# Patient Record
Sex: Female | Born: 1945
Health system: Southern US, Community
[De-identification: ages and names within clinical notes are randomized; demographics above are authoritative.]

## PROBLEM LIST (undated history)

## (undated) DIAGNOSIS — I639 Cerebral infarction, unspecified: Secondary | ICD-10-CM

## (undated) DIAGNOSIS — H3530 Unspecified macular degeneration: Secondary | ICD-10-CM

## (undated) DIAGNOSIS — D649 Anemia, unspecified: Secondary | ICD-10-CM

## (undated) DIAGNOSIS — I10 Essential (primary) hypertension: Secondary | ICD-10-CM

## (undated) DIAGNOSIS — I509 Heart failure, unspecified: Secondary | ICD-10-CM

## (undated) DIAGNOSIS — E876 Hypokalemia: Secondary | ICD-10-CM

## (undated) HISTORY — PX: CHOLECYSTECTOMY: SHX55

## (undated) HISTORY — PX: REFRACTIVE SURGERY: SHX103

## (undated) HISTORY — PX: EYE SURGERY: SHX253

## (undated) HISTORY — PX: ROTATOR CUFF REPAIR: SHX139

---

## 1999-01-27 ENCOUNTER — Emergency Department (HOSPITAL_COMMUNITY): Admission: EM | Admit: 1999-01-27 | Discharge: 1999-01-27 | Payer: Self-pay | Admitting: Emergency Medicine

## 1999-06-17 ENCOUNTER — Ambulatory Visit (HOSPITAL_COMMUNITY): Admission: RE | Admit: 1999-06-17 | Discharge: 1999-06-17 | Payer: Self-pay | Admitting: Internal Medicine

## 1999-06-17 ENCOUNTER — Encounter: Payer: Self-pay | Admitting: Internal Medicine

## 2000-09-11 ENCOUNTER — Other Ambulatory Visit: Admission: RE | Admit: 2000-09-11 | Discharge: 2000-09-11 | Payer: Self-pay | Admitting: Internal Medicine

## 2000-09-14 ENCOUNTER — Encounter: Admission: RE | Admit: 2000-09-14 | Discharge: 2000-09-14 | Payer: Self-pay | Admitting: Internal Medicine

## 2000-09-14 ENCOUNTER — Encounter: Payer: Self-pay | Admitting: Internal Medicine

## 2005-01-14 ENCOUNTER — Encounter: Admission: RE | Admit: 2005-01-14 | Discharge: 2005-01-14 | Payer: Self-pay | Admitting: Obstetrics and Gynecology

## 2005-01-31 ENCOUNTER — Encounter (INDEPENDENT_AMBULATORY_CARE_PROVIDER_SITE_OTHER): Payer: Self-pay | Admitting: Specialist

## 2005-01-31 ENCOUNTER — Ambulatory Visit (HOSPITAL_COMMUNITY): Admission: RE | Admit: 2005-01-31 | Discharge: 2005-01-31 | Payer: Self-pay | Admitting: Gastroenterology

## 2007-02-07 ENCOUNTER — Ambulatory Visit: Payer: Self-pay | Admitting: Vascular Surgery

## 2007-02-07 ENCOUNTER — Encounter: Payer: Self-pay | Admitting: Internal Medicine

## 2007-02-07 ENCOUNTER — Ambulatory Visit: Admission: RE | Admit: 2007-02-07 | Discharge: 2007-02-07 | Payer: Self-pay | Admitting: Internal Medicine

## 2009-03-31 ENCOUNTER — Ambulatory Visit (HOSPITAL_COMMUNITY): Admission: RE | Admit: 2009-03-31 | Discharge: 2009-03-31 | Payer: Self-pay | Admitting: Internal Medicine

## 2010-12-24 NOTE — Op Note (Signed)
[name], [name]               ACCOUNT NO.:  [id number]   MEDICAL RECORD NO.:  [id number]          PATIENT TYPE:  AMB   LOCATION:  ENDO                         FACILITY:  [facility]   PHYSICIAN:  Anselmo Rod, M.D.  DATE OF BIRTH:  06-17-1946   DATE OF PROCEDURE:  01/31/2005  DATE OF DISCHARGE:                                 OPERATIVE REPORT   PROCEDURE PERFORMED:  Colonoscopy with cold biopsies x16.   ENDOSCOPIST:  Anselmo Rod, M.D.   INSTRUMENT USED:  Olympus video colonoscope.   INDICATIONS FOR PROCEDURE:  A 65 year old female undergoing a screening  colonoscopy to rule out colonic polyps, masses, etc.   PREPROCEDURE PREPARATION:  Informed consent was procured from the patient.  The patient fasted for eight hours prior to the procedure and prepped with a  bottle of magnesium citrate and a gallon of GoLYTELY the night prior to the  procedure.  Risks and benefits of the procedure including a 10% miss rate of  cancer and polyps was discussed with the patient as well.   PREPROCEDURE PHYSICAL:  VITAL SIGNS:  Stable vital signs.  NECK:  Supple.  CHEST:  Clear to auscultation.  CARDIOVASCULAR:  S1 and S2 regular.  ABDOMEN:  Soft with normal bowel sounds.   DESCRIPTION OF PROCEDURE:  The patient was placed in left lateral decubitus  position, sedated with 60 mg of Demerol and 6 mg of Versed in slow  incremental doses.  Once the patient was adequately sedated and maintained  on low flow oxygen and continuous cardiac monitoring, the Olympus video  colonoscope was advanced from the rectum to the cecum.  The appendiceal  orifice and ileocecal valve were clearly visualized and photographed.  There  was some residual stool in the colon.  Multiple washings were done and eight  small sessile polyps were biopsied from the cecum (cold biopsies) and eight  small sessile polyps were from the rectosigmoid colon.  No diverticular  erosions or ulcerations were noted.  Retroflexion in the  rectum revealed  small internal hemorrhoids.  The patient tolerated the procedure well  without complications.   IMPRESSION:  1.  Multiple small sessile polyps removed from the cecum and from the      rectosigmoid colon by cold biopsy forceps.  2.  Small internal hemorrhoids.  3.  No evidence of diverticulosis.   RECOMMENDATIONS:  1.  Await pathology results.  2.  Avoid nonsteroidals including aspirin for the next two weeks.  3.  Repeat colonoscopy depending on pathology results.  4.  Outpatient follow-up as need arises in the future.       JNM/MEDQ  D:  01/31/2005  T:  01/31/2005  Job:  [id number]   cc:   Lindaann Slough, M.D.  [address]  [place]  [place] [address]  Fax: [phone]   Margaretmary Bayley, M.D.  [address], [address]  [place]  [place] [address]  Fax: [phone]   Arlyce Harman  [address]  [place]  [place] [address]  Fax: [phone]

## 2011-12-07 DIAGNOSIS — I639 Cerebral infarction, unspecified: Secondary | ICD-10-CM

## 2011-12-07 HISTORY — DX: Cerebral infarction, unspecified: I63.9

## 2011-12-12 ENCOUNTER — Other Ambulatory Visit: Payer: Self-pay | Admitting: Obstetrics and Gynecology

## 2011-12-12 ENCOUNTER — Other Ambulatory Visit (HOSPITAL_COMMUNITY)
Admission: RE | Admit: 2011-12-12 | Discharge: 2011-12-12 | Disposition: A | Discharge status: Home / Self Care | Payer: BC Managed Care – PPO | Source: Ambulatory Visit | Attending: Obstetrics and Gynecology | Admitting: Obstetrics and Gynecology

## 2011-12-12 DIAGNOSIS — Z124 Encounter for screening for malignant neoplasm of cervix: Secondary | ICD-10-CM | POA: Insufficient documentation

## 2011-12-12 DIAGNOSIS — Z1231 Encounter for screening mammogram for malignant neoplasm of breast: Secondary | ICD-10-CM

## 2012-01-03 ENCOUNTER — Emergency Department (HOSPITAL_COMMUNITY): Payer: BC Managed Care – PPO

## 2012-01-03 ENCOUNTER — Observation Stay (HOSPITAL_COMMUNITY)
Admission: EM | Admit: 2012-01-03 | Discharge: 2012-01-04 | Disposition: A | Discharge status: Home / Self Care | Payer: BC Managed Care – PPO | Attending: Internal Medicine | Admitting: Internal Medicine

## 2012-01-03 ENCOUNTER — Inpatient Hospital Stay (HOSPITAL_COMMUNITY): Payer: BC Managed Care – PPO

## 2012-01-03 ENCOUNTER — Encounter (HOSPITAL_COMMUNITY): Payer: Self-pay | Admitting: *Deleted

## 2012-01-03 ENCOUNTER — Other Ambulatory Visit: Payer: Self-pay

## 2012-01-03 DIAGNOSIS — I639 Cerebral infarction, unspecified: Secondary | ICD-10-CM | POA: Insufficient documentation

## 2012-01-03 DIAGNOSIS — I6350 Cerebral infarction due to unspecified occlusion or stenosis of unspecified cerebral artery: Principal | ICD-10-CM | POA: Insufficient documentation

## 2012-01-03 DIAGNOSIS — R059 Cough, unspecified: Secondary | ICD-10-CM | POA: Insufficient documentation

## 2012-01-03 DIAGNOSIS — Z794 Long term (current) use of insulin: Secondary | ICD-10-CM | POA: Insufficient documentation

## 2012-01-03 DIAGNOSIS — R29898 Other symptoms and signs involving the musculoskeletal system: Secondary | ICD-10-CM | POA: Insufficient documentation

## 2012-01-03 DIAGNOSIS — Z7982 Long term (current) use of aspirin: Secondary | ICD-10-CM | POA: Insufficient documentation

## 2012-01-03 DIAGNOSIS — E782 Mixed hyperlipidemia: Secondary | ICD-10-CM

## 2012-01-03 DIAGNOSIS — R0602 Shortness of breath: Secondary | ICD-10-CM | POA: Insufficient documentation

## 2012-01-03 DIAGNOSIS — I6340 Cerebral infarction due to embolism of unspecified cerebral artery: Secondary | ICD-10-CM

## 2012-01-03 DIAGNOSIS — I10 Essential (primary) hypertension: Secondary | ICD-10-CM

## 2012-01-03 DIAGNOSIS — E119 Type 2 diabetes mellitus without complications: Secondary | ICD-10-CM | POA: Insufficient documentation

## 2012-01-03 DIAGNOSIS — Z79899 Other long term (current) drug therapy: Secondary | ICD-10-CM | POA: Insufficient documentation

## 2012-01-03 DIAGNOSIS — R05 Cough: Secondary | ICD-10-CM | POA: Insufficient documentation

## 2012-01-03 HISTORY — DX: Essential (primary) hypertension: I10

## 2012-01-03 HISTORY — DX: Cerebral infarction, unspecified: I63.9

## 2012-01-03 LAB — COMPREHENSIVE METABOLIC PANEL
AST: 36 U/L (ref 0–37)
Albumin: 3.9 g/dL (ref 3.5–5.2)
BUN: 16 mg/dL (ref 6–23)
CO2: 27 mEq/L (ref 19–32)
Calcium: 9.7 mg/dL (ref 8.4–10.5)
Creatinine, Ser: 0.79 mg/dL (ref 0.50–1.10)
GFR calc non Af Amer: 85 mL/min — ABNORMAL LOW (ref >90)
Total Bilirubin: 0.8 mg/dL (ref 0.3–1.2)

## 2012-01-03 LAB — CBC
HCT: 38.1 % (ref 36.0–46.0)
MCH: 28.1 pg (ref 26.0–34.0)
MCV: 81.6 fL (ref 78.0–100.0)
Platelets: 289 10*3/uL (ref 150–400)
RDW: 13.5 % (ref 11.5–15.5)

## 2012-01-03 LAB — GLUCOSE, CAPILLARY: Glucose-Capillary: 125 mg/dL — ABNORMAL HIGH (ref 70–99)

## 2012-01-03 MED ORDER — ASPIRIN EC 81 MG PO TBEC
81.0000 mg | DELAYED_RELEASE_TABLET | Freq: Every day | ORAL | Status: DC
  Administered 2012-01-03 – 2012-01-04 (×2): 81 mg via ORAL
  Filled 2012-01-03 (×2): qty 1

## 2012-01-03 MED ORDER — SITAGLIPTIN PHOSPHATE 50 MG PO TABS
50.0000 mg | ORAL_TABLET | Freq: Two times a day (BID) | ORAL | Status: DC
  Administered 2012-01-04: 50 mg via ORAL
  Filled 2012-01-03 (×3): qty 1

## 2012-01-03 MED ORDER — ENOXAPARIN SODIUM 30 MG/0.3ML ~~LOC~~ SOLN
30.0000 mg | SUBCUTANEOUS | Status: DC
  Administered 2012-01-03: 30 mg via SUBCUTANEOUS
  Filled 2012-01-03 (×3): qty 0.3

## 2012-01-03 MED ORDER — METFORMIN HCL 500 MG PO TABS
1000.0000 mg | ORAL_TABLET | Freq: Two times a day (BID) | ORAL | Status: DC
  Administered 2012-01-04: 1000 mg via ORAL
  Filled 2012-01-03 (×3): qty 2

## 2012-01-03 MED ORDER — OLMESARTAN-AMLODIPINE-HCTZ 40-5-25 MG PO TABS: 1.0000 | ORAL_TABLET | Freq: Every day | ORAL | Status: DC

## 2012-01-03 MED ORDER — SITAGLIPTIN PHOS-METFORMIN HCL 50-1000 MG PO TABS: 1.0000 | ORAL_TABLET | Freq: Two times a day (BID) | ORAL | Status: DC

## 2012-01-03 MED ORDER — AMLODIPINE BESYLATE 5 MG PO TABS
5.0000 mg | ORAL_TABLET | Freq: Every day | ORAL | Status: DC
  Administered 2012-01-04: 5 mg via ORAL
  Filled 2012-01-03: qty 1

## 2012-01-03 MED ORDER — PANTOPRAZOLE SODIUM 40 MG IV SOLR
40.0000 mg | Freq: Once | INTRAVENOUS | Status: AC
  Administered 2012-01-03: 40 mg via INTRAVENOUS
  Filled 2012-01-03: qty 40

## 2012-01-03 MED ORDER — ASPIRIN 81 MG PO CHEW
CHEWABLE_TABLET | ORAL | Status: AC
  Filled 2012-01-03: qty 1

## 2012-01-03 MED ORDER — OLMESARTAN MEDOXOMIL 40 MG PO TABS
40.0000 mg | ORAL_TABLET | Freq: Every day | ORAL | Status: DC
  Administered 2012-01-04: 40 mg via ORAL
  Filled 2012-01-03: qty 1

## 2012-01-03 MED ORDER — ONDANSETRON HCL 4 MG/2ML IJ SOLN: 4.0000 mg | Freq: Four times a day (QID) | INTRAMUSCULAR | Status: DC | PRN

## 2012-01-03 MED ORDER — HYDROCHLOROTHIAZIDE 25 MG PO TABS
25.0000 mg | ORAL_TABLET | Freq: Every day | ORAL | Status: DC
  Administered 2012-01-04: 25 mg via ORAL
  Filled 2012-01-03: qty 1

## 2012-01-03 MED ORDER — DARIFENACIN HYDROBROMIDE ER 7.5 MG PO TB24
7.5000 mg | ORAL_TABLET | Freq: Every day | ORAL | Status: DC
  Administered 2012-01-04: 7.5 mg via ORAL
  Filled 2012-01-03: qty 1

## 2012-01-03 MED ORDER — INSULIN DETEMIR 100 UNIT/ML ~~LOC~~ SOLN
100.0000 [IU] | Freq: Every day | SUBCUTANEOUS | Status: DC
  Administered 2012-01-03: 100 [IU] via SUBCUTANEOUS
  Filled 2012-01-03: qty 10

## 2012-01-03 MED ORDER — SENNOSIDES-DOCUSATE SODIUM 8.6-50 MG PO TABS
1.0000 | ORAL_TABLET | Freq: Every evening | ORAL | Status: DC | PRN
Start: 2012-01-03 — End: 2012-01-04
  Filled 2012-01-03: qty 1

## 2012-01-03 NOTE — ED Provider Notes (Signed)
Medical screening examination/treatment/procedure(s) were conducted as a shared visit with non-physician practitioner(s) and myself.  I personally evaluated the patient during the encounter  Toy Baker, MD 01/03/12 2231

## 2012-01-03 NOTE — ED Notes (Signed)
Pt returned from MRI via stretcher.

## 2012-01-03 NOTE — ED Notes (Signed)
Pt still waiting for MRI, pt requesting food. rn checked with PA and pt must stay NPO until MRI results come back.

## 2012-01-03 NOTE — ED Notes (Signed)
Pt taken off 02 for trial, pt desat to 91%, pt placed back on 02 2L by [place] 99%

## 2012-01-03 NOTE — ED Notes (Signed)
Pt reports left sided facial droop x3 weeks. Came into to ED today because facial droop is not getting any better. Hx of bells palsy in 1998, pt unsure if bells palsy or if it was a mini stroke. Pt denies pain at this time. Noticeable facial droop. bil hand grips equal and strong. Leg pushes equal and strong.

## 2012-01-03 NOTE — H&P (Addendum)
PCP:  Laurena Slimmer, MD, MD   DOA:  01/03/2012  3:00 PM  Chief Complaint:  Left facial droop  HPI: Pt is 66 yo female with history of stroke in the past with residual right sided weakness who presents today with main concern of 2-3 week duration of left facial droop and difficulty with her speech. She reports being diagnosed with Bell's palsy in the past but the symptoms she is describing today are new. She denies any other specific symptoms, no chest pain or shortness of breath, no specific abdominal or urinary concerns, no headaches or visual changes, no fever, no chills, no recent sickness or hospitalization. Symptoms are not significantly improved.   Allergies: No Known Allergies  Prior to Admission medications   Medication Sig Start Date End Date Taking? Authorizing Provider  aspirin EC 81 MG tablet Take 81 mg by mouth daily.   Yes Historical Provider, MD  Cholecalciferol (VITAMIN D) 2000 UNITS tablet Take 2,000 Units by mouth daily.   Yes Historical Provider, MD  glimepiride (AMARYL) 4 MG tablet Take 4 mg by mouth 2 (two) times daily.   Yes Historical Provider, MD  insulin detemir (LEVEMIR) 100 UNIT/ML injection Inject 100 Units into the skin at bedtime.   Yes Historical Provider, MD  naproxen sodium (ANAPROX) 220 MG tablet Take 220 mg by mouth 2 (two) times daily as needed. For pain.   Yes Historical Provider, MD  Olmesartan-Amlodipine-HCTZ (TRIBENZOR) 40-5-25 MG TABS Take 1 tablet by mouth daily.   Yes Historical Provider, MD  sitaGLIPtan-metformin (JANUMET) 50-1000 MG per tablet Take 1 tablet by mouth 2 (two) times daily with a meal.   Yes Historical Provider, MD  solifenacin (VESICARE) 5 MG tablet Take 5 mg by mouth daily.   Yes Historical Provider, MD    Past Medical History  Diagnosis Date  . Stroke   . Diabetes mellitus   . Hypertension     Past Surgical History  Procedure Date  . Cholecystectomy   . Rotator cuff repair     left    Social History:  reports that  she quit smoking about 15 years ago. Her smoking use included Cigarettes. She has a 10 pack-year smoking history. She has never used smokeless tobacco. She reports that she does not drink alcohol or use illicit drugs.  History reviewed. No pertinent family history.  Review of Systems:  Constitutional: Denies fever, chills, diaphoresis, appetite change and fatigue.  HEENT: Denies photophobia, eye pain, redness, hearing loss, ear pain, congestion, sore throat, rhinorrhea, sneezing, mouth sores, trouble swallowing, neck pain, neck stiffness and tinnitus.   Respiratory: Denies SOB, DOE, cough, chest tightness,  and wheezing.   Cardiovascular: Denies chest pain, palpitations and leg swelling.  Gastrointestinal: Denies nausea, vomiting, abdominal pain, diarrhea, constipation, blood in stool and abdominal distention.  Genitourinary: Denies dysuria, urgency, frequency, hematuria, flank pain and difficulty urinating.  Musculoskeletal: Denies myalgias, back pain, joint swelling, arthralgias and gait problem.  Skin: Denies pallor, rash and wound.  Neurological: Denies dizziness, seizures, syncope, light-headedness, numbness and headaches.  Hematological: Denies adenopathy. Easy bruising, personal or family bleeding history  Psychiatric/Behavioral: Denies suicidal ideation, mood changes, confusion, nervousness, sleep disturbance and agitation   Physical Exam:  Filed Vitals:   01/03/12 1610 01/03/12 1820 01/03/12 1840 01/03/12 1847  BP: 147/69  143/69   Pulse: 74 73 69   Temp: 98.2 F (36.8 C)     TempSrc: Oral     Resp: 18 16 18    SpO2:  97%  93%  Constitutional: Vital signs reviewed.  Patient is in no acute distress and cooperative with exam. Alert and oriented x3.  Head: Normocephalic and atraumatic Ear: TM normal bilaterally Mouth: no erythema or exudates, MMM Eyes: PERRL, EOMI, conjunctivae normal, No scleral icterus.  Neck: Supple, Trachea midline normal ROM, No JVD, mass, thyromegaly,  or carotid bruit present.  Cardiovascular: RRR, S1 normal, S2 normal, no MRG, pulses symmetric and intact bilaterally Pulmonary/Chest: CTAB, no wheezes, rales, or rhonchi Abdominal: Soft. Non-tender, non-distended, bowel sounds are normal, no masses, organomegaly, or guarding present.  GU: no CVA tenderness Musculoskeletal: No joint deformities, erythema, or stiffness, ROM full and no nontender Ext: no edema and no cyanosis, pulses palpable bilaterally (DP and PT) Hematology: no cervical, inginal, or axillary adenopathy.  Neurological: A&O x3, Left facial droop, right upper extremity strength 4/5 and left upper extremity strength 5/5, sensation intact to soft touch bilaterally Skin: Warm, dry and intact. No rash, cyanosis, or clubbing.  Psychiatric: Normal mood and affect. speech and behavior is normal. Judgment and thought content normal. Cognition and memory are normal.   Labs on Admission:  Results for orders placed during the hospital encounter of 01/03/12 (from the past 48 hour(s))  CBC     Status: Normal   Collection Time   01/03/12  4:37 PM      Component Value Range Comment   WBC 7.8  4.0 - 10.5 (K/uL)    RBC 4.67  3.87 - 5.11 (MIL/uL)    Hemoglobin 13.1  12.0 - 15.0 (g/dL)    HCT 11.9  14.7 - 82.9 (%)    MCV 81.6  78.0 - 100.0 (fL)    MCH 28.1  26.0 - 34.0 (pg)    MCHC 34.4  30.0 - 36.0 (g/dL)    RDW 56.2  13.0 - 86.5 (%)    Platelets 289  150 - 400 (K/uL)   COMPREHENSIVE METABOLIC PANEL     Status: Abnormal   Collection Time   01/03/12  4:37 PM      Component Value Range Comment   Sodium 138  135 - 145 (mEq/L)    Potassium 4.5  3.5 - 5.1 (mEq/L) SLIGHT HEMOLYSIS   Chloride 101  96 - 112 (mEq/L)    CO2 27  19 - 32 (mEq/L)    Glucose, Bld 163 (*) 70 - 99 (mg/dL)    BUN 16  6 - 23 (mg/dL)    Creatinine, Ser 7.84  0.50 - 1.10 (mg/dL)    Calcium 9.7  8.4 - 10.5 (mg/dL)    Total Protein 7.1  6.0 - 8.3 (g/dL)    Albumin 3.9  3.5 - 5.2 (g/dL)    AST 36  0 - 37 (U/L) SLIGHT  HEMOLYSIS   ALT 39 (*) 0 - 35 (U/L)    Alkaline Phosphatase 70  39 - 117 (U/L)    Total Bilirubin 0.8  0.3 - 1.2 (mg/dL)    GFR calc non Af Amer 85 (*) >90 (mL/min)    GFR calc Af Amer >90  >90 (mL/min)     Radiological Exams on Admission:  MRI Brain 01/03/2012 IMPRESSION:  1. Acute-on-chronic right cerebral white matter (corona radiata) infarct. No mass effect or hemorrhage.  2. Advanced chronic small vessel disease.   Assessment/Plan  Stroke - please see the details on the MRI noted above - will admit the pt to telemetry floor for further evaluation and management - proceed with stroke work up - will obtain carotid dopplers, 2 D ECHO - PT/OT/SLP  evaluation - once passes bed side swallow evaluation start aspirin at full dose - check FLP, A1C - risk factor control  Diabetes - check A1C and will continue the home medication regimen  HTN - continue home medication regimen  DVT Prophylaxis - Lovenox  Code Status - Full  Education  - test results and diagnostic studies were discussed with patient  - patient erbalized the understanding - questions were answered at the bedside and contact information was provided for additional questions or concerns  Time Spent on Admission: Over 30 minutes  MAGICK-Torin Modica 01/03/2012, 9:16 PM  [facility] Pager # [phone] Main Office # [phone]

## 2012-01-03 NOTE — ED Provider Notes (Signed)
Medical screening examination/treatment/procedure(s) were conducted as a shared visit with non-physician practitioner(s) and myself.  I personally evaluated the patient during the encounter  Patient relates worsening left-sided facial droop with possible speech changes. History of prior stroke 15 years ago and has residual deficits from that. We'll check MRI today  Toy Baker, MD 01/03/12 1705

## 2012-01-03 NOTE — ED Provider Notes (Signed)
History     CSN: [id number]  Arrival date & time 01/03/12  1459   None     Chief Complaint  Patient presents with  . left sided facial droop, hx bells palsy     (Consider location/radiation/quality/duration/timing/severity/associated sxs/prior treatment) The history is provided by the patient. No language interpreter was used.  Pt states that she has had a worsening L facial droop x 3 weeks.  States that she feels like she is having difficulty with her speech in the last 2 weeks and she has bit her cheek several times.  States that in 1998 she went to [facility] and they told her she had Bells Palsy initially then they said she had a stroke.  Denies speech or memory problems.  MAE=, PEARL, Good coordination presently.  pmh of strike diabetes and hypertension.  Patient does smoke.  States that she has 3 grown children living with her and her husband and it is stressful.  Has taken 81mg  asa today.    Past Medical History  Diagnosis Date  . Stroke   . Diabetes mellitus   . Hypertension     Past Surgical History  Procedure Date  . Cholecystectomy   . Rotator cuff repair     left    No family history on file.  History  Substance Use Topics  . Smoking status: Current Everyday Smoker -- 0.5 packs/day for 20 years    Types: Cigarettes  . Smokeless tobacco: Not on file  . Alcohol Use: No    OB History    Grav Para Term Preterm Abortions TAB SAB Ect Mult Living                  Review of Systems  Constitutional: Negative.  Negative for fever.  HENT: Negative.  Negative for facial swelling.   Eyes: Negative.   Respiratory: Negative.  Negative for shortness of breath.   Cardiovascular: Negative.   Gastrointestinal: Negative.  Negative for abdominal pain.  Skin: Negative.   Neurological: Positive for facial asymmetry. Negative for dizziness, tremors, seizures, speech difficulty, weakness, light-headedness and headaches.       L facial droop    Psychiatric/Behavioral: Negative.   All other systems reviewed and are negative.    Allergies  Review of patient's allergies indicates no known allergies.  Home Medications   Current Outpatient Rx  Name Route Sig Dispense Refill  . ASPIRIN EC 81 MG PO TBEC Oral Take 81 mg by mouth daily.    Marland Kitchen VITAMIN D 2000 UNITS PO TABS Oral Take 2,000 Units by mouth daily.    Marland Kitchen GLIMEPIRIDE 4 MG PO TABS Oral Take 4 mg by mouth 2 (two) times daily.    . INSULIN DETEMIR 100 UNIT/ML  SOLN Subcutaneous Inject 100 Units into the skin at bedtime.    Marland Kitchen NAPROXEN SODIUM 220 MG PO TABS Oral Take 220 mg by mouth 2 (two) times daily as needed. For pain.    Marland Kitchen OLMESARTAN-AMLODIPINE-HCTZ 40-5-25 MG PO TABS Oral Take 1 tablet by mouth daily.    Marland Kitchen SITAGLIPTIN-METFORMIN HCL 50-1000 MG PO TABS Oral Take 1 tablet by mouth 2 (two) times daily with a meal.    . SOLIFENACIN SUCCINATE 5 MG PO TABS Oral Take 5 mg by mouth daily.      BP 147/69  Pulse 74  Temp(Src) 98.2 F (36.8 C) (Oral)  Resp 18  SpO2 92%  Physical Exam  Nursing note and vitals reviewed. Constitutional: She is oriented to person,  place, and time. She appears well-developed and well-nourished.  HENT:  Head: Normocephalic and atraumatic.  Eyes: Conjunctivae and EOM are normal. Pupils are equal, round, and reactive to light.  Neck: Normal range of motion. Neck supple.  Cardiovascular: Normal rate.   Pulmonary/Chest: Effort normal and breath sounds normal. No respiratory distress. She has no wheezes.  Abdominal: Soft.  Musculoskeletal: Normal range of motion. She exhibits no edema and no tenderness.  Neurological: She is alert and oriented to person, place, and time. She has normal strength and normal reflexes. She is not disoriented. A cranial nerve deficit is present. No sensory deficit. Coordination and gait normal. GCS eye subscore is 4. GCS verbal subscore is 5. GCS motor subscore is 6.  Skin: Skin is warm and dry.  Psychiatric: She has a  normal mood and affect.    ED Course  Procedures (including critical care time)   Labs Reviewed  CBC  COMPREHENSIVE METABOLIC PANEL   No results found.   No diagnosis found.    MDM   Here with worsening L facial droop x 3 weeks.    PMH of stroke with L facial droop.  MRI show white matter acute on chronic white matter stroke.  Report given to Dr. Freida Busman who will call neurology to admit patient.  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         Remi Haggard, NP 01/03/12 2038

## 2012-01-03 NOTE — ED Notes (Signed)
Pt in MRI at this time 

## 2012-01-03 NOTE — ED Notes (Signed)
[id number]<AV> Expected date:<BR> Expected time:<BR> Means of arrival:<BR> Comments:<BR> Same pt, but need to correct birthday and social

## 2012-01-03 NOTE — ED Notes (Signed)
md alerted of pts O2 stast on room air 90%, rn put pt on 2 L  and O2 stat 97%. Pt reports some shortness of breath upon exertion. md ordering chest xray.

## 2012-01-04 DIAGNOSIS — I10 Essential (primary) hypertension: Secondary | ICD-10-CM | POA: Insufficient documentation

## 2012-01-04 DIAGNOSIS — E782 Mixed hyperlipidemia: Secondary | ICD-10-CM

## 2012-01-04 DIAGNOSIS — I517 Cardiomegaly: Secondary | ICD-10-CM

## 2012-01-04 DIAGNOSIS — E119 Type 2 diabetes mellitus without complications: Secondary | ICD-10-CM | POA: Insufficient documentation

## 2012-01-04 DIAGNOSIS — I6340 Cerebral infarction due to embolism of unspecified cerebral artery: Secondary | ICD-10-CM

## 2012-01-04 DIAGNOSIS — I639 Cerebral infarction, unspecified: Secondary | ICD-10-CM | POA: Insufficient documentation

## 2012-01-04 LAB — CBC
Hemoglobin: 12.7 g/dL (ref 12.0–15.0)
Platelets: 251 10*3/uL (ref 150–400)
RBC: 4.51 MIL/uL (ref 3.87–5.11)
WBC: 7.3 10*3/uL (ref 4.0–10.5)

## 2012-01-04 LAB — BASIC METABOLIC PANEL
CO2: 24 mEq/L (ref 19–32)
Chloride: 103 mEq/L (ref 96–112)
Glucose, Bld: 112 mg/dL — ABNORMAL HIGH (ref 70–99)
Sodium: 137 mEq/L (ref 135–145)

## 2012-01-04 LAB — LIPID PANEL
Total CHOL/HDL Ratio: 3.6 RATIO
VLDL: 51 mg/dL — ABNORMAL HIGH (ref 0–40)

## 2012-01-04 LAB — HEMOGLOBIN A1C: Mean Plasma Glucose: 232 mg/dL — ABNORMAL HIGH (ref <117)

## 2012-01-04 MED ORDER — ENOXAPARIN SODIUM 40 MG/0.4ML ~~LOC~~ SOLN
40.0000 mg | SUBCUTANEOUS | Status: DC
Start: 2012-01-04 — End: 2012-01-04

## 2012-01-04 MED ORDER — ATORVASTATIN CALCIUM 20 MG PO TABS
20.0000 mg | ORAL_TABLET | Freq: Every day | ORAL | Status: DC
  Filled 2012-01-04: qty 1

## 2012-01-04 MED ORDER — ASPIRIN 325 MG PO TBEC: 325.0000 mg | DELAYED_RELEASE_TABLET | Freq: Every day | ORAL | Status: AC

## 2012-01-04 MED ORDER — ASPIRIN EC 325 MG PO TBEC: 325.0000 mg | DELAYED_RELEASE_TABLET | Freq: Every day | ORAL | Status: DC

## 2012-01-04 NOTE — Progress Notes (Signed)
*  PRELIMINARY RESULTS* Vascular Ultrasound Carotid Duplex (Doppler) has been completed.  Preliminary findings: Bilaterally no evidence of ICA stenosis with antegrade vertebral flow.  Farrel Demark RDMS 01/04/2012, 10:01 AM

## 2012-01-04 NOTE — Progress Notes (Signed)
*  PRELIMINARY RESULTS* Echocardiogram 2D Echocardiogram has been performed.  Glean Salen [facility] 01/04/2012, 11:03 AM

## 2012-01-04 NOTE — Evaluation (Signed)
Physical Therapy One Time Evaluation and d/c from acute PT Patient Details Name: Andrea [name] MRN: [id number] DOB: 11-10-1945 Today's Date: 01/04/2012 Time: 1478-2956 PT Time Calculation (min): 23 min  PT Assessment / Plan / Recommendation Clinical Impression  Pt admitted with acute on chronic R cerebral white matter infarct with hx of Bells Palsy.  Pt reports she feels fine and is currently at her baseline.  Pt able to ambulate in hallway without assistance and no balance issues.  Pt eager for d/c home and to get back to working [employer] for [employer]).    PT Assessment  Patent does not need any further PT services    Follow Up Recommendations  No PT follow up    Barriers to Discharge        lEquipment Recommendations  None recommended by PT    Recommendations for Other Services     Frequency      Precautions / Restrictions     Pertinent Vitals/Pain No pain      Mobility  Bed Mobility Bed Mobility: Supine to Sit Supine to Sit: 7: Independent Transfers Transfers: Sit to Stand;Stand to Sit Sit to Stand: 6: Modified independent (Device/Increase time) Stand to Sit: 6: Modified independent (Device/Increase time) Details for Transfer Assistance: pt stood up and tidied bed, answered phone, no LOB Ambulation/Gait Ambulation/Gait Assistance: 5: Supervision Ambulation Distance (Feet): 300 Feet Assistive device: None Ambulation/Gait Assistance Details: mild favoring of L LE however pt reports this is baseline Gait Pattern: Step-through pattern;Wide base of support Modified Rankin (Stroke Patients Only) Pre-Morbid Rankin Score: No significant disability Modified Rankin: No significant disability    Exercises     PT Diagnosis:    PT Problem List:   PT Treatment Interventions:     PT Goals    Visit Information  Last PT Received On: 01/04/12 Assistance Needed: +1    Subjective Data  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 for tasks assessed Left Upper Extremity Assessment LUE ROM/Strength/Tone: WFL for tasks assessed Right Lower Extremity Assessment RLE ROM/Strength/Tone: WFL for tasks assessed RLE Sensation: WFL - Light Touch Left Lower Extremity Assessment LLE ROM/Strength/Tone: Deficits LLE ROM/Strength/Tone Deficits: Pt reports weaker L LE since previous stroke however moves fully against gravity throughout LLE Sensation: WFL - Light Touch   Balance    End of Session PT - End of Session Activity Tolerance: Patient tolerated treatment well Patient left: in chair;with call bell/phone within reach   Ms [facility] E 01/04/2012, 11:53 AM Pager: [phone]

## 2012-01-04 NOTE — Progress Notes (Signed)
Subjective: Awake alert oriented. NAD. Denies pain discomfort. Reports left side of mouth feels "tight" but has improved since admission.   Objective: Vital signs Filed Vitals:   01/04/12 0200 01/04/12 0400 01/04/12 0616 01/04/12 0657  BP: 133/77 121/79 125/79 128/79  Pulse: 69 64 61 68  Temp: 97.8 F (36.6 C) 97.4 F (36.3 C) 98.5 F (36.9 C)   TempSrc: Oral Oral Oral   Resp: 18 18 18    Height:      Weight:      SpO2: 98% 95% 96%    Weight change:  Last BM Date: 01/02/12  Intake/Output from previous day:       Physical Exam: General: Alert, awake, oriented x3, in no acute distress. HEENT: No bruits, no goiter. PERRL Mucus membranes of mouth moist/pink. EOMI Heart: Regular rate and rhythm, without murmurs, rubs, gallops. No LEE Lungs: Normal effort. Breath sounds clear to auscultation bilaterally. No wheeze.  Abdomen: Obese, Soft, nontender, nondistended, positive bowel sounds. Extremities: No clubbing cyanosis or edema with positive pedal pulses. Neuro: Grossly intact, nonfocal. Slight droop to left side mouth. RUE strength 4/5 with LUE strength 5/5. LE strength 4/5 bilaterally. Speech clear. Sensation intact bilaterally.     Lab Results: Basic Metabolic Panel:  Basename 01/04/12 0415 01/03/12 1637  NA 137 138  K 3.8 4.5  CL 103 101  CO2 24 27  GLUCOSE 112* 163*  BUN 13 16  CREATININE 0.72 0.79  CALCIUM 9.2 9.7  MG -- --  PHOS -- --   Liver Function Tests:  Basename 01/03/12 1637  AST 36  ALT 39*  ALKPHOS 70  BILITOT 0.8  PROT 7.1  ALBUMIN 3.9   No results found for this basename: LIPASE:2,AMYLASE:2 in the last 72 hours No results found for this basename: AMMONIA:2 in the last 72 hours CBC:  Basename 01/04/12 0415 01/03/12 1637  WBC 7.3 7.8  NEUTROABS -- --  HGB 12.7 13.1  HCT 36.8 38.1  MCV 81.6 81.6  PLT 251 289   Cardiac Enzymes: No results found for this basename: CKTOTAL:3,CKMB:3,CKMBINDEX:3,TROPONINI:3 in the last 72 hours BNP: No  results found for this basename: PROBNP:3 in the last 72 hours D-Dimer: No results found for this basename: DDIMER:2 in the last 72 hours CBG:  Basename 01/04/12 0650 01/03/12 2204  GLUCAP 80 125*   Hemoglobin A1C: No results found for this basename: HGBA1C in the last 72 hours Fasting Lipid Panel:  Basename 01/04/12 0415  CHOL 153  HDL 42  LDLCALC 60  TRIG 257*  CHOLHDL 3.6  LDLDIRECT --   Thyroid Function Tests: No results found for this basename: TSH,T4TOTAL,FREET4,T3FREE,THYROIDAB in the last 72 hours Anemia Panel: No results found for this basename: VITAMINB12,FOLATE,FERRITIN,TIBC,IRON,RETICCTPCT in the last 72 hours Coagulation: No results found for this basename: LABPROT:2,INR:2 in the last 72 hours Urine Drug Screen: Drugs of Abuse  No results found for this basename: labopia,  cocainscrnur,  labbenz,  amphetmu,  thcu,  labbarb    Alcohol Level: No results found for this basename: ETH:2 in the last 72 hours Urinalysis: No results found for this basename: COLORURINE:2,APPERANCEUR:2,LABSPEC:2,PHURINE:2,GLUCOSEU:2,HGBUR:2,BILIRUBINUR:2,KETONESUR:2,PROTEINUR:2,UROBILINOGEN:2,NITRITE:2,LEUKOCYTESUR:2 in the last 72 hours Misc. Labs:  No results found for this or any previous visit (from the past 240 hour(s)).  Studies/Results: Ct Head Wo Contrast  01/03/2012  *RADIOLOGY REPORT*  Clinical Data: Left sided facial droop x3 weeks  CT HEAD WITHOUT CONTRAST  Technique:  Contiguous axial images were obtained from the base of the skull through the vertex without contrast.  Comparison: None.  Findings: Old lacunar infarct within the right putamen.  Extensive periventricular hypodensities compatible with microvascular ischemic disease.  Given background parenchymal abnormalities, there is no discrete CT evidence of acute large territory infarct. No interparenchymal or extra-axial mass or hemorrhage.  Normal size and configuration of the ventricles and basilar cisterns.  No midline  shift.  Vascular calcifications.  Regional soft tissues are normal.  No displaced calvarial fracture. Polypoid mucosal thickening within the right frontal sinus.  Remaining paranasal sinuses and mastoid air cells are normal.  Post right-sided cataract surgery.  IMPRESSION: Extensive microvascular ischemic disease without definite acute intracranial process.  Original Report Authenticated By: Waynard Reeds, M.D.   Mr Brain Wo Contrast  01/03/2012  *RADIOLOGY REPORT*  Clinical Data: 66 year old female with left facial droop.  History of Bell's palsy, hypertension, diabetes.  MRI HEAD WITHOUT CONTRAST  Technique:  Multiplanar, multiecho pulse sequences of the brain and surrounding structures were obtained according to standard protocol without intravenous contrast.  Comparison: Head CT without contrast 01/03/2012.  Findings: Confluent cerebral white matter T2 and FLAIR hyperintensity.  Superimposed area of restricted diffusion in the right corona radiata (series 4 image 20, series 400 image 20) entirely within the white matter.  No mass effect.  No acute hemorrhage.  Scattered chronic micro hemorrhages in the cerebral white matter. Major intracranial vascular flow voids are preserved.  Partially empty sella appearance.  No ventriculomegaly. No midline shift, mass effect, or evidence of mass lesion.  Chronic white matter infarct on the right tracking to the external capsule. Comparatively mild T2 heterogeneity in the deep gray matter nuclei. Negative brain stem, cerebellum, cervicomedullary junction, and visualized cervical spine.  Bone marrow signal is within normal limits.  Postoperative changes to the right globe. Visualized paranasal sinuses and mastoids are clear.  Grossly normal visualized internal auditory structures. Evidence of left scalp soft tissue scarring posteriorly.  Negative visualized face soft tissues.  IMPRESSION: 1.  Acute-on-chronic right cerebral white matter (corona radiata) infarct.  No mass  effect or hemorrhage. 2.  Advanced chronic small vessel disease.  Original Report Authenticated By: Harley Hallmark, M.D.   Dg Chest Portable 1 View  01/03/2012  *RADIOLOGY REPORT*  Clinical Data: 66 year old female with cough and shortness of breath.  PORTABLE CHEST - 1 VIEW  Comparison: 03/31/2009.  Findings: Portable upright AP view 1920 hours.  Stable lung volumes.  Cardiac size and mediastinal contours are within normal limits.  Visualized tracheal air column is within normal limits. No pneumothorax, pulmonary edema, pleural effusion or consolidation.  No definite acute pulmonary opacity.  IMPRESSION: No acute cardiopulmonary abnormality.  Original Report Authenticated By: Harley Hallmark, M.D.    Medications: Scheduled Meds:    . olmesartan  40 mg Oral Daily   And  . amLODipine  5 mg Oral Daily   And  . hydrochlorothiazide  25 mg Oral Daily  . aspirin      . aspirin EC  81 mg Oral Daily  . darifenacin  7.5 mg Oral Daily  . enoxaparin (LOVENOX) injection  40 mg Subcutaneous Q24H  . insulin detemir  100 Units Subcutaneous QHS  . sitaGLIPtin  50 mg Oral BID AC   And  . metFORMIN  1,000 mg Oral BID WC  . pantoprazole (PROTONIX) IV  40 mg Intravenous Once  . DISCONTD: enoxaparin  30 mg Subcutaneous Q24H  . DISCONTD: Olmesartan-Amlodipine-HCTZ  1 tablet Oral Daily  . DISCONTD: sitaGLIPtan-metformin  1 tablet Oral BID WC   Continuous Infusions:  PRN Meds:.ondansetron (ZOFRAN) IV, senna-docusate  Assessment/Plan:  Principal Problem:  *Stroke Active Problems:  Hypertension  Diabetes mellitus  Stroke  - please see the details on the MRI noted above. Symptoms improved. Carotid doppler prelim findings no evidence of ICA stenosis with antegrade vertebral flow. 2decho pending.  Await  PT/OT. evaluation . Continue  aspirin at full dose . Triglycerides 257, A1C pending. Will start statin.   Diabetes  -  A1C pending. CBG 80-125. Will continue the home medication regimen  HTN  -  continue home medication regimen . SBP range 128-133.  DVT Prophylaxis - Lovenox Dispostion. Home when ready. Maybe this afternoon.    LOS: 1 day   [facility] M 01/04/2012, 10:21 AM

## 2012-01-04 NOTE — Discharge Summary (Signed)
Patient ID: Andrea [name] MRN: [id number] DOB/AGE: June 29, 1946 66 y.o.  Admit date: 01/03/2012 Discharge date: 01/04/2012  Primary Care Physician:  Laurena Slimmer, MD, MD  Discharge Diagnoses:  Left facial droop secondary to stroke  Present on Admission:   Principal Problem:  *Stroke Active Problems:  Hypertension  Diabetes mellitus   Medication List  As of 01/04/2012  1:15 PM   TAKE these medications         aspirin 325 MG EC tablet   Take 1 tablet (325 mg total) by mouth daily.      glimepiride 4 MG tablet   Commonly known as: AMARYL   Take 4 mg by mouth 2 (two) times daily.      insulin detemir 100 UNIT/ML injection   Commonly known as: LEVEMIR   Inject 100 Units into the skin at bedtime.      naproxen sodium 220 MG tablet   Commonly known as: ANAPROX   Take 220 mg by mouth 2 (two) times daily as needed. For pain.      sitaGLIPtan-metformin 50-1000 MG per tablet   Commonly known as: JANUMET   Take 1 tablet by mouth 2 (two) times daily with a meal.      solifenacin 5 MG tablet   Commonly known as: VESICARE   Take 5 mg by mouth daily.      TRIBENZOR 40-5-25 MG Tabs   Generic drug: Olmesartan-Amlodipine-HCTZ   Take 1 tablet by mouth daily.      Vitamin D 2000 UNITS tablet   Take 2,000 Units by mouth daily.            Disposition and Follow-up: With PCP in 4 weeks  Consults:  none  Significant Diagnostic Studies:   MRI brain [id number] IMPRESSION:  1. Acute-on-chronic right cerebral white matter (corona radiata) infarct. No mass effect or hemorrhage.  2. Advanced chronic small vessel disease.  Brief H and P: Pt is 66 yo female with history of stroke in the past with residual right sided weakness who presents today with main concern of 2-3 week duration of left facial droop and difficulty with her speech. She reports being diagnosed with Bell's palsy in the past but the symptoms she is describing today are new. She denies any other specific  symptoms, no chest pain or shortness of breath, no specific abdominal or urinary concerns, no headaches or visual changes, no fever, no chills, no recent sickness or hospitalization. Symptoms are not significantly improved.   Physical Exam on Discharge:  Filed Vitals:   01/04/12 0400 01/04/12 0616 01/04/12 0657 01/04/12 1015  BP: 121/79 125/79 128/79 129/77  Pulse: 64 61 68 66  Temp: 97.4 F (36.3 C) 98.5 F (36.9 C)  97.4 F (36.3 C)  TempSrc: Oral Oral  Oral  Resp: 18 18  16   Height:      Weight:      SpO2: 95% 96%  95%    No intake or output data in the 24 hours ending 01/04/12 1315  General: Alert, awake, oriented x3, in no acute distress. HEENT: No bruits, no goiter. Left facial droop much improved Heart: Regular rate and rhythm, without murmurs, rubs, gallops. Lungs: Clear to auscultation bilaterally. Abdomen: Soft, nontender, nondistended, positive bowel sounds. Extremities: No clubbing cyanosis or edema with positive pedal pulses. Neuro: Grossly intact, nonfocal.  CBC:    Component Value Date/Time   WBC 7.3 01/04/2012 0415   HGB 12.7 01/04/2012 0415   HCT 36.8 01/04/2012 0415   PLT  251 01/04/2012 0415   MCV 81.6 01/04/2012 0415    Basic Metabolic Panel:    Component Value Date/Time   NA 137 01/04/2012 0415   K 3.8 01/04/2012 0415   CL 103 01/04/2012 0415   CO2 24 01/04/2012 0415   BUN 13 01/04/2012 0415   CREATININE 0.72 01/04/2012 0415   GLUCOSE 112* 01/04/2012 0415   CALCIUM 9.2 01/04/2012 0415    Hospital Course:  Principal Problem:  *Stroke Active Problems:  Hypertension  Diabetes mellitus  Stroke, nonhemorrhagic and not tPA candidate  - please see the details on the MRI noted above  - we admitted the pt to telemetry floor for further evaluation and management  - we obtained carotid dopplers, 2 D ECHO and both were unremarkable for acute events - PT/OT/SLP evaluation done with no further recommendations - risk factor control HTN ,DM  recommended  Diabetes, uncontrolled with neuropathy  - A1C > 9 - discussed medication compliance  HTN  - continued home medication regimen  DVT Prophylaxis - Lovenox   Code Status - Full   Education  - test results and diagnostic studies were discussed with patient  - patient erbalized the understanding  - questions were answered at the bedside and contact information was provided for additional questions or concerns  Time spent on Discharge: Over 30 minutes  Signed: Debbora Presto 01/04/2012, 1:15 PM  [facility], pager #: [phone] Main office number: [phone]

## 2012-01-04 NOTE — Discharge Instructions (Addendum)
Andrea [name] the MRI show that you did not have a stroke. Follow up with Dr. Chestine Spore this week.  The chest x-ray and CT of the head were also normal tonight.  Resume your normal medications and return for any concerns.   Stroke (Cerebrovascular Accident) A stroke (cerebrovascular accident, CVA) means you have a brain injury from blocked circulation or bleeding in the brain. Blocked circulation usually comes from a clot. RISK FACTORS  High blood pressure (hypertension).   High cholesterol.   Diabetes.   Heart disease.   The buildup of fatty deposits in the blood vessels (peripheral artery disease or atherosclerosis).   An abnormal heart rhythm (atrial fibrillation).   Obesity.   Smoking.   Taking oral contraceptives (especially in combination with smoking).   Physical inactivity.   A diet high in fats, salt (sodium), and calories.   Alcohol use.   Use of illegal drugs (especially cocaine and methamphetamine).   Being a female.   Being an Tree surgeon.   Age over 64.   Family history of stroke.   Previous history of blood clots, a "warning stroke" (transient ischemic attack, TIA), or heart attack.   Sickle cell disease.  SYMPTOMS  The symptoms of a stroke depend on the part of the brain that is affected. It is important to seek treatment within 4 hours of the start of symptoms because you may receive a "clot dissolving" medication that cannot be given after that time. Even if you don't know when your symptoms began, get treatment as soon as possible. Symptoms of a stroke may progress or change over the first several days. Symptoms may include:  Sudden weakness or numbness of the face, arm, or leg, especially on one side of the body.   Sudden confusion.   Trouble speaking (aphasia) or understanding.   Sudden trouble seeing in one or both eyes.   Sudden trouble walking.   Dizziness.   Loss of balance or coordination.   Sudden severe headache with no known cause.    HOME CARE INSTRUCTIONS   Medicines: Aspirin and blood thinners may be used to prevent another stroke. Blood thinners need to be used exactly as instructed. Medicines may also be used to control risk factors for a stroke. Be sure you understand all your medicine instructions.   Diet: Certain diets may be prescribed to address high blood pressure, high cholesterol, diabetes, or obesity. A diet that includes 5 or more servings of fruits and vegetables a day may reduce the risk of stroke. Foods may need to be a special consistency (soft or pureed), or small bites may need to be taken in order to avoid aspirating or choking.   Maintain a healthy weight.   Stay physically active. It is recommended that you get at least 30 minutes of activity on most or all days.   Do not smoke.   Limit alcohol use.   Stop drug abuse.   Home safety: A safe home environment is important to reduce the risk of falls. Your caregiver may arrange for specialists to evaluate your home. Having grab bars in the bedroom and bathroom is often important. Your caregiver may arrange for special equipment to be used at home, such as raised toilets and a seat for the shower.   Physical, occupational, and speech therapy: Ongoing therapy may be needed to maximize your recovery after a stroke. If you have been advised to use a walker or a cane, use it at all times. Be sure to keep  your therapy appointments.   Follow all instructions for follow-up with your caregiver. This is VERY important. This includes any referrals, physical therapy, rehabilitation, and laboratory tests. Proper treatment also prevents another stroke from occurring.  SEEK IMMEDIATE MEDICAL CARE IF:   You have sudden weakness or numbness of the face, arm, or leg, especially on one side of the body.   You have sudden confusion.   You have trouble speaking (aphasia) or understanding.   You have sudden trouble seeing in one or both eyes.   You have sudden  trouble walking.   You have dizziness.   You have loss of balance or coordination.   You have a sudden, severe headache with no known cause.   You have a fever.   You are coughing or have difficulty breathing.   You have new chest pain, angina, or an irregular heartbeat.  Any of these symptoms may represent a serious problem that is an emergency. Do not wait to see if the symptoms will go away. Get medical help at once. Call your local emergency services (911 in [place] Do not drive yourself to the hospital. Document Released: 07/25/2005 Document Revised: 02/07/2011 Document Reviewed: 12/23/2009 [facility] Patient Information 2012 [place], [place].

## 2012-01-04 NOTE — Care Management Note (Signed)
    Page 1 of 1   01/04/2012     2:40:59 PM   CARE MANAGEMENT NOTE 01/04/2012  Patient:  Andrea [name], Andrea [name]   Account Number:  [id number]  Date Initiated:  01/04/2012  Documentation initiated by:  [facility]  Subjective/Objective Assessment:   ADMITTED W/L FACIAL DROOP.HX:CVA.     Action/Plan:   FROM HOME W/SUPPORT.   Anticipated DC Date:  01/04/2012   Anticipated DC Plan:  HOME/SELF CARE      DC Planning Services  CM consult      Choice offered to / List presented to:             Status of service:  Completed, signed off Medicare Important Message given?   (If response is "NO", the following Medicare IM given date fields will be blank) Date Medicare IM given:   Date Additional Medicare IM given:    Discharge Disposition:  HOME/SELF CARE  Per UR Regulation:  Reviewed for med. necessity/level of care/duration of stay  If discussed at Long Length of Stay Meetings, dates discussed:    Comments:  01/04/12 Hatice Bubel RN,BSN NCM [phone] PT-NA. NO D/C NEEDS.

## 2012-01-04 NOTE — Progress Notes (Signed)
Pt arrived on unit and has settled in for the night.  No neuro deficits noted as this point, VSS, family has been at bedside.  Will continue to monitor.

## 2012-01-05 ENCOUNTER — Ambulatory Visit
Admission: RE | Admit: 2012-01-05 | Discharge: 2012-01-05 | Disposition: A | Discharge status: Home / Self Care | Payer: BC Managed Care – PPO | Source: Ambulatory Visit | Attending: Obstetrics and Gynecology | Admitting: Obstetrics and Gynecology

## 2012-01-05 DIAGNOSIS — Z1231 Encounter for screening mammogram for malignant neoplasm of breast: Secondary | ICD-10-CM

## 2012-01-05 LAB — GLUCOSE, CAPILLARY
Glucose-Capillary: 290 mg/dL — ABNORMAL HIGH (ref 70–99)
Glucose-Capillary: 244 mg/dL — ABNORMAL HIGH (ref 70–99)

## 2012-01-05 NOTE — Progress Notes (Signed)
Please see dictated discharge summary  MAGICK-Andrea [name] [facility], pager #: [phone] Main office number: [phone]  

## 2012-01-06 LAB — GLUCOSE, CAPILLARY: Glucose-Capillary: 92 mg/dL (ref 70–99)

## 2012-01-10 MED FILL — Insulin Detemir Inj 100 Unit/ML: SUBCUTANEOUS | Qty: 10 | Status: AC

## 2015-10-06 DIAGNOSIS — E109 Type 1 diabetes mellitus without complications: Secondary | ICD-10-CM | POA: Diagnosis not present

## 2015-10-06 DIAGNOSIS — E119 Type 2 diabetes mellitus without complications: Secondary | ICD-10-CM | POA: Diagnosis not present

## 2015-10-06 DIAGNOSIS — I2510 Atherosclerotic heart disease of native coronary artery without angina pectoris: Secondary | ICD-10-CM | POA: Diagnosis not present

## 2015-10-06 DIAGNOSIS — K219 Gastro-esophageal reflux disease without esophagitis: Secondary | ICD-10-CM | POA: Diagnosis not present

## 2015-10-06 DIAGNOSIS — I10 Essential (primary) hypertension: Secondary | ICD-10-CM | POA: Diagnosis not present

## 2015-10-21 DIAGNOSIS — L602 Onychogryphosis: Secondary | ICD-10-CM | POA: Diagnosis not present

## 2015-10-21 DIAGNOSIS — M7741 Metatarsalgia, right foot: Secondary | ICD-10-CM | POA: Diagnosis not present

## 2015-10-21 DIAGNOSIS — L84 Corns and callosities: Secondary | ICD-10-CM | POA: Diagnosis not present

## 2015-10-21 DIAGNOSIS — E1351 Other specified diabetes mellitus with diabetic peripheral angiopathy without gangrene: Secondary | ICD-10-CM | POA: Diagnosis not present

## 2015-10-21 DIAGNOSIS — I70293 Other atherosclerosis of native arteries of extremities, bilateral legs: Secondary | ICD-10-CM | POA: Diagnosis not present

## 2015-11-18 ENCOUNTER — Other Ambulatory Visit: Payer: Self-pay | Admitting: Family

## 2015-12-14 DIAGNOSIS — S90211A Contusion of right great toe with damage to nail, initial encounter: Secondary | ICD-10-CM | POA: Diagnosis not present

## 2015-12-17 ENCOUNTER — Encounter: Payer: Self-pay | Admitting: *Deleted

## 2015-12-17 DIAGNOSIS — I119 Hypertensive heart disease without heart failure: Secondary | ICD-10-CM | POA: Insufficient documentation

## 2015-12-18 ENCOUNTER — Encounter: Payer: Self-pay | Admitting: Interventional Cardiology

## 2015-12-18 ENCOUNTER — Ambulatory Visit (INDEPENDENT_AMBULATORY_CARE_PROVIDER_SITE_OTHER): Payer: Medicare Other | Admitting: Interventional Cardiology

## 2015-12-18 VITALS — BP 140/78 | HR 64 | Ht 61.0 in | Wt 167.8 lb

## 2015-12-18 DIAGNOSIS — E1159 Type 2 diabetes mellitus with other circulatory complications: Secondary | ICD-10-CM

## 2015-12-18 DIAGNOSIS — I119 Hypertensive heart disease without heart failure: Secondary | ICD-10-CM

## 2015-12-18 DIAGNOSIS — R0600 Dyspnea, unspecified: Secondary | ICD-10-CM

## 2015-12-18 DIAGNOSIS — R0789 Other chest pain: Secondary | ICD-10-CM | POA: Diagnosis not present

## 2015-12-18 MED ORDER — NITROGLYCERIN 0.4 MG SL SUBL: 0.4000 mg | SUBLINGUAL_TABLET | SUBLINGUAL | Status: DC | PRN

## 2015-12-18 MED ORDER — ROSUVASTATIN CALCIUM 10 MG PO TABS: 10.0000 mg | ORAL_TABLET | Freq: Every day | ORAL | Status: DC

## 2015-12-18 MED ORDER — METOPROLOL SUCCINATE ER 50 MG PO TB24: 50.0000 mg | ORAL_TABLET | Freq: Every day | ORAL | Status: DC

## 2015-12-18 NOTE — Progress Notes (Signed)
Cardiology Office Note   Date:  12/18/2015   ID:  SHAUNTA [name], DOB Nov 24, 1945, MRN [id number]  PCP:  Laurena Slimmer, MD  Cardiologist:  Lesleigh Noe, MD   Chief Complaint  Patient presents with  . Shortness of Breath      History of Present Illness: Andrea [name] is a 70 y.o. 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 For the past 6-8 months she has experienced chest pressure with moderate physical activity and accompanying dyspnea. 2 days ago while grocery shopping, pushing the cart to her car was associated with chest pressure left arm discomfort. The discomfort was significant. It resolved in 3-5 minutes and was [place] for she arrived home. She has not had a recurrence since that time. There is no history of heart disease.  She does have a personal history of vascular disease with prior microvascular stroke in 2013. She has been diabetic since 1993.    Past Medical History  Diagnosis Date  . Stroke (HCC)   . Diabetes mellitus   . Hypertension     Past Surgical History  Procedure Laterality Date  . Cholecystectomy    . Rotator cuff repair      left     Current Outpatient Prescriptions  Medication Sig Dispense Refill  . aspirin 81 MG tablet Take 81 mg by mouth daily.    . Cholecalciferol (VITAMIN D) 2000 UNITS tablet Take 2,000 Units by mouth daily.    Marland Kitchen glimepiride (AMARYL) 4 MG tablet Take 4 mg by mouth 2 (two) times daily.    . insulin detemir (LEVEMIR) 100 UNIT/ML injection Inject 100 Units into the skin at bedtime.    . naproxen sodium (ANAPROX) 220 MG tablet Take 220 mg by mouth 2 (two) times daily as needed. For pain.    . Olmesartan-Amlodipine-HCTZ (TRIBENZOR) 40-5-25 MG TABS Take 1 tablet by mouth daily.    . sitaGLIPtan-metformin (JANUMET) 50-1000 MG per tablet Take 1 tablet by mouth 2 (two) times daily with a meal.    . solifenacin (VESICARE) 5 MG tablet  Take 5 mg by mouth daily.    . vitamin E 400 UNIT capsule Take 400 Units by mouth daily.     No current facility-administered medications for this visit.    Allergies:   Review of patient's allergies indicates no known allergies.    Social History:  The patient  reports that she quit smoking about 18 years ago. Her smoking use included Cigarettes. She has a 10 pack-year smoking history. She has never used smokeless tobacco. She reports that she does not drink alcohol or use illicit drugs.   Family History:  The patient's family history includes Heart attack in her brother and father; Hypertension in her brother, father, and mother; Stroke in her mother.    ROS:  Please see the history of present illness.   Otherwise, review of systems are positive for leg discomfort, difficulty with vision, and elevated blood sugars..   All other systems are reviewed and negative.    PHYSICAL EXAM: VS:  BP 140/78 mmHg  Pulse 64  Ht  (1.549 m)  Wt 167 lb 12.8 oz (76.114 kg)  BMI 31.72 kg/m2 , BMI Body mass index is 31.72 kg/(m^2). GEN: Well nourished, well developed, in no acute distress HEENT: normal Neck: no JVD, carotid bruits, or masses Cardiac: RRR.  There is no murmur or rub. There is  an S4 gallop. There is no edema. There is evidence of chronic venous stasis Respiratory:  clear to auscultation bilaterally, normal work of breathing. GI: soft, nontender, nondistended, + BS MS: no deformity or atrophy Skin: warm and dry, no rash Neuro:  Strength and sensation are intact Psych: euthymic mood, full affect   EKG:  EKG is ordered today. The ekg reveals normal sinus rhythm, left ventricular hypertrophy, left axis deviation. No evidence of infarction.   Recent Labs: No results found for requested labs within last 365 days.    Lipid Panel    Component Value Date/Time   CHOL 153 01/04/2012 0415   TRIG 257* 01/04/2012 0415   HDL 42 01/04/2012 0415   CHOLHDL 3.6 01/04/2012 0415   VLDL  51* 01/04/2012 0415   LDLCALC 60 01/04/2012 0415      Wt Readings from Last 3 Encounters:  12/18/15 167 lb 12.8 oz (76.114 kg)  01/03/12 173 lb 15.1 oz (78.9 kg)      Other studies Reviewed: Additional studies/ records that were reviewed today include: None. The findings include other than review of cardiac data from 2013 which revealed LVH, CT scan revealed microvascular CNS disease..    ASSESSMENT AND PLAN:  1. Type 2 diabetes mellitus with other circulatory complication (HCC) A1c levels have been running greater than 8.  2. Hypertensive heart disease without heart failure Left ventricular hypertrophy was noted on an echocardiogram 4 years ago and was moderately severe.  3. Chest discomfort Compatible with angina pectoris precipitated by low to moderate physical activity   4. Dyspnea Occurs with exertion and accompanying chest discomfort  Current medicines are reviewed at length with the patient today.  The patient has the following concerns regarding medicines: .  The following changes/actions have been instituted:    Lexiscan myocardial perfusion imaging to risk stratify. If moderate or high risk will need coronary angiography.  Aspirin 81 mg per day  Nitroglycerin 0.4 mg sublingually when necessary  Metoprolol succinate 50 mg daily  Report ER of prolonged pain is not relieved by nitroglycerin  Labs/ tests ordered today include:  No orders of the defined types were placed in this encounter.     Disposition:   FU with HS in 4 weeks  Signed, Lesleigh NoeHenry W Smith III, MD  12/18/2015 10:52 AM    [facility] [address], [place], [place]  [address] Phone: [phone]; Fax: [phone]

## 2015-12-18 NOTE — Patient Instructions (Addendum)
Your physician has recommended you make the following change in your medication:  1.) start metoprolol succinate (Toprol XL) 50 mg once daily 2.) start rosuvastatin (Crestor) 10 mg once daily 3.) start nitroglycerin--after 3-5 minutes of rest, if chest pressure is still present, place one tablet under your tongue.  Wait 5 minutes, if chest pressure still present place a second tablet under your tongue.  Wait five more minutes.  If chest pressure is still present, place a third tablet under your tongue and call 911 (EMS)  Your physician has requested that you have a lexiscan myoview. For further information please visit [email or web address]. Please follow instruction sheet, as given.  PLEASE SCHEDULE IN THE NEXT WEEK IF POSSIBLE  Your physician recommends that you schedule a follow-up appointment in: 4-6 weeks with Dr. Katrinka BlazingSmith or physician extender.

## 2015-12-22 ENCOUNTER — Telehealth (HOSPITAL_COMMUNITY): Payer: Self-pay | Admitting: *Deleted

## 2015-12-22 NOTE — Telephone Encounter (Signed)
Left message on voicemail in reference to upcoming appointment scheduled for 12/24/15. Phone number given for a call back so details instructions can be given. Andrea [name] W   

## 2015-12-23 ENCOUNTER — Telehealth (HOSPITAL_COMMUNITY): Payer: Self-pay | Admitting: Radiology

## 2015-12-23 ENCOUNTER — Telehealth (HOSPITAL_COMMUNITY): Payer: Self-pay | Admitting: *Deleted

## 2015-12-23 DIAGNOSIS — L602 Onychogryphosis: Secondary | ICD-10-CM | POA: Diagnosis not present

## 2015-12-23 DIAGNOSIS — E1351 Other specified diabetes mellitus with diabetic peripheral angiopathy without gangrene: Secondary | ICD-10-CM | POA: Diagnosis not present

## 2015-12-23 DIAGNOSIS — I70293 Other atherosclerosis of native arteries of extremities, bilateral legs: Secondary | ICD-10-CM | POA: Diagnosis not present

## 2015-12-23 NOTE — Telephone Encounter (Signed)
Left message on voicemail in reference to upcoming appointment scheduled for 12/24/15. Phone number given for a call back so details instructions can be given. Andrea [name]   

## 2015-12-23 NOTE — Telephone Encounter (Signed)
Patient given detailed instructions per Myocardial Perfusion Study Information Sheet for the test on 12/24/2015 at 12:00. Patient notified to arrive 15 minutes early and that it is imperative to arrive on time for appointment to keep from having the test rescheduled.  If you need to cancel or reschedule your appointment, please call the office within 24 hours of your appointment. Failure to do so may result in a cancellation of your appointment, and a $50 no show fee. Patient verbalized understanding.EHK

## 2015-12-24 ENCOUNTER — Ambulatory Visit (HOSPITAL_COMMUNITY): Payer: Medicare Other | Attending: Cardiology

## 2015-12-24 DIAGNOSIS — E1159 Type 2 diabetes mellitus with other circulatory complications: Secondary | ICD-10-CM | POA: Insufficient documentation

## 2015-12-24 DIAGNOSIS — R0789 Other chest pain: Secondary | ICD-10-CM | POA: Diagnosis not present

## 2015-12-24 DIAGNOSIS — Z8249 Family history of ischemic heart disease and other diseases of the circulatory system: Secondary | ICD-10-CM | POA: Diagnosis not present

## 2015-12-24 DIAGNOSIS — I119 Hypertensive heart disease without heart failure: Secondary | ICD-10-CM | POA: Diagnosis not present

## 2015-12-24 DIAGNOSIS — R0600 Dyspnea, unspecified: Secondary | ICD-10-CM | POA: Insufficient documentation

## 2015-12-24 LAB — MYOCARDIAL PERFUSION IMAGING
CHL CUP NUCLEAR SDS: 1
CHL CUP NUCLEAR SRS: 7
CHL CUP RESTING HR STRESS: 57 {beats}/min
CHL CUP STRESS STAGE 1 GRADE: 0 %
CHL CUP STRESS STAGE 1 HR: 57 {beats}/min
CHL CUP STRESS STAGE 2 HR: 56 {beats}/min
CHL CUP STRESS STAGE 3 HR: 69 {beats}/min
CHL CUP STRESS STAGE 3 SPEED: 0 mph
CHL CUP STRESS STAGE 4 DBP: 61 mmHg
CHL CUP STRESS STAGE 4 HR: 82 {beats}/min
CHL CUP STRESS STAGE 4 SBP: 124 mmHg
CHL CUP STRESS STAGE 5 DBP: 66 mmHg
CHL CUP STRESS STAGE 6 DBP: 77 mmHg
CHL CUP STRESS STAGE 6 GRADE: 0 %
CHL CUP STRESS STAGE 6 HR: 75 {beats}/min
CHL CUP STRESS STAGE 6 SPEED: 0 mph
Estimated workload: 1 METS
LHR: 0.3
LV sys vol: 32 mL
LVDIAVOL: 76 mL (ref 46–106)
NUC STRESS TID: 0.94
Peak BP: 124 mmHg
Peak HR: 82 {beats}/min
Percent of predicted max HR: 54 %
SSS: 8
Stage 1 Speed: 0 mph
Stage 2 Grade: 0 %
Stage 2 Speed: 0 mph
Stage 3 Grade: 0 %
Stage 4 Grade: 0 %
Stage 4 Speed: 0 mph
Stage 5 Grade: 0 %
Stage 5 HR: 80 {beats}/min
Stage 5 SBP: 121 mmHg
Stage 5 Speed: 0 mph
Stage 6 SBP: 132 mmHg

## 2015-12-24 MED ORDER — REGADENOSON 0.4 MG/5ML IV SOLN
0.4000 mg | Freq: Once | INTRAVENOUS | Status: AC
  Administered 2015-12-24: 0.4 mg via INTRAVENOUS

## 2015-12-24 MED ORDER — TECHNETIUM TC 99M TETROFOSMIN IV KIT
10.2000 | PACK | Freq: Once | INTRAVENOUS | Status: AC | PRN
  Administered 2015-12-24: 10 via INTRAVENOUS
  Filled 2015-12-24: qty 10

## 2015-12-24 MED ORDER — TECHNETIUM TC 99M TETROFOSMIN IV KIT
31.9000 | PACK | Freq: Once | INTRAVENOUS | Status: AC | PRN
  Administered 2015-12-24: 31.9 via INTRAVENOUS
  Filled 2015-12-24: qty 32

## 2016-01-13 NOTE — Progress Notes (Signed)
Cardiology Office Note    Date:  01/14/2016   ID:  Andrea [name], DOB October 21, 1945, MRN [id number]  PCP:  Laurena SlimmerLARK,PRESTON S, MD  Cardiologist: Lesleigh NoeHenry W Darryle Dennie III, MD   Chief Complaint  Patient presents with  . Coronary Artery Disease    History of Present Illness:  Andrea Mandrilamela I Mcdanel is a 70 y.o. female follow-up of angina pectoris. Dr. Durene CalHunter has diabetes mellitus, hypertension, and hyperlipidemia. She has had a prior stroke.  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 She has been relatively sedentary since cardiac evaluation begun. She was having chest discomfort compatible with angina with activity such as grocery shopping and unloading groceries from her car. Since starting therapy with beta blockers, she has had no recurrence of discomfort. She denies dyspnea. The myocardial perfusion study noted below was low risk/normal. We discussed the implications of this given her history with reference to matched ischemia in vascular territories. Given her history of vascular disease, prior stroke, diabetes, and other risk factors, it would be a mistake to assume that her anginal symptoms were noncardiac.    Past Medical History  Diagnosis Date  . Stroke (HCC)   . Diabetes mellitus   . Hypertension     Past Surgical History  Procedure Laterality Date  . Cholecystectomy    . Rotator cuff repair      left    Current Medications: Outpatient Prescriptions Prior to Visit  Medication Sig Dispense Refill  . aspirin 81 MG tablet Take 81 mg by mouth daily.    . Cholecalciferol (VITAMIN D) 2000 UNITS tablet Take 2,000 Units by mouth daily.    Marland Kitchen. glimepiride (AMARYL) 4 MG tablet Take 4 mg by mouth 2 (two) times daily.    . insulin detemir (LEVEMIR) 100 UNIT/ML injection Inject 100 Units into the skin at bedtime.    . metoprolol succinate (TOPROL-XL) 50 MG 24 hr tablet Take 1 tablet (50 mg total) by mouth daily. Take with or immediately following a  meal. 90 tablet 3  . naproxen sodium (ANAPROX) 220 MG tablet Take 220 mg by mouth 2 (two) times daily as needed. For pain.    . nitroGLYCERIN (NITROSTAT) 0.4 MG SL tablet Place 1 tablet (0.4 mg total) under the tongue every 5 (five) minutes as needed for chest pain. Take if chest pressure is present after 3-5 min of rest 25 tablet 3  . rosuvastatin (CRESTOR) 10 MG tablet Take 1 tablet (10 mg total) by mouth daily. 90 tablet 3  . sitaGLIPtan-metformin (JANUMET) 50-1000 MG per tablet Take 1 tablet by mouth 2 (two) times daily with a meal.    . vitamin E 400 UNIT capsule Take 400 Units by mouth daily.    . Olmesartan-Amlodipine-HCTZ (TRIBENZOR) 40-5-25 MG TABS Take 1 tablet by mouth daily.    . solifenacin (VESICARE) 5 MG tablet Take 5 mg by mouth daily.     No facility-administered medications prior to visit.     Allergies:   Review of patient's allergies indicates no known allergies.   Social History   Social History  . Marital Status: Married    Spouse Name: N/A  . Number of Children: N/A  . Years of Education: N/A   Social History Main Topics  . Smoking status: Former Smoker -- 0.50 packs/day for 20 years    Types: Cigarettes    Quit date: 01/02/1997  . Smokeless tobacco: Never Used  . Alcohol Use: No  . Drug Use: No  .  Sexual Activity: Not Asked   Other Topics Concern  . None   Social History Narrative     Family History:  The patient's family history includes Healthy in her brother; Heart attack in her brother and father; Hypertension in her brother, father, and mother; Stroke in her mother.   ROS:   Please see the history of present illness.    Still has some dysesthesias on her left side related to the prior stroke.  All other systems reviewed and are negative.   PHYSICAL EXAM:   VS:  BP 144/70 mmHg  Pulse 62  Ht  (1.549 m)  Wt 169 lb 12.8 oz (77.021 kg)  BMI 32.10 kg/m2   GEN: Well nourished, well developed, in no acute distress HEENT: normal Neck: no  JVD, carotid bruits, or masses Cardiac: RRR; no murmurs, rubs, or gallops,no edema  Respiratory:  clear to auscultation bilaterally, normal work of breathing GI: soft, nontender, nondistended, + BS MS: no deformity or atrophy Skin: warm and dry, no rash Neuro:  Alert and Oriented x 3, Strength and sensation are intact Psych: euthymic mood, full affect  Wt Readings from Last 3 Encounters:  01/14/16 169 lb 12.8 oz (77.021 kg)  12/18/15 167 lb 12.8 oz (76.114 kg)  01/03/12 173 lb 15.1 oz (78.9 kg)      Studies/Labs Reviewed:   EKG:  EKG  Is not repeated  Recent Labs: No results found for requested labs within last 365 days.   Lipid Panel    Component Value Date/Time   CHOL 153 01/04/2012 0415   TRIG 257* 01/04/2012 0415   HDL 42 01/04/2012 0415   CHOLHDL 3.6 01/04/2012 0415   VLDL 51* 01/04/2012 0415   LDLCALC 60 01/04/2012 0415    Additional studies/ records that were reviewed today include:  Myocardial perfusion imaging performed 12/24/15: Nuclear Stress Findings    Isotope administration Rest isotope was administered with an IV injection of 10.2 mCi technetium tetrofosmin. Rest SPECT images were obtained approximately 45 minutes post tracer injection. Stress isotope was administered with an IV injection of 31.9 mCi technetium tetrofosmin 20 seconds post IV Lexiscan administration. Stress SPECT images were obtained approximately 60 minutes post tracer injection.   Nuclear Measurements Study was gated.   Rest Perfusion Rest perfusion normal.   Stress Perfusion Stress perfusion normal.   Overall Study Impression Myocardial perfusion is normal. The study is normal. This is a low risk study. Overall left ventricular systolic function was normal. LV cavity size is normal. Nuclear stress EF: 57%.        ASSESSMENT:    1. CAD in native artery   2. Hypertensive heart disease without heart failure   3. Hyperlipidemia   4. Type 2 diabetes mellitus with other  circulatory complication (HCC)      PLAN:  In order of problems listed above:  1. Uptitrating beta blocker therapy has significantly improved the patient's episodes of angina. I'll add low-dose isosorbide mononitrate 30 mg daily. Clinical follow-up in 2-3 months. Resume physical activity and notify us if limiting angina. We discussed implications of the low risk myocardial perfusion study in terms of cardiovascular risk. There is less than 1% risk of acute ischemic event given this scenario. We'll continue antiplatelet therapy in the form of aspirin, lipid lowering, and anti-ischemic therapy. We did discuss coronary angiography and will perform this if her symptoms are not controlled on a good medical regimen, if she has anxiety concerning the possibility of significant underlying CAD with matched  three-vessel ischemia, or any other clinical scenario that will warrant full anatomic definition. For the time being we have decided to defer the risk of angiography because she is doing so well on the therapy started. 2. Blood pressure is better controlled but we discussed her targets which should be 130/90 mmHg or less. 3. We discussed LDL target of 70. This will need to be monitored by either the primary care or this office.    Medication Adjustments/Labs and Tests Ordered: Current medicines are reviewed at length with the patient today.  Concerns regarding medicines are outlined above.  Medication changes, Labs and Tests ordered today are listed in the Patient Instructions below. Patient Instructions  Medication Instructions:  Your physician has recommended you make the following change in your medication:  1) Start Imdur 30 mg daily    Labwork: None ordered   Testing/Procedures: None ordered   Follow-Up: Your physician recommends that you schedule a follow-up appointment in: 2 months with Dr Katrinka Blazing   If you continue to have pain even after the start of this medication, please call the  office and we will get you set up for a heart catheterization    Any Other Special Instructions Will Be Listed Below (If Applicable).     If you need a refill on your cardiac medications before your next appointment, please call your pharmacy.       Signed, Lesleigh Noe, MD  01/14/2016 1:52 PM    [facility] [address] [place], [place], [place]  [address] Phone: [phone]; Fax: [phone]

## 2016-01-14 ENCOUNTER — Encounter: Payer: Self-pay | Admitting: Interventional Cardiology

## 2016-01-14 ENCOUNTER — Ambulatory Visit: Payer: Medicare Other | Admitting: Cardiology

## 2016-01-14 ENCOUNTER — Ambulatory Visit (INDEPENDENT_AMBULATORY_CARE_PROVIDER_SITE_OTHER): Payer: Medicare Other | Admitting: Interventional Cardiology

## 2016-01-14 VITALS — BP 144/70 | HR 62 | Ht 61.0 in | Wt 169.8 lb

## 2016-01-14 DIAGNOSIS — E785 Hyperlipidemia, unspecified: Secondary | ICD-10-CM | POA: Diagnosis not present

## 2016-01-14 DIAGNOSIS — E1159 Type 2 diabetes mellitus with other circulatory complications: Secondary | ICD-10-CM | POA: Diagnosis not present

## 2016-01-14 DIAGNOSIS — I119 Hypertensive heart disease without heart failure: Secondary | ICD-10-CM

## 2016-01-14 DIAGNOSIS — I2510 Atherosclerotic heart disease of native coronary artery without angina pectoris: Secondary | ICD-10-CM | POA: Diagnosis not present

## 2016-01-14 MED ORDER — ISOSORBIDE MONONITRATE ER 30 MG PO TB24: 30.0000 mg | ORAL_TABLET | Freq: Every day | ORAL | Status: DC

## 2016-01-14 NOTE — Patient Instructions (Signed)
Medication Instructions:  Your physician has recommended you make the following change in your medication:  1) Start Imdur 30 mg daily    Labwork: None ordered   Testing/Procedures: None ordered   Follow-Up: Your physician recommends that you schedule a follow-up appointment in: 2 months with Dr Katrinka BlazingSmith   If you continue to have pain even after the start of this medication, please call the office and we will get you set up for a heart catheterization    Any Other Special Instructions Will Be Listed Below (If Applicable).     If you need a refill on your cardiac medications before your next appointment, please call your pharmacy.

## 2016-02-08 ENCOUNTER — Telehealth: Payer: Self-pay | Admitting: Interventional Cardiology

## 2016-02-08 DIAGNOSIS — H5700 Unspecified anomaly of pupillary function: Secondary | ICD-10-CM | POA: Diagnosis not present

## 2016-02-08 DIAGNOSIS — H5010 Unspecified exotropia: Secondary | ICD-10-CM | POA: Diagnosis not present

## 2016-02-08 DIAGNOSIS — H269 Unspecified cataract: Secondary | ICD-10-CM | POA: Diagnosis not present

## 2016-02-08 DIAGNOSIS — H52203 Unspecified astigmatism, bilateral: Secondary | ICD-10-CM | POA: Diagnosis not present

## 2016-02-08 DIAGNOSIS — E119 Type 2 diabetes mellitus without complications: Secondary | ICD-10-CM | POA: Diagnosis not present

## 2016-02-08 DIAGNOSIS — Z83511 Family history of glaucoma: Secondary | ICD-10-CM | POA: Diagnosis not present

## 2016-02-08 DIAGNOSIS — Z961 Presence of intraocular lens: Secondary | ICD-10-CM | POA: Diagnosis not present

## 2016-02-08 NOTE — Telephone Encounter (Signed)
Try taking IMDUR at bedtime.

## 2016-02-08 NOTE — Telephone Encounter (Signed)
New message      Pt c/o medication issue:  1. Name of Medication: Isosorbide  2. How are you currently taking this medication (dosage and times per day)? 30 mg po daily  3. Are you having a reaction (difficulty breathing--STAT)? yes  4. What is your medication issue? The pt states she stop taking the medication last Thursday, the pt states she was having dizziness, headaches

## 2016-02-08 NOTE — Telephone Encounter (Signed)
New message      The pt thought they were having a reaction to the medication    Pt c/o medication issue:  1. Name of Medication: Isosorbide 2. How are you currently taking this medication (dosage and times per day)? 30 mg po daily  3. Are you having a reaction (difficulty breathing--STAT)? yes  4. What is your medication issue? The pt stated she stop taking the medication last Thursday, she was having dizziness and headache

## 2016-02-08 NOTE — Telephone Encounter (Signed)
Pt aware of Dr.Smith's response. Adv pt to call back if ant issues. Try taking Imdur at bedtime. Pt agreeable and verbalized understanding.

## 2016-02-08 NOTE — Telephone Encounter (Signed)
Returned pt call.[email or web address] 

## 2016-02-08 NOTE — Telephone Encounter (Signed)
Spoke with pt. Pt sts that she was unable to tolerate Imdur and d/c taking the medication 1 week ago. Pt was prescribed Imdur 30mg  by Dr.Smith on 6/8. Pt sts that she was experiencing headaches and dizziness. Pt sts that the headaches were tolerable, but she could not deal with the dizzy spells. Pt did not report any episodes of angina. Pt not sure if she should medication at a lower dosage or if she should try taking it at night. Adv pt that I will fwd Dr.Smith an update and cal back with his recommendation. Pt agreeable and verbalized understanding.

## 2016-02-11 DIAGNOSIS — H269 Unspecified cataract: Secondary | ICD-10-CM | POA: Diagnosis not present

## 2016-02-11 DIAGNOSIS — H25042 Posterior subcapsular polar age-related cataract, left eye: Secondary | ICD-10-CM | POA: Diagnosis not present

## 2016-02-11 DIAGNOSIS — H25012 Cortical age-related cataract, left eye: Secondary | ICD-10-CM | POA: Diagnosis not present

## 2016-02-24 DIAGNOSIS — I70293 Other atherosclerosis of native arteries of extremities, bilateral legs: Secondary | ICD-10-CM | POA: Diagnosis not present

## 2016-02-24 DIAGNOSIS — L84 Corns and callosities: Secondary | ICD-10-CM | POA: Diagnosis not present

## 2016-02-24 DIAGNOSIS — E1351 Other specified diabetes mellitus with diabetic peripheral angiopathy without gangrene: Secondary | ICD-10-CM | POA: Diagnosis not present

## 2016-02-24 DIAGNOSIS — L602 Onychogryphosis: Secondary | ICD-10-CM | POA: Diagnosis not present

## 2016-02-25 DIAGNOSIS — E1165 Type 2 diabetes mellitus with hyperglycemia: Secondary | ICD-10-CM | POA: Diagnosis not present

## 2016-02-25 DIAGNOSIS — E119 Type 2 diabetes mellitus without complications: Secondary | ICD-10-CM | POA: Diagnosis not present

## 2016-02-25 DIAGNOSIS — M2550 Pain in unspecified joint: Secondary | ICD-10-CM | POA: Diagnosis not present

## 2016-02-25 DIAGNOSIS — I10 Essential (primary) hypertension: Secondary | ICD-10-CM | POA: Diagnosis not present

## 2016-02-25 DIAGNOSIS — R51 Headache: Secondary | ICD-10-CM | POA: Diagnosis not present

## 2016-03-09 DIAGNOSIS — E1165 Type 2 diabetes mellitus with hyperglycemia: Secondary | ICD-10-CM | POA: Diagnosis not present

## 2016-03-14 DIAGNOSIS — E785 Hyperlipidemia, unspecified: Secondary | ICD-10-CM | POA: Insufficient documentation

## 2016-03-14 NOTE — Progress Notes (Signed)
Cardiology Office Note    Date:  03/15/2016   ID:  Andrea Mandrilamela I [name], DOB 09-24-1945, MRN [id number]  PCP:  Laurena SlimmerLARK,PRESTON S, MD  Cardiologist: Lesleigh NoeHenry W Smith III, MD   Chief Complaint  Patient presents with  . Coronary Artery Disease    History of Present Illness:  Andrea [name] is a 70 y.o. female presents for follow-up of exertional chest pain compatible with angina, low risk myocardial perfusion study, diabetes mellitus type 2, hyperlipidemia, and essential hypertension follow-up.  Investigation with myocardial perfusion imaging as noted above he noted a low risk study. Anti-anginal therapy was started which included isosorbide mononitrate and metoprolol succinate. She subsequently discontinued those medications without consultation because of "dizziness". Since last being seen, she has had 2 episodes of angina, which occur mostly in the setting of emotional distress. Sublingual nitroglycerin relieves the discomfort. She denies orthopnea, PND, and palpitations.   Past Medical History:  Diagnosis Date  . Diabetes mellitus   . Hypertension   . Stroke [facility](HCC)     Past Surgical History:  Procedure Laterality Date  . CHOLECYSTECTOMY    . ROTATOR CUFF REPAIR     left    Current Medications: Outpatient Medications Prior to Visit  Medication Sig Dispense Refill  . aspirin 81 MG tablet Take 81 mg by mouth daily.    . Cholecalciferol (VITAMIN D) 2000 UNITS tablet Take 2,000 Units by mouth daily.    . insulin detemir (LEVEMIR) 100 UNIT/ML injection Inject 100 Units into the skin at bedtime.    . naproxen sodium (ANAPROX) 220 MG tablet Take 220 mg by mouth 2 (two) times daily as needed. For pain.    . nitroGLYCERIN (NITROSTAT) 0.4 MG SL tablet Place 1 tablet (0.4 mg total) under the tongue every 5 (five) minutes as needed for chest pain. Take if chest pressure is present after 3-5 min of rest 25 tablet 3  . Olmesartan-Amlodipine-HCTZ 40-10-25 MG TABS Take 1 tablet by mouth daily.  1    . ONETOUCH VERIO test strip Use as directed four times daily to test blood sugar level.  12  . sitaGLIPtan-metformin (JANUMET) 50-1000 MG per tablet Take 1 tablet by mouth 2 (two) times daily with a meal.    . VESICARE 10 MG tablet Take 10 mg by mouth daily.  5  . vitamin E 400 UNIT capsule Take 400 Units by mouth daily.    Marland Kitchen. glimepiride (AMARYL) 4 MG tablet Take 4 mg by mouth 2 (two) times daily.    . isosorbide mononitrate (IMDUR) 30 MG 24 hr tablet Take 1 tablet (30 mg total) by mouth daily. (Patient not taking: Reported on 03/15/2016) 90 tablet 3  . metoprolol succinate (TOPROL-XL) 50 MG 24 hr tablet Take 1 tablet (50 mg total) by mouth daily. Take with or immediately following a meal. (Patient not taking: Reported on 03/15/2016) 90 tablet 3  . rosuvastatin (CRESTOR) 10 MG tablet Take 1 tablet (10 mg total) by mouth daily. (Patient not taking: Reported on 03/15/2016) 90 tablet 3   No facility-administered medications prior to visit.      Allergies:   Review of patient's allergies indicates no known allergies.   Social History   Social History  . Marital status: Married    Spouse name: N/A  . Number of children: N/A  . Years of education: N/A   Social History Main Topics  . Smoking status: Former Smoker    Packs/day: 0.50    Years: 20.00    Types:  Cigarettes    Quit date: 01/02/1997  . Smokeless tobacco: Never Used  . Alcohol use No  . Drug use: No  . Sexual activity: Not Asked   Other Topics Concern  . None   Social History Narrative  . None     Family History:  The patient's family history includes Healthy in her brother; Heart attack in her brother and father; Hypertension in her brother, father, and mother; Stroke in her mother.   ROS:   Please see the history of present illness.    Left sided weakness due to prior stroke. Excessive fatigue especially after awakening each morning. She denies sleep disturbance and snoring. She awakens at least once per night to urinate.   All other systems reviewed and are negative.   PHYSICAL EXAM:   VS:  BP 124/70   Pulse 68   Ht  (1.549 m)   Wt 167 lb 3.2 oz (75.8 kg)   BMI 31.59 kg/m    GEN: Well nourished, well developed, in no acute distress  HEENT: normal  Neck: no JVD, carotid bruits, or masses Cardiac: RRR; no murmurs, rubs, or gallops,no edema  Respiratory:  clear to auscultation bilaterally, normal work of breathing GI: soft, nontender, nondistended, + BS MS: no deformity or atrophy  Skin: warm and dry, no rash Neuro:  Alert and Oriented x 3, Strength and sensation are intact Psych: euthymic mood, full affect  Wt Readings from Last 3 Encounters:  03/15/16 167 lb 3.2 oz (75.8 kg)  01/14/16 169 lb 12.8 oz (77 kg)  12/18/15 167 lb 12.8 oz (76.1 kg)      Studies/Labs Reviewed:   EKG:  EKG  Not repeated  Recent Labs: No results found for requested labs within last 8760 hours.   Lipid Panel    Component Value Date/Time   CHOL 153 01/04/2012 0415   TRIG 257 (H) 01/04/2012 0415   HDL 42 01/04/2012 0415   CHOLHDL 3.6 01/04/2012 0415   VLDL 51 (H) 01/04/2012 0415   LDLCALC 60 01/04/2012 0415    Additional studies/ records that were reviewed today include:  No new data    ASSESSMENT:    1. Chest discomfort   2. Essential hypertension   3. Hypertensive heart disease without heart failure   4. Hyperlipidemia   5. Type 2 diabetes mellitus with other circulatory complication (HCC)      PLAN:  In order of problems listed above:  1. Symptoms are compatible with angina and responsive to nitroglycerin. Myocardial perfusion imaging did not reveal a significant defect. This could represent matched three-vessel ischemia or small territory of involvement. I discussed this with the patient. For the time being we will assume a conservative approach. We counseled on nitroglycerin use. 2. Low salt diet and aerobic activity encouraged. 3. As above. 4. Discussed target of 74 LDL followed by  primary Dr. Margaretmary Bayley. 5. Not been particularly compliant with diabetes or heart therapies. A1c is still greater than 8.5.    Medication Adjustments/Labs and Tests Ordered: Current medicines are reviewed at length with the patient today.  Concerns regarding medicines are outlined above.  Medication changes, Labs and Tests ordered today are listed in the Patient Instructions below. There are no Patient Instructions on file for this visit.   Signed, Lesleigh Noe, MD  03/15/2016 9:28 AM    [facility] [address] [place], [place], [place]  [address] Phone: [phone]; Fax: [phone]

## 2016-03-15 ENCOUNTER — Ambulatory Visit (INDEPENDENT_AMBULATORY_CARE_PROVIDER_SITE_OTHER): Payer: Medicare Other | Admitting: Interventional Cardiology

## 2016-03-15 ENCOUNTER — Encounter (INDEPENDENT_AMBULATORY_CARE_PROVIDER_SITE_OTHER): Payer: Self-pay

## 2016-03-15 ENCOUNTER — Encounter: Payer: Self-pay | Admitting: Interventional Cardiology

## 2016-03-15 VITALS — BP 124/70 | HR 68 | Ht 61.0 in | Wt 167.2 lb

## 2016-03-15 DIAGNOSIS — I2510 Atherosclerotic heart disease of native coronary artery without angina pectoris: Secondary | ICD-10-CM

## 2016-03-15 DIAGNOSIS — R0789 Other chest pain: Secondary | ICD-10-CM

## 2016-03-15 DIAGNOSIS — E1159 Type 2 diabetes mellitus with other circulatory complications: Secondary | ICD-10-CM

## 2016-03-15 DIAGNOSIS — I119 Hypertensive heart disease without heart failure: Secondary | ICD-10-CM

## 2016-03-15 DIAGNOSIS — I10 Essential (primary) hypertension: Secondary | ICD-10-CM | POA: Diagnosis not present

## 2016-03-15 DIAGNOSIS — E785 Hyperlipidemia, unspecified: Secondary | ICD-10-CM | POA: Diagnosis not present

## 2016-03-15 NOTE — Patient Instructions (Signed)
Medication Instructions:  Your physician recommends that you continue on your current medications as directed. Please refer to the Current Medication list given to you today.   Labwork: none  Testing/Procedures: none  Follow-Up: Your physician wants you to follow-up in: 9-12 months.  You will receive a reminder letter in the mail two months in advance. If you don't receive a letter, please call our office to schedule the follow-up appointment.    Please contact our office if you have any increase in chest pain or other symptoms.   Any Other Special Instructions Will Be Listed Below (If Applicable).     If you need a refill on your cardiac medications before your next appointment, please call your pharmacy.

## 2016-04-01 DIAGNOSIS — E113293 Type 2 diabetes mellitus with mild nonproliferative diabetic retinopathy without macular edema, bilateral: Secondary | ICD-10-CM | POA: Diagnosis not present

## 2016-04-01 DIAGNOSIS — H3530 Unspecified macular degeneration: Secondary | ICD-10-CM | POA: Diagnosis not present

## 2016-04-01 DIAGNOSIS — H25812 Combined forms of age-related cataract, left eye: Secondary | ICD-10-CM | POA: Diagnosis not present

## 2016-04-12 DIAGNOSIS — R197 Diarrhea, unspecified: Secondary | ICD-10-CM | POA: Diagnosis not present

## 2016-04-12 DIAGNOSIS — Z1211 Encounter for screening for malignant neoplasm of colon: Secondary | ICD-10-CM | POA: Diagnosis not present

## 2016-04-12 DIAGNOSIS — K641 Second degree hemorrhoids: Secondary | ICD-10-CM | POA: Diagnosis not present

## 2016-04-14 DIAGNOSIS — I2510 Atherosclerotic heart disease of native coronary artery without angina pectoris: Secondary | ICD-10-CM | POA: Diagnosis not present

## 2016-04-14 DIAGNOSIS — I10 Essential (primary) hypertension: Secondary | ICD-10-CM | POA: Diagnosis not present

## 2016-04-14 DIAGNOSIS — E119 Type 2 diabetes mellitus without complications: Secondary | ICD-10-CM | POA: Diagnosis not present

## 2016-04-14 DIAGNOSIS — H8113 Benign paroxysmal vertigo, bilateral: Secondary | ICD-10-CM | POA: Diagnosis not present

## 2016-04-15 ENCOUNTER — Telehealth: Payer: Self-pay | Admitting: *Deleted

## 2016-04-15 NOTE — Telephone Encounter (Signed)
The patient has newly diagnosed coronary artery disease. She is currently stable on medical therapy. She is cleared for the upcoming procedure.

## 2016-04-15 NOTE — Telephone Encounter (Signed)
[facility] fax from [facility], [facility], [place]: [phone], fax: [phone]).  Patient is due for 10 year screening colonoscopy with Dr. Loreta AveMann and cardiac clearance is needed prior to the appointment being scheduled.  Patient was last seen by Dr. Katrinka BlazingSmith 03/15/16.  Routing to MD to review/advise.

## 2016-04-18 NOTE — Telephone Encounter (Signed)
Routing (via fax in EPIC) to [employer] [place] at [facility].

## 2016-05-09 DIAGNOSIS — Z1212 Encounter for screening for malignant neoplasm of rectum: Secondary | ICD-10-CM | POA: Diagnosis not present

## 2016-05-09 DIAGNOSIS — Z1211 Encounter for screening for malignant neoplasm of colon: Secondary | ICD-10-CM | POA: Diagnosis not present

## 2016-07-18 DIAGNOSIS — I2510 Atherosclerotic heart disease of native coronary artery without angina pectoris: Secondary | ICD-10-CM | POA: Diagnosis not present

## 2016-07-18 DIAGNOSIS — N3942 Incontinence without sensory awareness: Secondary | ICD-10-CM | POA: Diagnosis not present

## 2016-07-18 DIAGNOSIS — E119 Type 2 diabetes mellitus without complications: Secondary | ICD-10-CM | POA: Diagnosis not present

## 2016-07-18 DIAGNOSIS — I10 Essential (primary) hypertension: Secondary | ICD-10-CM | POA: Diagnosis not present

## 2016-07-26 DIAGNOSIS — H50112 Monocular exotropia, left eye: Secondary | ICD-10-CM | POA: Diagnosis not present

## 2016-07-26 DIAGNOSIS — E119 Type 2 diabetes mellitus without complications: Secondary | ICD-10-CM | POA: Diagnosis not present

## 2016-07-26 DIAGNOSIS — H35033 Hypertensive retinopathy, bilateral: Secondary | ICD-10-CM | POA: Diagnosis not present

## 2016-07-26 DIAGNOSIS — H25812 Combined forms of age-related cataract, left eye: Secondary | ICD-10-CM | POA: Diagnosis not present

## 2016-11-14 DIAGNOSIS — E119 Type 2 diabetes mellitus without complications: Secondary | ICD-10-CM | POA: Diagnosis not present

## 2016-11-14 DIAGNOSIS — I2510 Atherosclerotic heart disease of native coronary artery without angina pectoris: Secondary | ICD-10-CM | POA: Diagnosis not present

## 2016-11-14 DIAGNOSIS — I639 Cerebral infarction, unspecified: Secondary | ICD-10-CM | POA: Diagnosis not present

## 2016-11-14 DIAGNOSIS — I10 Essential (primary) hypertension: Secondary | ICD-10-CM | POA: Diagnosis not present

## 2016-11-14 DIAGNOSIS — E559 Vitamin D deficiency, unspecified: Secondary | ICD-10-CM | POA: Diagnosis not present

## 2017-04-08 DIAGNOSIS — E876 Hypokalemia: Secondary | ICD-10-CM

## 2017-04-08 HISTORY — DX: Hypokalemia: E87.6

## 2017-04-16 ENCOUNTER — Inpatient Hospital Stay (HOSPITAL_COMMUNITY): Payer: Medicare Other

## 2017-04-16 ENCOUNTER — Inpatient Hospital Stay (HOSPITAL_BASED_OUTPATIENT_CLINIC_OR_DEPARTMENT_OTHER)
Admission: EM | Admit: 2017-04-16 | Discharge: 2017-04-28 | DRG: 233 | Disposition: A | Discharge status: Skilled Nursing Facility | Payer: Medicare Other | Attending: Cardiothoracic Surgery | Admitting: Cardiothoracic Surgery

## 2017-04-16 ENCOUNTER — Telehealth: Payer: Self-pay | Admitting: Cardiology

## 2017-04-16 ENCOUNTER — Emergency Department (HOSPITAL_BASED_OUTPATIENT_CLINIC_OR_DEPARTMENT_OTHER): Payer: Medicare Other

## 2017-04-16 ENCOUNTER — Encounter (HOSPITAL_BASED_OUTPATIENT_CLINIC_OR_DEPARTMENT_OTHER): Payer: Self-pay | Admitting: Emergency Medicine

## 2017-04-16 DIAGNOSIS — E119 Type 2 diabetes mellitus without complications: Secondary | ICD-10-CM | POA: Diagnosis present

## 2017-04-16 DIAGNOSIS — Z794 Long term (current) use of insulin: Secondary | ICD-10-CM | POA: Diagnosis not present

## 2017-04-16 DIAGNOSIS — R5381 Other malaise: Secondary | ICD-10-CM | POA: Diagnosis not present

## 2017-04-16 DIAGNOSIS — I119 Hypertensive heart disease without heart failure: Secondary | ICD-10-CM | POA: Diagnosis present

## 2017-04-16 DIAGNOSIS — I25110 Atherosclerotic heart disease of native coronary artery with unstable angina pectoris: Secondary | ICD-10-CM | POA: Diagnosis present

## 2017-04-16 DIAGNOSIS — D62 Acute posthemorrhagic anemia: Secondary | ICD-10-CM | POA: Diagnosis not present

## 2017-04-16 DIAGNOSIS — Z951 Presence of aortocoronary bypass graft: Secondary | ICD-10-CM | POA: Diagnosis not present

## 2017-04-16 DIAGNOSIS — E8770 Fluid overload, unspecified: Secondary | ICD-10-CM | POA: Diagnosis not present

## 2017-04-16 DIAGNOSIS — R791 Abnormal coagulation profile: Secondary | ICD-10-CM | POA: Diagnosis not present

## 2017-04-16 DIAGNOSIS — Z8249 Family history of ischemic heart disease and other diseases of the circulatory system: Secondary | ICD-10-CM | POA: Diagnosis not present

## 2017-04-16 DIAGNOSIS — E876 Hypokalemia: Secondary | ICD-10-CM | POA: Diagnosis not present

## 2017-04-16 DIAGNOSIS — I214 Non-ST elevation (NSTEMI) myocardial infarction: Secondary | ICD-10-CM | POA: Diagnosis not present

## 2017-04-16 DIAGNOSIS — Z79899 Other long term (current) drug therapy: Secondary | ICD-10-CM

## 2017-04-16 DIAGNOSIS — Z7982 Long term (current) use of aspirin: Secondary | ICD-10-CM | POA: Diagnosis not present

## 2017-04-16 DIAGNOSIS — E785 Hyperlipidemia, unspecified: Secondary | ICD-10-CM | POA: Diagnosis present

## 2017-04-16 DIAGNOSIS — F05 Delirium due to known physiological condition: Secondary | ICD-10-CM | POA: Diagnosis not present

## 2017-04-16 DIAGNOSIS — Z9049 Acquired absence of other specified parts of digestive tract: Secondary | ICD-10-CM | POA: Diagnosis not present

## 2017-04-16 DIAGNOSIS — Z823 Family history of stroke: Secondary | ICD-10-CM

## 2017-04-16 DIAGNOSIS — I2510 Atherosclerotic heart disease of native coronary artery without angina pectoris: Secondary | ICD-10-CM

## 2017-04-16 DIAGNOSIS — J95821 Acute postprocedural respiratory failure: Secondary | ICD-10-CM | POA: Diagnosis not present

## 2017-04-16 DIAGNOSIS — Z01810 Encounter for preprocedural cardiovascular examination: Secondary | ICD-10-CM | POA: Diagnosis not present

## 2017-04-16 DIAGNOSIS — G4700 Insomnia, unspecified: Secondary | ICD-10-CM | POA: Diagnosis not present

## 2017-04-16 DIAGNOSIS — Z23 Encounter for immunization: Secondary | ICD-10-CM | POA: Diagnosis not present

## 2017-04-16 DIAGNOSIS — Z9114 Patient's other noncompliance with medication regimen: Secondary | ICD-10-CM

## 2017-04-16 DIAGNOSIS — I200 Unstable angina: Secondary | ICD-10-CM | POA: Diagnosis present

## 2017-04-16 DIAGNOSIS — I69351 Hemiplegia and hemiparesis following cerebral infarction affecting right dominant side: Secondary | ICD-10-CM | POA: Diagnosis not present

## 2017-04-16 DIAGNOSIS — J939 Pneumothorax, unspecified: Secondary | ICD-10-CM

## 2017-04-16 DIAGNOSIS — Z87891 Personal history of nicotine dependence: Secondary | ICD-10-CM

## 2017-04-16 HISTORY — DX: Unspecified macular degeneration: H35.30

## 2017-04-16 LAB — ECHOCARDIOGRAM COMPLETE
HEIGHTINCHES: 61 in
Weight: 2592 oz

## 2017-04-16 LAB — HEPARIN LEVEL (UNFRACTIONATED): Heparin Unfractionated: 0.18 IU/mL — ABNORMAL LOW (ref 0.30–0.70)

## 2017-04-16 LAB — BASIC METABOLIC PANEL
ANION GAP: 12 (ref 5–15)
BUN: 19 mg/dL (ref 6–20)
CO2: 25 mmol/L (ref 22–32)
Calcium: 10.2 mg/dL (ref 8.9–10.3)
Chloride: 100 mmol/L — ABNORMAL LOW (ref 101–111)
Creatinine, Ser: 1.12 mg/dL — ABNORMAL HIGH (ref 0.44–1.00)
GFR calc Af Amer: 56 mL/min — ABNORMAL LOW (ref >60)
GFR calc non Af Amer: 48 mL/min — ABNORMAL LOW (ref >60)
Glucose, Bld: 245 mg/dL — ABNORMAL HIGH (ref 65–99)
POTASSIUM: 3.4 mmol/L — AB (ref 3.5–5.1)
SODIUM: 137 mmol/L (ref 135–145)

## 2017-04-16 LAB — GLUCOSE, CAPILLARY
GLUCOSE-CAPILLARY: 164 mg/dL — AB (ref 65–99)
GLUCOSE-CAPILLARY: 248 mg/dL — AB (ref 65–99)

## 2017-04-16 LAB — TROPONIN I
TROPONIN I: 2.6 ng/mL — AB (ref <0.03)
Troponin I: 0.03 ng/mL (ref <0.03)
Troponin I: 2.5 ng/mL (ref <0.03)
Troponin I: 2.52 ng/mL (ref <0.03)

## 2017-04-16 LAB — CBC
HEMATOCRIT: 33.1 % — AB (ref 36.0–46.0)
Hemoglobin: 11.5 g/dL — ABNORMAL LOW (ref 12.0–15.0)
MCH: 28.3 pg (ref 26.0–34.0)
MCHC: 34.7 g/dL (ref 30.0–36.0)
MCV: 81.5 fL (ref 78.0–100.0)
Platelets: 237 10*3/uL (ref 150–400)
RBC: 4.06 MIL/uL (ref 3.87–5.11)
RDW: 13.8 % (ref 11.5–15.5)
WBC: 9.1 10*3/uL (ref 4.0–10.5)

## 2017-04-16 MED ORDER — ATORVASTATIN CALCIUM 80 MG PO TABS
80.0000 mg | ORAL_TABLET | Freq: Every day | ORAL | Status: DC
  Administered 2017-04-16 – 2017-04-17 (×2): 80 mg via ORAL
  Filled 2017-04-16 (×2): qty 1

## 2017-04-16 MED ORDER — HYDROCHLOROTHIAZIDE 25 MG PO TABS
25.0000 mg | ORAL_TABLET | Freq: Every day | ORAL | Status: DC
  Administered 2017-04-16: 25 mg via ORAL
  Filled 2017-04-16: qty 1

## 2017-04-16 MED ORDER — OLMESARTAN-AMLODIPINE-HCTZ 40-10-25 MG PO TABS: 1.0000 | ORAL_TABLET | Freq: Every day | ORAL | Status: DC

## 2017-04-16 MED ORDER — NITROGLYCERIN 0.4 MG SL SUBL: 0.4000 mg | SUBLINGUAL_TABLET | SUBLINGUAL | Status: DC | PRN

## 2017-04-16 MED ORDER — METOPROLOL TARTRATE 12.5 MG HALF TABLET
12.5000 mg | ORAL_TABLET | Freq: Two times a day (BID) | ORAL | Status: DC
  Administered 2017-04-16 – 2017-04-17 (×3): 12.5 mg via ORAL
  Filled 2017-04-16 (×3): qty 1

## 2017-04-16 MED ORDER — SODIUM CHLORIDE 0.9 % WEIGHT BASED INFUSION: 1.0000 mL/kg/h | INTRAVENOUS | Status: DC

## 2017-04-16 MED ORDER — ONDANSETRON HCL 4 MG/2ML IJ SOLN: 4.0000 mg | Freq: Four times a day (QID) | INTRAMUSCULAR | Status: DC | PRN

## 2017-04-16 MED ORDER — POTASSIUM CHLORIDE CRYS ER 20 MEQ PO TBCR
40.0000 meq | EXTENDED_RELEASE_TABLET | Freq: Once | ORAL | Status: AC
  Administered 2017-04-16: 40 meq via ORAL
  Filled 2017-04-16: qty 2

## 2017-04-16 MED ORDER — NITROGLYCERIN 0.4 MG SL SUBL
0.4000 mg | SUBLINGUAL_TABLET | SUBLINGUAL | Status: DC | PRN
  Administered 2017-04-16 (×2): 0.4 mg via SUBLINGUAL
  Filled 2017-04-16: qty 1

## 2017-04-16 MED ORDER — NITROGLYCERIN IN D5W 200-5 MCG/ML-% IV SOLN
0.0000 ug/min | Freq: Once | INTRAVENOUS | Status: DC
Start: 2017-04-16 — End: 2017-04-16
  Filled 2017-04-16: qty 250

## 2017-04-16 MED ORDER — IRBESARTAN 300 MG PO TABS
300.0000 mg | ORAL_TABLET | Freq: Every day | ORAL | Status: DC
  Administered 2017-04-16: 300 mg via ORAL
  Filled 2017-04-16: qty 2
  Filled 2017-04-16: qty 1

## 2017-04-16 MED ORDER — NITROGLYCERIN 2 % TD OINT
1.0000 [in_us] | TOPICAL_OINTMENT | Freq: Four times a day (QID) | TRANSDERMAL | Status: DC
  Administered 2017-04-16: 1 [in_us] via TOPICAL
  Filled 2017-04-16: qty 30
  Filled 2017-04-16: qty 1

## 2017-04-16 MED ORDER — ASPIRIN EC 81 MG PO TBEC: 81.0000 mg | DELAYED_RELEASE_TABLET | Freq: Every day | ORAL | Status: DC

## 2017-04-16 MED ORDER — INSULIN ASPART 100 UNIT/ML ~~LOC~~ SOLN
0.0000 [IU] | Freq: Three times a day (TID) | SUBCUTANEOUS | Status: DC
  Administered 2017-04-16: 3 [IU] via SUBCUTANEOUS
  Administered 2017-04-17 (×2): 5 [IU] via SUBCUTANEOUS

## 2017-04-16 MED ORDER — AMLODIPINE BESYLATE 10 MG PO TABS
10.0000 mg | ORAL_TABLET | Freq: Every day | ORAL | Status: DC
  Administered 2017-04-16: 10 mg via ORAL
  Filled 2017-04-16 (×2): qty 1

## 2017-04-16 MED ORDER — NITROGLYCERIN IN D5W 200-5 MCG/ML-% IV SOLN: 0.0000 ug/min | INTRAVENOUS | Status: DC

## 2017-04-16 MED ORDER — NITROGLYCERIN 0.4 MG SL SUBL
SUBLINGUAL_TABLET | SUBLINGUAL | Status: AC
  Filled 2017-04-16: qty 1

## 2017-04-16 MED ORDER — HEPARIN (PORCINE) IN NACL 100-0.45 UNIT/ML-% IJ SOLN
1000.0000 [IU]/h | INTRAMUSCULAR | Status: DC
  Administered 2017-04-16: 750 [IU]/h via INTRAVENOUS
  Filled 2017-04-16 (×2): qty 250

## 2017-04-16 MED ORDER — SODIUM CHLORIDE 0.9 % WEIGHT BASED INFUSION
3.0000 mL/kg/h | INTRAVENOUS | Status: DC
  Administered 2017-04-17: 3 mL/kg/h via INTRAVENOUS

## 2017-04-16 MED ORDER — ASPIRIN 81 MG PO CHEW
324.0000 mg | CHEWABLE_TABLET | Freq: Once | ORAL | Status: AC
  Administered 2017-04-16: 324 mg via ORAL
  Filled 2017-04-16: qty 4

## 2017-04-16 MED ORDER — SODIUM CHLORIDE 0.9% FLUSH
3.0000 mL | INTRAVENOUS | Status: DC | PRN
Start: 2017-04-16 — End: 2017-04-17

## 2017-04-16 MED ORDER — NITROGLYCERIN IN D5W 200-5 MCG/ML-% IV SOLN
INTRAVENOUS | Status: AC
  Administered 2017-04-16: 5 ug
  Filled 2017-04-16: qty 250

## 2017-04-16 MED ORDER — HEPARIN BOLUS VIA INFUSION
3500.0000 [IU] | Freq: Once | INTRAVENOUS | Status: AC
  Administered 2017-04-16: 3500 [IU] via INTRAVENOUS

## 2017-04-16 MED ORDER — INSULIN ASPART 100 UNIT/ML ~~LOC~~ SOLN
0.0000 [IU] | Freq: Once | SUBCUTANEOUS | Status: AC
  Administered 2017-04-16: 5 [IU] via SUBCUTANEOUS

## 2017-04-16 MED ORDER — ASPIRIN 81 MG PO CHEW
81.0000 mg | CHEWABLE_TABLET | ORAL | Status: AC
  Administered 2017-04-17: 81 mg via ORAL
  Filled 2017-04-16: qty 1

## 2017-04-16 MED ORDER — ACETAMINOPHEN 325 MG PO TABS
650.0000 mg | ORAL_TABLET | ORAL | Status: DC | PRN
  Administered 2017-04-17: 650 mg via ORAL
  Filled 2017-04-16: qty 2

## 2017-04-16 MED ORDER — SODIUM CHLORIDE 0.9% FLUSH: 3.0000 mL | Freq: Two times a day (BID) | INTRAVENOUS | Status: DC

## 2017-04-16 MED ORDER — SODIUM CHLORIDE 0.9 % IV SOLN: 250.0000 mL | INTRAVENOUS | Status: DC | PRN

## 2017-04-16 NOTE — Progress Notes (Signed)
CARDIOLOGY  Andrea [name] is well known to me and is a retired Statisticianeducation professor at [employer] with long-standing type 2 diabetes, prior ischemic stroke, hypertension, hyperlipidemia, and positive family history of CAD.  She has progressive angina over the past 2 months, significantly worse over the past 2 weeks especially as she has needed to walk longer distances at [employer] football games. Last evening, she was awakened by pain at rest and came to the ED and admitted with NSTEACS.  Exam is unremarkable  EKG during pain is significant for diffuse ischemia with ST elevation in aVR and generalized depression elsewhere, raising concern for left main disease or left main equivalent.  Plan should be early invasive strategy with coronary angiography planned for a.m. unless recurring symptoms other not controllable on medical therapy. Medical therapy should include beta blocker, IV nitroglycerin, IV heparin, and IV IIb IIIa ( if needed). Would withhold P2 Y 12 inhibitor therapy, unless PCI with stenting, so as not to prolong hospital stay if cardiac surgery is needed.  Discussed approach with the patient and her husband/family including the benefits and risks of cath and PCI.The patient was counseled to undergo left heart catheterization, coronary angiography, and possible percutaneous coronary intervention with stent implantation. The procedural risks and benefits were discussed in detail. The risks discussed included death, stroke, myocardial infarction, life-threatening bleeding, limb ischemia, kidney injury, allergy, and possible emergency cardiac surgery. The risk of these significant complications were estimated to occur less than 1% of the time. After discussion, the patient has agreed to proceed.

## 2017-04-16 NOTE — H&P (Signed)
Cardiology Admission History and Physical:   Patient ID: Andrea [name]; MRN: [id number]; DOB: 08-23-1945   Admission date: 04/16/2017  Primary Care Provider: Laurena Slimmerlark, Preston S, MD Primary Cardiologist: Dr. Katrinka BlazingSmith    Chief Complaint:  Chest Pain/ Unstable Angina  Patient Profile:   Andrea [name] is a 71 y.o. female with a history of h/o T2DM, HTN, HLD and prior stroke, presenting from [facility] with chest pain c/w unstable angina.   History of Present Illness:   As outlined above, Andrea [name] has multiple cardiac risk factors including DM, HTN and HLD as well as a prior h/o CVA. She has been followed by Dr. Katrinka BlazingSmith. 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  In 2017, she was evaluated for chest pain that was concerning for cardiac etiology. She was having chest discomfort compatible with stable angina with activity such as grocery shopping and unloading groceries from her car. However, she had a nuclear stress test that was low risk/normal. She was treated with medical therapy and placed on a BB, metoprolol 50 mg, and LA nitrate, Imdur 30 mg. This resulted in symptomatic improvement. She was seen back by Dr. Katrinka BlazingSmith in f/u, who noted "Given her history of vascular disease, prior stroke, diabetes, and other risk factors, it would be a mistake to assume that her anginal symptoms were noncardiac". This was back in May 2017. Unfortunately, since that time she has had issues with dizziness/ intolerance to her antianginals and quit taking them ~June 2017. Since that time, she has been using PRN SL NTG for recurrences.  Over the last 2 months, she has had more frequent recurrences of anginal symptoms, mainly occurring with activity, however, more recently, symptoms have become more unstable, occurring at rest.   Yesterday afternoon, she had resting substernal chest tightness, radiating up her neck, bilateral jaw and down her left arm, that improved after taking SL NTG. Later  on in the day, while attending the football game at [employer], she developed exertional chest tightness walking up the stadium bleachers. She had to stop and rest. It eased off. She later went home and went to bed and developed recurrent chest discomfort awakening her from her sleep. Her [facility] then [facility].   EKG there showed sinus rhythm and LVH with secondary repolarization abnormality or inferior-lateral ischemia (new since 2017). Initial troponin is minimally abnormal at 0.03. She was placed on IV heparin and nitro paste and [facility] management/ LHC.   [facility], she is stable and chest pain free. VSS. Family present by bedside. Her daughter is a NP in the [employer] [facility] [employer] [facility].   Additional labs: Hgb 11.5. K 3.4. Scr 1.12. Glucose 245.    Past Medical History:  Diagnosis Date  . Diabetes mellitus   . Hypertension   . Stroke [facility](HCC)     Past Surgical History:  Procedure Laterality Date  . CHOLECYSTECTOMY    . ROTATOR CUFF REPAIR     left     Medications Prior to Admission: Prior to Admission medications   Medication Sig Start Date End Date Taking? Authorizing Provider  aspirin 81 MG tablet Take 81 mg by mouth daily.    [provider]  Cholecalciferol (VITAMIN D) 2000 UNITS tablet Take 2,000 Units by mouth daily.    [provider]  insulin detemir (LEVEMIR) 100 UNIT/ML injection Inject 100 Units into the skin at bedtime.  [provider]  naproxen sodium (ANAPROX) 220 MG tablet Take 220 mg by mouth 2 (two) times daily as needed. For pain.    [provider]  nitroGLYCERIN (NITROSTAT) 0.4 MG SL tablet Place 1 tablet (0.4 mg total) under the tongue every 5 (five) minutes as needed for chest pain. Take if chest pressure is present after 3-5 min of rest 12/18/15   Lyn Records, MD  Olmesartan-Amlodipine-HCTZ 40-10-25 MG TABS Take 1 tablet by mouth daily. 12/23/15   [provider]    [facility] VERIO test strip Use as directed four times daily to test blood sugar level. 01/04/16   [provider]  sitaGLIPtan-metformin (JANUMET) 50-1000 MG per tablet Take 1 tablet by mouth 2 (two) times daily with a meal.    [provider]  VESICARE 10 MG tablet Take 10 mg by mouth daily. 12/23/15   [provider]  vitamin E 400 UNIT capsule Take 400 Units by mouth daily.    [provider]     Allergies:   No Known Allergies  Social History:   Social History   Social History  . Marital status: Married    Spouse name: N/A  . Number of children: N/A  . Years of education: N/A   Occupational History  . Not on file.   Social History Main Topics  . Smoking status: Former Smoker    Packs/day: 0.50    Years: 20.00    Types: Cigarettes    Quit date: 01/02/1997  . Smokeless tobacco: Never Used  . Alcohol use No  . Drug use: No  . Sexual activity: Not on file   Other Topics Concern  . Not on file   Social History Narrative  . No narrative on file    Family History:   The patient's family history includes Healthy in her brother; Heart attack in her brother and father; Hypertension in her brother, father, and mother; Stroke in her mother.    ROS:  Please see the history of present illness.  All other ROS reviewed and negative.     Physical Exam/Data:   Vitals:   04/16/17 0600 04/16/17 0615 04/16/17 0629 04/16/17 0731  BP: 130/75 125/75  124/68  Pulse: 74 76  72  Resp:      Temp:   97.8 F (36.6 C) 98 F (36.7 C)  TempSrc:   Oral Oral  SpO2: 99% 100%  93%  Weight:      Height:       No intake or output data in the 24 hours ending 04/16/17 0924 Filed Weights   04/16/17 0304  Weight: 162 lb (73.5 kg)   Body mass index is 30.61 kg/m.  General:  Well nourished, well developed, in no acute distress HEENT: normal Lymph: no adenopathy Neck: no JVD Endocrine:  No thryomegaly Vascular: No carotid bruits; FA pulses 2+ bilaterally  without bruits  Cardiac:  normal S1, S2; RRR; no murmur  Lungs:  clear to auscultation bilaterally, no wheezing, rhonchi or rales  Abd: soft, nontender, no hepatomegaly  Ext: no edema Musculoskeletal:  No deformities, BUE and BLE strength normal and equal Skin: warm and dry  Neuro:  CNs 2-12 intact, no focal abnormalities noted Psych:  Normal affect    EKG:  The ECG that was done 04/16/17 was personally reviewed and demonstrates  Sinus rhythm, LVH with secondary repolarization abnormality or inferior-lateral ischemia (new since 2017)  Relevant CV Studies: 2D Echo - pending  LHC - pending  Laboratory Data:  Chemistry  Recent Labs Lab 04/16/17 0306  NA 137  K 3.4*  CL 100*  CO2 25  GLUCOSE 245*  BUN 19  CREATININE 1.12*  CALCIUM 10.2  GFRNONAA 48*  GFRAA 56*  ANIONGAP 12    No results for input(s): PROT, ALBUMIN, AST, ALT, ALKPHOS, BILITOT in the last 168 hours. Hematology  Recent Labs Lab 04/16/17 0306  WBC 9.1  RBC 4.06  HGB 11.5*  HCT 33.1*  MCV 81.5  MCH 28.3  MCHC 34.7  RDW 13.8  PLT 237   Cardiac Enzymes  Recent Labs Lab 04/16/17 0306  TROPONINI 0.03*   No results for input(s): TROPIPOC in the last 168 hours.  BNPNo results for input(s): BNP, PROBNP in the last 168 hours.  DDimer No results for input(s): DDIMER in the last 168 hours.  Radiology/Studies:  Dg Chest 2 View  Result Date: 04/16/2017 CLINICAL DATA:  Chest pain beginning yesterday afternoon while walking, relieved with nitroglycerin. Similar chest pain this morning. History of hypertension, diabetes. EXAM: CHEST  2 VIEW COMPARISON:  Chest radiograph Jan 03, 2012 FINDINGS: Cardiac silhouette is normal in size. Mediastinal silhouette is nonsuspicious. Similarly tortuous aorta seen with chronic hypertension. No pleural effusion or focal consolidation. No pneumothorax. Mild degenerative change of thoracic spine. Surgical clips in the included right abdomen compatible with cholecystectomy.  IMPRESSION: Stable examination:  No acute cardiopulmonary process. Electronically Signed   By: Awilda Metro M.D.   On: 04/16/2017 03:36    Assessment and Plan:   1. Unstable Angina: Pt with classic anginal symptoms, which have progressed from stable to unstable. EKg with inferolateal ST depressions (new).  She is currently CP free on IV heparin and nitro paste. Initial troponin is minimally abnormal at 0.03. We will continue to cycle x 3 to assess trend. In addition to IV heparin, we will treat with ASA 81. Will add BB therapy, Lopressor 12.5 mg BID and add high dose statin, Lipitor 80 mg. NPO at midnight for [facility] +/- PCI. 2D echo today to assess LVF and wall motion.   I have discussed indication for LHC as well as reviewed procedural details and outlined potential risk, which include but not limited to death, MI, CVA, need for emergency CABG, vascular injury, nephrotoxicity, major bleeding and allergic reaction to contrast dye. The patient fully understands these risk and agrees to proceed with cardiac cath.    2. HTN: controlled on current regimen. Given ACS, will add BB, metoprolol 12.5 mg BID. Can further titrate if needed.   3. T2DM: holding Metformin in anticipation for cath. Will use SSI for coverage.   4. DLD: h/o dyslipidemia  However not currently on a statin. Last lipid panel on file from 2013 showed elevated TGs at 257. LDL was 60. Given ACS, we will add high dose Lipitor, 80 mg, and will check FLP for assessment of baseline.   5. Hypokalemia: K 3.4. Will give supplemental Kdur, 40 mEq. F/u BMP in the am.   6. Anemia: Hgb 11.5. Last CBC on file was from 2013. Hgb was 12.7 at that time. We will monitor trend closely.   Severity of Illness: The appropriate patient status for this patient is INPATIENT. Inpatient status is judged to be reasonable and necessary in order to provide the required intensity of service to ensure the patient's safety. The patient's presenting symptoms,  physical exam findings, and initial radiographic and laboratory data in the context of their chronic comorbidities is felt to place them at high  risk for further clinical deterioration. Furthermore, it is not anticipated that the patient will be medically stable for discharge from the hospital within 2 midnights of admission. The following factors support the patient status of inpatient.   " The patient's presenting symptoms include Unstable Angina/ACS. " The worrisome physical exam findings include abnormal EKG. " The initial radiographic and laboratory data are worrisome because of abnormal EKG and troponin. " The chronic co-morbidities include T2DM, HTN, HLD and h/o CVA.    I certify that at the point of admission it is my clinical judgment that the patient will require inpatient hospital care spanning beyond 2 midnights from the point of admission due to high intensity of service, high risk for further deterioration and high frequency of surveillance required.    For questions or updates, please contact [facility] Please consult [email or web address] for contact info under Cardiology/STEMI. Daytime calls, contact the Day Call APP (6a-8a) or assigned team (Teams A-D) provider (7:30a - 5p). All other daytime calls (7:30-5p), contact the Card Master @ [phone].   Nighttime calls, contact the assigned APP (5p-8p) or MD (6:30p-8p). Overnight calls (8p-6a), contact the on call Fellow @ [phone].  MD to assess pt and will follow with further recommendations.     Signed, Robbie Lis, PA-C  04/16/2017 9:24 AM    I have seen, examined the patient, and reviewed the above assessment and plan.  On exam, RRR.  Changes to above are made where necessary.  Pt presents with crescendoing angina and ST depressions worrisome for [place].  Will admit with plans for cath tomorrow. I would therefore recommend left heart catheterization with possible PCI.  Discussed the cath with the patient. The patient  understands that risks included but are not limited to stroke (1 in 1000), death (1 in 1000), kidney failure [usually temporary] (1 in 500), bleeding (1 in 200), allergic reaction [possibly serious] (1 in 200). The patient understands and agrees to proceed.    Co Sign: Hillis Range, MD 04/16/2017 9:42 AM

## 2017-04-16 NOTE — Progress Notes (Signed)
Pt called out with complaints of chest pain. 1 out of 10 midsternal but stated she "didnt feel good". At the same time I got called for her troponin of 2.5. EKG done with some changes, Nada BoozerLaura Ingold on floor and notified. Orders for IV Nitro gtt received. I d/c'd nitro paste off of right chest and started IV nitro gtt. Nada BoozerLaura Ingold came to see patient and she stated Dr. Johney FrameAllred was looking at the EKG. After I started the nitro gtt, her chest pain was relieved. Pt and family notified of plan and reinforced need to call for any more chest pain. Vital signs stable. Will continue to monitor

## 2017-04-16 NOTE — ED Provider Notes (Signed)
[facility] Provider Note   CSN: [id number] Arrival date & time: 04/16/17  0250     History   Chief Complaint Chief Complaint  Patient presents with  . Chest Pain    HPI Andrea [name] is a 71 y.o. female.  Patient presents to the emergency department for evaluation of chest pain. Patient reports that she had an episode of chest pain around 4:30 PM today while getting ready to go out. She reports that she took a sublingual nitroglycerin and the pain resolved. She then woke up tonight with pain once again. Pain is in the left side of her chest, radiates to the left arm and bilateral jaw. She feels slightly short of breath. She did not take any more nitroglycerin because she didn't know if she could take anymore. No nausea or diaphoresis associated with the symptoms. Patient reports that she has had similar pains in the past, evaluated by Dr. Katrinka Blazing of cardiology. He has labeled angina and has her take nitroglycerin for this.      Past Medical History:  Diagnosis Date  . Diabetes mellitus   . Hypertension   . Stroke [facility])     Patient Active Problem List   Diagnosis Date Noted  . Unstable angina (HCC) 04/16/2017  . Hyperlipidemia 03/14/2016  . Hypertensive heart disease 12/17/2015  . Diabetes mellitus (HCC) 01/04/2012  . Hypertension   . Stroke [facility])     Past Surgical History:  Procedure Laterality Date  . CHOLECYSTECTOMY    . ROTATOR CUFF REPAIR     left    OB History    No data available       Home Medications    Prior to Admission medications   Medication Sig Start Date End Date Taking? Authorizing Provider  aspirin 81 MG tablet Take 81 mg by mouth daily.    [provider]  Cholecalciferol (VITAMIN D) 2000 UNITS tablet Take 2,000 Units by mouth daily.    [provider]  insulin detemir (LEVEMIR) 100 UNIT/ML injection Inject 100 Units into the skin at bedtime.    [provider]  naproxen sodium (ANAPROX) 220 MG  tablet Take 220 mg by mouth 2 (two) times daily as needed. For pain.    [provider]  nitroGLYCERIN (NITROSTAT) 0.4 MG SL tablet Place 1 tablet (0.4 mg total) under the tongue every 5 (five) minutes as needed for chest pain. Take if chest pressure is present after 3-5 min of rest 12/18/15   Lyn Records, MD  Olmesartan-Amlodipine-HCTZ 40-10-25 MG TABS Take 1 tablet by mouth daily. 12/23/15   [provider]  [facility] VERIO test strip Use as directed four times daily to test blood sugar level. 01/04/16   [provider]  sitaGLIPtan-metformin (JANUMET) 50-1000 MG per tablet Take 1 tablet by mouth 2 (two) times daily with a meal.    [provider]  VESICARE 10 MG tablet Take 10 mg by mouth daily. 12/23/15   [provider]  vitamin E 400 UNIT capsule Take 400 Units by mouth daily.    [provider]    Family History Family History  Problem Relation Age of Onset  . Heart attack Father   . Hypertension Father   . Heart attack Brother   . Hypertension Brother   . Hypertension Mother   . Stroke Mother   . Healthy Brother     Social History Social History  Substance Use Topics  . Smoking status: Former Smoker  Packs/day: 0.50    Years: 20.00    Types: Cigarettes    Quit date: 01/02/1997  . Smokeless tobacco: Never Used  . Alcohol use No     Allergies   Patient has no known allergies.   Review of Systems Review of Systems  Cardiovascular: Positive for chest pain.  All other systems reviewed and are negative.    Physical Exam Updated Vital Signs BP 122/70   Pulse 85   Temp 98.3 F (36.8 C) (Oral)   Resp 15   Ht 5\' 1"  (1.549 m)   Wt 73.5 kg (162 lb)   SpO2 95%   BMI 30.61 kg/m   Physical Exam  Constitutional: She is oriented to person, place, and time. She appears well-developed and well-nourished. No distress.  HENT:  Head: Normocephalic and atraumatic.  Right Ear: Hearing normal.  Left Ear: Hearing  normal.  Nose: Nose normal.  Mouth/Throat: Oropharynx is clear and moist and mucous membranes are normal.  Eyes: Pupils are equal, round, and reactive to light. Conjunctivae and EOM are normal.  Neck: Normal range of motion. Neck supple.  Cardiovascular: Regular rhythm, S1 normal and S2 normal.  Exam reveals no gallop and no friction rub.   No murmur heard. Pulmonary/Chest: Effort normal and breath sounds normal. No respiratory distress. She exhibits no tenderness.  Abdominal: Soft. Normal appearance and bowel sounds are normal. There is no hepatosplenomegaly. There is no tenderness. There is no rebound, no guarding, no tenderness at McBurney's point and negative Murphy's sign. No hernia.  Musculoskeletal: Normal range of motion.  Neurological: She is alert and oriented to person, place, and time. She has normal strength. No cranial nerve deficit or sensory deficit. Coordination normal. GCS eye subscore is 4. GCS verbal subscore is 5. GCS motor subscore is 6.  Skin: Skin is warm, dry and intact. No rash noted. No cyanosis.  Psychiatric: She has a normal mood and affect. Her speech is normal and behavior is normal. Thought content normal.  Nursing note and vitals reviewed.    ED Treatments / Results  Labs (all labs ordered are listed, but only abnormal results are displayed) Labs Reviewed  BASIC METABOLIC PANEL - Abnormal; Notable for the following:       Result Value   Potassium 3.4 (*)    Chloride 100 (*)    Glucose, Bld 245 (*)    Creatinine, Ser 1.12 (*)    GFR calc non Af Amer 48 (*)    GFR calc Af Amer 56 (*)    All other components within normal limits  CBC - Abnormal; Notable for the following:    Hemoglobin 11.5 (*)    HCT 33.1 (*)    All other components within normal limits  TROPONIN I - Abnormal; Notable for the following:    Troponin I 0.03 (*)    All other components within normal limits    EKG 1 (with Pain)  EKG Interpretation  Date/Time:  Sunday April 16 2017 03:08:18 EDT Ventricular Rate:  93 PR Interval:    QRS Duration: 108 QT Interval:  368 QTC Calculation: 458 R Axis:   -17 Text Interpretation:  Sinus rhythm LVH with secondary repolarization abnormality or inferior-lateral ischemia (new since 2017) Reconfirmed by Gilda CreasePollina, Hilberto Burzynski J [phone]([id number]) on 04/16/2017 3:23:27 AM      EKG 2 (Pain Free)  EKG Interpretation  Date/Time:  Sunday April 16 2017 03:57:55 EDT Ventricular Rate:  85 PR Interval:    QRS Duration: 94 QT Interval:  382 QTC Calculation: 455 R Axis:   -26 Text Interpretation:  Sinus rhythm LVH with secondary repolarization abnormality (ST depression seen previously has improved) Confirmed by Gilda Crease [phone]) on 04/16/2017 4:11:28 AM       Radiology Dg Chest 2 View  Result Date: 04/16/2017 CLINICAL DATA:  Chest pain beginning yesterday afternoon while walking, relieved with nitroglycerin. Similar chest pain this morning. History of hypertension, diabetes. EXAM: CHEST  2 VIEW COMPARISON:  Chest radiograph Jan 03, 2012 FINDINGS: Cardiac silhouette is normal in size. Mediastinal silhouette is nonsuspicious. Similarly tortuous aorta seen with chronic hypertension. No pleural effusion or focal consolidation. No pneumothorax. Mild degenerative change of thoracic spine. Surgical clips in the included right abdomen compatible with cholecystectomy. IMPRESSION: Stable examination:  No acute cardiopulmonary process. Electronically Signed   By: Awilda Metro M.D.   On: 04/16/2017 03:36    Procedures Procedures (including critical care time)  Medications Ordered in ED Medications  nitroGLYCERIN (NITROSTAT) SL tablet 0.4 mg (0.4 mg Sublingual Given 04/16/17 0343)  nitroGLYCERIN (NITROGLYN) 2 % ointment 1 inch (not administered)  aspirin chewable tablet 324 mg (324 mg Oral Given 04/16/17 0336)     Initial Impression / Assessment and Plan / ED Course  I have reviewed the triage vital signs and the nursing  notes.  Pertinent labs & imaging results that were available during my care of the patient were reviewed by me and considered in my medical decision making (see chart for details).     Patient presents to the emergency department for evaluation of chest pain. This is her second episode of pain today. Patient had onset of pain earlier today while getting dressed. She took a sublingual nitroglycerin and the pain resolved. Tonight, however, pain came back and has been more severe.  Reviewing patient's records, she has been treated conservatively for presumed angina by Dr. Katrinka Blazing. She had a myocardial perfusion study in May 2017 that did not show any evidence of ischemia. She has, however, had several episodes of what sounded like classic angina in the past, but since she only had a couple of episodes, she was not scheduled for further study.  EKG at arrival tonight his change from baseline. She has inferior and lateral depressions. This might be progression of LVH, however, after she became pain-free, ST segments were closer to baseline. This is concerning for ischemia. Troponin, however, is negative.  She will require hospitalization at [facility] for further management. Discussed with Dr. Gae Gallop, On-call for cardiology. Excess patient for admission to telemetry. Will initiate heparin IV, does not require IV nitroglycerin at this time because she is pain-free. Will place Nitropaste.  CRITICAL CARE Performed by: Gilda Crease   Total critical care time: 30 minutes  Critical care time was exclusive of separately billable procedures and treating other patients.  Critical care was necessary to treat or prevent imminent or life-threatening deterioration.  Critical care was time spent personally by me on the following activities: development of treatment plan with patient and/or surrogate as well as nursing, discussions with consultants, evaluation of patient's response to treatment,  examination of patient, obtaining history from patient or surrogate, ordering and performing treatments and interventions, ordering and review of laboratory studies, ordering and review of radiographic studies, pulse oximetry and re-evaluation of patient's condition.   Final Clinical Impressions(s) / ED Diagnoses   Final diagnoses:  Unstable angina (HCC)    New Prescriptions New Prescriptions   No medications on file     Jaci Carrel  J, MD 04/16/17 [phone]

## 2017-04-16 NOTE — Progress Notes (Signed)
ANTICOAGULATION CONSULT NOTE  Pharmacy Consult for heparin  Indication: chest pain/ACS  No Known Allergies  Patient Measurements: Height: 5\' 1"  (154.9 cm) Weight: 162 lb (73.5 kg) IBW/kg (Calculated) : 47.8 Heparin Dosing Weight: 63.9 kg   Vital Signs: Temp: 97.8 F (36.6 C) (09/09 1505) Temp Source: Oral (09/09 1505) BP: 131/74 (09/09 1505) Pulse Rate: 64 (09/09 1505)  Labs:  Recent Labs  04/16/17 0306 04/16/17 0957 04/16/17 1429  HGB 11.5*  --   --   HCT 33.1*  --   --   PLT 237  --   --   HEPARINUNFRC  --   --  0.18*  CREATININE 1.12*  --   --   TROPONINI 0.03* 2.50*  --     Estimated Creatinine Clearance: 42.3 mL/min (A) (by C-G formula based on SCr of 1.12 mg/dL (H)).   Assessment: 71 yo female admitted with chest pain. Pharmacy consulted to dose heparin. No anticoagulation PTA per RN. Initial heparin level is subtherapeutic at 0.18. No bleeding or other issues noted.   Goal of Therapy:  Heparin level 0.3-0.7 units/ml Monitor platelets by anticoagulation protocol: Yes   Plan:  Increase heparin gtt to 900 units/hr Check an 8 hr heparin level Daily heparin level and CBC  Lysle Pearlachel Ryatt Corsino, PharmD, BCPS 04/16/2017 3:45 PM

## 2017-04-16 NOTE — Progress Notes (Signed)
  Echocardiogram 2D Echocardiogram has been performed.  Dewaun Kinzler T Brandyce Dimario 04/16/2017, 2:30 PM

## 2017-04-16 NOTE — ED Notes (Signed)
Dr. Blinda LeatherwoodPollina aware of troponin of 0.03

## 2017-04-16 NOTE — Progress Notes (Signed)
Second troponin results at 2.6. I texted Nada BoozerLaura Ingold to be made aware. Pt denies chest pain

## 2017-04-16 NOTE — Progress Notes (Signed)
ANTICOAGULATION CONSULT NOTE - Initial Consult  Pharmacy Consult for heparin  Indication: chest pain/ACS  No Known Allergies  Patient Measurements: Height: 5\' 1"  (154.9 cm) Weight: 162 lb (73.5 kg) IBW/kg (Calculated) : 47.8 Heparin Dosing Weight: 63.9 kg   Vital Signs: Temp: 98.3 F (36.8 C) (09/09 0303) Temp Source: Oral (09/09 0303) BP: 122/70 (09/09 0405) Pulse Rate: 85 (09/09 0405)  Labs:  Recent Labs  04/16/17 0306  HGB 11.5*  HCT 33.1*  PLT 237  CREATININE 1.12*  TROPONINI 0.03*    Estimated Creatinine Clearance: 42.3 mL/min (A) (by C-G formula based on SCr of 1.12 mg/dL (H)).   Medical History: Past Medical History:  Diagnosis Date  . Diabetes mellitus   . Hypertension   . Stroke [facility](HCC)    Assessment: 71 yo female admitted with chest pain. Pharmacy consulted to dose heparin. No anticoagulation PTA per RN.   Hgb slightly low at 11.5 and platelets normal. Troponin positive at 0.03. No s/s bleeding noted.   Goal of Therapy:  Heparin level 0.3-0.7 units/ml Monitor platelets by anticoagulation protocol: Yes   Plan:  Heparin bolus 3500 units IV x1 Start heparin gtt at 750 units/hr  Heparin level in 8 hrs Daily heparin level and CBC Monitor for s/s bleeding   York CeriseKatherine Cook, PharmD Clinical Pharmacist 04/16/17 4:31 AM

## 2017-04-16 NOTE — ED Triage Notes (Signed)
Pt states CP that started yesterday at 1630 that began while walking and was relieved with NTG. Pt states she was awakened with CP again this am. Pt did not take NTG at home PTA due to having had it at 1630. States pain is in central chest, L arm and jaw. Denies SOB. EDP at bedside.

## 2017-04-16 NOTE — Progress Notes (Signed)
Pt developed some mild chest discomfort and EKG done , Dr. Johney FrameAllred reviewed.  Troponin is now elevated at 2.5 IV NTG added.  Discussed heart attack with Pt and may need cath earlier if symptoms do not improve  With IV NTG symptoms resolved.  Pt aware of MI, Dr. Johney FrameAllred has seen.

## 2017-04-17 ENCOUNTER — Other Ambulatory Visit: Payer: Self-pay | Admitting: *Deleted

## 2017-04-17 ENCOUNTER — Inpatient Hospital Stay (HOSPITAL_COMMUNITY): Payer: Medicare Other

## 2017-04-17 ENCOUNTER — Encounter (HOSPITAL_COMMUNITY): Payer: Self-pay | Admitting: Cardiology

## 2017-04-17 ENCOUNTER — Encounter (HOSPITAL_COMMUNITY)
Admission: EM | Disposition: A | Discharge status: Skilled Nursing Facility | Payer: Self-pay | Source: Home / Self Care | Attending: Cardiothoracic Surgery

## 2017-04-17 DIAGNOSIS — I25110 Atherosclerotic heart disease of native coronary artery with unstable angina pectoris: Secondary | ICD-10-CM

## 2017-04-17 DIAGNOSIS — I214 Non-ST elevation (NSTEMI) myocardial infarction: Secondary | ICD-10-CM

## 2017-04-17 DIAGNOSIS — I2510 Atherosclerotic heart disease of native coronary artery without angina pectoris: Secondary | ICD-10-CM

## 2017-04-17 DIAGNOSIS — E1159 Type 2 diabetes mellitus with other circulatory complications: Secondary | ICD-10-CM

## 2017-04-17 DIAGNOSIS — Z794 Long term (current) use of insulin: Secondary | ICD-10-CM

## 2017-04-17 DIAGNOSIS — I10 Essential (primary) hypertension: Secondary | ICD-10-CM

## 2017-04-17 DIAGNOSIS — Z01810 Encounter for preprocedural cardiovascular examination: Secondary | ICD-10-CM

## 2017-04-17 HISTORY — PX: LEFT HEART CATH AND CORONARY ANGIOGRAPHY: CATH118249

## 2017-04-17 LAB — LIPID PANEL
Cholesterol: 117 mg/dL (ref 0–200)
HDL: 35 mg/dL — AB (ref >40)
LDL CALC: 41 mg/dL (ref 0–99)
Total CHOL/HDL Ratio: 3.3 RATIO
Triglycerides: 207 mg/dL — ABNORMAL HIGH (ref <150)
VLDL: 41 mg/dL — ABNORMAL HIGH (ref 0–40)

## 2017-04-17 LAB — BASIC METABOLIC PANEL
Anion gap: 9 (ref 5–15)
BUN: 19 mg/dL (ref 6–20)
CHLORIDE: 101 mmol/L (ref 101–111)
CO2: 25 mmol/L (ref 22–32)
CREATININE: 1.18 mg/dL — AB (ref 0.44–1.00)
Calcium: 9.1 mg/dL (ref 8.9–10.3)
GFR, EST AFRICAN AMERICAN: 52 mL/min — AB (ref >60)
GFR, EST NON AFRICAN AMERICAN: 45 mL/min — AB (ref >60)
Glucose, Bld: 177 mg/dL — ABNORMAL HIGH (ref 65–99)
POTASSIUM: 3.7 mmol/L (ref 3.5–5.1)
SODIUM: 135 mmol/L (ref 135–145)

## 2017-04-17 LAB — PROTIME-INR
INR: 1.11
INR: 1.1
PROTHROMBIN TIME: 14.2 s (ref 11.4–15.2)
Prothrombin Time: 14.1 seconds (ref 11.4–15.2)

## 2017-04-17 LAB — VAS US DOPPLER PRE CABG
LEFT ECA DIAS: -12 cm/s
LEFT VERTEBRAL DIAS: -9 cm/s
Left CCA dist dias: 15 cm/s
Left CCA dist sys: 64 cm/s
Left CCA prox dias: 24 cm/s
Left CCA prox sys: 164 cm/s
Left ICA dist dias: -27 cm/s
Left ICA dist sys: -85 cm/s
Left ICA prox dias: 18 cm/s
Left ICA prox sys: 72 cm/s
RIGHT ECA DIAS: -15 cm/s
RIGHT VERTEBRAL DIAS: -17 cm/s
Right CCA prox dias: 13 cm/s
Right CCA prox sys: 84 cm/s
Right cca dist sys: -81 cm/s

## 2017-04-17 LAB — HEPATIC FUNCTION PANEL
ALT: 21 U/L (ref 14–54)
AST: 25 U/L (ref 15–41)
Albumin: 3.1 g/dL — ABNORMAL LOW (ref 3.5–5.0)
Alkaline Phosphatase: 54 U/L (ref 38–126)
Bilirubin, Direct: 0.2 mg/dL (ref 0.1–0.5)
Indirect Bilirubin: 0.6 mg/dL (ref 0.3–0.9)
Total Bilirubin: 0.8 mg/dL (ref 0.3–1.2)
Total Protein: 5.9 g/dL — ABNORMAL LOW (ref 6.5–8.1)

## 2017-04-17 LAB — BLOOD GAS, ARTERIAL
Acid-Base Excess: 0.4 mmol/L (ref 0.0–2.0)
Bicarbonate: 24.3 mmol/L (ref 20.0–28.0)
Drawn by: 398991
O2 Content: 2 L/min
O2 Saturation: 95.1 %
Patient temperature: 98.6
pCO2 arterial: 37.1 mmHg (ref 32.0–48.0)
pH, Arterial: 7.431 (ref 7.350–7.450)
pO2, Arterial: 75.5 mmHg — ABNORMAL LOW (ref 83.0–108.0)

## 2017-04-17 LAB — SPIROMETRY WITH GRAPH
FEF 25-75 Post: 3.1 L/sec
FEF 25-75 Pre: 1.78 L/sec
FEF2575-%Change-Post: 73 %
FEF2575-%Pred-Post: 218 %
FEF2575-%Pred-Pre: 125 %
FEV1-%Change-Post: 6 %
FEV1-%Pred-Post: 112 %
FEV1-%Pred-Pre: 106 %
FEV1-Post: 1.73 L
FEV1-Pre: 1.63 L
FEV1FVC-%Change-Post: 11 %
FEV1FVC-%Pred-Pre: 111 %
FEV6-%Change-Post: -4 %
FEV6-%Pred-Post: 95 %
FEV6-%Pred-Pre: 99 %
FEV6-Post: 1.8 L
FEV6-Pre: 1.89 L
FEV6FVC-%Pred-Post: 105 %
FEV6FVC-%Pred-Pre: 105 %
FVC-%Change-Post: -5 %
FVC-%Pred-Post: 90 %
FVC-%Pred-Pre: 95 %
FVC-Post: 1.8 L
FVC-Pre: 1.9 L
Post FEV1/FVC ratio: 96 %
Post FEV6/FVC ratio: 100 %
Pre FEV1/FVC ratio: 86 %
Pre FEV6/FVC Ratio: 100 %

## 2017-04-17 LAB — SURGICAL PCR SCREEN
MRSA, PCR: NEGATIVE
Staphylococcus aureus: NEGATIVE

## 2017-04-17 LAB — GLUCOSE, CAPILLARY
GLUCOSE-CAPILLARY: 231 mg/dL — AB (ref 65–99)
GLUCOSE-CAPILLARY: 164 mg/dL — AB (ref 65–99)
GLUCOSE-CAPILLARY: 239 mg/dL — AB (ref 65–99)
Glucose-Capillary: 173 mg/dL — ABNORMAL HIGH (ref 65–99)
Glucose-Capillary: 238 mg/dL — ABNORMAL HIGH (ref 65–99)
Glucose-Capillary: 231 mg/dL — ABNORMAL HIGH (ref 65–99)
Glucose-Capillary: 212 mg/dL — ABNORMAL HIGH (ref 65–99)

## 2017-04-17 LAB — CBC
HCT: 32.5 % — ABNORMAL LOW (ref 36.0–46.0)
HEMATOCRIT: 32.5 % — AB (ref 36.0–46.0)
HEMOGLOBIN: 10.8 g/dL — AB (ref 12.0–15.0)
HEMOGLOBIN: 10.8 g/dL — AB (ref 12.0–15.0)
MCH: 26.9 pg (ref 26.0–34.0)
MCH: 27.1 pg (ref 26.0–34.0)
MCHC: 33.2 g/dL (ref 30.0–36.0)
MCHC: 33.2 g/dL (ref 30.0–36.0)
MCV: 81 fL (ref 78.0–100.0)
MCV: 81.5 fL (ref 78.0–100.0)
PLATELETS: 218 10*3/uL (ref 150–400)
Platelets: 217 10*3/uL (ref 150–400)
RBC: 3.99 MIL/uL (ref 3.87–5.11)
RBC: 4.01 MIL/uL (ref 3.87–5.11)
RDW: 13.8 % (ref 11.5–15.5)
RDW: 13.8 % (ref 11.5–15.5)
WBC: 7.5 10*3/uL (ref 4.0–10.5)
WBC: 7.6 10*3/uL (ref 4.0–10.5)

## 2017-04-17 LAB — URINALYSIS, ROUTINE W REFLEX MICROSCOPIC
Bacteria, UA: NONE SEEN
Bilirubin Urine: NEGATIVE
Glucose, UA: NEGATIVE mg/dL
Hgb urine dipstick: NEGATIVE
Ketones, ur: NEGATIVE mg/dL
Nitrite: NEGATIVE
Protein, ur: NEGATIVE mg/dL
RBC / HPF: NONE SEEN RBC/hpf (ref 0–5)
Specific Gravity, Urine: 1.023 (ref 1.005–1.030)
pH: 5 (ref 5.0–8.0)

## 2017-04-17 LAB — TSH: TSH: 2.107 u[IU]/mL (ref 0.350–4.500)

## 2017-04-17 LAB — APTT: aPTT: 31 seconds (ref 24–36)

## 2017-04-17 LAB — HEPARIN LEVEL (UNFRACTIONATED): HEPARIN UNFRACTIONATED: 0.28 [IU]/mL — AB (ref 0.30–0.70)

## 2017-04-17 SURGERY — LEFT HEART CATH AND CORONARY ANGIOGRAPHY
Anesthesia: LOCAL

## 2017-04-17 MED ORDER — MIDAZOLAM HCL 2 MG/2ML IJ SOLN
INTRAMUSCULAR | Status: DC | PRN
  Administered 2017-04-17: 1 mg via INTRAVENOUS

## 2017-04-17 MED ORDER — HEPARIN SODIUM (PORCINE) 1000 UNIT/ML IJ SOLN
INTRAMUSCULAR | Status: AC
  Filled 2017-04-17: qty 1

## 2017-04-17 MED ORDER — IOPAMIDOL (ISOVUE-370) INJECTION 76%
INTRAVENOUS | Status: DC | PRN
  Administered 2017-04-17: 80 mL via INTRA_ARTERIAL

## 2017-04-17 MED ORDER — PLASMA-LYTE 148 IV SOLN
INTRAVENOUS | Status: AC
  Administered 2017-04-18: 500 mL
  Filled 2017-04-17: qty 2.5

## 2017-04-17 MED ORDER — VERAPAMIL HCL 2.5 MG/ML IV SOLN
INTRAVENOUS | Status: AC
  Filled 2017-04-17: qty 2

## 2017-04-17 MED ORDER — IOPAMIDOL (ISOVUE-370) INJECTION 76%
INTRAVENOUS | Status: AC
  Filled 2017-04-17: qty 100

## 2017-04-17 MED ORDER — TRANEXAMIC ACID (OHS) BOLUS VIA INFUSION
15.0000 mg/kg | INTRAVENOUS | Status: AC
  Administered 2017-04-18: 1102.5 mg via INTRAVENOUS
  Filled 2017-04-17: qty 1103

## 2017-04-17 MED ORDER — ALPRAZOLAM 0.25 MG PO TABS: 0.2500 mg | ORAL_TABLET | ORAL | Status: DC | PRN

## 2017-04-17 MED ORDER — HEPARIN (PORCINE) IN NACL 2-0.9 UNIT/ML-% IJ SOLN
INTRAMUSCULAR | Status: AC | PRN
  Administered 2017-04-17: 1000 mL

## 2017-04-17 MED ORDER — HEPARIN (PORCINE) IN NACL 2-0.9 UNIT/ML-% IJ SOLN
INTRAMUSCULAR | Status: AC
  Filled 2017-04-17: qty 1000

## 2017-04-17 MED ORDER — SODIUM CHLORIDE 0.9% FLUSH
3.0000 mL | Freq: Two times a day (BID) | INTRAVENOUS | Status: DC
  Administered 2017-04-17: 3 mL via INTRAVENOUS

## 2017-04-17 MED ORDER — LIDOCAINE HCL (PF) 1 % IJ SOLN
INTRAMUSCULAR | Status: DC | PRN
  Administered 2017-04-17: 2 mL

## 2017-04-17 MED ORDER — NITROGLYCERIN IN D5W 200-5 MCG/ML-% IV SOLN
2.0000 ug/min | INTRAVENOUS | Status: DC
  Filled 2017-04-17: qty 250

## 2017-04-17 MED ORDER — DOPAMINE-DEXTROSE 3.2-5 MG/ML-% IV SOLN
0.0000 ug/kg/min | INTRAVENOUS | Status: AC
  Administered 2017-04-18: 2.5 ug/kg/min via INTRAVENOUS
  Filled 2017-04-17: qty 250

## 2017-04-17 MED ORDER — BISACODYL 5 MG PO TBEC
5.0000 mg | DELAYED_RELEASE_TABLET | Freq: Once | ORAL | Status: AC
  Administered 2017-04-17: 5 mg via ORAL
  Filled 2017-04-17: qty 1

## 2017-04-17 MED ORDER — DIAZEPAM 2 MG PO TABS
2.0000 mg | ORAL_TABLET | Freq: Once | ORAL | Status: AC
  Administered 2017-04-18: 2 mg via ORAL
  Filled 2017-04-17: qty 1

## 2017-04-17 MED ORDER — SODIUM CHLORIDE 0.9 % IV SOLN
INTRAVENOUS | Status: DC
  Filled 2017-04-17: qty 30

## 2017-04-17 MED ORDER — FENTANYL CITRATE (PF) 100 MCG/2ML IJ SOLN
INTRAMUSCULAR | Status: DC | PRN
  Administered 2017-04-17: 25 ug via INTRAVENOUS

## 2017-04-17 MED ORDER — SODIUM CHLORIDE 0.9 % IV SOLN: 250.0000 mL | INTRAVENOUS | Status: DC | PRN

## 2017-04-17 MED ORDER — SODIUM CHLORIDE 0.9% FLUSH
3.0000 mL | INTRAVENOUS | Status: DC | PRN
Start: 2017-04-17 — End: 2017-04-18

## 2017-04-17 MED ORDER — TRANEXAMIC ACID (OHS) PUMP PRIME SOLUTION
2.0000 mg/kg | INTRAVENOUS | Status: DC
  Filled 2017-04-17: qty 1.47

## 2017-04-17 MED ORDER — SODIUM CHLORIDE 0.9 % IV SOLN: INTRAVENOUS | Status: AC

## 2017-04-17 MED ORDER — INSULIN ASPART 100 UNIT/ML ~~LOC~~ SOLN
2.0000 [IU] | Freq: Once | SUBCUTANEOUS | Status: AC
  Administered 2017-04-17: 2 [IU] via SUBCUTANEOUS

## 2017-04-17 MED ORDER — HEPARIN (PORCINE) IN NACL 100-0.45 UNIT/ML-% IJ SOLN
1000.0000 [IU]/h | INTRAMUSCULAR | Status: DC
  Administered 2017-04-17: 900 [IU]/h via INTRAVENOUS
  Filled 2017-04-17: qty 250

## 2017-04-17 MED ORDER — MIDAZOLAM HCL 2 MG/2ML IJ SOLN
INTRAMUSCULAR | Status: AC
  Filled 2017-04-17: qty 2

## 2017-04-17 MED ORDER — HEPARIN SODIUM (PORCINE) 1000 UNIT/ML IJ SOLN
INTRAMUSCULAR | Status: DC | PRN
  Administered 2017-04-17: 4000 [IU] via INTRAVENOUS

## 2017-04-17 MED ORDER — MAGNESIUM SULFATE 50 % IJ SOLN
40.0000 meq | INTRAMUSCULAR | Status: DC
  Filled 2017-04-17: qty 10

## 2017-04-17 MED ORDER — SODIUM CHLORIDE 0.9 % IV SOLN
INTRAVENOUS | Status: AC
  Administered 2017-04-18: 3.1 [IU]/h via INTRAVENOUS
  Filled 2017-04-17: qty 1

## 2017-04-17 MED ORDER — TIROFIBAN HCL IV 12.5 MG/250 ML
0.0750 ug/kg/min | INTRAVENOUS | Status: DC
  Administered 2017-04-17: 0.075 ug/kg/min via INTRAVENOUS
  Filled 2017-04-17: qty 250

## 2017-04-17 MED ORDER — DEXTROSE 5 % IV SOLN
750.0000 mg | INTRAVENOUS | Status: DC
  Filled 2017-04-17: qty 750

## 2017-04-17 MED ORDER — TIROFIBAN (AGGRASTAT) BOLUS VIA INFUSION
INTRAVENOUS | Status: DC | PRN
  Administered 2017-04-17: 1837.5 ug via INTRAVENOUS

## 2017-04-17 MED ORDER — DEXTROSE 5 % IV SOLN
1.5000 g | INTRAVENOUS | Status: AC
  Administered 2017-04-18: 1.5 g via INTRAVENOUS
  Administered 2017-04-18: .75 g via INTRAVENOUS
  Filled 2017-04-17: qty 1.5

## 2017-04-17 MED ORDER — TRANEXAMIC ACID 1000 MG/10ML IV SOLN
1.5000 mg/kg/h | INTRAVENOUS | Status: AC
  Administered 2017-04-18: 1.5 mg/kg/h via INTRAVENOUS
  Filled 2017-04-17: qty 25

## 2017-04-17 MED ORDER — POTASSIUM CHLORIDE 2 MEQ/ML IV SOLN
80.0000 meq | INTRAVENOUS | Status: DC
  Filled 2017-04-17: qty 40

## 2017-04-17 MED ORDER — ALBUTEROL SULFATE (2.5 MG/3ML) 0.083% IN NEBU
2.5000 mg | INHALATION_SOLUTION | Freq: Once | RESPIRATORY_TRACT | Status: AC
  Administered 2017-04-17: 2.5 mg via RESPIRATORY_TRACT

## 2017-04-17 MED ORDER — VANCOMYCIN HCL 10 G IV SOLR
1250.0000 mg | INTRAVENOUS | Status: AC
  Administered 2017-04-18: 1250 mg via INTRAVENOUS
  Filled 2017-04-17: qty 1250

## 2017-04-17 MED ORDER — DEXMEDETOMIDINE HCL IN NACL 400 MCG/100ML IV SOLN
0.1000 ug/kg/h | INTRAVENOUS | Status: AC
  Administered 2017-04-18: .2 ug/kg/h via INTRAVENOUS
  Filled 2017-04-17 (×2): qty 100

## 2017-04-17 MED ORDER — CHLORHEXIDINE GLUCONATE 4 % EX LIQD
60.0000 mL | Freq: Once | CUTANEOUS | Status: AC
  Administered 2017-04-17: 4 via TOPICAL
  Filled 2017-04-17: qty 60

## 2017-04-17 MED ORDER — SODIUM CHLORIDE 0.9 % IV SOLN
30.0000 ug/min | INTRAVENOUS | Status: AC
  Administered 2017-04-18: 15 ug/min via INTRAVENOUS
  Filled 2017-04-17: qty 2

## 2017-04-17 MED ORDER — CHLORHEXIDINE GLUCONATE 4 % EX LIQD
60.0000 mL | Freq: Once | CUTANEOUS | Status: AC
  Administered 2017-04-18: 4 via TOPICAL
  Filled 2017-04-17: qty 60

## 2017-04-17 MED ORDER — TIROFIBAN HCL IN NACL 5-0.9 MG/100ML-% IV SOLN
INTRAVENOUS | Status: AC
  Filled 2017-04-17: qty 100

## 2017-04-17 MED ORDER — TIROFIBAN HCL IN NACL 5-0.9 MG/100ML-% IV SOLN
INTRAVENOUS | Status: AC | PRN
  Administered 2017-04-17: 0.15 ug/kg/min via INTRAVENOUS

## 2017-04-17 MED ORDER — TEMAZEPAM 15 MG PO CAPS
15.0000 mg | ORAL_CAPSULE | Freq: Once | ORAL | Status: AC | PRN
  Administered 2017-04-17: 15 mg via ORAL
  Filled 2017-04-17: qty 1

## 2017-04-17 MED ORDER — EPINEPHRINE PF 1 MG/ML IJ SOLN
0.0000 ug/min | INTRAVENOUS | Status: DC
  Filled 2017-04-17: qty 4

## 2017-04-17 MED ORDER — VERAPAMIL HCL 2.5 MG/ML IV SOLN
INTRAVENOUS | Status: DC | PRN
  Administered 2017-04-17: 10 mL via INTRA_ARTERIAL

## 2017-04-17 MED ORDER — METOPROLOL TARTRATE 12.5 MG HALF TABLET
12.5000 mg | ORAL_TABLET | Freq: Once | ORAL | Status: AC
  Administered 2017-04-18: 12.5 mg via ORAL
  Filled 2017-04-17: qty 1

## 2017-04-17 MED ORDER — FENTANYL CITRATE (PF) 100 MCG/2ML IJ SOLN
INTRAMUSCULAR | Status: AC
  Filled 2017-04-17: qty 2

## 2017-04-17 MED ORDER — CHLORHEXIDINE GLUCONATE 0.12 % MT SOLN
15.0000 mL | Freq: Once | OROMUCOSAL | Status: AC
  Administered 2017-04-18: 15 mL via OROMUCOSAL
  Filled 2017-04-17: qty 15

## 2017-04-17 SURGICAL SUPPLY — 9 items
CATH INFINITI 5FR ANG PIGTAIL (CATHETERS) ×1 IMPLANT
CATH OPTITORQUE TIG 4.0 5F (CATHETERS) ×1 IMPLANT
DEVICE RAD COMP TR BAND LRG (VASCULAR PRODUCTS) ×1 IMPLANT
GLIDESHEATH SLEND A-KIT 6F 22G (SHEATH) ×1 IMPLANT
KIT HEART LEFT (KITS) ×2 IMPLANT
PACK CARDIAC CATHETERIZATION (CUSTOM PROCEDURE TRAY) ×2 IMPLANT
SYR MEDRAD MARK V 150ML (SYRINGE) ×2 IMPLANT
TRANSDUCER W/STOPCOCK (MISCELLANEOUS) ×2 IMPLANT
TUBING CIL FLEX 10 FLL-RA (TUBING) ×2 IMPLANT

## 2017-04-17 NOTE — Anesthesia Preprocedure Evaluation (Addendum)
Anesthesia Evaluation  Patient identified by MRN, date of birth, ID band Patient awake    Reviewed: Allergy & Precautions, H&P , NPO status , Patient's Chart, lab work & pertinent test results  Airway Mallampati: II  TM Distance: >3 FB Neck ROM: Full    Dental no notable dental hx. (+) Teeth Intact, Dental Advisory Given   Pulmonary neg pulmonary ROS, former smoker,    Pulmonary exam normal breath sounds clear to auscultation       Cardiovascular Exercise Tolerance: Good hypertension, Pt. on medications + CAD and + Past MI   Rhythm:Regular Rate:Normal     Neuro/Psych CVA negative psych ROS   GI/Hepatic negative GI ROS, Neg liver ROS,   Endo/Other  diabetes, Insulin Dependent  Renal/GU negative Renal ROS  negative genitourinary   Musculoskeletal   Abdominal   Peds  Hematology negative hematology ROS (+)   Anesthesia Other Findings   Reproductive/Obstetrics negative OB ROS                            Anesthesia Physical Anesthesia Plan  ASA: IV  Anesthesia Plan: General   Post-op Pain Management:    Induction: Intravenous  PONV Risk Score and Plan: Treatment may vary due to age or medical condition  Airway Management Planned: Oral ETT  Additional Equipment: Arterial line, CVP, PA Cath, TEE and Ultrasound Guidance Line Placement  Intra-op Plan:   Post-operative Plan: Post-operative intubation/ventilation  Informed Consent: I have reviewed the patients History and Physical, chart, labs and discussed the procedure including the risks, benefits and alternatives for the proposed anesthesia with the patient or authorized representative who has indicated his/her understanding and acceptance.   Dental advisory given  Plan Discussed with: CRNA  Anesthesia Plan Comments:        Anesthesia Quick Evaluation

## 2017-04-17 NOTE — Consult Note (Signed)
[address]       [place] [address]             [phone]        Andrea [name] [facility] Medical Record #[id number] Date of Birth: 1946-05-09  Referring: Harding,david Primary Care: Laurena Slimmer, MD  Chief Complaint:    Chief Complaint  Patient presents with  . Chest Pain   Patient examined, coronary angiograms and echocardiogram images personally reviewed and counseled with patient and family  History of Present Illness:     Very nice 71 year old AA diabetic reformed smoker admitted to the hospital 3 days ago with unstable angina and positive cardiac enzymes--non-STEMI. Her chest pain has been increasing in frequency and intensity over the past few weeks. She has been taking increasing amounts of nitroglycerin but was having side effects including dizziness. Since admission she has had one brief episode of angina. On admission her EKG showed nonspecific changes in her troponin was minimally elevated. Echocardiogram showed LVH with normal LV systolic function, no significant valvular disease. Coronary arteriogram performed today shows severe three-vessel coronary artery disease with 95% stenosis of the LAD-diagonal, 95% proximal RCA stenosis, 99% stenosis of the obtuse marginal. LVEDP is 19. Because the patient's diabetes and severe three-vessel CAD she was recommended for CABG. She has been receiving doses of IV Aggrastat while in the hospital.  The patient stopped smoking in 1998. At that time she had a mild left body CVA. Carotid duplex scan in May 2018 showed no significant carotid artery disease.  The patient has had previous cholecystectomy and rotator cuff surgery without anesthesia problems or bleeding problems. PFTs-spirometry are adequate for sternotomy.  Current Activity/ Functional Status: The patient is retired from Agricultural consultant at [employer]. She lives with her family, and has been very active up until her recent cardiac symptoms.   Zubrod Score: At the time of surgery this patient's most appropriate activity status/level should be described as:     0    Normal activity, no symptoms     1    Restricted in physical strenuous activity but ambulatory, able to do out light work     2    Ambulatory and capable of self care, unable to do work activities, up and about                 more than 50%  Of the time                                3    Only limited self care, in bed greater than 50% of waking hours     4    Completely disabled, no self care, confined to bed or chair     5    Moribund  Past Medical History:  Diagnosis Date  . Diabetes mellitus   . Hypertension   . Stroke [facility])     Past Surgical History:  Procedure Laterality Date  . CHOLECYSTECTOMY    . LEFT HEART CATH AND CORONARY ANGIOGRAPHY N/A 04/17/2017   Procedure: LEFT HEART CATH AND CORONARY ANGIOGRAPHY;  Surgeon: Marykay Lex, MD;  Location: [facility] INVASIVE CV LAB;  Service: Cardiovascular;  Laterality: N/A;  . ROTATOR CUFF REPAIR     left    History  Smoking Status  . Former Smoker  . Packs/day: 0.50  . Years: 20.00  . Types:  Cigarettes  . Quit date: 01/02/1997  Smokeless Tobacco  . Never Used    History  Alcohol Use No    Social History   Social History  . Marital status: Married    Spouse name: N/A  . Number of children: N/A  . Years of education: N/A   Occupational History  . Not on file.   Social History Main Topics  . Smoking status: Former Smoker    Packs/day: 0.50    Years: 20.00    Types: Cigarettes    Quit date: 01/02/1997  . Smokeless tobacco: Never Used  . Alcohol use No  . Drug use: No  . Sexual activity: Not on file   Other Topics Concern  . Not on file   Social History Narrative  . No narrative on file    No Known Allergies  Current Facility-Administered Medications  Medication Dose Route Frequency Provider Last Rate Last Dose  . 0.9 %  sodium chloride infusion  250 mL Intravenous PRN  Marykay LexHarding, David W, MD      . acetaminophen (TYLENOL) tablet 650 mg  650 mg Oral Q4H PRN Robbie LisSimmons, Brittainy M, PA-C      . irbesartan (AVAPRO) tablet 300 mg  300 mg Oral Daily Hillis RangeAllred, James, MD   300 mg at 04/16/17 1208   And  . amLODipine (NORVASC) tablet 10 mg  10 mg Oral Daily Allred, Fayrene FearingJames, MD   10 mg at 04/16/17 1207   And  . hydrochlorothiazide (HYDRODIURIL) tablet 25 mg  25 mg Oral Daily Hillis RangeAllred, James, MD   25 mg at 04/16/17 1207  . [START ON 04/18/2017] aspirin EC tablet 81 mg  81 mg Oral Daily Allred, James, MD      . atorvastatin (LIPITOR) tablet 80 mg  80 mg Oral q1800 Allayne ButcherSimmons, Brittainy M, PA-C   80 mg at 04/16/17 1633  . [START ON 04/18/2017] cefUROXime (ZINACEF) 1.5 g in dextrose 5 % 50 mL IVPB  1.5 g Intravenous To OR Allred, Fayrene FearingJames, MD      . Melene Muller[START ON 04/18/2017] cefUROXime (ZINACEF) 750 mg in dextrose 5 % 50 mL IVPB  750 mg Intravenous To OR Allred, Fayrene FearingJames, MD      . Melene Muller[START ON 04/18/2017] dexmedetomidine (PRECEDEX) 400 MCG/100ML (4 mcg/mL) infusion  0.1-0.7 mcg/kg/hr Intravenous To OR Allred, Fayrene FearingJames, MD      . Melene Muller[START ON 04/18/2017] DOPamine (INTROPIN) 800 mg in dextrose 5 % 250 mL (3.2 mg/mL) infusion  0-10 mcg/kg/min Intravenous To OR Allred, Fayrene FearingJames, MD      . Melene Muller[START ON 04/18/2017] EPINEPHrine (ADRENALIN) 4 mg in dextrose 5 % 250 mL (0.016 mg/mL) infusion  0-10 mcg/min Intravenous To OR Allred, Fayrene FearingJames, MD      . Melene Muller[START ON 04/18/2017] heparin 2,500 Units, papaverine 30 mg in electrolyte-148 (PLASMALYTE-148) 500 mL irrigation   Irrigation To OR Allred, Fayrene FearingJames, MD      . Melene Muller[START ON 04/18/2017] heparin 30,000 units/NS 1000 mL solution for CELLSAVER   Other To OR Allred, James, MD      . insulin aspart (novoLOG) injection 0-15 Units  0-15 Units Subcutaneous TID WC Robbie LisSimmons, Brittainy M, PA-C   5 Units at 04/17/17 1256  . [START ON 04/18/2017] insulin regular (NOVOLIN R,HUMULIN R) 100 Units in sodium chloride 0.9 % 100 mL (1 Units/mL) infusion   Intravenous To OR Allred, Fayrene FearingJames, MD      . Melene Muller[START ON  04/18/2017] magnesium sulfate (IV Push/IM) injection 40 mEq  40 mEq Other To OR Hillis RangeAllred, James, MD      .  metoprolol tartrate (LOPRESSOR) tablet 12.5 mg  12.5 mg Oral BID Robbie Lis M, PA-C   12.5 mg at 04/16/17 2044  . nitroGLYCERIN (NITROSTAT) SL tablet 0.4 mg  0.4 mg Sublingual Q5 Min x 3 PRN Robbie Lis M, PA-C      . nitroGLYCERIN 50 mg in dextrose 5 % 250 mL (0.2 mg/mL) infusion  0-200 mcg/min Intravenous Titrated Leone Brand, NP 3 mL/hr at 04/17/17 1400 10 mcg/min at 04/17/17 1400  . [START ON 04/18/2017] nitroGLYCERIN 50 mg in dextrose 5 % 250 mL (0.2 mg/mL) infusion  2-200 mcg/min Intravenous To OR Allred, James, MD      . ondansetron (ZOFRAN) injection 4 mg  4 mg Intravenous Q6H PRN Robbie Lis M, PA-C      . [START ON 04/18/2017] phenylephrine (NEO-SYNEPHRINE) 20 mg in sodium chloride 0.9 % 250 mL (0.08 mg/mL) infusion  30-200 mcg/min Intravenous To OR Allred, Fayrene Fearing, MD      . Melene Muller ON 04/18/2017] potassium chloride injection 80 mEq  80 mEq Other To OR Allred, James, MD      . sodium chloride flush (NS) 0.9 % injection 3 mL  3 mL Intravenous Q12H Marykay Lex, MD      . sodium chloride flush (NS) 0.9 % injection 3 mL  3 mL Intravenous PRN Marykay Lex, MD      . tirofiban (AGGRASTAT) infusion 50 mcg/mL 250 mL  0.075 mcg/kg/min Intravenous Continuous Adline Potter, RPH 6.6 mL/hr at 04/17/17 1422 0.075 mcg/kg/min at 04/17/17 1422  . [START ON 04/18/2017] tranexamic acid (CYKLOKAPRON) 2,500 mg in sodium chloride 0.9 % 250 mL (10 mg/mL) infusion  1.5 mg/kg/hr Intravenous To OR Allred, Fayrene Fearing, MD      . Melene Muller ON 04/18/2017] tranexamic acid (CYKLOKAPRON) bolus via infusion - over 30 minutes 1,102.5 mg  15 mg/kg Intravenous To OR Allred, Fayrene Fearing, MD      . Melene Muller ON 04/18/2017] tranexamic acid (CYKLOKAPRON) pump prime solution 147 mg  2 mg/kg Intracatheter To OR Allred, Fayrene Fearing, MD      . Melene Muller ON 04/18/2017] vancomycin (VANCOCIN) 1,250 mg in sodium chloride 0.9 % 250 mL  IVPB  1,250 mg Intravenous To OR Allred, Fayrene Fearing, MD        Prescriptions Prior to Admission  Medication Sig Dispense Refill Last Dose  . aspirin 81 MG tablet Take 81 mg by mouth daily.   04/15/2017 at Unknown time  . Cholecalciferol (VITAMIN D) 2000 UNITS tablet Take 2,000 Units by mouth daily.   04/15/2017 at Unknown time  . insulin detemir (LEVEMIR) 100 UNIT/ML injection Inject 20-100 Units into the skin See admin instructions. Sliding Scale   04/15/2017 at Unknown time  . naproxen sodium (ANAPROX) 220 MG tablet Take 220 mg by mouth 2 (two) times daily as needed. For pain.   PRN  . nitroGLYCERIN (NITROSTAT) 0.4 MG SL tablet Place 1 tablet (0.4 mg total) under the tongue every 5 (five) minutes as needed for chest pain. Take if chest pressure is present after 3-5 min of rest 25 tablet 3 PRN  . Olmesartan-Amlodipine-HCTZ 40-10-25 MG TABS Take 1 tablet by mouth daily.  1 04/15/2017 at Unknown time  . sitaGLIPtan-metformin (JANUMET) 50-1000 MG per tablet Take 1 tablet by mouth 2 (two) times daily with a meal.   04/15/2017 at Unknown time  . VESICARE 10 MG tablet Take 10 mg by mouth daily.  5 04/15/2017 at Unknown time  . vitamin E 400 UNIT capsule Take 400 Units by mouth daily.  04/15/2017 at Unknown time  . ONETOUCH VERIO test strip Use as directed four times daily to test blood sugar level.  12 Taking    Family History  Problem Relation Age of Onset  . Heart attack Father   . Hypertension Father   . Heart attack Brother   . Hypertension Brother   . Hypertension Mother   . Stroke Mother   . Healthy Brother      Review of Systems:   No history thoracic trauma Family members have had CABG She is right-hand dominant She stopped smoking 20 years ago    Cardiac Review of Systems: Y or N  Chest Pain [   y ]  Resting SOB [ n  ] Exertional SOB  [ y ]  Pollyann Kennedy Milo.Brash  ]   Pedal Edema [  n ]    Palpitations [ n ] Syncope  [  n]   Presyncope [  n ]  General Review of Systems: [Y] = yes [   ]=no Constitional: recent weight change [  ]; anorexia [  ]; fatigue [  ]; nausea [  ]; night sweats [  ]; fever [  ]; or chills [  ]                                                               Dental: poor dentition[  ]; Last Dentist visit: 1 year  Eye : blurred vision [  ]; diplopia [   ]; vision changes [  ];  Amaurosis fugax[  ]; Resp: cough [  ];  wheezing[  ];  hemoptysis[  ]; shortness of breath[ y ]; paroxysmal nocturnal dyspnea[  ]; dyspnea on exertion[  ]; or orthopnea[  ];  GI:  gallstones[ y ], vomiting[  ];  dysphagia[  ]; melena[  ];  hematochezia [  ]; heartburn[  ];   Hx of  Colonoscopy[ y ]; GU: kidney stones [  ]; hematuria[  ];   dysuria [  ];  nocturia[  ];  history of     obstruction [  ]; urinary frequency [  ]             Skin: rash, swelling[  ];, hair loss[  ];  peripheral edema[  ];  or itching[  ]; Musculosketetal: myalgias[  ];  joint swelling[  ];  joint erythema[  ];  joint pain[  ];  back pain[  ];  Heme/Lymph: bruising[  ];  bleeding[  ];  anemia[  ];  Neuro: TIA[  ];  headaches[ y ];  stroke[ y ];  vertigo[  ];  seizures[  ];   paresthesias[  ];  difficulty walking[  ];  Psych:depression[  ]; anxiety[  ];  Endocrine: diabetes[ y ];  thyroid dysfunction[  ];  Immunizations: Flu [  ]; Pneumococcal[  ];  Other:  Physical Exam: BP 127/63   Pulse 66   Temp 98.2 F (36.8 C) (Oral)   Resp (!) 22   Ht  (1.549 m)   Wt 162 lb (73.5 kg)   SpO2 94%   BMI 30.61 kg/m        Physical Exam  General: Small 71 year old female resting comfortably supine in bed in no distress HEENT: Normocephalic pupils equal ,  dentition adequate Neck: Supple without JVD, adenopathy, or bruit Chest: Clear to auscultation, symmetrical breath sounds, no rhonchi, no tenderness             or deformity Cardiovascular: Regular rate and rhythm, no murmur, no gallop, peripheral pulses             palpable in all extremities Abdomen:  Soft, nontender, no palpable mass or  organomegaly Extremities: Warm, well-perfused, no clubbing cyanosis edema or tenderness,              no venous stasis changes of the legs Rectal/GU: Deferred Neuro: Grossly non--focal and symmetrical throughout Skin: Clean and dry without rash or ulceration   Diagnostic Studies & Laboratory data:     Recent Radiology Findings:   Dg Chest 2 View  Result Date: 04/16/2017 CLINICAL DATA:  Chest pain beginning yesterday afternoon while walking, relieved with nitroglycerin. Similar chest pain this morning. History of hypertension, diabetes. EXAM: CHEST  2 VIEW COMPARISON:  Chest radiograph Jan 03, 2012 FINDINGS: Cardiac silhouette is normal in size. Mediastinal silhouette is nonsuspicious. Similarly tortuous aorta seen with chronic hypertension. No pleural effusion or focal consolidation. No pneumothorax. Mild degenerative change of thoracic spine. Surgical clips in the included right abdomen compatible with cholecystectomy. IMPRESSION: Stable examination:  No acute cardiopulmonary process. Electronically Signed   By: Awilda Metro M.D.   On: 04/16/2017 03:36     I have independently reviewed the above radiologic studies.  Recent Lab Findings: Lab Results  Component Value Date   WBC 7.6 04/17/2017   HGB 10.8 (L) 04/17/2017   HCT 32.5 (L) 04/17/2017   PLT 218 04/17/2017   GLUCOSE 177 (H) 04/17/2017   CHOL 117 04/17/2017   TRIG 207 (H) 04/17/2017   HDL 35 (L) 04/17/2017   LDLCALC 41 04/17/2017   ALT 39 (H) 01/03/2012   AST 36 01/03/2012   NA 135 04/17/2017   K 3.7 04/17/2017   CL 101 04/17/2017   CREATININE 1.18 (H) 04/17/2017   BUN 19 04/17/2017   CO2 25 04/17/2017   INR 1.11 04/17/2017   HGBA1C 9.7 (H) 01/04/2012      Assessment / Plan:      Non-ST elevation MI, severe three-vessel coronary disease with unstable angina Preserved LV systolic function Suboptimally controlled diabetes mellitus-hemoglobin A1c 9.7 Hypertension with history of mild  [reversible ]right-sided  weakness from stroke  Surgical coronary revascularization has been recommended by her cardiologist I discussed the procedure including benefits alternatives and risks with the patient and family She demonstrates understanding and agrees to proceed with surgery which we scheduled for a.m. on September 11.      @ 04/17/2017 2:22 PM

## 2017-04-17 NOTE — Progress Notes (Addendum)
Growth noted in right arm proximal to RT band at 1045. 2cc added to TR band and pressure applied x 10 min. reinforced with coban. Area is reduced and shows no continued growth at this time. aggrastat off at 1018 per order Dr Herbie BaltimoreHarding. To restart in 20 min.

## 2017-04-17 NOTE — Interval H&P Note (Signed)
History and Physical Interval Note:  04/17/2017 7:12 AM  Andrea [name]  has presented today for surgery, with the diagnosis of NSTEMI  The various methods of treatment have been discussed with the patient and family. After consideration of risks, benefits and other options for treatment, the patient has consented to  Procedure(s): LEFT HEART CATH AND CORONARY ANGIOGRAPHY (N/A) with possible PERCUTANEOUS CORONARY INTERVENTION as a surgical intervention .  The patient's history has been reviewed, patient examined, no change in status, stable for surgery.  I have reviewed the patient's chart and labs.  Questions were answered to the patient's satisfaction.     Cath Lab Visit (complete for each Cath Lab visit)  Clinical Evaluation Leading to the Procedure:   ACS: Yes.    Non-ACS:    Anginal Classification: CCS IV  Anti-ischemic medical therapy: Maximal Therapy (2 or more classes of medications) - MAX TOLERATED  Non-Invasive Test Results: No non-invasive testing performed  Prior CABG: No previous CABG    Bryan Lemmaavid Harding

## 2017-04-17 NOTE — Progress Notes (Deleted)
Cards fellow paged to notify of K 3.1 from PM labs and ask if want to replace. Awaiting callback.

## 2017-04-17 NOTE — Progress Notes (Addendum)
Cards fellow paged to discuss pt's CBG/insulin per family request. Awaiting callback.  MD called back at 22:10, gave verbal order for 2 units Novolog once.  Will administer and continue to monitor.

## 2017-04-17 NOTE — H&P (View-Only) (Signed)
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  She has progressive angina over the past 2 months, significantly worse over the past 2 weeks especially as she has needed to walk longer distances at [employer] football games. Last evening, she was awakened by pain at rest and came to the ED and admitted with NSTEACS.  Exam is unremarkable  EKG during pain is significant for diffuse ischemia with ST elevation in aVR and generalized depression elsewhere, raising concern for left main disease or left main equivalent.  Plan should be early invasive strategy with coronary angiography planned for a.m. unless recurring symptoms other not controllable on medical therapy. Medical therapy should include beta blocker, IV nitroglycerin, IV heparin, and IV IIb IIIa ( if needed). Would withhold P2 Y 12 inhibitor therapy, unless PCI with stenting, so as not to prolong hospital stay if cardiac surgery is needed.  Discussed approach with the patient and her husband/family including the benefits and risks of cath and PCI.The patient was counseled to undergo left heart catheterization, coronary angiography, and possible percutaneous coronary intervention with stent implantation. The procedural risks and benefits were discussed in detail. The risks discussed included death, stroke, myocardial infarction, life-threatening bleeding, limb ischemia, kidney injury, allergy, and possible emergency cardiac surgery. The risk of these significant complications were estimated to occur less than 1% of the time. After discussion, the patient has agreed to proceed. 

## 2017-04-17 NOTE — Progress Notes (Addendum)
MEDICATION RELATED CONSULT NOTE - INITIAL   Pharmacy Consult for Tirofiban/Heparin Indication: Post-cath/bridge to CABG  No Known Allergies  Patient Measurements: Height: 5\' 1"  (154.9 cm) Weight: 162 lb (73.5 kg) IBW/kg (Calculated) : 47.8 Adjusted Body Weight:   Vital Signs: Temp: 98.2 F (36.8 C) (09/10 1248) Temp Source: Oral (09/10 1248) BP: 124/90 (09/10 1200) Pulse Rate: 74 (09/10 1200) Intake/Output from previous day: 09/09 0701 - 09/10 0700 In: 208.1 [I.V.:208.1] Out: 900 [Urine:900] Intake/Output from this shift: Total I/O In: 409.4 [I.V.:409.4] Out: 200 [Urine:200]  Labs:  Recent Labs  04/16/17 0306 04/17/17 0014  WBC 9.1 7.6  HGB 11.5* 10.8*  HCT 33.1* 32.5*  PLT 237 218  CREATININE 1.12* 1.18*   Estimated Creatinine Clearance: 40.1 mL/min (A) (by C-G formula based on SCr of 1.18 mg/dL (H)).   Microbiology: No results found for this or any previous visit (from the past 720 hour(s)).  Medical History: Past Medical History:  Diagnosis Date  . Diabetes mellitus   . Hypertension   . Stroke [facility](HCC)     Assessment: 71yo female presents with NSTEMI. Cath today shows 3-vessel disease with diffuse involvement of LAD and total occlusion of left circ. Patient is a candidate for CABG. Pharmacy consulted to dose tirofiban in the meantime. Patient already received a bolus this AM with an initial infusion.   Patient's CBC is stable, no bleeding noted, CrCl<60 ml/min, Wt 73.5 kg.   Goal of Therapy:  Adequate antiplatelet therapy pending CABG Heparin goal (0.3-0.5)  Plan:  Tirofiban IV 0.075 mcg/kg/min  Heparin gtt 900 units/hr to start at 1800 tonight (8 hours after sheath removal) Check CBCin 6 hours, then daily Check Heparin level 6 hours after start, then daily Monitor plts, hgb, and s/s of bleeding  Adline PotterSabrina Khiya Friese, PharmD Pharmacy Resident Pager: [phone] 04/17/2017,1:56 PM

## 2017-04-17 NOTE — Progress Notes (Signed)
Progress Note  Patient Name: Andrea [name] Date of Encounter: 04/17/2017  Primary Cardiologist: Katrinka Blazing  Subjective   Seen immediately after cardiac cath, currently chest pain-free. Has severe three-vessel disease with heavy calcification of the left main coronary artery and high-grade stenoses in the proximal LAD and right coronary artery with total occlusion of the left circumflex. Left ventricular systolic function is preserved by both echo and LV angiography and left ventricular filling pressures are essentially normal.  Diagnostic Diagram          Most Recent Value  AO Systolic Pressure 113 mmHg  AO Diastolic Pressure 51 mmHg  AO Mean 74 mmHg  LV Systolic Pressure 114 mmHg  LV Diastolic Pressure 6 mmHg  LV EDP 17 mmHg  Arterial Occlusion Pressure Extended Systolic Pressure 129 mmHg  Arterial Occlusion Pressure Extended Diastolic Pressure 54 mmHg  Arterial Occlusion Pressure Extended Mean Pressure 82 mmHg  Left Ventricular Apex Extended Systolic Pressure 127 mmHg  Left Ventricular Apex Extended Diastolic Pressure 7 mmHg  Left Ventricular Apex Extended EDP Pressure 19 mmHg      Inpatient Medications    Scheduled Meds: . [MAR Hold] irbesartan  300 mg Oral Daily   And  . [MAR Hold] amLODipine  10 mg Oral Daily   And  . [MAR Hold] hydrochlorothiazide  25 mg Oral Daily  . [MAR Hold] aspirin EC  81 mg Oral Daily  . [MAR Hold] atorvastatin  80 mg Oral q1800  . [MAR Hold] insulin aspart  0-15 Units Subcutaneous TID WC  . [MAR Hold] metoprolol tartrate  12.5 mg Oral BID  . sodium chloride flush  3 mL Intravenous Q12H   Continuous Infusions: . sodium chloride    . sodium chloride 500 mL (04/17/17 0852)  . sodium chloride    . heparin Stopped (04/17/17 8295)  . [MAR Hold] nitroGLYCERIN 10 mcg/min (04/17/17 0815)   PRN Meds: sodium chloride, [MAR Hold] acetaminophen, [MAR Hold] nitroGLYCERIN, [MAR Hold] ondansetron (ZOFRAN) IV, sodium chloride flush   Vital Signs      Vitals:   04/17/17 0801 04/17/17 0805 04/17/17 0850 04/17/17 0855  BP: (!) 117/59 120/64 140/67 (!) 135/57  Pulse: 76 76 (!) 58 (!) 58  Resp: 16 18 (!) 21 14  Temp:      TempSrc:      SpO2: 96% 97% 96% 94%  Weight:      Height:        Intake/Output Summary (Last 24 hours) at 04/17/17 0910 Last data filed at 04/17/17 0345  Gross per 24 hour  Intake           208.11 ml  Output              900 ml  Net          -691.89 ml   Filed Weights   04/16/17 0304  Weight: 162 lb (73.5 kg)    Telemetry    Sinus rhythm - Personally Reviewed  ECG    All sinus rhythm, nonspecific ST changes especially seen in leads V5-V6, no Q waves are seen - Personally Reviewed  Physical Exam  Calm, relaxed, lying fully supine without breathing difficulty GEN: No acute distress.   Neck: No JVD Cardiac: RRR, no murmurs, rubs, or gallops.  Respiratory: Clear to auscultation bilaterally. GI: Soft, nontender, non-distended  MS: No edema; No deformity. Neuro:  Nonfocal  Psych: Normal affect   Labs    Chemistry Recent Labs Lab 04/16/17 0306 04/17/17 0014  NA 137 135  K 3.4* 3.7  CL 100* 101  CO2 25 25  GLUCOSE 245* 177*  BUN 19 19  CREATININE 1.12* 1.18*  CALCIUM 10.2 9.1  GFRNONAA 48* 45*  GFRAA 56* 52*  ANIONGAP 12 9     Hematology Recent Labs Lab 04/16/17 0306 04/17/17 0014  WBC 9.1 7.6  RBC 4.06 4.01  HGB 11.5* 10.8*  HCT 33.1* 32.5*  MCV 81.5 81.0  MCH 28.3 26.9  MCHC 34.7 33.2  RDW 13.8 13.8  PLT 237 218    Cardiac Enzymes Recent Labs Lab 04/16/17 0306 04/16/17 0957 04/16/17 1429 04/16/17 2144  TROPONINI 0.03* 2.50* 2.60* 2.52*   No results for input(s): TROPIPOC in the last 168 hours.   BNPNo results for input(s): BNP, PROBNP in the last 168 hours.   DDimer No results for input(s): DDIMER in the last 168 hours.   Radiology    Dg Chest 2 View  Result Date: 04/16/2017 CLINICAL DATA:  Chest pain beginning yesterday afternoon while walking, relieved  with nitroglycerin. Similar chest pain this morning. History of hypertension, diabetes. EXAM: CHEST  2 VIEW COMPARISON:  Chest radiograph Jan 03, 2012 FINDINGS: Cardiac silhouette is normal in size. Mediastinal silhouette is nonsuspicious. Similarly tortuous aorta seen with chronic hypertension. No pleural effusion or focal consolidation. No pneumothorax. Mild degenerative change of thoracic spine. Surgical clips in the included right abdomen compatible with cholecystectomy. IMPRESSION: Stable examination:  No acute cardiopulmonary process. Electronically Signed   By: Awilda Metroourtnay  Bloomer M.D.   On: 04/16/2017 03:36    Cardiac Studies   04/16/2017 CATH Diagnostic Diagram          04/15/2017 echo - Left ventricle: The cavity size was normal. There was mild   concentric hypertrophy. Systolic function was normal. The   estimated ejection fraction was in the range of 55% to 60%. Wall   motion was normal; there were no regional wall motion   abnormalities. Doppler parameters are consistent with abnormal   left ventricular relaxation (grade 1 diastolic dysfunction). - Mitral valve: Moderately calcified annulus. There was mild   regurgitation.   Patient Profile     71 y.o. female with severe multivessel coronary artery disease, preserved left ventricular systolic function and unstable coronary symptoms (small NSTEMI, peak troponin 2.6), on a background of type 2 diabetes mellitus, hypertension and hyperlipidemia and remote stroke  Assessment & Plan    1. CAD: Three-vessel coronary disease with diffuse involvement and proximal LAD stenosis, chronic total occlusion of the left circumflex artery, preserved left ventricular systolic function and diabetes mellitus, best suited for multivessel CABG. Will ask for CV surgery evaluation. On Aggrastat IV in the meantime. Pre-bypass Dopplers ordered. 2. HTN: Well-controlled, prefer beta blockers for blood pressure control, as allowed by heart rate. Chronically  on ARB/thiazide/amlodipine. 3. DM: Insulin requiring, type 2. No recent hemoglobin A1c available. 4. HLP: High-dose atorvastatin, excellent LDL 41. 5. She quit smoking in 1998 and has no history of COPD. Does not use bronchodilators. Will defer need for PFTs to CV surgery.  For questions or updates, please contact [facility] Please consult [email or web address] for contact info under Cardiology/STEMI. Daytime calls, contact the Day Call APP (6a-8a) or assigned team (Teams A-D) provider (7:30a - 5p). All other daytime calls (7:30-5p), contact the Card Master @ [phone].   Nighttime calls, contact the assigned APP (5p-8p) or MD (6:30p-8p). Overnight calls (8p-6a), contact the on call Fellow @ [phone].      Signed, Thurmon FairMihai Terrel Nesheiwat, MD  04/17/2017, 9:10 AM

## 2017-04-17 NOTE — Progress Notes (Signed)
ANTICOAGULATION CONSULT NOTE  Pharmacy Consult for heparin  Indication: chest pain/ACS  No Known Allergies  Patient Measurements: Height: 5\' 1"  (154.9 cm) Weight: 162 lb (73.5 kg) IBW/kg (Calculated) : 47.8 Heparin Dosing Weight: 63.9 kg   Vital Signs: Temp: 97.8 F (36.6 C) (09/09 1505) Temp Source: Oral (09/09 1505) BP: 131/74 (09/09 1505) Pulse Rate: 64 (09/09 1505)  Labs:  Recent Labs  04/16/17 0306 04/16/17 0957 04/16/17 1429 04/16/17 2144 04/17/17 0014  HGB 11.5*  --   --   --  10.8*  HCT 33.1*  --   --   --  32.5*  PLT 237  --   --   --  218  LABPROT  --   --   --   --  14.2  INR  --   --   --   --  1.11  HEPARINUNFRC  --   --  0.18*  --  0.28*  CREATININE 1.12*  --   --   --   --   TROPONINI 0.03* 2.50* 2.60* 2.52*  --    Estimated Creatinine Clearance: 42.3 mL/min (A) (by C-G formula based on SCr of 1.12 mg/dL (H)).  Assessment: 71 yo female admitted with chest pain. Pharmacy consulted to dose heparin.  Heparin level slightly subtherapeutic at 0.28 and no infusion issues per RN. CBC stable and no s/s bleeding noted.   Goal of Therapy:  Heparin level 0.3-0.7 units/ml Monitor platelets by anticoagulation protocol: Yes   Plan:  Increase heparin gtt 1000 units/hr Heparin level in 8 hrs Daily heparin and CBC Monitor for s/s bleeding   York CeriseKatherine Cook, PharmD Clinical Pharmacist 04/17/17 2:07 AM

## 2017-04-17 NOTE — Progress Notes (Signed)
CARDIAC REHAB PHASE I   Pt recently returned from cath lab, tentatively for CABG tomorrow, will hold ambulation at this time. Cardiac surgery pre-op education completed with pt and family at bedside. Reviewed IS, activity progression, sternal precautions, cardiac surgery booklet and cardiac surgery guidelines. Left instructions to view cardiac surgery videos. Pt verbalized understanding. Pt in bed, call bell within reach. Will follow.   [id number] Joylene GrapesEmily C Vail Basista, RN, BSN 04/17/2017 2:10 PM

## 2017-04-17 NOTE — Progress Notes (Signed)
Pre-op Cardiac Surgery  Carotid Findings:  Bilateral 1-39% ICA stenosis, antegrade vertebral flow.   Upper Extremity Right Left  Brachial Pressures 145, Tri 161, Tri  Radial Waveforms Tri Tri  Ulnar Waveforms Tri Tri  Palmar Arch (Allen's Test) waveform increased less than 50% with radial compression and decreased less than 50% with ulnar compression. waveform increased less than 50% with radial compression and decreased greater than 50% with ulnar compression.   Lower  Extremity Right Left  Dorsalis Pedis 152, Tri 163, Bi  Posterior Tibial 155, Tri 164, Bi  Ankle/Brachial Indices 0.96 1.02   Levin BaconClaire Sugar [name]- RDMS, RVT 4:37 PM  04/17/2017

## 2017-04-18 ENCOUNTER — Inpatient Hospital Stay (HOSPITAL_COMMUNITY): Payer: Medicare Other

## 2017-04-18 ENCOUNTER — Encounter (HOSPITAL_COMMUNITY)
Admission: EM | Disposition: A | Discharge status: Skilled Nursing Facility | Payer: Self-pay | Source: Home / Self Care | Attending: Cardiothoracic Surgery

## 2017-04-18 ENCOUNTER — Inpatient Hospital Stay (HOSPITAL_COMMUNITY): Payer: Medicare Other | Admitting: Certified Registered Nurse Anesthetist

## 2017-04-18 ENCOUNTER — Encounter (HOSPITAL_COMMUNITY): Payer: Self-pay

## 2017-04-18 DIAGNOSIS — Z951 Presence of aortocoronary bypass graft: Secondary | ICD-10-CM

## 2017-04-18 HISTORY — PX: CORONARY ARTERY BYPASS GRAFT: SHX141

## 2017-04-18 HISTORY — PX: TEE WITHOUT CARDIOVERSION: SHX5443

## 2017-04-18 LAB — POCT I-STAT, CHEM 8
BUN: 13 mg/dL (ref 6–20)
BUN: 12 mg/dL (ref 6–20)
BUN: 9 mg/dL (ref 6–20)
BUN: 11 mg/dL (ref 6–20)
BUN: 11 mg/dL (ref 6–20)
BUN: 11 mg/dL (ref 6–20)
BUN: 11 mg/dL (ref 6–20)
BUN: 10 mg/dL (ref 6–20)
CALCIUM ION: 1.23 mmol/L (ref 1.15–1.40)
CALCIUM ION: 0.88 mmol/L — AB (ref 1.15–1.40)
CALCIUM ION: 1.02 mmol/L — AB (ref 1.15–1.40)
CALCIUM ION: 1.17 mmol/L (ref 1.15–1.40)
CHLORIDE: 103 mmol/L (ref 101–111)
CHLORIDE: 104 mmol/L (ref 101–111)
CHLORIDE: 100 mmol/L — AB (ref 101–111)
CHLORIDE: 101 mmol/L (ref 101–111)
CREATININE: 0.9 mg/dL (ref 0.44–1.00)
Calcium, Ion: 1.22 mmol/L (ref 1.15–1.40)
Calcium, Ion: 1 mmol/L — ABNORMAL LOW (ref 1.15–1.40)
Calcium, Ion: 1 mmol/L — ABNORMAL LOW (ref 1.15–1.40)
Calcium, Ion: 1.12 mmol/L — ABNORMAL LOW (ref 1.15–1.40)
Chloride: 99 mmol/L — ABNORMAL LOW (ref 101–111)
Chloride: 98 mmol/L — ABNORMAL LOW (ref 101–111)
Chloride: 99 mmol/L — ABNORMAL LOW (ref 101–111)
Chloride: 103 mmol/L (ref 101–111)
Creatinine, Ser: 0.8 mg/dL (ref 0.44–1.00)
Creatinine, Ser: 0.7 mg/dL (ref 0.44–1.00)
Creatinine, Ser: 0.6 mg/dL (ref 0.44–1.00)
Creatinine, Ser: 0.7 mg/dL (ref 0.44–1.00)
Creatinine, Ser: 0.7 mg/dL (ref 0.44–1.00)
Creatinine, Ser: 0.7 mg/dL (ref 0.44–1.00)
Creatinine, Ser: 0.9 mg/dL (ref 0.44–1.00)
GLUCOSE: 216 mg/dL — AB (ref 65–99)
GLUCOSE: 213 mg/dL — AB (ref 65–99)
GLUCOSE: 227 mg/dL — AB (ref 65–99)
GLUCOSE: 176 mg/dL — AB (ref 65–99)
Glucose, Bld: 155 mg/dL — ABNORMAL HIGH (ref 65–99)
Glucose, Bld: 198 mg/dL — ABNORMAL HIGH (ref 65–99)
Glucose, Bld: 196 mg/dL — ABNORMAL HIGH (ref 65–99)
Glucose, Bld: 224 mg/dL — ABNORMAL HIGH (ref 65–99)
HCT: 31 % — ABNORMAL LOW (ref 36.0–46.0)
HCT: 22 % — ABNORMAL LOW (ref 36.0–46.0)
HCT: 26 % — ABNORMAL LOW (ref 36.0–46.0)
HCT: 25 % — ABNORMAL LOW (ref 36.0–46.0)
HCT: 27 % — ABNORMAL LOW (ref 36.0–46.0)
HEMATOCRIT: 21 % — AB (ref 36.0–46.0)
HEMATOCRIT: 22 % — AB (ref 36.0–46.0)
HEMATOCRIT: 26 % — AB (ref 36.0–46.0)
HEMOGLOBIN: 7.5 g/dL — AB (ref 12.0–15.0)
HEMOGLOBIN: 7.1 g/dL — AB (ref 12.0–15.0)
HEMOGLOBIN: 7.5 g/dL — AB (ref 12.0–15.0)
HEMOGLOBIN: 9.2 g/dL — AB (ref 12.0–15.0)
Hemoglobin: 10.5 g/dL — ABNORMAL LOW (ref 12.0–15.0)
Hemoglobin: 8.8 g/dL — ABNORMAL LOW (ref 12.0–15.0)
Hemoglobin: 8.5 g/dL — ABNORMAL LOW (ref 12.0–15.0)
Hemoglobin: 8.8 g/dL — ABNORMAL LOW (ref 12.0–15.0)
POTASSIUM: 4 mmol/L (ref 3.5–5.1)
POTASSIUM: 4.8 mmol/L (ref 3.5–5.1)
POTASSIUM: 4.2 mmol/L (ref 3.5–5.1)
Potassium: 3.7 mmol/L (ref 3.5–5.1)
Potassium: 3.7 mmol/L (ref 3.5–5.1)
Potassium: 4.2 mmol/L (ref 3.5–5.1)
Potassium: 5.1 mmol/L (ref 3.5–5.1)
Potassium: 3.9 mmol/L (ref 3.5–5.1)
SODIUM: 143 mmol/L (ref 135–145)
SODIUM: 136 mmol/L (ref 135–145)
SODIUM: 137 mmol/L (ref 135–145)
Sodium: 140 mmol/L (ref 135–145)
Sodium: 141 mmol/L (ref 135–145)
Sodium: 135 mmol/L (ref 135–145)
Sodium: 139 mmol/L (ref 135–145)
Sodium: 142 mmol/L (ref 135–145)
TCO2: 24 mmol/L (ref 22–32)
TCO2: 26 mmol/L (ref 22–32)
TCO2: 27 mmol/L (ref 22–32)
TCO2: 27 mmol/L (ref 22–32)
TCO2: 25 mmol/L (ref 22–32)
TCO2: 26 mmol/L (ref 22–32)
TCO2: 23 mmol/L (ref 22–32)
TCO2: 23 mmol/L (ref 22–32)

## 2017-04-18 LAB — BASIC METABOLIC PANEL
Anion gap: 7 (ref 5–15)
BUN: 13 mg/dL (ref 6–20)
CO2: 24 mmol/L (ref 22–32)
Calcium: 8.5 mg/dL — ABNORMAL LOW (ref 8.9–10.3)
Chloride: 106 mmol/L (ref 101–111)
Creatinine, Ser: 1.05 mg/dL — ABNORMAL HIGH (ref 0.44–1.00)
GFR calc Af Amer: >60 mL/min (ref >60)
GFR calc non Af Amer: 52 mL/min — ABNORMAL LOW (ref >60)
Glucose, Bld: 200 mg/dL — ABNORMAL HIGH (ref 65–99)
Potassium: 4 mmol/L (ref 3.5–5.1)
Sodium: 137 mmol/L (ref 135–145)

## 2017-04-18 LAB — POCT I-STAT 4, (NA,K, GLUC, HGB,HCT)
GLUCOSE: 203 mg/dL — AB (ref 65–99)
HEMATOCRIT: 25 % — AB (ref 36.0–46.0)
Hemoglobin: 8.5 g/dL — ABNORMAL LOW (ref 12.0–15.0)
POTASSIUM: 3.8 mmol/L (ref 3.5–5.1)
SODIUM: 142 mmol/L (ref 135–145)

## 2017-04-18 LAB — POCT I-STAT 3, ART BLOOD GAS (G3+)
ACID-BASE DEFICIT: 2 mmol/L (ref 0.0–2.0)
ACID-BASE EXCESS: 5 mmol/L — AB (ref 0.0–2.0)
Acid-Base Excess: 1 mmol/L (ref 0.0–2.0)
Acid-Base Excess: 4 mmol/L — ABNORMAL HIGH (ref 0.0–2.0)
BICARBONATE: 28.2 mmol/L — AB (ref 20.0–28.0)
Bicarbonate: 29.2 mmol/L — ABNORMAL HIGH (ref 20.0–28.0)
Bicarbonate: 25.9 mmol/L (ref 20.0–28.0)
Bicarbonate: 22.6 mmol/L (ref 20.0–28.0)
O2 SAT: 89 %
O2 Saturation: 100 %
O2 Saturation: 100 %
O2 Saturation: 93 %
PCO2 ART: 39.3 mmHg (ref 32.0–48.0)
PCO2 ART: 38.8 mmHg (ref 32.0–48.0)
PH ART: 7.431 (ref 7.350–7.450)
PO2 ART: 59 mmHg — AB (ref 83.0–108.0)
TCO2: 30 mmol/L (ref 22–32)
TCO2: 27 mmol/L (ref 22–32)
TCO2: 29 mmol/L (ref 22–32)
TCO2: 24 mmol/L (ref 22–32)
pCO2 arterial: 39.4 mmHg (ref 32.0–48.0)
pCO2 arterial: 38.9 mmHg (ref 32.0–48.0)
pH, Arterial: 7.478 — ABNORMAL HIGH (ref 7.350–7.450)
pH, Arterial: 7.458 — ABNORMAL HIGH (ref 7.350–7.450)
pH, Arterial: 7.372 (ref 7.350–7.450)
pO2, Arterial: 373 mmHg — ABNORMAL HIGH (ref 83.0–108.0)
pO2, Arterial: 227 mmHg — ABNORMAL HIGH (ref 83.0–108.0)
pO2, Arterial: 57 mmHg — ABNORMAL LOW (ref 83.0–108.0)

## 2017-04-18 LAB — CBC
HCT: 33.5 % — ABNORMAL LOW (ref 36.0–46.0)
HCT: 27 % — ABNORMAL LOW (ref 36.0–46.0)
HEMATOCRIT: 27.8 % — AB (ref 36.0–46.0)
HEMOGLOBIN: 11.1 g/dL — AB (ref 12.0–15.0)
HEMOGLOBIN: 9.6 g/dL — AB (ref 12.0–15.0)
Hemoglobin: 9.3 g/dL — ABNORMAL LOW (ref 12.0–15.0)
MCH: 26.7 pg (ref 26.0–34.0)
MCH: 27.6 pg (ref 26.0–34.0)
MCH: 27.3 pg (ref 26.0–34.0)
MCHC: 33.1 g/dL (ref 30.0–36.0)
MCHC: 34.5 g/dL (ref 30.0–36.0)
MCHC: 34.4 g/dL (ref 30.0–36.0)
MCV: 80.7 fL (ref 78.0–100.0)
MCV: 79.9 fL (ref 78.0–100.0)
MCV: 79.2 fL (ref 78.0–100.0)
Platelets: 211 10*3/uL (ref 150–400)
Platelets: 136 10*3/uL — ABNORMAL LOW (ref 150–400)
Platelets: 157 10*3/uL (ref 150–400)
RBC: 4.15 MIL/uL (ref 3.87–5.11)
RBC: 3.48 MIL/uL — AB (ref 3.87–5.11)
RBC: 3.41 MIL/uL — ABNORMAL LOW (ref 3.87–5.11)
RDW: 13.7 % (ref 11.5–15.5)
RDW: 14.3 % (ref 11.5–15.5)
RDW: 14.7 % (ref 11.5–15.5)
WBC: 7.3 10*3/uL (ref 4.0–10.5)
WBC: 9 10*3/uL (ref 4.0–10.5)
WBC: 11.2 10*3/uL — ABNORMAL HIGH (ref 4.0–10.5)

## 2017-04-18 LAB — CREATININE, SERUM
Creatinine, Ser: 1 mg/dL (ref 0.44–1.00)
GFR calc Af Amer: >60 mL/min (ref >60)
GFR calc non Af Amer: 55 mL/min — ABNORMAL LOW (ref >60)

## 2017-04-18 LAB — GLUCOSE, CAPILLARY
GLUCOSE-CAPILLARY: 154 mg/dL — AB (ref 65–99)
GLUCOSE-CAPILLARY: 153 mg/dL — AB (ref 65–99)
GLUCOSE-CAPILLARY: 147 mg/dL — AB (ref 65–99)
GLUCOSE-CAPILLARY: 178 mg/dL — AB (ref 65–99)
GLUCOSE-CAPILLARY: 161 mg/dL — AB (ref 65–99)
Glucose-Capillary: 227 mg/dL — ABNORMAL HIGH (ref 65–99)
Glucose-Capillary: 159 mg/dL — ABNORMAL HIGH (ref 65–99)
Glucose-Capillary: 162 mg/dL — ABNORMAL HIGH (ref 65–99)

## 2017-04-18 LAB — HEMOGLOBIN AND HEMATOCRIT, BLOOD
HCT: 23.2 % — ABNORMAL LOW (ref 36.0–46.0)
Hemoglobin: 8.1 g/dL — ABNORMAL LOW (ref 12.0–15.0)

## 2017-04-18 LAB — PLATELET COUNT: Platelets: 128 10*3/uL — ABNORMAL LOW (ref 150–400)

## 2017-04-18 LAB — PROTIME-INR
INR: 1.42
Prothrombin Time: 17.3 seconds — ABNORMAL HIGH (ref 11.4–15.2)

## 2017-04-18 LAB — HEMOGLOBIN A1C
Hgb A1c MFr Bld: 7.8 % — ABNORMAL HIGH (ref 4.8–5.6)
Mean Plasma Glucose: 177 mg/dL

## 2017-04-18 LAB — PREPARE RBC (CROSSMATCH)

## 2017-04-18 LAB — APTT: APTT: 25 s (ref 24–36)

## 2017-04-18 LAB — MAGNESIUM: Magnesium: 2.4 mg/dL (ref 1.7–2.4)

## 2017-04-18 LAB — HEPARIN LEVEL (UNFRACTIONATED): Heparin Unfractionated: 0.23 IU/mL — ABNORMAL LOW (ref 0.30–0.70)

## 2017-04-18 SURGERY — CORONARY ARTERY BYPASS GRAFTING (CABG)
Anesthesia: General | Site: Esophagus

## 2017-04-18 MED ORDER — MORPHINE SULFATE (PF) 4 MG/ML IV SOLN
2.0000 mg | INTRAVENOUS | Status: DC | PRN
  Administered 2017-04-19 (×2): 4 mg via INTRAVENOUS
  Filled 2017-04-18: qty 1

## 2017-04-18 MED ORDER — LEVALBUTEROL HCL 1.25 MG/0.5ML IN NEBU: 1.2500 mg | INHALATION_SOLUTION | Freq: Four times a day (QID) | RESPIRATORY_TRACT | Status: DC

## 2017-04-18 MED ORDER — ROCURONIUM BROMIDE 100 MG/10ML IV SOLN
INTRAVENOUS | Status: DC | PRN
  Administered 2017-04-18: 20 mg via INTRAVENOUS

## 2017-04-18 MED ORDER — PANTOPRAZOLE SODIUM 40 MG PO TBEC
40.0000 mg | DELAYED_RELEASE_TABLET | Freq: Every day | ORAL | Status: DC
  Administered 2017-04-20 – 2017-04-28 (×9): 40 mg via ORAL
  Filled 2017-04-18 (×9): qty 1

## 2017-04-18 MED ORDER — POTASSIUM CHLORIDE 10 MEQ/50ML IV SOLN
10.0000 meq | INTRAVENOUS | Status: AC
  Administered 2017-04-18 (×3): 10 meq via INTRAVENOUS

## 2017-04-18 MED ORDER — INSULIN REGULAR HUMAN 100 UNIT/ML IJ SOLN
INTRAMUSCULAR | Status: DC
  Administered 2017-04-18: 11.8 [IU]/h via INTRAVENOUS
  Administered 2017-04-19: 5.8 [IU]/h via INTRAVENOUS
  Filled 2017-04-18 (×3): qty 1

## 2017-04-18 MED ORDER — SODIUM CHLORIDE 0.9% FLUSH
3.0000 mL | Freq: Two times a day (BID) | INTRAVENOUS | Status: DC
  Administered 2017-04-19 – 2017-04-21 (×5): 3 mL via INTRAVENOUS
  Administered 2017-04-21: 10 mL via INTRAVENOUS
  Administered 2017-04-22: 3 mL via INTRAVENOUS
  Administered 2017-04-22: 10 mL via INTRAVENOUS
  Administered 2017-04-23 – 2017-04-28 (×11): 3 mL via INTRAVENOUS

## 2017-04-18 MED ORDER — BISACODYL 5 MG PO TBEC
10.0000 mg | DELAYED_RELEASE_TABLET | Freq: Every day | ORAL | Status: DC
  Administered 2017-04-20 – 2017-04-27 (×7): 10 mg via ORAL
  Filled 2017-04-18 (×7): qty 2

## 2017-04-18 MED ORDER — FENTANYL CITRATE (PF) 250 MCG/5ML IJ SOLN
INTRAMUSCULAR | Status: AC
  Filled 2017-04-18: qty 25

## 2017-04-18 MED ORDER — LACTATED RINGERS IV SOLN
INTRAVENOUS | Status: DC | PRN
  Administered 2017-04-18: 08:00:00 via INTRAVENOUS

## 2017-04-18 MED ORDER — ATORVASTATIN CALCIUM 80 MG PO TABS
80.0000 mg | ORAL_TABLET | Freq: Every day | ORAL | Status: DC
  Administered 2017-04-19 – 2017-04-27 (×9): 80 mg via ORAL
  Filled 2017-04-18 (×9): qty 1

## 2017-04-18 MED ORDER — ACETAMINOPHEN 500 MG PO TABS
1000.0000 mg | ORAL_TABLET | Freq: Four times a day (QID) | ORAL | Status: AC
  Administered 2017-04-19 – 2017-04-23 (×13): 1000 mg via ORAL
  Filled 2017-04-18 (×16): qty 2

## 2017-04-18 MED ORDER — LACTATED RINGERS IV SOLN
INTRAVENOUS | Status: DC | PRN
  Administered 2017-04-18: 07:00:00 via INTRAVENOUS

## 2017-04-18 MED ORDER — ACETAMINOPHEN 160 MG/5ML PO SOLN: 650.0000 mg | Freq: Once | ORAL | Status: AC

## 2017-04-18 MED ORDER — SODIUM CHLORIDE 0.9 % IV SOLN
0.0000 ug/kg/h | INTRAVENOUS | Status: DC
  Administered 2017-04-18: 0.7 ug/kg/h via INTRAVENOUS
  Administered 2017-04-19: 0.3 ug/kg/h via INTRAVENOUS
  Filled 2017-04-18: qty 2

## 2017-04-18 MED ORDER — FENTANYL CITRATE (PF) 250 MCG/5ML IJ SOLN
INTRAMUSCULAR | Status: AC
  Filled 2017-04-18: qty 5

## 2017-04-18 MED ORDER — INSULIN REGULAR BOLUS VIA INFUSION
0.0000 [IU] | Freq: Three times a day (TID) | INTRAVENOUS | Status: DC
  Filled 2017-04-18: qty 10

## 2017-04-18 MED ORDER — SODIUM CHLORIDE 0.9 % IV SOLN
20.0000 ug | Freq: Once | INTRAVENOUS | Status: AC
  Administered 2017-04-18: 20 ug via INTRAVENOUS
  Filled 2017-04-18: qty 5

## 2017-04-18 MED ORDER — DOCUSATE SODIUM 100 MG PO CAPS
200.0000 mg | ORAL_CAPSULE | Freq: Every day | ORAL | Status: DC
  Administered 2017-04-20 – 2017-04-27 (×8): 200 mg via ORAL
  Filled 2017-04-18 (×9): qty 2

## 2017-04-18 MED ORDER — DOPAMINE-DEXTROSE 3.2-5 MG/ML-% IV SOLN
0.0000 ug/kg/min | INTRAVENOUS | Status: DC
  Administered 2017-04-18: 3 ug/kg/min via INTRAVENOUS

## 2017-04-18 MED ORDER — LACTATED RINGERS IV SOLN
INTRAVENOUS | Status: DC
  Administered 2017-04-18: 16:00:00 via INTRAVENOUS

## 2017-04-18 MED ORDER — METOPROLOL TARTRATE 5 MG/5ML IV SOLN
2.5000 mg | INTRAVENOUS | Status: DC | PRN
  Administered 2017-04-19 – 2017-04-20 (×2): 2.5 mg via INTRAVENOUS
  Filled 2017-04-18 (×2): qty 5

## 2017-04-18 MED ORDER — MAGNESIUM SULFATE 4 GM/100ML IV SOLN
4.0000 g | Freq: Once | INTRAVENOUS | Status: AC
  Administered 2017-04-18: 4 g via INTRAVENOUS
  Filled 2017-04-18: qty 100

## 2017-04-18 MED ORDER — 0.9 % SODIUM CHLORIDE (POUR BTL) OPTIME
TOPICAL | Status: DC | PRN
  Administered 2017-04-18: 2000 mL

## 2017-04-18 MED ORDER — CHLORHEXIDINE GLUCONATE 0.12% ORAL RINSE (MEDLINE KIT)
15.0000 mL | Freq: Two times a day (BID) | OROMUCOSAL | Status: DC
  Administered 2017-04-18 – 2017-04-19 (×2): 15 mL via OROMUCOSAL

## 2017-04-18 MED ORDER — ROCURONIUM BROMIDE 100 MG/10ML IV SOLN: INTRAVENOUS | Status: DC | PRN

## 2017-04-18 MED ORDER — ASPIRIN 81 MG PO CHEW: 324.0000 mg | CHEWABLE_TABLET | Freq: Every day | ORAL | Status: DC

## 2017-04-18 MED ORDER — ALBUMIN HUMAN 5 % IV SOLN
250.0000 mL | INTRAVENOUS | Status: AC | PRN
  Administered 2017-04-18 (×2): 250 mL via INTRAVENOUS
  Filled 2017-04-18: qty 250

## 2017-04-18 MED ORDER — HEMOSTATIC AGENTS (NO CHARGE) OPTIME
TOPICAL | Status: DC | PRN
  Administered 2017-04-18: 2 via TOPICAL

## 2017-04-18 MED ORDER — METOPROLOL TARTRATE 25 MG/10 ML ORAL SUSPENSION
12.5000 mg | Freq: Two times a day (BID) | ORAL | Status: DC
  Administered 2017-04-18: 12.5 mg
  Filled 2017-04-18: qty 5

## 2017-04-18 MED ORDER — BISACODYL 10 MG RE SUPP
10.0000 mg | Freq: Every day | RECTAL | Status: DC
  Filled 2017-04-18 (×2): qty 1

## 2017-04-18 MED ORDER — DEXTROSE 5 % IV SOLN
1.5000 g | Freq: Two times a day (BID) | INTRAVENOUS | Status: DC
  Administered 2017-04-18 – 2017-04-19 (×3): 1.5 g via INTRAVENOUS
  Filled 2017-04-18 (×4): qty 1.5

## 2017-04-18 MED ORDER — SODIUM CHLORIDE 0.9 % IV SOLN: 250.0000 mL | INTRAVENOUS | Status: DC

## 2017-04-18 MED ORDER — OXYCODONE HCL 5 MG PO TABS
5.0000 mg | ORAL_TABLET | ORAL | Status: DC | PRN
  Administered 2017-04-19 – 2017-04-20 (×3): 5 mg via ORAL
  Administered 2017-04-20: 10 mg via ORAL
  Filled 2017-04-18: qty 1
  Filled 2017-04-18: qty 2
  Filled 2017-04-18 (×2): qty 1

## 2017-04-18 MED ORDER — MIDAZOLAM HCL 10 MG/2ML IJ SOLN
INTRAMUSCULAR | Status: AC
  Filled 2017-04-18: qty 2

## 2017-04-18 MED ORDER — MORPHINE SULFATE (PF) 4 MG/ML IV SOLN
1.0000 mg | INTRAVENOUS | Status: DC | PRN
  Administered 2017-04-18 – 2017-04-19 (×2): 2 mg via INTRAVENOUS
  Filled 2017-04-18 (×2): qty 1

## 2017-04-18 MED ORDER — NITROGLYCERIN IN D5W 200-5 MCG/ML-% IV SOLN
0.0000 ug/min | INTRAVENOUS | Status: DC
  Administered 2017-04-18: 5 ug/min via INTRAVENOUS

## 2017-04-18 MED ORDER — METOCLOPRAMIDE HCL 5 MG/ML IJ SOLN
10.0000 mg | Freq: Four times a day (QID) | INTRAMUSCULAR | Status: DC
  Administered 2017-04-18 – 2017-04-23 (×19): 10 mg via INTRAVENOUS
  Filled 2017-04-18 (×18): qty 2

## 2017-04-18 MED ORDER — EPHEDRINE SULFATE 50 MG/ML IJ SOLN
INTRAMUSCULAR | Status: DC | PRN
  Administered 2017-04-18: 5 mg via INTRAVENOUS
  Administered 2017-04-18: 10 mg via INTRAVENOUS

## 2017-04-18 MED ORDER — HEMOSTATIC AGENTS (NO CHARGE) OPTIME
TOPICAL | Status: DC | PRN
  Administered 2017-04-18: 1 via TOPICAL

## 2017-04-18 MED ORDER — SODIUM CHLORIDE 0.45 % IV SOLN: INTRAVENOUS | Status: DC | PRN

## 2017-04-18 MED ORDER — PROTAMINE SULFATE 10 MG/ML IV SOLN
INTRAVENOUS | Status: DC | PRN
  Administered 2017-04-18: 25 mg via INTRAVENOUS
  Administered 2017-04-18: 50 mg via INTRAVENOUS
  Administered 2017-04-18: 260 mg via INTRAVENOUS

## 2017-04-18 MED ORDER — ROCURONIUM BROMIDE 10 MG/ML (PF) SYRINGE
PREFILLED_SYRINGE | INTRAVENOUS | Status: DC | PRN
  Administered 2017-04-18: 50 mg via INTRAVENOUS
  Administered 2017-04-18: 40 mg via INTRAVENOUS
  Administered 2017-04-18: 60 mg via INTRAVENOUS

## 2017-04-18 MED ORDER — LEVALBUTEROL HCL 1.25 MG/0.5ML IN NEBU
1.2500 mg | INHALATION_SOLUTION | Freq: Four times a day (QID) | RESPIRATORY_TRACT | Status: DC
  Administered 2017-04-18 – 2017-04-23 (×16): 1.25 mg via RESPIRATORY_TRACT
  Filled 2017-04-18 (×18): qty 0.5

## 2017-04-18 MED ORDER — CALCIUM CHLORIDE 10 % IV SOLN
INTRAVENOUS | Status: DC | PRN
  Administered 2017-04-18 (×2): 100 mg via INTRAVENOUS

## 2017-04-18 MED ORDER — FENTANYL CITRATE (PF) 250 MCG/5ML IJ SOLN
INTRAMUSCULAR | Status: DC | PRN
Start: 2017-04-18 — End: 2017-04-18
  Administered 2017-04-18: 50 ug via INTRAVENOUS
  Administered 2017-04-18: 100 ug via INTRAVENOUS
  Administered 2017-04-18: 700 ug via INTRAVENOUS
  Administered 2017-04-18: 250 ug via INTRAVENOUS
  Administered 2017-04-18 (×2): 25 ug via INTRAVENOUS
  Administered 2017-04-18: 250 ug via INTRAVENOUS
  Administered 2017-04-18 (×2): 50 ug via INTRAVENOUS

## 2017-04-18 MED ORDER — HEMOSTATIC AGENTS (NO CHARGE) OPTIME
TOPICAL | Status: DC | PRN
  Administered 2017-04-18: 5 via TOPICAL

## 2017-04-18 MED ORDER — MIDAZOLAM HCL 2 MG/2ML IJ SOLN
2.0000 mg | INTRAMUSCULAR | Status: DC | PRN
  Administered 2017-04-19 (×2): 2 mg via INTRAVENOUS
  Filled 2017-04-18 (×2): qty 2

## 2017-04-18 MED ORDER — CHLORHEXIDINE GLUCONATE CLOTH 2 % EX PADS
6.0000 | MEDICATED_PAD | Freq: Every day | CUTANEOUS | Status: DC
  Administered 2017-04-18 – 2017-04-21 (×3): 6 via TOPICAL

## 2017-04-18 MED ORDER — ACETAMINOPHEN 160 MG/5ML PO SOLN
1000.0000 mg | Freq: Four times a day (QID) | ORAL | Status: AC
  Administered 2017-04-18 – 2017-04-19 (×2): 1000 mg
  Filled 2017-04-18 (×2): qty 40.6

## 2017-04-18 MED ORDER — PHENYLEPHRINE HCL 10 MG/ML IJ SOLN
INTRAMUSCULAR | Status: DC | PRN
  Administered 2017-04-18 (×2): 40 ug via INTRAVENOUS
  Administered 2017-04-18: 20 ug via INTRAVENOUS
  Administered 2017-04-18 (×2): 40 ug via INTRAVENOUS
  Administered 2017-04-18: 20 ug via INTRAVENOUS

## 2017-04-18 MED ORDER — SODIUM CHLORIDE 0.9 % IV SOLN
0.0000 ug/min | INTRAVENOUS | Status: DC
  Filled 2017-04-18: qty 2

## 2017-04-18 MED ORDER — CHLORHEXIDINE GLUCONATE 4 % EX LIQD
CUTANEOUS | Status: AC
  Filled 2017-04-18: qty 60

## 2017-04-18 MED ORDER — ORAL CARE MOUTH RINSE: 15.0000 mL | Freq: Two times a day (BID) | OROMUCOSAL | Status: DC

## 2017-04-18 MED ORDER — ORAL CARE MOUTH RINSE
15.0000 mL | Freq: Four times a day (QID) | OROMUCOSAL | Status: DC
  Administered 2017-04-18: 15 mL via OROMUCOSAL

## 2017-04-18 MED ORDER — SODIUM CHLORIDE 0.9% FLUSH
10.0000 mL | Freq: Two times a day (BID) | INTRAVENOUS | Status: DC
  Administered 2017-04-19 – 2017-04-22 (×5): 10 mL

## 2017-04-18 MED ORDER — TRAMADOL HCL 50 MG PO TABS
50.0000 mg | ORAL_TABLET | ORAL | Status: DC | PRN
  Administered 2017-04-23 – 2017-04-24 (×2): 50 mg via ORAL
  Administered 2017-04-25 – 2017-04-28 (×6): 100 mg via ORAL
  Filled 2017-04-18: qty 2
  Filled 2017-04-18: qty 1
  Filled 2017-04-18 (×5): qty 2
  Filled 2017-04-18: qty 1

## 2017-04-18 MED ORDER — MILRINONE LACTATE IN DEXTROSE 20-5 MG/100ML-% IV SOLN
0.2500 ug/kg/min | INTRAVENOUS | Status: DC
  Administered 2017-04-18 – 2017-04-21 (×5): 0.25 ug/kg/min via INTRAVENOUS
  Filled 2017-04-18 (×5): qty 100

## 2017-04-18 MED ORDER — FAMOTIDINE IN NACL 20-0.9 MG/50ML-% IV SOLN
20.0000 mg | Freq: Two times a day (BID) | INTRAVENOUS | Status: AC
  Administered 2017-04-18 – 2017-04-19 (×2): 20 mg via INTRAVENOUS
  Filled 2017-04-18 (×2): qty 50

## 2017-04-18 MED ORDER — SODIUM CHLORIDE 0.9% FLUSH: 10.0000 mL | INTRAVENOUS | Status: DC | PRN

## 2017-04-18 MED ORDER — SODIUM CHLORIDE 0.9 % IV SOLN
INTRAVENOUS | Status: DC
  Administered 2017-04-18: 16:00:00 via INTRAVENOUS

## 2017-04-18 MED ORDER — PROPOFOL 10 MG/ML IV BOLUS
INTRAVENOUS | Status: DC | PRN
  Administered 2017-04-18: 60 mg via INTRAVENOUS

## 2017-04-18 MED ORDER — SODIUM CHLORIDE 0.9% FLUSH: 3.0000 mL | INTRAVENOUS | Status: DC | PRN

## 2017-04-18 MED ORDER — LACTATED RINGERS IV SOLN: 500.0000 mL | Freq: Once | INTRAVENOUS | Status: DC | PRN

## 2017-04-18 MED ORDER — PNEUMOCOCCAL VAC POLYVALENT 25 MCG/0.5ML IJ INJ: 0.5000 mL | INJECTION | INTRAMUSCULAR | Status: DC | PRN

## 2017-04-18 MED ORDER — PROPOFOL 10 MG/ML IV BOLUS
INTRAVENOUS | Status: AC
  Filled 2017-04-18: qty 20

## 2017-04-18 MED ORDER — ASPIRIN EC 325 MG PO TBEC
325.0000 mg | DELAYED_RELEASE_TABLET | Freq: Every day | ORAL | Status: DC
  Administered 2017-04-20 – 2017-04-28 (×9): 325 mg via ORAL
  Filled 2017-04-18 (×9): qty 1

## 2017-04-18 MED ORDER — ACETAMINOPHEN 650 MG RE SUPP
650.0000 mg | Freq: Once | RECTAL | Status: AC
  Administered 2017-04-18: 650 mg via RECTAL

## 2017-04-18 MED ORDER — CHLORHEXIDINE GLUCONATE 0.12 % MT SOLN
15.0000 mL | OROMUCOSAL | Status: AC
  Administered 2017-04-18: 15 mL via OROMUCOSAL

## 2017-04-18 MED ORDER — METOPROLOL TARTRATE 12.5 MG HALF TABLET
12.5000 mg | ORAL_TABLET | Freq: Two times a day (BID) | ORAL | Status: DC
  Administered 2017-04-19 – 2017-04-20 (×2): 12.5 mg via ORAL
  Filled 2017-04-18 (×2): qty 1

## 2017-04-18 MED ORDER — VANCOMYCIN HCL IN DEXTROSE 1-5 GM/200ML-% IV SOLN
1000.0000 mg | Freq: Once | INTRAVENOUS | Status: AC
  Administered 2017-04-18: 1000 mg via INTRAVENOUS
  Filled 2017-04-18: qty 200

## 2017-04-18 MED ORDER — MIDAZOLAM HCL 5 MG/5ML IJ SOLN
INTRAMUSCULAR | Status: DC | PRN
  Administered 2017-04-18: 3 mg via INTRAVENOUS
  Administered 2017-04-18: 2 mg via INTRAVENOUS
  Administered 2017-04-18: 1 mg via INTRAVENOUS

## 2017-04-18 MED ORDER — ONDANSETRON HCL 4 MG/2ML IJ SOLN: 4.0000 mg | Freq: Four times a day (QID) | INTRAMUSCULAR | Status: DC | PRN

## 2017-04-18 MED ORDER — HEPARIN SODIUM (PORCINE) 1000 UNIT/ML IJ SOLN
INTRAMUSCULAR | Status: DC | PRN
  Administered 2017-04-18: 24000 [IU] via INTRAVENOUS
  Administered 2017-04-18: 2000 [IU] via INTRAVENOUS

## 2017-04-18 MED FILL — Magnesium Sulfate Inj 50%: INTRAMUSCULAR | Qty: 10 | Status: AC

## 2017-04-18 MED FILL — Heparin Sodium (Porcine) Inj 1000 Unit/ML: INTRAMUSCULAR | Qty: 30 | Status: AC

## 2017-04-18 MED FILL — Potassium Chloride Inj 2 mEq/ML: INTRAVENOUS | Qty: 40 | Status: AC

## 2017-04-18 SURGICAL SUPPLY — 109 items
ADAPTER CARDIO PERF ANTE/RETRO (ADAPTER) ×4 IMPLANT
ADPR PRFSN 84XANTGRD RTRGD (ADAPTER) ×2
AGENT HMST KT MTR STRL THRMB (HEMOSTASIS) ×2
BAG DECANTER FOR FLEXI CONT (MISCELLANEOUS) ×4 IMPLANT
BANDAGE ACE 4X5 VEL STRL LF (GAUZE/BANDAGES/DRESSINGS) ×4 IMPLANT
BANDAGE ACE 6X5 VEL STRL LF (GAUZE/BANDAGES/DRESSINGS) ×4 IMPLANT
BANDAGE ELASTIC 4 VELCRO ST LF (GAUZE/BANDAGES/DRESSINGS) ×2 IMPLANT
BANDAGE ELASTIC 6 VELCRO ST LF (GAUZE/BANDAGES/DRESSINGS) ×2 IMPLANT
BASKET HEART  (ORDER IN 25'S) (MISCELLANEOUS) ×1
BASKET HEART (ORDER IN 25'S) (MISCELLANEOUS) ×1
BASKET HEART (ORDER IN 25S) (MISCELLANEOUS) ×2 IMPLANT
BLADE CLIPPER SURG (BLADE) IMPLANT
BLADE STERNUM SYSTEM 6 (BLADE) ×4 IMPLANT
BLADE SURG 12 STRL SS (BLADE) ×6 IMPLANT
BNDG GAUZE ELAST 4 BULKY (GAUZE/BANDAGES/DRESSINGS) ×4 IMPLANT
CANISTER SUCT 3000ML PPV (MISCELLANEOUS) ×4 IMPLANT
CANNULA GUNDRY RCSP 15FR (MISCELLANEOUS) ×4 IMPLANT
CATH CPB KIT VANTRIGT (MISCELLANEOUS) ×4 IMPLANT
CATH ROBINSON RED A/P 18FR (CATHETERS) ×12 IMPLANT
CATH THORACIC 36FR RT ANG (CATHETERS) ×4 IMPLANT
CLIP FOGARTY SPRING 6M (CLIP) ×2 IMPLANT
CLIP RETRACTION 3.0MM CORONARY (MISCELLANEOUS) ×2 IMPLANT
CLIP VESOCCLUDE SM WIDE 24/CT (CLIP) ×10 IMPLANT
CRADLE DONUT ADULT HEAD (MISCELLANEOUS) ×4 IMPLANT
DRAIN CHANNEL 32F RND 10.7 FF (WOUND CARE) ×4 IMPLANT
DRAPE CARDIOVASCULAR INCISE (DRAPES) ×4
DRAPE SLUSH/WARMER DISC (DRAPES) ×4 IMPLANT
DRAPE SRG 135X102X78XABS (DRAPES) ×2 IMPLANT
DRSG AQUACEL AG ADV 3.5X14 (GAUZE/BANDAGES/DRESSINGS) ×4 IMPLANT
ELECT BLADE 4.0 EZ CLEAN MEGAD (MISCELLANEOUS) ×4
ELECT BLADE 6.5 EXT (BLADE) ×4 IMPLANT
ELECT CAUTERY BLADE 6.4 (BLADE) ×4 IMPLANT
ELECT REM PT RETURN 9FT ADLT (ELECTROSURGICAL) ×8
ELECTRODE BLDE 4.0 EZ CLN MEGD (MISCELLANEOUS) ×2 IMPLANT
ELECTRODE REM PT RTRN 9FT ADLT (ELECTROSURGICAL) ×4 IMPLANT
FELT TEFLON 1X6 (MISCELLANEOUS) ×8 IMPLANT
GAUZE SPONGE 4X4 12PLY STRL (GAUZE/BANDAGES/DRESSINGS) ×8 IMPLANT
GAUZE SPONGE 4X4 12PLY STRL LF (GAUZE/BANDAGES/DRESSINGS) ×4 IMPLANT
GLOVE BIO SURGEON STRL SZ7.5 (GLOVE) ×12 IMPLANT
GOWN STRL REUS W/ TWL LRG LVL3 (GOWN DISPOSABLE) ×8 IMPLANT
GOWN STRL REUS W/TWL LRG LVL3 (GOWN DISPOSABLE) ×16
HEMOSTAT POWDER SURGIFOAM 1G (HEMOSTASIS) ×12 IMPLANT
HEMOSTAT SURGICEL 2X14 (HEMOSTASIS) ×4 IMPLANT
INSERT FOGARTY XLG (MISCELLANEOUS) IMPLANT
KIT BASIN OR (CUSTOM PROCEDURE TRAY) ×4 IMPLANT
KIT ROOM TURNOVER OR (KITS) ×4 IMPLANT
KIT SUCTION CATH 14FR (SUCTIONS) ×4 IMPLANT
KIT VASOVIEW HEMOPRO VH 3000 (KITS) ×4 IMPLANT
LEAD PACING MYOCARDI (MISCELLANEOUS) ×6 IMPLANT
MARKER GRAFT CORONARY BYPASS (MISCELLANEOUS) ×12 IMPLANT
NS IRRIG 1000ML POUR BTL (IV SOLUTION) ×20 IMPLANT
PACK OPEN HEART (CUSTOM PROCEDURE TRAY) ×4 IMPLANT
PAD ARMBOARD 7.5X6 YLW CONV (MISCELLANEOUS) ×8 IMPLANT
PAD ELECT DEFIB RADIOL ZOLL (MISCELLANEOUS) ×4 IMPLANT
PENCIL BUTTON HOLSTER BLD 10FT (ELECTRODE) ×4 IMPLANT
POWDER SURGICEL 3.0 GRAM (HEMOSTASIS) ×2 IMPLANT
PUNCH AORTIC ROT 4.0MM RCL 40 (MISCELLANEOUS) ×2 IMPLANT
PUNCH AORTIC ROTATE 4.0MM (MISCELLANEOUS) IMPLANT
PUNCH AORTIC ROTATE 4.5MM 8IN (MISCELLANEOUS) IMPLANT
PUNCH AORTIC ROTATE 5MM 8IN (MISCELLANEOUS) IMPLANT
SET CARDIOPLEGIA MPS 5001102 (MISCELLANEOUS) ×2 IMPLANT
SOLUTION ANTI FOG 6CC (MISCELLANEOUS) ×2 IMPLANT
SPONGE LAP 4X18 X RAY DECT (DISPOSABLE) ×2 IMPLANT
SURGIFLO W/THROMBIN 8M KIT (HEMOSTASIS) ×6 IMPLANT
SUT BONE WAX W31G (SUTURE) ×4 IMPLANT
SUT ETHIBOND 2 0 SH (SUTURE) ×16
SUT ETHIBOND 2 0 SH 36X2 (SUTURE) IMPLANT
SUT MNCRL AB 4-0 PS2 18 (SUTURE) ×4 IMPLANT
SUT PROLENE 3 0 SH DA (SUTURE) ×10 IMPLANT
SUT PROLENE 3 0 SH1 36 (SUTURE) IMPLANT
SUT PROLENE 4 0 RB 1 (SUTURE) ×8
SUT PROLENE 4 0 SH DA (SUTURE) ×6 IMPLANT
SUT PROLENE 4-0 RB1 .5 CRCL 36 (SUTURE) ×2 IMPLANT
SUT PROLENE 5 0 C 1 36 (SUTURE) ×4 IMPLANT
SUT PROLENE 6 0 C 1 30 (SUTURE) ×14 IMPLANT
SUT PROLENE 6 0 CC (SUTURE) ×24 IMPLANT
SUT PROLENE 8 0 BV175 6 (SUTURE) ×12 IMPLANT
SUT PROLENE BLUE 7 0 (SUTURE) ×4 IMPLANT
SUT PROLENE POLY MONO (SUTURE) ×4 IMPLANT
SUT SILK  1 MH (SUTURE) ×6
SUT SILK 1 MH (SUTURE) IMPLANT
SUT SILK 1 TIES 10X30 (SUTURE) ×2 IMPLANT
SUT SILK 2 0 SH CR/8 (SUTURE) ×6 IMPLANT
SUT SILK 2 0 TIES 10X30 (SUTURE) ×2 IMPLANT
SUT SILK 2 0 TIES 17X18 (SUTURE) ×4
SUT SILK 2-0 18XBRD TIE BLK (SUTURE) IMPLANT
SUT SILK 3 0 SH CR/8 (SUTURE) ×4 IMPLANT
SUT SILK 4 0 TIE 10X30 (SUTURE) ×4 IMPLANT
SUT STEEL 6MS V (SUTURE) ×8 IMPLANT
SUT STEEL SZ 6 DBL 3X14 BALL (SUTURE) ×4 IMPLANT
SUT TEM PAC WIRE 2 0 SH (SUTURE) ×8 IMPLANT
SUT VIC AB 1 CTX 36 (SUTURE) ×8
SUT VIC AB 1 CTX36XBRD ANBCTR (SUTURE) ×4 IMPLANT
SUT VIC AB 2-0 CT1 27 (SUTURE) ×8
SUT VIC AB 2-0 CT1 TAPERPNT 27 (SUTURE) IMPLANT
SUT VIC AB 2-0 CTX 27 (SUTURE) ×4 IMPLANT
SUT VIC AB 3-0 X1 27 (SUTURE) ×4 IMPLANT
SUTURE E-PAK OPEN HEART (SUTURE) ×4 IMPLANT
SYSTEM SAHARA CHEST DRAIN ATS (WOUND CARE) ×4 IMPLANT
TAPE CLOTH SURG 4X10 WHT LF (GAUZE/BANDAGES/DRESSINGS) ×2 IMPLANT
TAPE PAPER 2X10 WHT MICROPORE (GAUZE/BANDAGES/DRESSINGS) ×2 IMPLANT
TOWEL GREEN STERILE (TOWEL DISPOSABLE) ×16 IMPLANT
TOWEL GREEN STERILE FF (TOWEL DISPOSABLE) ×8 IMPLANT
TOWEL OR 17X24 6PK STRL BLUE (TOWEL DISPOSABLE) ×8 IMPLANT
TOWEL OR 17X26 10 PK STRL BLUE (TOWEL DISPOSABLE) ×8 IMPLANT
TRAY FOLEY SILVER 16FR TEMP (SET/KITS/TRAYS/PACK) ×4 IMPLANT
TUBING INSUFFLATION (TUBING) ×4 IMPLANT
UNDERPAD 30X30 (UNDERPADS AND DIAPERS) ×4 IMPLANT
WATER STERILE IRR 1000ML POUR (IV SOLUTION) ×8 IMPLANT

## 2017-04-18 NOTE — Anesthesia Procedure Notes (Signed)
Arterial Line Insertion Start/End9/06/2017 7:10 AM, 04/18/2017 7:15 AM Performed by: Rosalio MacadamiaHAYES, CHRISTINE T, CRNA  Patient location: Pre-op. Preanesthetic checklist: patient identified, IV checked, site marked, risks and benefits discussed, surgical consent, monitors and equipment checked and pre-op evaluation Lidocaine 1% used for infiltration and patient sedated radial was placed Catheter size: 20 G Hand hygiene performed  and maximum sterile barriers used  Allen's test indicative of satisfactory collateral circulation Attempts: 2 Procedure performed without using ultrasound guided technique. Following insertion, Biopatch and dressing applied. Post procedure assessment: normal  Patient tolerated the procedure well with no immediate complications.

## 2017-04-18 NOTE — Progress Notes (Signed)
Patient ID: Andrea [name], female   DOB: October 10, 1945, 71 y.o.   MRN: [id number] EVENING ROUNDS NOTE :     [address]       [place], [id number]             [phone]                 Day of Surgery Procedure(s) (LRB): CORONARY ARTERY BYPASS GRAFTING (CABG) times four using left internal mammary artery and right leg saphenous vein (N/A) TRANSESOPHAGEAL ECHOCARDIOGRAM (TEE) (N/A)  Total Length of Stay:  LOS: 2 days  BP 101/61   Pulse 85   Temp 97.7 F (36.5 C)   Resp 12   Ht 5\' 1"  (1.549 m)   Wt 162 lb (73.5 kg)   SpO2 98%   BMI 30.61 kg/m   .Intake/Output      09/11 0701 - 09/12 0700   I.V. (mL/kg) 2468.7 (33.6)   Blood 1070   IV Piggyback 300   Total Intake(mL/kg) 3838.7 (52.2)   Urine (mL/kg/hr) 2585 (2.9)   Blood 1000   Chest Tube 130   Total Output 3715   Net +123.7         . sodium chloride    . [START ON 04/19/2017] sodium chloride    . sodium chloride 20 mL/hr at 04/18/17 1900  . albumin human    . cefUROXime (ZINACEF)  IV    . dexmedetomidine (PRECEDEX) IV infusion 0.3 mcg/kg/hr (04/18/17 1900)  . DOPamine 2.975 mcg/kg/min (04/18/17 1900)  . famotidine (PEPCID) IV Stopped (04/18/17 1627)  . insulin (NOVOLIN-R) infusion 7.1 Units/hr (04/18/17 1900)  . lactated ringers 20 mL/hr at 04/18/17 1900  . lactated ringers 20 mL/hr at 04/18/17 1900  . magnesium sulfate 4 g (04/18/17 1605)  . milrinone 0.25 mcg/kg/min (04/18/17 1900)  . nitroGLYCERIN 5 mcg/min (04/18/17 1900)  . phenylephrine (NEO-SYNEPHRINE) Adult infusion Stopped (04/18/17 1900)  . vancomycin       Lab Results  Component Value Date   WBC 9.0 04/18/2017   HGB 9.6 (L) 04/18/2017   HCT 27.8 (L) 04/18/2017   PLT 136 (L) 04/18/2017   GLUCOSE 203 (H) 04/18/2017   CHOL 117 04/17/2017   TRIG 207 (H) 04/17/2017   HDL 35 (L) 04/17/2017   LDLCALC 41 04/17/2017   ALT 21 04/17/2017   AST 25 04/17/2017   NA 142 04/18/2017   K 3.8 04/18/2017   CL 101 04/18/2017   CREATININE  0.70 04/18/2017   BUN 11 04/18/2017   CO2 24 04/18/2017   TSH 2.107 04/17/2017   INR 1.42 04/18/2017   HGBA1C 7.8 (H) 04/17/2017   Stable post op Still asleep fio2 up o 60 % On milrinone , and dopamine  Not bleeding   Delight OvensEdward B Lynze Reddy MD  Beeper [phone] Office [phone] 04/18/2017 7:20 PM

## 2017-04-18 NOTE — Progress Notes (Signed)
Respiratory called to notify readiness to start weaning.

## 2017-04-18 NOTE — Progress Notes (Signed)
RT note- fio2 increased post ABG. 

## 2017-04-18 NOTE — Progress Notes (Signed)
Rapid Wean Protocol initiated. Pt placed on 40/4 by respiratory therapist.

## 2017-04-18 NOTE — Progress Notes (Signed)
Pt placed on SIMV fiO2 40% after failed wean trial. Could not meet mechanic or ABG parameters (low pO2/sO2%). Pt currently tachypneic (27-35 rpm) at rest, c/o pain. Will address pain and reassess readiness to wean in half an hour.

## 2017-04-18 NOTE — Progress Notes (Signed)
Pre Procedure note for inpatients:   Andrea [name] has been scheduled for Procedure(s): CORONARY ARTERY BYPASS GRAFTING (CABG) (N/A) TRANSESOPHAGEAL ECHOCARDIOGRAM (TEE) (N/A) today. The various methods of treatment have been discussed with the patient. After consideration of the risks, benefits and treatment options the patient has consented to the planned procedure.   The patient has been seen and labs reviewed. There are no changes in the patient's condition to prevent proceeding with the planned procedure today.  Recent labs:  Lab Results  Component Value Date   WBC 7.3 04/18/2017   HGB 11.1 (L) 04/18/2017   HCT 33.5 (L) 04/18/2017   PLT 211 04/18/2017   GLUCOSE 200 (H) 04/18/2017   CHOL 117 04/17/2017   TRIG 207 (H) 04/17/2017   HDL 35 (L) 04/17/2017   LDLCALC 41 04/17/2017   ALT 21 04/17/2017   AST 25 04/17/2017   NA 137 04/18/2017   K 4.0 04/18/2017   CL 106 04/18/2017   CREATININE 1.05 (H) 04/18/2017   BUN 13 04/18/2017   CO2 24 04/18/2017   TSH 2.107 04/17/2017   INR 1.10 04/17/2017   HGBA1C 7.8 (H) 04/17/2017    Mikey BussingPeter Van Trigt III, MD 04/18/2017 8:19 AM

## 2017-04-18 NOTE — Progress Notes (Signed)
Pt placed on CPAP/PS by RT per RWP.

## 2017-04-18 NOTE — Brief Op Note (Signed)
04/16/2017 - 04/18/2017  12:33 PM  PATIENT:  Andrea [name]  71 y.o. female  PRE-OPERATIVE DIAGNOSIS:  CAD  POST-OPERATIVE DIAGNOSIS:  CAD  PROCEDURE:  Procedure(s): CORONARY ARTERY BYPASS GRAFTING (CABG) (N/A) TRANSESOPHAGEAL ECHOCARDIOGRAM (TEE) (N/A)  SURGEON:  Surgeon(s) and Role:    Kerin Perna* Van Trigt, Peter, MD - Primary  PHYSICIAN ASSISTANT:  Jari Favreessa Toran Murch, PA-C   ANESTHESIA:   general  EBL:  Total I/O In: 1100 [I.V.:1100] Out: 1150 [Urine:1150]  BLOOD ADMINISTERED:none  DRAINS: routine   LOCAL MEDICATIONS USED:  NONE  SPECIMEN:  No Specimen  DISPOSITION OF SPECIMEN:  N/A  COUNTS:  YES  TOURNIQUET:  * No tourniquets in log *  DICTATION: .Dragon Dictation  PLAN OF CARE: Admit to inpatient   PATIENT DISPOSITION:  ICU - intubated and hemodynamically stable.   Delay start of Pharmacological VTE agent (>24hrs) due to surgical blood loss or risk of bleeding: yes

## 2017-04-18 NOTE — Progress Notes (Signed)
  Echocardiogram Echocardiogram Transesophageal has been performed.  Arvil ChacoFoster, Rustyn Conery 04/18/2017, 10:49 AM

## 2017-04-18 NOTE — Transfer of Care (Signed)
Immediate Anesthesia Transfer of Care Note  Patient: Andrea [name]  Procedure(s) Performed: Procedure(s): CORONARY ARTERY BYPASS GRAFTING (CABG) times four using left internal mammary artery and right leg saphenous vein (N/A) TRANSESOPHAGEAL ECHOCARDIOGRAM (TEE) (N/A)  Patient Location: ICU  Anesthesia Type:General  Level of Consciousness: sedated  Airway & Oxygen Therapy: remains on ventilator   Post-op Assessment: Report given to RN and Post -op Vital signs reviewed and stable  Post vital signs: Reviewed and stable  Last Vitals:  Vitals:   04/18/17 0600 04/18/17 1528  BP: (!) 133/55   Pulse: 62   Resp: (!) 28   Temp:    SpO2: 90% (P) 98%    Last Pain:  Vitals:   04/18/17 0000  TempSrc:   PainSc: 0-No pain         Complications:none

## 2017-04-18 NOTE — Op Note (Signed)
Andrea [name], Andrea [name]               ACCOUNT NO.:  [id number]  MEDICAL RECORD NO.:  [id number]  LOCATION:  2H22C                        FACILITY:  [facility]  PHYSICIAN:  Kerin Perna, M.D.  DATE OF BIRTH:  Apr 22, 1946  DATE OF PROCEDURE:  04/18/2017 DATE OF DISCHARGE:                              OPERATIVE REPORT   OPERATIONS: 1. Coronary artery bypass grafting x4 (left internal mammary artery to     LAD, saphenous vein graft to diagonal, saphenous vein graft to OM-     1, saphenous vein graft to right coronary artery). 2. Endoscopic harvest of right leg greater saphenous vein. 3. Exposure of left leg greater saphenous vein, but not harvested.  SURGEON:  Kerin Perna, M.D.  ASSISTANT:  Jari Favre, PA-C.  ANESTHESIA:  General by Dr. Autumn Patty.  PREOPERATIVE DIAGNOSES:  Unstable angina, non-ST-elevation myocardial infarction, severe 3-vessel coronary artery disease with mild left ventricular dysfunction.  POSTOPERATIVE DIAGNOSES:  Unstable angina, non-ST-elevation myocardial infarction, severe 3-vessel coronary artery disease with mild left ventricular dysfunction.  CLINICAL NOTE:  The patient is a 71 year old hypertensive, diabetic, who was admitted through the emergency department with symptoms of unstable angina, was found to have elevated troponin, consistent with non-ST- elevation MI.  Echocardiogram showed LVH with mild LV dysfunction.  No significant valvular disease.  She underwent cardiac catheterization by Dr. Bryan Lemma, was found to have severe coronary artery disease including 95% stenosis of the RCA, 90-95% stenosis of the LAD diagonal, proximal bifurcation, chronic occlusion of a small distal circumflex marginal, at least 80% left main stenosis with mild elevation of LVEDP. Surgical revascularization was recommended.  The patient was placed on IV heparin and Aggrastat because of her symptoms of resting angina. Thoracic surgical evaluation was  requested, and I agreed with recommendation for urgent CABG.  I discussed the procedure in detail with the patient and her family including the indications, benefits, alternatives, and risks.  She understood the risks to include the risk of stroke, bleeding, blood transfusion requirement, MI, postoperative pulmonary problems including pleural effusion, postoperative infection, and death.  After reviewing these issues, she demonstrated her understanding and agreed to proceed with surgery under what I felt was an informed consent.  OPERATIVE FINDINGS: 1. Small but adequate conduit obtained from the right leg.  The left     leg was exposed, but the vein was no better. 2. Small difficult heavily diseased coronary target vessels.  The LAD     was intramyocardial as well. 3. Intraoperative anemia and coagulopathy from preoperative Aggrastat     requiring blood cell transfusion and platelet transfusion.  DESCRIPTION OF PROCEDURE:  The patient was brought from the preop holding area to the operating room and placed supine on the operating table.  General anesthesia was induced under invasive hemodynamic monitoring.  A transesophageal echo probe was placed by the Anesthesia team.  The patient was prepped and draped as a sterile field.  A proper time-out was performed.  A sternal incision was made as the saphenous vein was harvested endoscopically from the right leg.  The left internal mammary artery was harvested as a pedicle graft.  It was a small, 1-mm vessel, but had excellent  flow.  The sternal retractor was placed, and the pericardium opened and suspended.  Pursestrings were placed in the ascending aorta and right atrium.  After the vein was harvested, heparin was administered and the ACT was documented as being therapeutic.  The patient was then cannulated and placed on cardiopulmonary bypass.  The coronaries were identified for grafting.  These were difficult vessels. The distal RCA  posterior descending was too small to graft.  The graft was placed on the proximal RV branch off the main RCA.  The LAD was intramyocardial and a poor target distally, so was exposed at the mid septum after dissecting through the myocardium.  The distal circumflex, which was chronically occluded, was too small to graft.  The mammary artery and vein grafts were prepared for the distal anastomosis, and cardioplegia cannulas were placed both antegrade and retrograde cold blood cardioplegia.  The patient was cooled to 32 degrees, and aortic crossclamp was applied.  One liter of cold blood cardioplegia was delivered in split doses between the antegrade aortic and retrograde coronary sinus catheters.  There was good cardioplegic arrest, and septal temperature dropped less than 12 degrees. Cardioplegia was delivered every 20 minutes with a cross-clamp was in place.  The distal coronary anastomoses were performed.  The first distal anastomosis was to the RCA.  There was a proximal 95% stenosis.  A reversed saphenous vein was sewn end-to-side with running 7-0 Prolene. There was good flow through the graft.  The second distal anastomosis was the OM-1 or ramus branch of the left circumflex.  This was a 1.2-mm vessel, proximal left main 80% stenosis. The reversed saphenous vein was sewn end-to-side with running 7-0 Prolene with good flow through the graft.  Cardioplegia was redosed.  The third distal anastomosis was to the large diagonal branch, which then bifurcated into 2 small branches.  There was an ostial 90% stenosis.  A reversed saphenous vein was sewn end-to-side with running 7- 0 Prolene with good flow through the graft.  Cardioplegia was redosed. This was a small-caliber vein.  The fourth distal anastomosis was to the LAD using left IMA pedicle graft, which was brought through an opening in the left lateral pericardium, was brought down onto the LAD and sewn end-to-side with running  8-0 Prolene.  There was excellent flow through the anastomosis after briefly releasing the pedicle bulldog on the mammary artery.  The bulldog was reapplied and the pedicle secured epicardium.  Cardioplegia was redosed.  With the cross-clamp still in place, 3 proximal vein anastomoses were performed on the ascending aorta using a 4.0-mm punch running 6-0 Prolene.  Prior to tying down, the final proximal anastomosis air was vented from the coronaries with a dose of retrograde warm blood cardioplegia.  The crossclamp was removed.  The heart resumed a spontaneous rhythm.  The vein grafts were de-aired and opened.  Each had good flow, and hemostasis was documented at the proximal and distal sites.  The patient was rewarmed and reperfused. Temporary pacing wires were applied.  The patient was started on low- dose milrinone.  The lungs were expanded, and ventilator was resumed.  It should be noted that through the entire operation the patient had more bleeding than normal, probably because of the preoperative Aggrastat - tirofiban - which was continued right up to the time of surgery by the Cardiology Service.  The patient was then weaned from cardiopulmonary bypass after the ventilator was resumed.  She came off bypass easily with stable blood pressure and good  cardiac output.  Echo showed normal LV systolic function.  There was minimal mitral regurgitation.  Protamine was administered without adverse reaction.  The cannulas were removed. There was still diffuse coagulopathy.  The platelet count was low.  The patient was on a platelet inhibitor preoperatively, so we gave a unit of platelets, which improved coagulation function.  The patient also was anemic with a hemoglobin of 7.2 intraoperatively and was given a packed cell transfusion as well during the operation.  The patient remained stable, and gradually and with great effort, adequate hemostasis was achieved.  The superior  pericardial fat was closed over the aorta.  Anterior mediastinal and left pleural chest tubes were placed and brought out through separate incisions.  The sternum was closed with wire.  The pectoralis fascia was closed with #1 Vicryl.  The subcutaneous and skin layers were closed in running Vicryl, and sterile dressings were applied.  Cross-clamp time was 64 minutes.     Kerin Perna, M.D.     PV/MEDQ  D:  04/18/2017  T:  04/18/2017  Job:  [id number]  cc:   Landry Corporal, MD Lyn Records, M.D. Margaretmary Bayley, M.D.

## 2017-04-18 NOTE — Anesthesia Postprocedure Evaluation (Signed)
Anesthesia Post Note  Patient: Andrea [name]  Procedure(s) Performed: Procedure(s) (LRB): CORONARY ARTERY BYPASS GRAFTING (CABG) times four using left internal mammary artery and right leg saphenous vein (N/A) TRANSESOPHAGEAL ECHOCARDIOGRAM (TEE) (N/A)     Patient location during evaluation: SICU Anesthesia Type: General Level of consciousness: sedated Pain management: pain level controlled Vital Signs Assessment: post-procedure vital signs reviewed and stable Respiratory status: patient remains intubated per anesthesia plan Cardiovascular status: stable Anesthetic complications: no    Last Vitals:  Vitals:   04/18/17 1545 04/18/17 1600  BP: 100/63 (!) 87/57  Pulse: 89 77  Resp: 14 12  Temp: (!) 35.8 C (!) 35.8 C  SpO2: 100% 97%    Last Pain:  Vitals:   04/18/17 0000  TempSrc:   PainSc: 0-No pain                 Kayti Poss,W. EDMOND

## 2017-04-18 NOTE — Progress Notes (Addendum)
Anticoagulation consult note - F/U  Pharmacy Consult for Tirofiban/Heparin Indication: Post-cath/bridge to CABG  No Known Allergies  Patient Measurements: Height: 5\' 1"  (154.9 cm) Weight: 162 lb (73.5 kg) IBW/kg (Calculated) : 47.8 HDW: 63.9 kg   Labs:  Recent Labs  04/16/17 0306 04/17/17 0014 04/17/17 1808 04/17/17 1833  WBC 9.1 7.6 7.5  --   HGB 11.5* 10.8* 10.8*  --   HCT 33.1* 32.5* 32.5*  --   PLT 237 218 217  --   APTT  --   --   --  31  CREATININE 1.12* 1.18*  --   --   ALBUMIN  --   --  3.1*  --   PROT  --   --  5.9*  --   AST  --   --  25  --   ALT  --   --  21  --   ALKPHOS  --   --  54  --   BILITOT  --   --  0.8  --   BILIDIR  --   --  0.2  --   IBILI  --   --  0.6  --    Estimated Creatinine Clearance: 40.1 mL/min (A) (by C-G formula based on SCr of 1.18 mg/dL (H)).   Microbiology: Recent Results (from the past 720 hour(s))  Surgical pcr screen     Status: None   Collection Time: 04/17/17  1:35 PM  Result Value Ref Range Status   MRSA, PCR NEGATIVE NEGATIVE Final   Staphylococcus aureus NEGATIVE NEGATIVE Final    Comment: (NOTE) The Xpert SA Assay (FDA approved for NASAL specimens in patients 922 years of age and older), is one component of a comprehensive surveillance program. It is not intended to diagnose infection nor to guide or monitor treatment.     Medical History: Past Medical History:  Diagnosis Date  . Diabetes mellitus   . Hypertension   . Stroke [facility](HCC)    no residual weakness noted   Assessment: 71yo female presents with NSTEMI. Cath today shows 3-vessel disease with diffuse involvement of LAD and total occlusion of left circ. Patient for CABG 9/11 and pharmacy consulted to dose tirofiban/heparin in the meantime.   Heparin level is subtherapeutic at 0.23 with no infusion issues per RN. CBC is stable and no bleeding noted.   Goal of Therapy:  Adequate antiplatelet therapy pending CABG Heparin goal (0.3-0.5) Monitor platelets    Plan:  Tirofiban IV 0.075 mcg/kg/min  Increase heparin gtt to 1000 units/hr  Heparin level in 8 hrs  Daily heparin level and CBC Monitor for s/s bleeding F/u surgery/post-op plan  York CeriseKatherine Cook, PharmD Clinical Pharmacist 04/18/17 1:20 AM

## 2017-04-18 NOTE — Anesthesia Procedure Notes (Signed)
Procedure Name: Intubation Date/Time: 04/18/2017 8:39 AM Performed by: Shirlyn Goltz Pre-anesthesia Checklist: Patient identified, Emergency Drugs available, Suction available and Patient being monitored Patient Re-evaluated:Patient Re-evaluated prior to induction Oxygen Delivery Method: Circle system utilized Preoxygenation: Pre-oxygenation with 100% oxygen Induction Type: IV induction Ventilation: Mask ventilation without difficulty and Oral airway inserted - appropriate to patient size Laryngoscope Size: Mac and 3 Grade View: Grade II Tube type: Oral Tube size: 7.5 mm Number of attempts: 1 Airway Equipment and Method: Stylet Placement Confirmation: ETT inserted through vocal cords under direct vision,  positive ETCO2 and breath sounds checked- equal and bilateral Secured at: 21 cm Dental Injury: Teeth and Oropharynx as per pre-operative assessment

## 2017-04-18 NOTE — Progress Notes (Signed)
Respiratory called again to notify readiness to wean, said they'd send someone shortly..Marland Kitchen

## 2017-04-18 NOTE — Anesthesia Procedure Notes (Signed)
Central Venous Catheter Insertion Performed by: Heather RobertsSINGER, Lisseth Brazeau, anesthesiologist Start/End9/06/2017 7:29 AM, 04/18/2017 7:39 AM Patient location: Pre-op. Preanesthetic checklist: patient identified, IV checked, site marked, risks and benefits discussed, surgical consent, monitors and equipment checked, pre-op evaluation, timeout performed and anesthesia consent Position: Trendelenburg Lidocaine 1% used for infiltration and patient sedated Hand hygiene performed , maximum sterile barriers used  and Seldinger technique used Catheter size: 8.5 Fr Total catheter length 8. PA cath was placed.Sheath introducer Swan type:thermodilution PA Cath depth:50 Procedure performed using ultrasound guided technique. Ultrasound Notes:anatomy identified, needle tip was noted to be adjacent to the nerve/plexus identified, no ultrasound evidence of intravascular and/or intraneural injection and image(s) printed for medical record Attempts: 1 Following insertion, line sutured and dressing applied. Post procedure assessment: free fluid flow, blood return through all ports and no air  Patient tolerated the procedure well with no immediate complications.

## 2017-04-19 ENCOUNTER — Encounter (HOSPITAL_COMMUNITY): Payer: Self-pay | Admitting: Cardiothoracic Surgery

## 2017-04-19 ENCOUNTER — Inpatient Hospital Stay (HOSPITAL_COMMUNITY): Payer: Medicare Other

## 2017-04-19 LAB — POCT I-STAT 3, ART BLOOD GAS (G3+)
ACID-BASE DEFICIT: 3 mmol/L — AB (ref 0.0–2.0)
ACID-BASE DEFICIT: 5 mmol/L — AB (ref 0.0–2.0)
Acid-base deficit: 4 mmol/L — ABNORMAL HIGH (ref 0.0–2.0)
Acid-base deficit: 3 mmol/L — ABNORMAL HIGH (ref 0.0–2.0)
Acid-base deficit: 3 mmol/L — ABNORMAL HIGH (ref 0.0–2.0)
Acid-base deficit: 4 mmol/L — ABNORMAL HIGH (ref 0.0–2.0)
Acid-base deficit: 5 mmol/L — ABNORMAL HIGH (ref 0.0–2.0)
Acid-base deficit: 1 mmol/L (ref 0.0–2.0)
BICARBONATE: 20.8 mmol/L (ref 20.0–28.0)
BICARBONATE: 20.5 mmol/L (ref 20.0–28.0)
Bicarbonate: 20.3 mmol/L (ref 20.0–28.0)
Bicarbonate: 21.5 mmol/L (ref 20.0–28.0)
Bicarbonate: 21.9 mmol/L (ref 20.0–28.0)
Bicarbonate: 22.2 mmol/L (ref 20.0–28.0)
Bicarbonate: 22.2 mmol/L (ref 20.0–28.0)
Bicarbonate: 22.8 mmol/L (ref 20.0–28.0)
O2 SAT: 90 %
O2 SAT: 91 %
O2 SAT: 95 %
O2 SAT: 96 %
O2 Saturation: 91 %
O2 Saturation: 86 %
O2 Saturation: 84 %
O2 Saturation: 84 %
PCO2 ART: 44.6 mmHg (ref 32.0–48.0)
PCO2 ART: 37.5 mmHg (ref 32.0–48.0)
PCO2 ART: 37.5 mmHg (ref 32.0–48.0)
PCO2 ART: 35.6 mmHg (ref 32.0–48.0)
PCO2 ART: 36.6 mmHg (ref 32.0–48.0)
PCO2 ART: 35.7 mmHg (ref 32.0–48.0)
PH ART: 7.29 — AB (ref 7.350–7.450)
PH ART: 7.355 (ref 7.350–7.450)
PH ART: 7.369 (ref 7.350–7.450)
PH ART: 7.38 (ref 7.350–7.450)
PH ART: 7.414 (ref 7.350–7.450)
PO2 ART: 50 mmHg — AB (ref 83.0–108.0)
PO2 ART: 50 mmHg — AB (ref 83.0–108.0)
PO2 ART: 52 mmHg — AB (ref 83.0–108.0)
PO2 ART: 64 mmHg — AB (ref 83.0–108.0)
PO2 ART: 84 mmHg (ref 83.0–108.0)
Patient temperature: 36.7
Patient temperature: 37.3
TCO2: 21 mmol/L — AB (ref 22–32)
TCO2: 22 mmol/L (ref 22–32)
TCO2: 22 mmol/L (ref 22–32)
TCO2: 23 mmol/L (ref 22–32)
TCO2: 23 mmol/L (ref 22–32)
TCO2: 23 mmol/L (ref 22–32)
TCO2: 23 mmol/L (ref 22–32)
TCO2: 24 mmol/L (ref 22–32)
pCO2 arterial: 36.6 mmHg (ref 32.0–48.0)
pCO2 arterial: 39.8 mmHg (ref 32.0–48.0)
pH, Arterial: 7.359 (ref 7.350–7.450)
pH, Arterial: 7.375 (ref 7.350–7.450)
pH, Arterial: 7.353 (ref 7.350–7.450)
pO2, Arterial: 64 mmHg — ABNORMAL LOW (ref 83.0–108.0)
pO2, Arterial: 62 mmHg — ABNORMAL LOW (ref 83.0–108.0)
pO2, Arterial: 77 mmHg — ABNORMAL LOW (ref 83.0–108.0)

## 2017-04-19 LAB — CBC
HCT: 28.2 % — ABNORMAL LOW (ref 36.0–46.0)
HEMATOCRIT: 26.8 % — AB (ref 36.0–46.0)
HEMOGLOBIN: 9 g/dL — AB (ref 12.0–15.0)
Hemoglobin: 9.6 g/dL — ABNORMAL LOW (ref 12.0–15.0)
MCH: 27 pg (ref 26.0–34.0)
MCH: 26.9 pg (ref 26.0–34.0)
MCHC: 34 g/dL (ref 30.0–36.0)
MCHC: 33.6 g/dL (ref 30.0–36.0)
MCV: 79.2 fL (ref 78.0–100.0)
MCV: 80 fL (ref 78.0–100.0)
PLATELETS: 164 10*3/uL (ref 150–400)
Platelets: 140 10*3/uL — ABNORMAL LOW (ref 150–400)
RBC: 3.56 MIL/uL — ABNORMAL LOW (ref 3.87–5.11)
RBC: 3.35 MIL/uL — AB (ref 3.87–5.11)
RDW: 14.9 % (ref 11.5–15.5)
RDW: 15.3 % (ref 11.5–15.5)
WBC: 14.3 10*3/uL — AB (ref 4.0–10.5)
WBC: 13.6 10*3/uL — AB (ref 4.0–10.5)

## 2017-04-19 LAB — GLUCOSE, CAPILLARY
GLUCOSE-CAPILLARY: 105 mg/dL — AB (ref 65–99)
GLUCOSE-CAPILLARY: 114 mg/dL — AB (ref 65–99)
GLUCOSE-CAPILLARY: 118 mg/dL — AB (ref 65–99)
GLUCOSE-CAPILLARY: 120 mg/dL — AB (ref 65–99)
GLUCOSE-CAPILLARY: 123 mg/dL — AB (ref 65–99)
GLUCOSE-CAPILLARY: 138 mg/dL — AB (ref 65–99)
GLUCOSE-CAPILLARY: 140 mg/dL — AB (ref 65–99)
GLUCOSE-CAPILLARY: 147 mg/dL — AB (ref 65–99)
GLUCOSE-CAPILLARY: 148 mg/dL — AB (ref 65–99)
GLUCOSE-CAPILLARY: 228 mg/dL — AB (ref 65–99)
Glucose-Capillary: 121 mg/dL — ABNORMAL HIGH (ref 65–99)
Glucose-Capillary: 116 mg/dL — ABNORMAL HIGH (ref 65–99)
Glucose-Capillary: 122 mg/dL — ABNORMAL HIGH (ref 65–99)
Glucose-Capillary: 99 mg/dL (ref 65–99)
Glucose-Capillary: 124 mg/dL — ABNORMAL HIGH (ref 65–99)
Glucose-Capillary: 170 mg/dL — ABNORMAL HIGH (ref 65–99)
Glucose-Capillary: 110 mg/dL — ABNORMAL HIGH (ref 65–99)
Glucose-Capillary: 109 mg/dL — ABNORMAL HIGH (ref 65–99)

## 2017-04-19 LAB — PREPARE FRESH FROZEN PLASMA
UNIT DIVISION: 0
Unit division: 0

## 2017-04-19 LAB — PREPARE PLATELET PHERESIS: UNIT DIVISION: 0

## 2017-04-19 LAB — BASIC METABOLIC PANEL
Anion gap: 9 (ref 5–15)
BUN: 11 mg/dL (ref 6–20)
CO2: 21 mmol/L — ABNORMAL LOW (ref 22–32)
Calcium: 7.9 mg/dL — ABNORMAL LOW (ref 8.9–10.3)
Chloride: 109 mmol/L (ref 101–111)
Creatinine, Ser: 1.17 mg/dL — ABNORMAL HIGH (ref 0.44–1.00)
GFR, EST AFRICAN AMERICAN: 53 mL/min — AB (ref >60)
GFR, EST NON AFRICAN AMERICAN: 46 mL/min — AB (ref >60)
GLUCOSE: 98 mg/dL (ref 65–99)
POTASSIUM: 3.4 mmol/L — AB (ref 3.5–5.1)
SODIUM: 139 mmol/L (ref 135–145)

## 2017-04-19 LAB — MAGNESIUM
MAGNESIUM: 2.1 mg/dL (ref 1.7–2.4)
Magnesium: 2.4 mg/dL (ref 1.7–2.4)

## 2017-04-19 LAB — POCT I-STAT, CHEM 8
BUN: 10 mg/dL (ref 6–20)
CHLORIDE: 107 mmol/L (ref 101–111)
Calcium, Ion: 1.19 mmol/L (ref 1.15–1.40)
Creatinine, Ser: 0.9 mg/dL (ref 0.44–1.00)
Glucose, Bld: 171 mg/dL — ABNORMAL HIGH (ref 65–99)
HCT: 25 % — ABNORMAL LOW (ref 36.0–46.0)
Hemoglobin: 8.5 g/dL — ABNORMAL LOW (ref 12.0–15.0)
POTASSIUM: 4.1 mmol/L (ref 3.5–5.1)
SODIUM: 138 mmol/L (ref 135–145)
TCO2: 22 mmol/L (ref 22–32)

## 2017-04-19 LAB — BPAM PLATELET PHERESIS
Blood Product Expiration Date: 201809132359
ISSUE DATE / TIME: 201809111219
Unit Type and Rh: 8400

## 2017-04-19 LAB — BPAM FFP
Blood Product Expiration Date: 201809112359
Blood Product Expiration Date: 201809142359
ISSUE DATE / TIME: 201809111218
ISSUE DATE / TIME: 201809111218
UNIT TYPE AND RH: 6200
Unit Type and Rh: 6200

## 2017-04-19 LAB — CREATININE, SERUM
Creatinine, Ser: 1.11 mg/dL — ABNORMAL HIGH (ref 0.44–1.00)
GFR, EST AFRICAN AMERICAN: 57 mL/min — AB (ref >60)
GFR, EST NON AFRICAN AMERICAN: 49 mL/min — AB (ref >60)

## 2017-04-19 MED ORDER — ORAL CARE MOUTH RINSE: 15.0000 mL | Freq: Two times a day (BID) | OROMUCOSAL | Status: DC

## 2017-04-19 MED ORDER — CHLORHEXIDINE GLUCONATE 0.12 % MT SOLN: 15.0000 mL | Freq: Two times a day (BID) | OROMUCOSAL | Status: DC

## 2017-04-19 MED ORDER — SODIUM CHLORIDE 0.9 % IV SOLN
0.2000 ug/kg/h | INTRAVENOUS | Status: DC
  Filled 2017-04-19: qty 2

## 2017-04-19 MED ORDER — ALBUMIN HUMAN 5 % IV SOLN: 250.0000 mL | INTRAVENOUS | Status: AC | PRN

## 2017-04-19 MED ORDER — INSULIN DETEMIR 100 UNIT/ML ~~LOC~~ SOLN: 12.0000 [IU] | Freq: Two times a day (BID) | SUBCUTANEOUS | Status: DC

## 2017-04-19 MED ORDER — SODIUM CHLORIDE 0.9 % IV SOLN
0.1000 ug/kg/h | INTRAVENOUS | Status: DC
  Filled 2017-04-19: qty 2

## 2017-04-19 MED ORDER — VANCOMYCIN HCL IN DEXTROSE 1-5 GM/200ML-% IV SOLN
1000.0000 mg | Freq: Once | INTRAVENOUS | Status: AC
  Administered 2017-04-19: 1000 mg via INTRAVENOUS
  Filled 2017-04-19: qty 200

## 2017-04-19 MED ORDER — FENTANYL CITRATE (PF) 100 MCG/2ML IJ SOLN
50.0000 ug | Freq: Once | INTRAMUSCULAR | Status: AC
  Administered 2017-04-19: 50 ug via INTRAVENOUS

## 2017-04-19 MED ORDER — POTASSIUM CHLORIDE 10 MEQ/50ML IV SOLN
10.0000 meq | INTRAVENOUS | Status: AC
  Administered 2017-04-19 (×3): 10 meq via INTRAVENOUS
  Filled 2017-04-19 (×3): qty 50

## 2017-04-19 MED ORDER — FENTANYL CITRATE (PF) 100 MCG/2ML IJ SOLN: 25.0000 ug | INTRAMUSCULAR | Status: DC | PRN

## 2017-04-19 MED ORDER — FUROSEMIDE 10 MG/ML IJ SOLN
8.0000 mg/h | INTRAVENOUS | Status: DC
  Administered 2017-04-19 – 2017-04-20 (×2): 8 mg/h via INTRAVENOUS
  Filled 2017-04-19 (×3): qty 25

## 2017-04-19 MED ORDER — FENTANYL CITRATE (PF) 100 MCG/2ML IJ SOLN
25.0000 ug | INTRAMUSCULAR | Status: DC | PRN
  Administered 2017-04-19: 25 ug via INTRAVENOUS
  Administered 2017-04-20: 12.5 ug via INTRAVENOUS
  Filled 2017-04-19 (×2): qty 2

## 2017-04-19 MED ORDER — INSULIN ASPART 100 UNIT/ML ~~LOC~~ SOLN
0.0000 [IU] | SUBCUTANEOUS | Status: DC
Start: 2017-04-19 — End: 2017-04-23
  Administered 2017-04-19: 8 [IU] via SUBCUTANEOUS
  Administered 2017-04-19: 2 [IU] via SUBCUTANEOUS
  Administered 2017-04-20 (×2): 8 [IU] via SUBCUTANEOUS
  Administered 2017-04-20: 12 [IU] via SUBCUTANEOUS
  Administered 2017-04-20: 4 [IU] via SUBCUTANEOUS
  Administered 2017-04-20: 12 [IU] via SUBCUTANEOUS
  Administered 2017-04-21: 2 [IU] via SUBCUTANEOUS
  Administered 2017-04-21: 8 [IU] via SUBCUTANEOUS
  Administered 2017-04-21: 4 [IU] via SUBCUTANEOUS
  Administered 2017-04-21: 8 [IU] via SUBCUTANEOUS
  Administered 2017-04-21: 2 [IU] via SUBCUTANEOUS
  Administered 2017-04-21: 8 [IU] via SUBCUTANEOUS
  Administered 2017-04-22: 2 [IU] via SUBCUTANEOUS
  Administered 2017-04-22: 4 [IU] via SUBCUTANEOUS
  Administered 2017-04-22 – 2017-04-23 (×2): 2 [IU] via SUBCUTANEOUS

## 2017-04-19 MED ORDER — HALOPERIDOL LACTATE 5 MG/ML IJ SOLN
1.0000 mg | Freq: Three times a day (TID) | INTRAMUSCULAR | Status: DC | PRN
  Administered 2017-04-19: 1 mg via INTRAVENOUS
  Filled 2017-04-19: qty 1

## 2017-04-19 MED ORDER — SODIUM BICARBONATE 8.4 % IV SOLN
50.0000 meq | Freq: Once | INTRAVENOUS | Status: AC
  Administered 2017-04-19: 50 meq via INTRAVENOUS

## 2017-04-19 MED ORDER — FENTANYL CITRATE (PF) 100 MCG/2ML IJ SOLN
INTRAMUSCULAR | Status: AC
  Administered 2017-04-19: 50 ug via INTRAVENOUS
  Filled 2017-04-19: qty 2

## 2017-04-19 MED ORDER — HALOPERIDOL LACTATE 5 MG/ML IJ SOLN: 2.0000 mg | Freq: Four times a day (QID) | INTRAMUSCULAR | Status: DC | PRN

## 2017-04-19 MED ORDER — FUROSEMIDE 10 MG/ML IJ SOLN
20.0000 mg | Freq: Two times a day (BID) | INTRAMUSCULAR | Status: DC
  Administered 2017-04-19 (×2): 20 mg via INTRAVENOUS
  Filled 2017-04-19 (×2): qty 2

## 2017-04-19 MED ORDER — INSULIN DETEMIR 100 UNIT/ML ~~LOC~~ SOLN
15.0000 [IU] | Freq: Two times a day (BID) | SUBCUTANEOUS | Status: DC
  Administered 2017-04-19 (×2): 15 [IU] via SUBCUTANEOUS
  Filled 2017-04-19 (×3): qty 0.15

## 2017-04-19 MED ORDER — ORAL CARE MOUTH RINSE
15.0000 mL | Freq: Two times a day (BID) | OROMUCOSAL | Status: DC
  Administered 2017-04-20 – 2017-04-27 (×10): 15 mL via OROMUCOSAL

## 2017-04-19 MED ORDER — CHLORHEXIDINE GLUCONATE 0.12 % MT SOLN
15.0000 mL | Freq: Two times a day (BID) | OROMUCOSAL | Status: DC
  Administered 2017-04-19 – 2017-04-28 (×16): 15 mL via OROMUCOSAL
  Filled 2017-04-19 (×15): qty 15

## 2017-04-19 MED FILL — Sodium Chloride IV Soln 0.9%: INTRAVENOUS | Qty: 2000 | Status: AC

## 2017-04-19 MED FILL — Lidocaine HCl IV Inj 20 MG/ML: INTRAVENOUS | Qty: 5 | Status: AC

## 2017-04-19 MED FILL — Sodium Bicarbonate IV Soln 8.4%: INTRAVENOUS | Qty: 50 | Status: AC

## 2017-04-19 MED FILL — Electrolyte-R (PH 7.4) Solution: INTRAVENOUS | Qty: 3000 | Status: AC

## 2017-04-19 MED FILL — Mannitol IV Soln 20%: INTRAVENOUS | Qty: 500 | Status: AC

## 2017-04-19 NOTE — Care Management Note (Signed)
Case Management Note Donn PieriniKristi Briana Newman RN, BSN Unit 4E-Case Manager-- 2H coverage [phone]  Patient Details  Name: Andrea [name] MRN: [id number] Date of Birth: 01-14-1946  Subjective/Objective:     Pt admitted s/p CABGx4 on 04/18/17               Action/Plan: PTA pt lived at home with spouse- CM to follow for d/c needs  Expected Discharge Date:                  Expected Discharge Plan:     In-House Referral:     Discharge planning Services  CM Consult  Post Acute Care Choice:    Choice offered to:     DME Arranged:    DME Agency:     HH Arranged:    HH Agency:     Status of Service:  In process, will continue to follow  If discussed at Long Length of Stay Meetings, dates discussed:    Discharge Disposition:   Additional Comments:  Darrold SpanWebster, Beverlyn Mcginness Hall, RN 04/19/2017, 11:15 AM

## 2017-04-19 NOTE — Progress Notes (Signed)
Rapid wean protocol started. Pt now on 40/4.

## 2017-04-19 NOTE — Progress Notes (Signed)
Pt flipped to CPAP/PS per RWP by respiratory.

## 2017-04-19 NOTE — Progress Notes (Signed)
RWP initiated for second time. Pt now on 40/4.

## 2017-04-19 NOTE — Progress Notes (Addendum)
Dr. Tyrone SageGerhardt paged to discuss pt's rapid wean mechanics. Awaiting callback.  MD called back at 0100. Said to flip back and try again in a couple of hours.  Will continue to monitor.

## 2017-04-19 NOTE — Progress Notes (Signed)
While attempting weaning trial, pt became acutely agitated and combative and also tried to pull the ET tube out. Pt required multiple PRN meds (  fentanyl and  versed and 0.7 precedex) and multiple staff to restrain patient to prevent pt from harming self/staff and from removing lines/tubes.   Pt had become increasingly paranoid and agitated after midnight, writing notes on clipboard claiming staff were trying to kill her and asking for a phone to call the police. Occasionally disoriented to place/situation/time. Unclear what led to this change in mental status, as pt does not have a recorded history of psych issues.   MD notified of this event, gave orders for the BIL soft wrist restraints. Pt placed back on full support on the ventilator for now with FiO2 50%. Pt's wean ABG a little acidotic, pH 7.29 pO2 64 bicarb 21.5. No further orders at this time.  Will continue to monitor closely.

## 2017-04-19 NOTE — Procedures (Signed)
Extubation Procedure Note  Patient Details:   Name: Andrea [name] DOB: 11/01/1945 MRN: [id number]   Airway Documentation:     Evaluation  O2 sats: stable throughout Complications: No apparent complications Patient did tolerate procedure well. Bilateral Breath Sounds: Rhonchi   Yes   Patient was extubated per the rapid wean protocol. RT was not able to follow the rapid wean protocol exactly due to the severity of the patient's anxiety. Dr Donata ClayVan Trigt was at bedside and gave a verbal order to go ahead and extubate patient. RN at bedside with RT during extubation. Cuff leak was heard. No stridor was noted. Patient placed on venturi mask per Dr request. Patient is currently on a 35% venturi mask and tolerating well at this time.  Darolyn Ruashley M Rozell Kettlewell 04/19/2017, 9:11 AM

## 2017-04-19 NOTE — Progress Notes (Signed)
[facility] BRIEF SICU PROGRESS NOTE  1 Day Post-Op  S/P Procedure(s) (LRB): CORONARY ARTERY BYPASS GRAFTING (CABG) times four using left internal mammary artery and right leg saphenous vein (N/A) TRANSESOPHAGEAL ECHOCARDIOGRAM (TEE) (N/A)   Extubated this morning, just recently placed on BiPAP for marginal oxygenation and started on lasix drip Breathing comfortably on BiPAP w/ O2 sats 94-96% NSR w/ stable hemodynamics on dopamine and milrinone UOP 40-50 mL/hr  Labs okay although PaO2 only 50 w/ O2 sats 84% prior to starting BiPAP  Plan: Continue to monitor resp status closely  Andrea Nailslarence H Raymund Manrique, MD 04/19/2017 5:44 PM

## 2017-04-19 NOTE — Progress Notes (Addendum)
1 Day Post-Op Procedure(s) (LRB): CORONARY ARTERY BYPASS GRAFTING (CABG) times four using left internal mammary artery and right leg saphenous vein (N/A) TRANSESOPHAGEAL ECHOCARDIOGRAM (TEE) (N/A) Subjective: delerium with veny wean prevented extubation CXR and ABG adequate nsr with stable hemodynamics  Objective: Vital signs in last 24 hours: Temp:  [96.1 F (35.6 C)-99.3 F (37.4 C)] 97.5 F (36.4 C) (09/12 0800) Pulse Rate:  [69-124] 81 (09/12 0800) Cardiac Rhythm: Normal sinus rhythm (09/12 0753) Resp:  [12-37] 12 (09/12 0800) BP: (78-133)/(50-77) 133/60 (09/12 0800) SpO2:  [93 %-100 %] 100 % (09/12 0800) Arterial Line BP: (90-167)/(39-87) 150/60 (09/12 0800) FiO2 (%):  [40 %-70 %] 50 % (09/12 0800) Weight:  [179 lb 0.2 oz (81.2 kg)-179 lb 10.8 oz (81.5 kg)] 179 lb 0.2 oz (81.2 kg) (09/12 0500)  Hemodynamic parameters for last 24 hours: PAP: (17-44)/(2-23) 28/13 CO:  [3.3 L/min-4.3 L/min] 4.2 L/min CI:  [1.9 L/min/m2-2.5 L/min/m2] 2.4 L/min/m2  Intake/Output from previous day: 09/11 0701 - 09/12 0700 In: 5410.8 [I.V.:3260.8; Blood:1070; NG/GT:270; IV Piggyback:750] Out: 4985 [Urine:3465; Emesis/NG output:200; Blood:1000; Chest Tube:320] Intake/Output this shift: Total I/O In: 211.9 [I.V.:111.9; IV Piggyback:100] Out: 100 [Urine:100]       Exam    General- anxious on vent   Lungs- clear without rales, wheezes   Cor- regular rate and rhythm, no murmur , gallop   Abdomen- soft, non-tender   Extremities - warm, non-tender, minimal edema   Neuro- oriented, appropriate, no focal weakness   Lab Results:  Recent Labs  04/18/17 2201 04/18/17 2212 04/19/17 0400  WBC 11.2*  --  14.3*  HGB 9.3* 9.2* 9.6*  HCT 27.0* 27.0* 28.2*  PLT 157  --  164   BMET:  Recent Labs  04/18/17 0613  04/18/17 2212 04/19/17 0400  NA 137  < > 142 139  K 4.0  < > 3.9 3.4*  CL 106  < > 103 109  CO2 24  --   --  21*  GLUCOSE 200*  < > 176* 98  BUN 13  < > 10 11  CREATININE  1.05*  < > 0.90 1.17*  CALCIUM 8.5*  --   --  7.9*  < > = values in this interval not displayed.  PT/INR:  Recent Labs  04/18/17 1530  LABPROT 17.3*  INR 1.42   ABG    Component Value Date/Time   PHART 7.290 (L) 04/19/2017 0416   HCO3 21.5 04/19/2017 0416   TCO2 23 04/19/2017 0416   ACIDBASEDEF 5.0 (H) 04/19/2017 0416   O2SAT 90.0 04/19/2017 0416   CBG (last 3)   Recent Labs  04/19/17 0510 04/19/17 0554 04/19/17 0704  GLUCAP 105* 122* 99    Assessment/Plan: S/P Procedure(s) (LRB): CORONARY ARTERY BYPASS GRAFTING (CABG) times four using left internal mammary artery and right leg saphenous vein (N/A) TRANSESOPHAGEAL ECHOCARDIOGRAM (TEE) (N/A)   Postop delerium, inability to tolerate vent wean Diuresis Diabetes control vent wean   LOS: 3 days    Andrea [name] 04/19/2017

## 2017-04-20 ENCOUNTER — Inpatient Hospital Stay (HOSPITAL_COMMUNITY): Payer: Medicare Other

## 2017-04-20 LAB — POCT I-STAT, CHEM 8
BUN: 13 mg/dL (ref 6–20)
BUN: 15 mg/dL (ref 6–20)
CALCIUM ION: 1.09 mmol/L — AB (ref 1.15–1.40)
CREATININE: 1.1 mg/dL — AB (ref 0.44–1.00)
Calcium, Ion: 1.09 mmol/L — ABNORMAL LOW (ref 1.15–1.40)
Chloride: 97 mmol/L — ABNORMAL LOW (ref 101–111)
Chloride: 96 mmol/L — ABNORMAL LOW (ref 101–111)
Creatinine, Ser: 1.1 mg/dL — ABNORMAL HIGH (ref 0.44–1.00)
Glucose, Bld: 245 mg/dL — ABNORMAL HIGH (ref 65–99)
Glucose, Bld: 223 mg/dL — ABNORMAL HIGH (ref 65–99)
HEMATOCRIT: 23 % — AB (ref 36.0–46.0)
HEMATOCRIT: 27 % — AB (ref 36.0–46.0)
HEMOGLOBIN: 7.8 g/dL — AB (ref 12.0–15.0)
HEMOGLOBIN: 9.2 g/dL — AB (ref 12.0–15.0)
POTASSIUM: 3.4 mmol/L — AB (ref 3.5–5.1)
Potassium: 3.4 mmol/L — ABNORMAL LOW (ref 3.5–5.1)
Sodium: 139 mmol/L (ref 135–145)
Sodium: 138 mmol/L (ref 135–145)
TCO2: 28 mmol/L (ref 22–32)
TCO2: 27 mmol/L (ref 22–32)

## 2017-04-20 LAB — CBC
HCT: 26.2 % — ABNORMAL LOW (ref 36.0–46.0)
Hemoglobin: 8.9 g/dL — ABNORMAL LOW (ref 12.0–15.0)
MCH: 27.4 pg (ref 26.0–34.0)
MCHC: 34 g/dL (ref 30.0–36.0)
MCV: 80.6 fL (ref 78.0–100.0)
Platelets: 144 10*3/uL — ABNORMAL LOW (ref 150–400)
RBC: 3.25 MIL/uL — ABNORMAL LOW (ref 3.87–5.11)
RDW: 15.5 % (ref 11.5–15.5)
WBC: 14.6 10*3/uL — ABNORMAL HIGH (ref 4.0–10.5)

## 2017-04-20 LAB — POCT I-STAT 3, ART BLOOD GAS (G3+)
Acid-Base Excess: 6 mmol/L — ABNORMAL HIGH (ref 0.0–2.0)
Bicarbonate: 28.6 mmol/L — ABNORMAL HIGH (ref 20.0–28.0)
O2 Saturation: 93 %
PCO2 ART: 35.6 mmHg (ref 32.0–48.0)
PH ART: 7.514 — AB (ref 7.350–7.450)
PO2 ART: 59 mmHg — AB (ref 83.0–108.0)
Patient temperature: 99
TCO2: 30 mmol/L (ref 22–32)

## 2017-04-20 LAB — BLOOD GAS, ARTERIAL
Acid-Base Excess: 2.8 mmol/L — ABNORMAL HIGH (ref 0.0–2.0)
Bicarbonate: 26.6 mmol/L (ref 20.0–28.0)
Expiratory PAP: 8
FIO2: 60
Inspiratory PAP: 10
Mode: POSITIVE
O2 Saturation: 96.7 %
Patient temperature: 98.6
pCO2 arterial: 39.1 mmHg (ref 32.0–48.0)
pH, Arterial: 7.447 (ref 7.350–7.450)
pO2, Arterial: 83.8 mmHg (ref 83.0–108.0)

## 2017-04-20 LAB — BASIC METABOLIC PANEL
Anion gap: 8 (ref 5–15)
BUN: 12 mg/dL (ref 6–20)
CO2: 28 mmol/L (ref 22–32)
Calcium: 7.9 mg/dL — ABNORMAL LOW (ref 8.9–10.3)
Chloride: 99 mmol/L — ABNORMAL LOW (ref 101–111)
Creatinine, Ser: 1.25 mg/dL — ABNORMAL HIGH (ref 0.44–1.00)
GFR calc Af Amer: 49 mL/min — ABNORMAL LOW (ref >60)
GFR calc non Af Amer: 42 mL/min — ABNORMAL LOW (ref >60)
Glucose, Bld: 234 mg/dL — ABNORMAL HIGH (ref 65–99)
Potassium: 3.3 mmol/L — ABNORMAL LOW (ref 3.5–5.1)
Sodium: 135 mmol/L (ref 135–145)

## 2017-04-20 LAB — GLUCOSE, CAPILLARY
GLUCOSE-CAPILLARY: 244 mg/dL — AB (ref 65–99)
GLUCOSE-CAPILLARY: 209 mg/dL — AB (ref 65–99)
Glucose-Capillary: 250 mg/dL — ABNORMAL HIGH (ref 65–99)
Glucose-Capillary: 256 mg/dL — ABNORMAL HIGH (ref 65–99)

## 2017-04-20 MED ORDER — POTASSIUM CHLORIDE 10 MEQ/50ML IV SOLN
10.0000 meq | Freq: Once | INTRAVENOUS | Status: AC
  Administered 2017-04-20: 10 meq via INTRAVENOUS
  Filled 2017-04-20: qty 50

## 2017-04-20 MED ORDER — POTASSIUM CHLORIDE 10 MEQ/50ML IV SOLN
10.0000 meq | INTRAVENOUS | Status: AC
  Administered 2017-04-20 (×3): 10 meq via INTRAVENOUS

## 2017-04-20 MED ORDER — DEXTROSE 5 % IV SOLN
1.0000 g | Freq: Three times a day (TID) | INTRAVENOUS | Status: DC
  Administered 2017-04-20 – 2017-04-21 (×5): 1 g via INTRAVENOUS
  Filled 2017-04-20 (×5): qty 1

## 2017-04-20 MED ORDER — MIDAZOLAM HCL 2 MG/2ML IJ SOLN: 1.0000 mg | Freq: Four times a day (QID) | INTRAMUSCULAR | Status: DC | PRN

## 2017-04-20 MED ORDER — INSULIN DETEMIR 100 UNIT/ML ~~LOC~~ SOLN
25.0000 [IU] | Freq: Two times a day (BID) | SUBCUTANEOUS | Status: DC
  Administered 2017-04-20 – 2017-04-22 (×5): 25 [IU] via SUBCUTANEOUS
  Filled 2017-04-20 (×5): qty 0.25

## 2017-04-20 MED ORDER — POTASSIUM CHLORIDE 10 MEQ/50ML IV SOLN
10.0000 meq | INTRAVENOUS | Status: AC
  Administered 2017-04-20 (×3): 10 meq via INTRAVENOUS
  Filled 2017-04-20 (×3): qty 50

## 2017-04-20 MED ORDER — POTASSIUM CHLORIDE 10 MEQ/50ML IV SOLN: 10.0000 meq | Freq: Once | INTRAVENOUS | Status: DC

## 2017-04-20 MED ORDER — CARVEDILOL 3.125 MG PO TABS
3.1250 mg | ORAL_TABLET | Freq: Two times a day (BID) | ORAL | Status: DC
  Administered 2017-04-20 – 2017-04-22 (×4): 3.125 mg via ORAL
  Filled 2017-04-20 (×4): qty 1

## 2017-04-20 MED ORDER — POTASSIUM CHLORIDE 10 MEQ/50ML IV SOLN
INTRAVENOUS | Status: AC
  Filled 2017-04-20: qty 150

## 2017-04-20 NOTE — Evaluation (Signed)
Physical Therapy Evaluation Patient Details Name: Andrea [name] MRN: [id number] DOB: 06/10/1946 Today's Date: 04/20/2017   History of Present Illness  71 y.o. female with a history of h/o T2DM, HTN, HLD and prior stroke admitted with CP, NSTEMI s/p cath and CABG x 4 on 9/11, extubated 9/12  Clinical Impression  Pt with flat affect and decreased cognition on arrival. Pt demonstrates decreased strength, cognition, balance, function, transfers and gait who will benefit from acute therapy to maximize mobility, function and independence to decrease burden of care and return pt to PLOF. Pt and daughter educated for all precautions and encouraged continued mobility with nursing and HEP.   HR 104 SpO2 93% on 15L NRB 121/63 sitting 114/65 after standing- pr reported dizziness and no nystagmus    Follow Up Recommendations Home health PT;Supervision/Assistance - 24 hour    Equipment Recommendations       Recommendations for Other Services OT consult     Precautions / Restrictions Precautions Precautions: Sternal;Fall      Mobility  Bed Mobility               General bed mobility comments: in chair on arrival  Transfers Overall transfer level: Needs assistance   Transfers: Sit to/from Stand Sit to Stand: Min assist         General transfer comment: assist to rise and maintain standing from recliner. cues and assist for trunk extension and hip extension, pt reverts to squatting flexed posture without cues maintained. Shifted weight in standing with RW but pt unable to step or march  Ambulation/Gait             General Gait Details: unable  Stairs            Wheelchair Mobility    Modified Rankin (Stroke Patients Only)       Balance Overall balance assessment: Needs assistance   Sitting balance-Leahy Scale: Good       Standing balance-Leahy Scale: Poor                               Pertinent Vitals/Pain Pain Assessment: No/denies  pain    Home Living Family/patient expects to be discharged to:: Private residence Living Arrangements: Spouse/significant other Available Help at Discharge: Family;Available 24 hours/day Type of Home: House Home Access: Stairs to enter Entrance Stairs-Rails: Right Entrance Stairs-Number of Steps: 6 Home Layout: One level Home Equipment: None      Prior Function Level of Independence: Independent               Hand Dominance        Extremity/Trunk Assessment   Upper Extremity Assessment Upper Extremity Assessment: Generalized weakness    Lower Extremity Assessment Lower Extremity Assessment: Generalized weakness    Cervical / Trunk Assessment Cervical / Trunk Assessment: Kyphotic  Communication   Communication: Other (comment) (pt with very limited communication and not answering questions)  Cognition Arousal/Alertness: Awake/alert Behavior During Therapy: Flat affect Overall Cognitive Status: Impaired/Different from baseline Area of Impairment: Memory;Attention;Orientation;Following commands;Safety/judgement                 Orientation Level: Disoriented to;Time;Situation Current Attention Level: Focused Memory: Decreased recall of precautions;Decreased short-term memory Following Commands: Follows one step commands with increased time;Follows one step commands inconsistently Safety/Judgement: Decreased awareness of deficits;Decreased awareness of safety     General Comments: pt with very flat affect, very limited answers to questions but would answer yes  appropriately with 3 choices.       General Comments      Exercises General Exercises - Lower Extremity Hip Flexion/Marching: AAROM;Both;Seated;10 reps   Assessment/Plan    PT Assessment Patient needs continued PT services  PT Problem List Decreased strength;Decreased mobility;Decreased activity tolerance;Decreased balance;Decreased knowledge of use of DME;Decreased knowledge of  precautions;Decreased cognition;Cardiopulmonary status limiting activity       PT Treatment Interventions Gait training;Therapeutic exercise;Patient/family education;DME instruction;Therapeutic activities;Cognitive remediation;Stair training;Balance training;Functional mobility training    PT Goals (Current goals can be found in the Care Plan section)  Acute Rehab PT Goals Patient Stated Goal: go shopping PT Goal Formulation: With family Time For Goal Achievement: 05/04/17 Potential to Achieve Goals: Fair    Frequency Min 3X/week   Barriers to discharge   family reports they can provide 24hr assist even if she requires extensive physical assist    Co-evaluation               AM-PAC PT "6 Clicks" Daily Activity  Outcome Measure Difficulty turning over in bed (including adjusting bedclothes, sheets and blankets)?: A Lot Difficulty moving from lying on back to sitting on the side of the bed? : Unable Difficulty sitting down on and standing up from a chair with arms (e.g., wheelchair, bedside commode, etc,.)?: Unable Help needed moving to and from a bed to chair (including a wheelchair)?: A Lot Help needed walking in hospital room?: A Lot Help needed climbing 3-5 steps with a railing? : Total 6 Click Score: 9    End of Session Equipment Utilized During Treatment: Gait belt Activity Tolerance: Patient tolerated treatment well Patient left: in chair;with call bell/phone within reach;with family/visitor present Nurse Communication: Mobility status;Precautions PT Visit Diagnosis: Other abnormalities of gait and mobility (R26.89);Difficulty in walking, not elsewhere classified (R26.2);Muscle weakness (generalized) (M62.81)    Time: [id number] PT Time Calculation (min) (ACUTE ONLY): 26 min   Charges:   PT Evaluation $PT Eval Moderate Complexity: 1 Mod PT Treatments $Therapeutic Activity: 8-22 mins   PT G Codes:        Delaney MeigsMaija Tabor Salathiel Ferrara, PT [phone]   Khizar Fiorella B  Kloee Ballew 04/20/2017, 2:39 PM

## 2017-04-20 NOTE — Plan of Care (Signed)
Problem: Activity: Goal: Ability to tolerate increased activity will improve Outcome: Progressing OOB to chair   

## 2017-04-20 NOTE — Progress Notes (Signed)
Pt woke up confused, pulling at equipment, taking oxygen mask off.  RN attempted to reorient patient.  Patient states that staff is trying to hurt her, poison her, kill her.  Pt also hitting and trying to bite staff.  3 RNs at bedside protecting patient from hurting herself. RN called daughter Cinnamon and updated and requested she talk to pt on telephone.  Pt calmed down with hearing from her daughter, though is still refusing to leave oxygen mask on or take any medication.  Will continue to monitor pt closely.

## 2017-04-20 NOTE — Progress Notes (Signed)
Patient ID: Andrea [name], female   DOB: 1945/09/23, 71 y.o.   MRN: [id number] SICU Evening Rounds  Hemodynamically stable on milrinone 0.25  Urine output 85/hr on lasix 8 mg/hr.   sats 92% on NRB 15L.  BMET    Component Value Date/Time   NA 138 04/20/2017 1651   K 3.4 (L) 04/20/2017 1651   CL 96 (L) 04/20/2017 1651   CO2 28 04/20/2017 0831   GLUCOSE 223 (H) 04/20/2017 1651   BUN 15 04/20/2017 1651   CREATININE 1.10 (H) 04/20/2017 1651   CALCIUM 7.9 (L) 04/20/2017 0831   GFRNONAA 42 (L) 04/20/2017 0831   GFRAA 49 (L) 04/20/2017 0831   CBC    Component Value Date/Time   WBC 14.6 (H) 04/20/2017 0419   RBC 3.25 (L) 04/20/2017 0419   HGB 9.2 (L) 04/20/2017 1651   HCT 27.0 (L) 04/20/2017 1651   PLT 144 (L) 04/20/2017 0419   MCV 80.6 04/20/2017 0419   MCH 27.4 04/20/2017 0419   MCHC 34.0 04/20/2017 0419   RDW 15.5 04/20/2017 0419   Replete K+

## 2017-04-20 NOTE — Progress Notes (Addendum)
2 Days Post-Op Procedure(s) (LRB): CORONARY ARTERY BYPASS GRAFTING (CABG) times four using left internal mammary artery and right leg saphenous vein (N/A) TRANSESOPHAGEAL ECHOCARDIOGRAM (TEE) (N/A) Subjective: Off BiPAP to face mask- acute postop respiratory failure and hypoxemia Diuresing well Very weak, sleepy nsr with stable hemodynamics Objective: Vital signs in last 24 hours: Temp:  [97.3 F (36.3 C)-100.2 F (37.9 C)] 100.2 F (37.9 C) (09/13 0700) Pulse Rate:  [70-122] 111 (09/13 0700) Cardiac Rhythm: Sinus tachycardia (09/13 0400) Resp:  [12-35] 32 (09/13 0700) BP: (95-144)/(55-86) 114/60 (09/13 0700) SpO2:  [83 %-100 %] 93 % (09/13 0700) Arterial Line BP: (93-179)/(47-86) 142/60 (09/13 0700) FiO2 (%):  [35 %-100 %] 60 % (09/13 0700) Weight:  [169 lb 5 oz (76.8 kg)] 169 lb 5 oz (76.8 kg) (09/13 0500)  Hemodynamic parameters for last 24 hours: PAP: (23-49)/(9-24) 37/20 CO:  [3.6 L/min-6.4 L/min] 4 L/min CI:  [2.1 L/min/m2-3.7 L/min/m2] 2.3 L/min/m2  Intake/Output from previous day: 09/12 0701 - 09/13 0700 In: 2122.4 [P.O.:330; I.V.:1442.4; IV Piggyback:350] Out: 1335 [Urine:1155; Chest Tube:180] Intake/Output this shift: No intake/output data recorded.       Exam    General- alert and comfortable   Lungs- clear without rales, wheezes   Cor- regular rate and rhythm, no murmur , gallop   Abdomen- soft, non-tender   Extremities - warm, non-tender, minimal edema   Neuro- oriented, appropriate, no focal weakness   Lab Results:  Recent Labs  04/19/17 1531 04/19/17 1532 04/20/17 0419  WBC 13.6*  --  14.6*  HGB 9.0* 8.5* 8.9*  HCT 26.8* 25.0* 26.2*  PLT 140*  --  144*   BMET:  Recent Labs  04/18/17 0613  04/19/17 0400 04/19/17 1531 04/19/17 1532  NA 137  < > 139  --  138  K 4.0  < > 3.4*  --  4.1  CL 106  < > 109  --  107  CO2 24  --  21*  --   --   GLUCOSE 200*  < > 98  --  171*  BUN 13  < > 11  --  10  CREATININE 1.05*  < > 1.17* 1.11* 0.90   CALCIUM 8.5*  --  7.9*  --   --   < > = values in this interval not displayed.  PT/INR:  Recent Labs  04/18/17 1530  LABPROT 17.3*  INR 1.42   ABG    Component Value Date/Time   PHART 7.447 04/20/2017 0426   HCO3 26.6 04/20/2017 0426   TCO2 24 04/19/2017 2221   ACIDBASEDEF 1.0 04/19/2017 2221   O2SAT 96.7 04/20/2017 0426   CBG (last 3)   Recent Labs  04/19/17 1950 04/19/17 2252 04/20/17 0359  GLUCAP 228* 250* 256*    Assessment/Plan: S/P Procedure(s) (LRB): CORONARY ARTERY BYPASS GRAFTING (CABG) times four using left internal mammary artery and right leg saphenous vein (N/A) TRANSESOPHAGEAL ECHOCARDIOGRAM (TEE) (N/A) Mobilize Diuresis Diabetes control DC swan, chest tbes, OOB to chair, PT   LOS: 4 days    Kathlee Nationseter Van Trigt III 04/20/2017

## 2017-04-20 NOTE — Progress Notes (Signed)
Pt taken off bipap by RN and placed on 100% NRB mask.  Pt is tolerating well.  O2 sats 96% and RR 20-24.  RT will continue to monitor.

## 2017-04-20 NOTE — Progress Notes (Signed)
Pt woke up confused, asking for daughter, Cinnamon. Also removing oxygen mask and trying to pull at lines.  RN reoriented patient and explained that daughter had gone home to eat breakfast, take a shower and change clothes. Pt willing to put oxygen mask back on as long as RN will stay with her.  RN staying at bedside with patient for comfort and to keep patient safe.  Will continue to monitor pt closely.

## 2017-04-21 ENCOUNTER — Inpatient Hospital Stay (HOSPITAL_COMMUNITY): Payer: Medicare Other

## 2017-04-21 LAB — GLUCOSE, CAPILLARY
GLUCOSE-CAPILLARY: 141 mg/dL — AB (ref 65–99)
GLUCOSE-CAPILLARY: 212 mg/dL — AB (ref 65–99)
GLUCOSE-CAPILLARY: 214 mg/dL — AB (ref 65–99)
GLUCOSE-CAPILLARY: 232 mg/dL — AB (ref 65–99)
GLUCOSE-CAPILLARY: 285 mg/dL — AB (ref 65–99)
Glucose-Capillary: 212 mg/dL — ABNORMAL HIGH (ref 65–99)
Glucose-Capillary: 190 mg/dL — ABNORMAL HIGH (ref 65–99)
Glucose-Capillary: 138 mg/dL — ABNORMAL HIGH (ref 65–99)

## 2017-04-21 LAB — BPAM RBC
Blood Product Expiration Date: 201809202359
Blood Product Expiration Date: 201809302359
Blood Product Expiration Date: 201810142359
Blood Product Expiration Date: 201810152359
ISSUE DATE / TIME: 201809110833
ISSUE DATE / TIME: 201809110833
ISSUE DATE / TIME: 201809111222
ISSUE DATE / TIME: 201809111222
Unit Type and Rh: 5100
Unit Type and Rh: 9500
Unit Type and Rh: 9500
Unit Type and Rh: 9500

## 2017-04-21 LAB — COOXEMETRY PANEL
Carboxyhemoglobin: 1.4 % (ref 0.5–1.5)
Carboxyhemoglobin: 1 % (ref 0.5–1.5)
Methemoglobin: 1 % (ref 0.0–1.5)
Methemoglobin: 1.2 % (ref 0.0–1.5)
O2 Saturation: 47.7 %
O2 Saturation: 45.3 %
Total hemoglobin: 9.6 g/dL — ABNORMAL LOW (ref 12.0–16.0)
Total hemoglobin: 9.7 g/dL — ABNORMAL LOW (ref 12.0–16.0)

## 2017-04-21 LAB — TYPE AND SCREEN
ABO/RH(D): O POS
Antibody Screen: POSITIVE
DAT, IgG: NEGATIVE
Donor AG Type: NEGATIVE
Donor AG Type: NEGATIVE
Donor AG Type: NEGATIVE
Donor AG Type: NEGATIVE
Unit division: 0
Unit division: 0
Unit division: 0
Unit division: 0

## 2017-04-21 LAB — BLOOD GAS, ARTERIAL
Acid-Base Excess: 8.1 mmol/L — ABNORMAL HIGH (ref 0.0–2.0)
Bicarbonate: 31.6 mmol/L — ABNORMAL HIGH (ref 20.0–28.0)
Delivery systems: POSITIVE
Expiratory PAP: 5
FIO2: 40
Inspiratory PAP: 10
O2 Saturation: 94.7 %
Patient temperature: 98.6
pCO2 arterial: 39.6 mmHg (ref 32.0–48.0)
pH, Arterial: 7.513 — ABNORMAL HIGH (ref 7.350–7.450)
pO2, Arterial: 68.7 mmHg — ABNORMAL LOW (ref 83.0–108.0)

## 2017-04-21 LAB — COMPREHENSIVE METABOLIC PANEL
ALT: 44 U/L (ref 14–54)
AST: 42 U/L — ABNORMAL HIGH (ref 15–41)
Albumin: 2.8 g/dL — ABNORMAL LOW (ref 3.5–5.0)
Alkaline Phosphatase: 57 U/L (ref 38–126)
Anion gap: 7 (ref 5–15)
BUN: 14 mg/dL (ref 6–20)
CO2: 30 mmol/L (ref 22–32)
Calcium: 8.2 mg/dL — ABNORMAL LOW (ref 8.9–10.3)
Chloride: 98 mmol/L — ABNORMAL LOW (ref 101–111)
Creatinine, Ser: 1.14 mg/dL — ABNORMAL HIGH (ref 0.44–1.00)
GFR calc Af Amer: 55 mL/min — ABNORMAL LOW (ref >60)
GFR calc non Af Amer: 47 mL/min — ABNORMAL LOW (ref >60)
Glucose, Bld: 180 mg/dL — ABNORMAL HIGH (ref 65–99)
Potassium: 2.7 mmol/L — CL (ref 3.5–5.1)
Sodium: 135 mmol/L (ref 135–145)
Total Bilirubin: 1.8 mg/dL — ABNORMAL HIGH (ref 0.3–1.2)
Total Protein: 6.1 g/dL — ABNORMAL LOW (ref 6.5–8.1)

## 2017-04-21 LAB — CBC
HCT: 26.6 % — ABNORMAL LOW (ref 36.0–46.0)
Hemoglobin: 9 g/dL — ABNORMAL LOW (ref 12.0–15.0)
MCH: 27.2 pg (ref 26.0–34.0)
MCHC: 33.8 g/dL (ref 30.0–36.0)
MCV: 80.4 fL (ref 78.0–100.0)
Platelets: 142 10*3/uL — ABNORMAL LOW (ref 150–400)
RBC: 3.31 MIL/uL — ABNORMAL LOW (ref 3.87–5.11)
RDW: 15.1 % (ref 11.5–15.5)
WBC: 12.7 10*3/uL — ABNORMAL HIGH (ref 4.0–10.5)

## 2017-04-21 LAB — POCT I-STAT, CHEM 8
BUN: 20 mg/dL (ref 6–20)
CALCIUM ION: 1.09 mmol/L — AB (ref 1.15–1.40)
CREATININE: 1.1 mg/dL — AB (ref 0.44–1.00)
Chloride: 94 mmol/L — ABNORMAL LOW (ref 101–111)
GLUCOSE: 202 mg/dL — AB (ref 65–99)
HCT: 28 % — ABNORMAL LOW (ref 36.0–46.0)
HEMOGLOBIN: 9.5 g/dL — AB (ref 12.0–15.0)
Potassium: 3.2 mmol/L — ABNORMAL LOW (ref 3.5–5.1)
Sodium: 139 mmol/L (ref 135–145)
TCO2: 29 mmol/L (ref 22–32)

## 2017-04-21 MED ORDER — POTASSIUM CHLORIDE 10 MEQ/50ML IV SOLN
10.0000 meq | INTRAVENOUS | Status: AC
  Administered 2017-04-21 (×3): 10 meq via INTRAVENOUS
  Filled 2017-04-21 (×3): qty 50

## 2017-04-21 MED ORDER — POTASSIUM CHLORIDE 10 MEQ/50ML IV SOLN
10.0000 meq | INTRAVENOUS | Status: AC
  Administered 2017-04-21 (×2): 10 meq via INTRAVENOUS
  Filled 2017-04-21 (×2): qty 50

## 2017-04-21 MED ORDER — MILRINONE LACTATE IN DEXTROSE 20-5 MG/100ML-% IV SOLN: 0.1250 ug/kg/min | INTRAVENOUS | Status: DC

## 2017-04-21 MED ORDER — POTASSIUM CHLORIDE 10 MEQ/50ML IV SOLN
10.0000 meq | INTRAVENOUS | Status: AC
  Administered 2017-04-21 (×3): 10 meq via INTRAVENOUS
  Filled 2017-04-21 (×2): qty 50

## 2017-04-21 MED ORDER — DEXTROSE 5 % IV SOLN
1.0000 g | Freq: Two times a day (BID) | INTRAVENOUS | Status: DC
  Administered 2017-04-21 – 2017-04-23 (×4): 1 g via INTRAVENOUS
  Filled 2017-04-21 (×4): qty 1

## 2017-04-21 MED ORDER — FUROSEMIDE 10 MG/ML IJ SOLN
20.0000 mg | Freq: Every day | INTRAMUSCULAR | Status: DC
  Administered 2017-04-21 – 2017-04-23 (×3): 20 mg via INTRAVENOUS
  Filled 2017-04-21 (×3): qty 2

## 2017-04-21 NOTE — Progress Notes (Signed)
CT surgery p.m. Rounds  Patient much more alert and engaged Patient motivated to follow postoperative recommendations Was able to ambulate of few steps in the room today P.m. labs are satisfactory Blood sugars under better control

## 2017-04-21 NOTE — Progress Notes (Signed)
During 2 points during the night the patient removed the bipap mask and became agitated and paranoid accusing the RRT  And Elink nurse of attempting to "make it difficult" for her to breathe.  Patient would desat into the low 80s without distress, eventually with reassurance patient would replace the mask on her own and fall back asleep.  Will continue to monitor.  Ivery Quale RN

## 2017-04-21 NOTE — Discharge Summary (Signed)
Physician Discharge Summary  Patient ID: Andrea [name] MRN: [id number] DOB/AGE: [birth year] 71 y.o.  Admit date: 04/16/2017 Discharge date: 04/28/2017  Admission Diagnoses: Patient Active Problem List   Diagnosis Date Noted  . Non-ST elevation (NSTEMI) myocardial infarction (HCC)   . Unstable angina (HCC) 04/16/2017  . Hyperlipidemia 03/14/2016  . Hypertensive heart disease 12/17/2015  . Diabetes mellitus (HCC) 01/04/2012  . Hypertension   . Stroke [facility])     Discharge Diagnoses:  Active Problems:   Unstable angina (HCC)   Non-ST elevation (NSTEMI) myocardial infarction (HCC)   Hx of CABG   Discharged Condition: good  HPI:  Very nice 71 year old AA diabetic reformed smoker admitted to the hospital 3 days ago with unstable angina and positive cardiac enzymes--non-STEMI. Her chest pain has been increasing in frequency and intensity over the past few weeks. She has been taking increasing amounts of nitroglycerin but was having side effects including dizziness. Since admission she has had one brief episode of angina. On admission her EKG showed nonspecific changes in her troponin was minimally elevated. Echocardiogram showed LVH with normal LV systolic function, no significant valvular disease. Coronary arteriogram performed today shows severe three-vessel coronary artery disease with 95% stenosis of the LAD-diagonal, 95% proximal RCA stenosis, 99% stenosis of the obtuse marginal. LVEDP is 19. Because the patient's diabetes and severe three-vessel CAD she was recommended for CABG. She has been receiving doses of IV Aggrastat while in the hospital.  The patient stopped smoking in 1998. At that time she had a mild left body CVA. Carotid duplex scan in May 2018 showed no significant carotid artery disease.  The patient has had previous cholecystectomy and rotator cuff surgery without anesthesia problems or bleeding problems. PFTs-spirometry are adequate for sternotomy.  Hospital Course:   On 04/18/2017 Andrea [name] underwent a coronary bypass grafting 4 with Dr. Donata Clay. She tolerated the procedure well and was transferred to the ICU. She was extubated in a timely manner. We initiated a diuretic regimen postop day 1. Her blood glucose level remained well-controlled. Postop day 2 she was off her BiPAP facemask. She felt very sleepy and weak. She remained in normal sinus rhythm with stable hemodynamics. She continued to diurese well. We discontinued her chest tubes and her Swan-Ganz catheter. We started to mobilize the patient. She was transferred to the step down unit for continued care. She began to ambulate in the halls with an assist device. We continued to wean her oxygen requirements as tolerated. Her dietary intake improved. We titrated her Levemir for better glucose control management. We continued to encourage hourly use of her incentive spirometer. Her epicardial pacing wires were removed on 9/19. We consulted PT/OT for recommendations for discharge. We placed an inpatient rehabilitation consultation for the patient. We continued her diuretic regimen for fluid overload. Today, she is deemed ready for discharge to a SNF.   Consults: None  Significant Diagnostic Studies:  CLINICAL DATA:  CABG.  Shortness of breath  EXAM: PORTABLE CHEST 1 VIEW  COMPARISON:  Yesterday  FINDINGS: A Swan-Ganz catheter has been removed. Thoracic drains have also been removed. Low volume chest with atelectasis, mildly increased at the left base. No visible pneumothorax. CABG changes. Stable cardiopericardial enlargement.  IMPRESSION: 1. No evidence of pneumothorax after thoracic drain removal. 2. Low volume chest with atelectasis, mildly increased on the left compared yesterday.   Electronically Signed   By: Marnee Spring M.D.   On: 04/21/2017 07:41  Treatments:   NAME:  Andrea [name]  ACCOUNT NO.:  [id number]  MEDICAL RECORD NO.:  [id number]  LOCATION:  2H22C                         FACILITY:  [facility]  PHYSICIAN:  Kerin Perna, M.D.  DATE OF BIRTH:  1946/02/28  DATE OF PROCEDURE:  04/18/2017 DATE OF DISCHARGE:                              OPERATIVE REPORT   OPERATIONS: 1. Coronary artery bypass grafting x4 (left internal mammary artery to     LAD, saphenous vein graft to diagonal, saphenous vein graft to OM-     1, saphenous vein graft to right coronary artery). 2. Endoscopic harvest of right leg greater saphenous vein. 3. Exposure of left leg greater saphenous vein, but not harvested.  SURGEON:  Kerin Perna, M.D.  ASSISTANT:  Jari Favre, PA-C.  ANESTHESIA:  General by Dr. Autumn Patty.  PREOPERATIVE DIAGNOSES:  Unstable angina, non-ST-elevation myocardial infarction, severe 3-vessel coronary artery disease with mild left ventricular dysfunction.  POSTOPERATIVE DIAGNOSES:  Unstable angina, non-ST-elevation myocardial infarction, severe 3-vessel coronary artery disease with mild left ventricular dysfunction.  Discharge Exam: Blood pressure 134/71, pulse 84, temperature 98.1 F (36.7 C), temperature source Oral, resp. rate (!) 22, height  (1.549 m), weight 73.8 kg (162 lb 12.8 oz), SpO2 93 %.   General appearance: alert, cooperative and no distress Heart: regular rate and rhythm, S1, S2 normal, no murmur, click, rub or gallop Lungs: clear to auscultation bilaterally Abdomen: soft, non-tender; bowel sounds normal; no masses,  no organomegaly Extremities: 1+ non-pitting pedal edema Wound: clean and dry  Disposition: 01-Home or Self Care  Discharge Instructions    Amb Referral to Cardiac Rehabilitation    Complete by:  As directed    Diagnosis:  CABG   CABG X ___:  4   Discharge patient    Complete by:  As directed    Discharge disposition:  03-Skilled Nursing Facility   Discharge patient date:  04/28/2017     Allergies as of 04/28/2017   No Known Allergies     Medication List    STOP taking  these medications   aspirin 81 MG tablet Replaced by:  aspirin 325 MG EC tablet   naproxen sodium 220 MG tablet Commonly known as:  ANAPROX   nitroGLYCERIN 0.4 MG SL tablet Commonly known as:  NITROSTAT   Olmesartan-Amlodipine-HCTZ 40-10-25 MG Tabs   vitamin E 400 UNIT capsule     TAKE these medications   aspirin 325 MG EC tablet Take 1 tablet (325 mg total) by mouth daily. Replaces:  aspirin 81 MG tablet   atorvastatin 80 MG tablet Commonly known as:  LIPITOR Take 1 tablet (80 mg total) by mouth daily at 6 PM.   carvedilol 6.25 MG tablet Commonly known as:  COREG Take 1 tablet (6.25 mg total) by mouth 2 (two) times daily with a meal.   furosemide 40 MG tablet Commonly known as:  LASIX Take 1 tablet (40 mg total) by mouth daily.   insulin detemir 100 UNIT/ML injection Commonly known as:  LEVEMIR Inject 20-100 Units into the skin See admin instructions. Sliding Scale   naphazoline-glycerin 0.012-0.2 % Soln Commonly known as:  CLEAR EYES Place 1-2 drops into both eyes 4 (four) times daily as needed for eye irritation (eye redness, dryness).   ONETOUCH VERIO test strip Generic drug:  glucose blood Use as directed four times daily to test blood sugar level.   potassium chloride SA 20 MEQ tablet Commonly known as:  K-DUR,KLOR-CON Take 1 tablet (20 mEq total) by mouth daily.   sitaGLIPtin-metformin 50-1000 MG tablet Commonly known as:  JANUMET Take 1 tablet by mouth 2 (two) times daily with a meal.   traMADol 50 MG tablet Commonly known as:  ULTRAM Take 2 tablets (100 mg total) by mouth every 6 (six) hours as needed for moderate pain.   VESICARE 10 MG tablet Generic drug:  solifenacin Take 10 mg by mouth daily.   Vitamin D 2000 units tablet Take 2,000 Units by mouth daily.            Discharge Care Instructions        Start     Ordered   04/29/17 0000  aspirin EC 325 MG EC tablet  Daily     04/28/17 1303   04/28/17 0000  Amb Referral to Cardiac  Rehabilitation    Question Answer Comment  Diagnosis: CABG   CABG X ___ 4      04/28/17 1057   04/28/17 0000  atorvastatin (LIPITOR) 80 MG tablet  Daily-1800    Question:  Supervising Provider  Answer:  Donata Clay, PETER   04/28/17 1303   04/28/17 0000  carvedilol (COREG) 6.25 MG tablet  2 times daily with meals     04/28/17 1303   04/28/17 0000  naphazoline-glycerin (CLEAR EYES) 0.012-0.2 % SOLN  4 times daily PRN    Question:  Supervising Provider  Answer:  Donata Clay, PETER   04/28/17 1303   04/28/17 0000  traMADol (ULTRAM) 50 MG tablet  Every 6 hours PRN    Question:  Supervising Provider  Answer:  Kerin Perna   04/28/17 1303   04/28/17 0000  Discharge patient    Question Answer Comment  Discharge disposition 03-Skilled Nursing Facility   Discharge patient date 04/28/2017      04/28/17 1303   04/28/17 0000  furosemide (LASIX) 40 MG tablet  Daily    Question:  Supervising Provider  Answer:  Donata Clay, PETER   04/28/17 1323   04/28/17 0000  potassium chloride (K-DUR,KLOR-CON) 20 MEQ tablet  Daily    Question:  Supervising Provider  Answer:  Donata Clay, Theron Arista   04/28/17 1323      Contact information for follow-up providers    Laurena Slimmer, MD. Call in 1 day(s).   Specialty:  Internal Medicine Contact information: [address] [place] [address] [phone]        Kerin Perna, MD Follow up.   Specialty:  Cardiothoracic Surgery Why:  Your follow-up appointment is on 05/17/2017 at 11:00 AM. Please arrive at 10:30 AM for a chest x-ray located at [facility] which is on first floor of our building. Contact information: [address] [place] [address] [phone]        Lyn Records, MD Follow up.   Specialty:  Cardiology Why:  A 2 week follow-up cardiology appointment is being arranged. If it is not listed in the discharge paperwork or  you have not heard from their office within 1 week, please call to  arrange. Contact information: [address] [place] [address] [phone]            Contact information for after-discharge care    Destination    [facility] .   Specialty:  Skilled  Nursing Facility Contact information: [address] [place] [address] [phone]                 The patient has been discharged on:   1.Beta Blocker:  Yes [  x ]                              No   [   ]                              If No, reason:  2.Ace Inhibitor/ARB: Yes [   ]                                     No  [  x  ]                                     If No, reason:  3.Statin:   Yes [  x ]                  No  [   ]                  If No, reason:  4.Ecasa:  Yes  [  x ]                  No   [   ]                  If No, reason:  1. Please obtain vital signs at least one time daily 2.Please weigh the patient daily. If he or she continues to gain weight or develops lower extremity edema, contact the office at [phone]. 3. Ambulate patient at least three times daily and please use sternal precautions. 4. Please check incisions daily and wash with soap and water 5. Maintain sternal precautions: no pushing or pulling with upper extremity and limit weight to 5lbs.   Signed: Sharlene Dory 04/28/2017, 1:23 PM

## 2017-04-21 NOTE — Progress Notes (Signed)
PHARMACY NOTE:  ANTIMICROBIAL RENAL DOSAGE ADJUSTMENT  Current antimicrobial regimen includes a mismatch between antimicrobial dosage and estimated renal function.  As per policy approved by the Pharmacy & Therapeutics and Medical Executive Committees, the antimicrobial dosage will be adjusted accordingly.  Current antimicrobial dosage:  Ceftazidime IV 1 g Q8 hours  Indication: possible PNA  Renal Function:  Estimated Creatinine Clearance: 41.9 mL/min (A) (by C-G formula based on SCr of 1.14 mg/dL (H)).        On intermittent HD, scheduled:      On CRRT    Antimicrobial dosage has been changed to:    Ceftazidime IV 1 g Q12 hours  Additional comments:   Adline Potter, PharmD Pharmacy Resident Pager: [phone] 04/21/2017 1:56 PM

## 2017-04-21 NOTE — Progress Notes (Signed)
Chaplain saw patient holding on to pillow while rounding and wanted to introduce herself to patient and make her aware of spiritual care services.  Patient nodding her head at me but not talking at all to chaplain.  Patient's grandson is in room with patient.  Chaplain shared with patient what she does as Chaplain in providing additional support, prayer, times of reflection and conversation just in case the patient thought of "Chaplain" as being bad news.  Patient nodded her head.  Chaplain is available should she desire another visit.  Chaplain will be checking back in with patient on another day.      04/21/17 1134  Clinical Encounter Type  Visited With Patient;Family  Visit Type Initial;Psychological support;Spiritual support;Social support

## 2017-04-22 ENCOUNTER — Inpatient Hospital Stay (HOSPITAL_COMMUNITY): Payer: Medicare Other

## 2017-04-22 LAB — COMPREHENSIVE METABOLIC PANEL
ALT: 109 U/L — AB (ref 14–54)
AST: 123 U/L — AB (ref 15–41)
Albumin: 2.4 g/dL — ABNORMAL LOW (ref 3.5–5.0)
Alkaline Phosphatase: 77 U/L (ref 38–126)
Anion gap: 6 (ref 5–15)
BILIRUBIN TOTAL: 1.6 mg/dL — AB (ref 0.3–1.2)
BUN: 18 mg/dL (ref 6–20)
CO2: 30 mmol/L (ref 22–32)
CREATININE: 1.17 mg/dL — AB (ref 0.44–1.00)
Calcium: 8.1 mg/dL — ABNORMAL LOW (ref 8.9–10.3)
Chloride: 100 mmol/L — ABNORMAL LOW (ref 101–111)
GFR, EST AFRICAN AMERICAN: 53 mL/min — AB (ref >60)
GFR, EST NON AFRICAN AMERICAN: 46 mL/min — AB (ref >60)
Glucose, Bld: 84 mg/dL (ref 65–99)
Potassium: 3.5 mmol/L (ref 3.5–5.1)
Sodium: 136 mmol/L (ref 135–145)
TOTAL PROTEIN: 5.8 g/dL — AB (ref 6.5–8.1)

## 2017-04-22 LAB — CBC
HEMATOCRIT: 36.1 % (ref 36.0–46.0)
Hemoglobin: 12.1 g/dL (ref 12.0–15.0)
MCH: 27.3 pg (ref 26.0–34.0)
MCHC: 33.5 g/dL (ref 30.0–36.0)
MCV: 81.3 fL (ref 78.0–100.0)
PLATELETS: 146 10*3/uL — AB (ref 150–400)
RBC: 4.44 MIL/uL (ref 3.87–5.11)
RDW: 15 % (ref 11.5–15.5)
WBC: 9.1 10*3/uL (ref 4.0–10.5)

## 2017-04-22 LAB — BLOOD GAS, ARTERIAL
Acid-Base Excess: 6.9 mmol/L — ABNORMAL HIGH (ref 0.0–2.0)
Bicarbonate: 30.3 mmol/L — ABNORMAL HIGH (ref 20.0–28.0)
Drawn by: 40415
O2 Content: 3 L/min
O2 Saturation: 92.8 %
Patient temperature: 98.6
pCO2 arterial: 38.5 mmHg (ref 32.0–48.0)
pH, Arterial: 7.507 — ABNORMAL HIGH (ref 7.350–7.450)
pO2, Arterial: 61.3 mmHg — ABNORMAL LOW (ref 83.0–108.0)

## 2017-04-22 LAB — GLUCOSE, CAPILLARY
GLUCOSE-CAPILLARY: 123 mg/dL — AB (ref 65–99)
GLUCOSE-CAPILLARY: 135 mg/dL — AB (ref 65–99)
GLUCOSE-CAPILLARY: 180 mg/dL — AB (ref 65–99)
GLUCOSE-CAPILLARY: 74 mg/dL (ref 65–99)
GLUCOSE-CAPILLARY: 75 mg/dL (ref 65–99)
GLUCOSE-CAPILLARY: 86 mg/dL (ref 65–99)
Glucose-Capillary: 156 mg/dL — ABNORMAL HIGH (ref 65–99)

## 2017-04-22 LAB — COOXEMETRY PANEL
Carboxyhemoglobin: 1.2 % (ref 0.5–1.5)
Methemoglobin: 1.1 % (ref 0.0–1.5)
O2 Saturation: 53.2 %
Total hemoglobin: 6.9 g/dL — CL (ref 12.0–16.0)

## 2017-04-22 MED ORDER — POTASSIUM CHLORIDE 10 MEQ/50ML IV SOLN
10.0000 meq | INTRAVENOUS | Status: AC
  Administered 2017-04-22 (×3): 10 meq via INTRAVENOUS
  Filled 2017-04-22 (×4): qty 50

## 2017-04-22 MED ORDER — INSULIN DETEMIR 100 UNIT/ML ~~LOC~~ SOLN
20.0000 [IU] | Freq: Two times a day (BID) | SUBCUTANEOUS | Status: DC
  Administered 2017-04-22: 20 [IU] via SUBCUTANEOUS
  Filled 2017-04-22 (×2): qty 0.2

## 2017-04-22 MED ORDER — CARVEDILOL 6.25 MG PO TABS
6.2500 mg | ORAL_TABLET | Freq: Two times a day (BID) | ORAL | Status: DC
  Administered 2017-04-22 – 2017-04-28 (×12): 6.25 mg via ORAL
  Filled 2017-04-22 (×12): qty 1

## 2017-04-22 MED ORDER — OXYCODONE HCL 5 MG PO TABS: 5.0000 mg | ORAL_TABLET | ORAL | Status: DC | PRN

## 2017-04-22 NOTE — Progress Notes (Signed)
Patient was on Bipap for 2 hours and the Bipap was keeping her awake and slightly restless. RN placed patient on [place] 3L.  Patient doing well and RN will continue to monitor patient closely.

## 2017-04-22 NOTE — Progress Notes (Signed)
CT surgery p.m. Rounds  Andrea [name] had a wonderful day. Her delirium has completely resolved and she is pleasant and outgoing. Her acute respiratory failure has resolved and she is currently on room air. Her weakness is improved and she is now ambulating in hallway with assistance. Remains in sinus rhythm.

## 2017-04-22 NOTE — Progress Notes (Signed)
4 Days Post-Op Procedure(s) (LRB): CORONARY ARTERY BYPASS GRAFTING (CABG) times four using left internal mammary artery and right leg saphenous vein (N/A) TRANSESOPHAGEAL ECHOCARDIOGRAM (TEE) (N/A) Subjective: Getting stronger- walked in hall nsr Weaning drips Dc central line  Objective: Vital signs in last 24 hours: Temp:  [97.4 F (36.3 C)-98.4 F (36.9 C)] 97.4 F (36.3 C) (09/15 0749) Pulse Rate:  [79-109] 98 (09/15 1100) Cardiac Rhythm: Normal sinus rhythm (09/15 1100) Resp:  [7-29] 29 (09/15 1100) BP: (80-132)/(40-88) 118/88 (09/15 1100) SpO2:  [88 %-99 %] 98 % (09/15 1100) FiO2 (%):  [36 %] 36 % (09/14 1441) Weight:  [167 lb 12.3 oz (76.1 kg)] 167 lb 12.3 oz (76.1 kg) (09/15 0500)  Hemodynamic parameters for last 24 hours:  stable  Intake/Output from previous day: 09/14 0701 - 09/15 0700 In: 1238.6 [P.O.:480; I.V.:458.6; IV Piggyback:300] Out: 1845 [Urine:1845] Intake/Output this shift: Total I/O In: 581.2 [P.O.:290; I.V.:91.2; IV Piggyback:200] Out: 750 [Urine:750]       Exam    General- alert and comfortable   Lungs- clear without rales, wheezes   Cor- regular rate and rhythm, no murmur , gallop   Abdomen- soft, non-tender   Extremities - warm, non-tender, minimal edema   Neuro- oriented, appropriate, no focal weakness   Lab Results:  Recent Labs  04/21/17 0352 04/21/17 1639 04/22/17 0521  WBC 12.7*  --  9.1  HGB 9.0* 9.5* 12.1  HCT 26.6* 28.0* 36.1  PLT 142*  --  146*   BMET:  Recent Labs  04/21/17 0352 04/21/17 1639 04/22/17 0521  NA 135 139 136  K 2.7* 3.2* 3.5  CL 98* 94* 100*  CO2 30  --  30  GLUCOSE 180* 202* 84  BUN CREATININE 1.14* 1.10* 1.17*  CALCIUM 8.2*  --  8.1*    PT/INR: No results for input(s): LABPROT, INR in the last 72 hours. ABG    Component Value Date/Time   PHART 7.507 (H) 04/22/2017 0315   HCO3 30.3 (H) 04/22/2017 0315   TCO2 29 04/21/2017 1639   ACIDBASEDEF 1.0 04/19/2017 2221   O2SAT 53.2  04/22/2017 0450   CBG (last 3)   Recent Labs  04/22/17 0002 04/22/17 0406 04/22/17 0756  GLUCAP 86 75 74    Assessment/Plan: S/P Procedure(s) (LRB): CORONARY ARTERY BYPASS GRAFTING (CABG) times four using left internal mammary artery and right leg saphenous vein (N/A) TRANSESOPHAGEAL ECHOCARDIOGRAM (TEE) (N/A) Mobilize Diuresis Diabetes control increase coreg   LOS: 6 days    Kathlee Nations Trigt III 04/22/2017

## 2017-04-23 ENCOUNTER — Inpatient Hospital Stay (HOSPITAL_COMMUNITY): Payer: Medicare Other

## 2017-04-23 DIAGNOSIS — Z951 Presence of aortocoronary bypass graft: Secondary | ICD-10-CM

## 2017-04-23 LAB — GLUCOSE, CAPILLARY
GLUCOSE-CAPILLARY: 53 mg/dL — AB (ref 65–99)
GLUCOSE-CAPILLARY: 138 mg/dL — AB (ref 65–99)
GLUCOSE-CAPILLARY: 128 mg/dL — AB (ref 65–99)
GLUCOSE-CAPILLARY: 189 mg/dL — AB (ref 65–99)
GLUCOSE-CAPILLARY: 238 mg/dL — AB (ref 65–99)
GLUCOSE-CAPILLARY: 202 mg/dL — AB (ref 65–99)
Glucose-Capillary: 84 mg/dL (ref 65–99)
Glucose-Capillary: 77 mg/dL (ref 65–99)
Glucose-Capillary: 140 mg/dL — ABNORMAL HIGH (ref 65–99)
Glucose-Capillary: 165 mg/dL — ABNORMAL HIGH (ref 65–99)

## 2017-04-23 LAB — COMPREHENSIVE METABOLIC PANEL
ALT: 100 U/L — ABNORMAL HIGH (ref 14–54)
AST: 90 U/L — ABNORMAL HIGH (ref 15–41)
Albumin: 2.4 g/dL — ABNORMAL LOW (ref 3.5–5.0)
Alkaline Phosphatase: 81 U/L (ref 38–126)
Anion gap: 7 (ref 5–15)
BUN: 21 mg/dL — ABNORMAL HIGH (ref 6–20)
CO2: 28 mmol/L (ref 22–32)
Calcium: 8.3 mg/dL — ABNORMAL LOW (ref 8.9–10.3)
Chloride: 103 mmol/L (ref 101–111)
Creatinine, Ser: 1.23 mg/dL — ABNORMAL HIGH (ref 0.44–1.00)
GFR calc Af Amer: 50 mL/min — ABNORMAL LOW (ref >60)
GFR calc non Af Amer: 43 mL/min — ABNORMAL LOW (ref >60)
Glucose, Bld: 67 mg/dL (ref 65–99)
Potassium: 3.5 mmol/L (ref 3.5–5.1)
Sodium: 138 mmol/L (ref 135–145)
Total Bilirubin: 1.3 mg/dL — ABNORMAL HIGH (ref 0.3–1.2)
Total Protein: 5.7 g/dL — ABNORMAL LOW (ref 6.5–8.1)

## 2017-04-23 LAB — CBC
HCT: 26.3 % — ABNORMAL LOW (ref 36.0–46.0)
Hemoglobin: 8.6 g/dL — ABNORMAL LOW (ref 12.0–15.0)
MCH: 26.5 pg (ref 26.0–34.0)
MCHC: 32.7 g/dL (ref 30.0–36.0)
MCV: 81.2 fL (ref 78.0–100.0)
Platelets: 202 10*3/uL (ref 150–400)
RBC: 3.24 MIL/uL — ABNORMAL LOW (ref 3.87–5.11)
RDW: 14.9 % (ref 11.5–15.5)
WBC: 9.1 10*3/uL (ref 4.0–10.5)

## 2017-04-23 MED ORDER — FUROSEMIDE 40 MG PO TABS
40.0000 mg | ORAL_TABLET | Freq: Every day | ORAL | Status: DC
  Administered 2017-04-24 – 2017-04-26 (×3): 40 mg via ORAL
  Filled 2017-04-23 (×3): qty 1

## 2017-04-23 MED ORDER — INSULIN ASPART 100 UNIT/ML ~~LOC~~ SOLN
0.0000 [IU] | Freq: Every day | SUBCUTANEOUS | Status: DC
  Administered 2017-04-24 – 2017-04-26 (×3): 3 [IU] via SUBCUTANEOUS

## 2017-04-23 MED ORDER — INSULIN DETEMIR 100 UNIT/ML ~~LOC~~ SOLN
20.0000 [IU] | Freq: Every day | SUBCUTANEOUS | Status: DC
  Administered 2017-04-24: 20 [IU] via SUBCUTANEOUS
  Filled 2017-04-23 (×3): qty 0.2

## 2017-04-23 MED ORDER — POTASSIUM CHLORIDE CRYS ER 20 MEQ PO TBCR
20.0000 meq | EXTENDED_RELEASE_TABLET | ORAL | Status: AC
  Administered 2017-04-23 (×3): 20 meq via ORAL
  Filled 2017-04-23 (×3): qty 1

## 2017-04-23 MED ORDER — INSULIN ASPART 100 UNIT/ML ~~LOC~~ SOLN
0.0000 [IU] | Freq: Three times a day (TID) | SUBCUTANEOUS | Status: DC
  Administered 2017-04-23: 5 [IU] via SUBCUTANEOUS
  Administered 2017-04-23 – 2017-04-24 (×2): 3 [IU] via SUBCUTANEOUS
  Administered 2017-04-24: 8 [IU] via SUBCUTANEOUS
  Administered 2017-04-24: 5 [IU] via SUBCUTANEOUS
  Administered 2017-04-25: 8 [IU] via SUBCUTANEOUS
  Administered 2017-04-25 (×2): 3 [IU] via SUBCUTANEOUS
  Administered 2017-04-26: 5 [IU] via SUBCUTANEOUS
  Administered 2017-04-26 (×2): 3 [IU] via SUBCUTANEOUS
  Administered 2017-04-27: 2 [IU] via SUBCUTANEOUS
  Administered 2017-04-27: 3 [IU] via SUBCUTANEOUS
  Administered 2017-04-27: 5 [IU] via SUBCUTANEOUS

## 2017-04-23 MED ORDER — DEXTROSE 5 % IV SOLN
1.0000 g | Freq: Two times a day (BID) | INTRAVENOUS | Status: AC
  Administered 2017-04-23 – 2017-04-24 (×3): 1 g via INTRAVENOUS
  Filled 2017-04-23 (×4): qty 1

## 2017-04-23 NOTE — Progress Notes (Signed)
Hypoglycemic Event  CBG: 53  Treatment: 15 GM carbohydrate snack -- Orange Juice 4oz  Symptoms: Lethargic  Follow-up CBG: Time: 84 CBG Result: 0346  Possible Reasons for Event: Medication regimen: Levemir needs to be decreased      Jeannie Fend RN

## 2017-04-23 NOTE — Progress Notes (Signed)
5 Days Post-Op Procedure(s) (LRB): CORONARY ARTERY BYPASS GRAFTING (CABG) times four using left internal mammary artery and right leg saphenous vein (N/A) TRANSESOPHAGEAL ECHOCARDIOGRAM (TEE) (N/A) Subjective: Continues to progress nsr euvolemic Objective: Vital signs in last 24 hours: Temp:  [98.3 F (36.8 C)-99.2 F (37.3 C)] 98.3 F (36.8 C) (09/16 0830) Pulse Rate:  [72-98] 86 (09/16 1018) Cardiac Rhythm: Normal sinus rhythm (09/16 1018) Resp:  [9-30] 29 (09/16 1018) BP: (88-125)/(43-80) 113/69 (09/16 1018) SpO2:  [90 %-98 %] 95 % (09/16 1018) Weight:  [167 lb 3.2 oz (75.8 kg)] 167 lb 3.2 oz (75.8 kg) (09/16 0500)  Hemodynamic parameters for last 24 hours:   stable Intake/Output from previous day: 09/15 0701 - 09/16 0700 In: 2559.3 [P.O.:2125; I.V.:184.3; IV Piggyback:250] Out: 750 [Urine:750] Intake/Output this shift: Total I/O In: 530 [P.O.:480; IV Piggyback:50] Out: -        Exam    General- alert and comfortable   Lungs- clear without rales, wheezes   Cor- regular rate and rhythm, no murmur , gallop   Abdomen- soft, non-tender   Extremities - warm, non-tender, minimal edema   Neuro- oriented, appropriate, no focal weakness   Lab Results:  Recent Labs  04/22/17 0521 04/23/17 0228  WBC 9.1 9.1  HGB 12.1 8.6*  HCT 36.1 26.3*  PLT 146* 202   BMET:  Recent Labs  04/22/17 0521 04/23/17 0228  NA 136 138  K 3.5 3.5  CL 100* 103  CO2 30 28  GLUCOSE 84 67  BUN 18 21*  CREATININE 1.17* 1.23*  CALCIUM 8.1* 8.3*    PT/INR: No results for input(s): LABPROT, INR in the last 72 hours. ABG    Component Value Date/Time   PHART 7.507 (H) 04/22/2017 0315   HCO3 30.3 (H) 04/22/2017 0315   TCO2 29 04/21/2017 1639   ACIDBASEDEF 1.0 04/19/2017 2221   O2SAT 53.2 04/22/2017 0450   CBG (last 3)   Recent Labs  04/23/17 0558 04/23/17 0627 04/23/17 0828  GLUCAP 140* 138* 128*    Assessment/Plan: S/P Procedure(s) (LRB): CORONARY ARTERY BYPASS  GRAFTING (CABG) times four using left internal mammary artery and right leg saphenous vein (N/A) TRANSESOPHAGEAL ECHOCARDIOGRAM (TEE) (N/A) Mobilize Diuresis tx to stepdown tomorrow Plan home with [facility], HPT  LOS: 7 days    Kathlee Nations Trigt III 04/23/2017

## 2017-04-24 ENCOUNTER — Inpatient Hospital Stay (HOSPITAL_COMMUNITY): Payer: Medicare Other

## 2017-04-24 LAB — BASIC METABOLIC PANEL
Anion gap: 5 (ref 5–15)
BUN: 18 mg/dL (ref 6–20)
CO2: 28 mmol/L (ref 22–32)
Calcium: 8.4 mg/dL — ABNORMAL LOW (ref 8.9–10.3)
Chloride: 103 mmol/L (ref 101–111)
Creatinine, Ser: 1.21 mg/dL — ABNORMAL HIGH (ref 0.44–1.00)
GFR calc Af Amer: 51 mL/min — ABNORMAL LOW (ref >60)
GFR calc non Af Amer: 44 mL/min — ABNORMAL LOW (ref >60)
Glucose, Bld: 183 mg/dL — ABNORMAL HIGH (ref 65–99)
Potassium: 4.5 mmol/L (ref 3.5–5.1)
Sodium: 136 mmol/L (ref 135–145)

## 2017-04-24 LAB — CBC
HCT: 26.8 % — ABNORMAL LOW (ref 36.0–46.0)
Hemoglobin: 8.8 g/dL — ABNORMAL LOW (ref 12.0–15.0)
MCH: 27.1 pg (ref 26.0–34.0)
MCHC: 32.8 g/dL (ref 30.0–36.0)
MCV: 82.5 fL (ref 78.0–100.0)
Platelets: 217 10*3/uL (ref 150–400)
RBC: 3.25 MIL/uL — ABNORMAL LOW (ref 3.87–5.11)
RDW: 15.2 % (ref 11.5–15.5)
WBC: 8.6 10*3/uL (ref 4.0–10.5)

## 2017-04-24 LAB — GLUCOSE, CAPILLARY
Glucose-Capillary: 166 mg/dL — ABNORMAL HIGH (ref 65–99)
Glucose-Capillary: 261 mg/dL — ABNORMAL HIGH (ref 65–99)
Glucose-Capillary: 238 mg/dL — ABNORMAL HIGH (ref 65–99)

## 2017-04-24 MED ORDER — DIPHENHYDRAMINE HCL 25 MG PO CAPS
25.0000 mg | ORAL_CAPSULE | Freq: Once | ORAL | Status: AC
  Administered 2017-04-24: 25 mg via ORAL
  Filled 2017-04-24: qty 1

## 2017-04-24 NOTE — Progress Notes (Signed)
Report given to [facility] on 4E. Pt notified of transfer 4E14. Will continue to monitor until transfer.

## 2017-04-24 NOTE — Progress Notes (Signed)
6 Days Post-Op Procedure(s) (LRB): CORONARY ARTERY BYPASS GRAFTING (CABG) times four using left internal mammary artery and right leg saphenous vein (N/A) TRANSESOPHAGEAL ECHOCARDIOGRAM (TEE) (N/A) Subjective: No problems other than insomnia Waiting for telemetry bed 4E  Objective: Vital signs in last 24 hours: Temp:  [98.2 F (36.8 C)-99.5 F (37.5 C)] 98.8 F (37.1 C) (09/17 1153) Pulse Rate:  [72-94] 89 (09/17 1400) Cardiac Rhythm: Normal sinus rhythm (09/17 0700) Resp:  [18-31] 25 (09/17 1504) BP: (89-163)/(32-106) 124/86 (09/17 1504) SpO2:  [91 %-100 %] 94 % (09/17 1504) Weight:  [164 lb 14.5 oz (74.8 kg)] 164 lb 14.5 oz (74.8 kg) (09/17 0700)  Hemodynamic parameters for last 24 hours:   stable Intake/Output from previous day: 09/16 0701 - 09/17 0700 In: 940 [P.O.:840; IV Piggyback:100] Out: 1600 [Urine:1600] Intake/Output this shift: Total I/O In: 293 [P.O.:240; I.V.:3; IV Piggyback:50] Out: 500 [Urine:500]       Exam    General- alert and comfortable   Lungs- clear without rales, wheezes   Cor- regular rate and rhythm, no murmur , gallop   Abdomen- soft, non-tender   Extremities - warm, non-tender, minimal edema   Neuro- oriented, appropriate, no focal weakness   Lab Results:  Recent Labs  04/23/17 0228 04/24/17 0231  WBC 9.1 8.6  HGB 8.6* 8.8*  HCT 26.3* 26.8*  PLT 202 217   BMET:  Recent Labs  04/23/17 0228 04/24/17 0231  NA 138 136  K 3.5 4.5  CL 103 103  CO2 28 28  GLUCOSE 67 183*  BUN 21* 18  CREATININE 1.23* 1.21*  CALCIUM 8.3* 8.4*    PT/INR: No results for input(s): LABPROT, INR in the last 72 hours. ABG    Component Value Date/Time   PHART 7.507 (H) 04/22/2017 0315   HCO3 30.3 (H) 04/22/2017 0315   TCO2 29 04/21/2017 1639   ACIDBASEDEF 1.0 04/19/2017 2221   O2SAT 53.2 04/22/2017 0450   CBG (last 3)   Recent Labs  04/23/17 2141 04/24/17 0817 04/24/17 1152  GLUCAP 165* 166* 261*    Assessment/Plan: S/P Procedure(s)  (LRB): CORONARY ARTERY BYPASS GRAFTING (CABG) times four using left internal mammary artery and right leg saphenous vein (N/A) TRANSESOPHAGEAL ECHOCARDIOGRAM (TEE) (N/A) Mobilize Diuresis Diabetes control transfer to tele bed   LOS: 8 days    Kathlee Nations Trigt III 04/24/2017

## 2017-04-24 NOTE — Progress Notes (Signed)
PT Cancellation Note  Patient Details Name: Andrea [name] MRN: [id number] DOB: 10-10-1945   Cancelled Treatment:    Reason Eval/Treat Not Completed: Patient declined, no reason specified Pt declined earlier this pm and wanted to finish lunch. Pt and RN reported that pt ambulated unit this am with nursing staff. PT will continue to follow acutely.    Derek Mound, PTA Pager: [phone]   04/24/2017, 3:53 PM

## 2017-04-24 NOTE — Progress Notes (Signed)
Patient ID: Andrea [name], female   DOB: Jan 18, 1946, 71 y.o.   MRN: [id number] EVENING ROUNDS NOTE :     [address]       [place] [address]             [phone]                 6 Days Post-Op Procedure(s) (LRB): CORONARY ARTERY BYPASS GRAFTING (CABG) times four using left internal mammary artery and right leg saphenous vein (N/A) TRANSESOPHAGEAL ECHOCARDIOGRAM (TEE) (N/A)  Total Length of Stay:  LOS: 8 days  BP 131/69   Pulse 85   Temp 99.5 F (37.5 C) (Oral)   Resp (!) 34   Ht  (1.549 m)   Wt 164 lb 14.5 oz (74.8 kg)   SpO2 95%   BMI 31.16 kg/m   .Intake/Output      09/16 0701 - 09/17 0700 09/17 0701 - 09/18 0700   P.O. 840 480   I.V. (mL/kg)  3 (0)   IV Piggyback 100 50   Total Intake(mL/kg) 940 (12.6) 533 (7.1)   Urine (mL/kg/hr) 1600 (0.9) 500 (0.6)   Total Output 1600 500   Net -660 +33        Urine Occurrence 700 x    Stool Occurrence 1 x      . sodium chloride    . cefTAZidime (FORTAZ)  IV Stopped (04/24/17 1049)     Lab Results  Component Value Date   WBC 8.6 04/24/2017   HGB 8.8 (L) 04/24/2017   HCT 26.8 (L) 04/24/2017   PLT 217 04/24/2017   GLUCOSE 183 (H) 04/24/2017   CHOL 117 04/17/2017   TRIG 207 (H) 04/17/2017   HDL 35 (L) 04/17/2017   LDLCALC 41 04/17/2017   ALT 100 (H) 04/23/2017   AST 90 (H) 04/23/2017   NA 136 04/24/2017   K 4.5 04/24/2017   CL 103 04/24/2017   CREATININE 1.21 (H) 04/24/2017   BUN 18 04/24/2017   CO2 28 04/24/2017   TSH 2.107 04/17/2017   INR 1.42 04/18/2017   HGBA1C 7.8 (H) 04/17/2017   Waiting for  4e bed Ambulating well   Delight Ovens MD  Beeper [phone] Office [phone] 04/24/2017 6:41 PM

## 2017-04-25 LAB — GLUCOSE, CAPILLARY
GLUCOSE-CAPILLARY: 183 mg/dL — AB (ref 65–99)
GLUCOSE-CAPILLARY: 215 mg/dL — AB (ref 65–99)
GLUCOSE-CAPILLARY: 281 mg/dL — AB (ref 65–99)
Glucose-Capillary: 180 mg/dL — ABNORMAL HIGH (ref 65–99)

## 2017-04-25 LAB — BASIC METABOLIC PANEL
Anion gap: 8 (ref 5–15)
BUN: 17 mg/dL (ref 6–20)
CO2: 26 mmol/L (ref 22–32)
Calcium: 8.7 mg/dL — ABNORMAL LOW (ref 8.9–10.3)
Chloride: 103 mmol/L (ref 101–111)
Creatinine, Ser: 1.17 mg/dL — ABNORMAL HIGH (ref 0.44–1.00)
GFR calc Af Amer: 53 mL/min — ABNORMAL LOW (ref >60)
GFR calc non Af Amer: 46 mL/min — ABNORMAL LOW (ref >60)
Glucose, Bld: 200 mg/dL — ABNORMAL HIGH (ref 65–99)
Potassium: 4.1 mmol/L (ref 3.5–5.1)
Sodium: 137 mmol/L (ref 135–145)

## 2017-04-25 LAB — CBC
HCT: 26.2 % — ABNORMAL LOW (ref 36.0–46.0)
Hemoglobin: 8.6 g/dL — ABNORMAL LOW (ref 12.0–15.0)
MCH: 27 pg (ref 26.0–34.0)
MCHC: 32.8 g/dL (ref 30.0–36.0)
MCV: 82.4 fL (ref 78.0–100.0)
Platelets: 252 10*3/uL (ref 150–400)
RBC: 3.18 MIL/uL — ABNORMAL LOW (ref 3.87–5.11)
RDW: 15 % (ref 11.5–15.5)
WBC: 9.8 10*3/uL (ref 4.0–10.5)

## 2017-04-25 MED ORDER — INSULIN DETEMIR 100 UNIT/ML ~~LOC~~ SOLN
23.0000 [IU] | Freq: Every day | SUBCUTANEOUS | Status: DC
  Administered 2017-04-25: 23 [IU] via SUBCUTANEOUS
  Filled 2017-04-25: qty 0.23

## 2017-04-25 NOTE — Progress Notes (Signed)
Physical Therapy Treatment Patient Details Name: Andrea [name] MRN: [id number] DOB: [birth year] Today's Date: 04/25/2017    History of Present Illness 71 y.o. female with a history of h/o T2DM, HTN, HLD and prior stroke admitted with CP, NSTEMI s/p cath and CABG x 4 on 9/11, extubated 9/12    PT Comments    Patient is progressing toward mobility goals and tolerated increased gait distance this session. VSS on RA throughout session. Pt required grossly min A for mobility. Pt needs mod vc for maintaining sternal precautions. Did not assess bed mobility this session however pt reported getting assistance from RN. Pt will need 24 hour supervision/assistance upon d/c. If family cannot provide 24 hour care pt will need post acute rehab.   Follow Up Recommendations  Supervision/Assistance - 24 hour;Home health PT     Equipment Recommendations  Rolling walker with 5" wheels    Recommendations for Other Services OT consult     Precautions / Restrictions Precautions Precautions: Sternal;Fall Restrictions Weight Bearing Restrictions: Yes (sternal precautions)    Mobility  Bed Mobility               General bed mobility comments: pt sitting EOB upon arrival  Transfers Overall transfer level: Needs assistance Equipment used: Rolling walker (2 wheeled) Transfers: Sit to/from Stand Sit to Stand: Min guard;Min assist         General transfer comment: cues for hand placement and technique to maintian sternal precautions; assist to rise initial trial  Ambulation/Gait Ambulation/Gait assistance: Min guard;Min assist Ambulation Distance (Feet): 200 Feet Assistive device: Rolling walker (2 wheeled) Gait Pattern/deviations: Step-through pattern;Decreased step length - left;Decreased dorsiflexion - left Gait velocity: decreased   General Gait Details: cues for safe use of AD especially when turning and vc for increased bilat step length and cadence; pt with decreased L step length  and drifting toward L side likely due to L visual deficits   Stairs            Wheelchair Mobility    Modified Rankin (Stroke Patients Only)       Balance Overall balance assessment: Needs assistance   Sitting balance-Leahy Scale: Good       Standing balance-Leahy Scale: Poor                              Cognition Arousal/Alertness: Awake/alert Behavior During Therapy: WFL for tasks assessed/performed Overall Cognitive Status: Within Functional Limits for tasks assessed                                        Exercises      General Comments General comments (skin integrity, edema, etc.): VSS on RA      Pertinent Vitals/Pain Pain Assessment: No/denies pain    Home Living                      Prior Function            PT Goals (current goals can now be found in the care plan section) Acute Rehab PT Goals PT Goal Formulation: With family Time For Goal Achievement: 05/04/17 Potential to Achieve Goals: Fair Progress towards PT goals: Progressing toward goals    Frequency    Min 3X/week      PT Plan Current plan remains appropriate    Co-evaluation  AM-PAC PT "6 Clicks" Daily Activity  Outcome Measure  Difficulty turning over in bed (including adjusting bedclothes, sheets and blankets)?: A Lot Difficulty moving from lying on back to sitting on the side of the bed? : Unable Difficulty sitting down on and standing up from a chair with arms (e.g., wheelchair, bedside commode, etc,.)?: Unable Help needed moving to and from a bed to chair (including a wheelchair)?: A Little Help needed walking in hospital room?: A Little Help needed climbing 3-5 steps with a railing? : A Lot 6 Click Score: 12    End of Session Equipment Utilized During Treatment: Gait belt Activity Tolerance: Patient tolerated treatment well Patient left: in chair;with call bell/phone within reach;with family/visitor  present Nurse Communication: Mobility status;Precautions PT Visit Diagnosis: Other abnormalities of gait and mobility (R26.89);Difficulty in walking, not elsewhere classified (R26.2);Muscle weakness (generalized) (M62.81)     Time: 6213-0865 PT Time Calculation (min) (ACUTE ONLY): 55 min  Charges:  $Gait Training: 23-37 mins $Therapeutic Activity: 23-37 mins                    G Codes:       Erline Levine, PTA Pager: [phone]     Carolynne Edouard 04/25/2017, 12:43 PM

## 2017-04-25 NOTE — Progress Notes (Addendum)
      [address]       [place] [address]             [phone]      7 Days Post-Op Procedure(s) (LRB): CORONARY ARTERY BYPASS GRAFTING (CABG) times four using left internal mammary artery and right leg saphenous vein (N/A) TRANSESOPHAGEAL ECHOCARDIOGRAM (TEE) (N/A) Subjective: The patient and family are nervous for life outside of the hospital. They want to discuss possibly a SNF or rehab since there will be no one to help at home.   Objective: Vital signs in last 24 hours: Temp:  [98.8 F (37.1 C)-99.5 F (37.5 C)] 98.9 F (37.2 C) (09/18 0400) Pulse Rate:  [72-93] 72 (09/18 0400) Cardiac Rhythm: Normal sinus rhythm (09/18 0713) Resp:  [18-34] 23 (09/18 0400) BP: (98-155)/(61-89) 155/72 (09/18 0400) SpO2:  [90 %-98 %] 90 % (09/18 0400) Weight:  [74.7 kg (164 lb 10.9 oz)] 74.7 kg (164 lb 10.9 oz) (09/18 0400)     Intake/Output from previous day: 09/17 0701 - 09/18 0700 In: 533 [P.O.:480; I.V.:3; IV Piggyback:50] Out: 500 [Urine:500] Intake/Output this shift: No intake/output data recorded.  General appearance: alert, cooperative and no distress Heart: regular rate and rhythm, S1, S2 normal, no murmur, click, rub or gallop Lungs: clear to auscultation bilaterally Abdomen: soft, non-tender; bowel sounds normal; no masses,  no organomegaly Extremities: venous stasis dermatitis noted Wound: Sternal incision c/d/i without drainage. Right and left EVH sites c/d/i  Lab Results:  Recent Labs  04/24/17 0231 04/25/17 0348  WBC 8.6 9.8  HGB 8.8* 8.6*  HCT 26.8* 26.2*  PLT 217 252   BMET:  Recent Labs  04/24/17 0231 04/25/17 0348  NA 136 137  K 4.5 4.1  CL 103 103  CO2 28 26  GLUCOSE 183* 200*  BUN 18 17  CREATININE 1.21* 1.17*  CALCIUM 8.4* 8.7*    PT/INR: No results for input(s): LABPROT, INR in the last 72 hours. ABG    Component Value Date/Time   PHART 7.507 (H) 04/22/2017 0315   HCO3 30.3 (H) 04/22/2017 0315   TCO2 29 04/21/2017  1639   ACIDBASEDEF 1.0 04/19/2017 2221   O2SAT 53.2 04/22/2017 0450   CBG (last 3)   Recent Labs  04/24/17 1152 04/24/17 1555 04/25/17 0604  GLUCAP 261* 238* 183*    Assessment/Plan: S/P Procedure(s) (LRB): CORONARY ARTERY BYPASS GRAFTING (CABG) times four using left internal mammary artery and right leg saphenous vein (N/A) TRANSESOPHAGEAL ECHOCARDIOGRAM (TEE) (N/A) 1. CV-NSR in the 70s. If BP continues to climb may need to add home BP meds.  2. Pulm-Remains on 2L [place] with good oxygenation. CXR from yesterday showed bibasilar atelectatic changed and small bilateral pleural effusions.  3. Renal-creatinine stable. Electrolytes okay 4. Endo-poorly controlled blood glucose level. Will increase Levemir. Continue SSI 5. H and H stable, platelets trending up 6.  PT/OT  Plan: PT/OT to evaluate for possible need for SNF vs inpatient rehab placement. Will also consult case management and social work. Continue to wean oxygen as tolerated.  Encouraged use of incentive spirometer. Ambulate in the halls today.     LOS: 9 days    Sharlene Dory 04/25/2017  patient examined and medical record reviewed,agree with above note. Kathlee Nations Trigt III 04/25/2017

## 2017-04-25 NOTE — Care Management Important Message (Signed)
Important Message  Patient Details  Name: Andrea [name] MRN: [id number] Date of Birth: 1945-08-28   Medicare Important Message Given:  Yes    [name] Abena 04/25/2017, 9:00 AM

## 2017-04-25 NOTE — Clinical Social Work Note (Signed)
Clinical Social Work Assessment  Patient Details  Name: Andrea [name] MRN: [id number] Date of Birth: [birth year]  Date of referral:  04/25/17               Reason for consult:  Facility Placement                Permission sought to share information with:  Facility Art therapist granted to share information::  Yes, Verbal Permission Granted  Name::     daughter, Tilden Fossa::  SNF  Relationship::     Contact Information:     Housing/Transportation Living arrangements for the past 2 months:  Single Family Home Source of Information:  Patient, Adult Children, Friend/Neighbor Patient Interpreter Needed:  None Criminal Activity/Legal Involvement Pertinent to Current Situation/Hospitalization:  No - Comment as needed Significant Relationships:  Adult Children, Spouse, Friend Lives with:  Spouse Do you feel safe going back to the place where you live?  Yes Need for family participation in patient care:  Yes (Comment)  Care giving concerns:  CSW met with patient at bedside with daughter and family friend. Pt desires to return home however is open to [place] working on SNF placement. Pt has some experience with SNF through family member.  Pt has good family support.  Social Worker assessment / plan:  CSW discussed her role and SNF placement and options based on recommendations from clinical team. CSW explained SNF  Process for placement.  Pt gave permission to send out to [facility], [place] area. Pt would like a good SNF if she has to go, but really desires to go home.  CSW will continue to follow-up on same.  Employment status:  Retired Nurse, adult PT Recommendations:  [place] / Referral to community resources:  [place]  Patient/Family's Response to care:  Patient appreciative of CSW f/u and explaining SNF process. No issues or concerns identified.  Patient/Family's Understanding of and  Emotional Response to Diagnosis, Current Treatment, and Prognosis:  Patient has good understanding of diagnosis, current treatment and prognosis. Pt hoping to return home when medically stable to do so and would be willing to go to SNF. No issues or concerns identified.  Emotional Assessment Appearance:  Appears stated age Attitude/Demeanor/Rapport:   (Cooperative) Affect (typically observed):  Accepting, Appropriate Orientation:  Oriented to Self, Oriented to Place, Oriented to  Time, Oriented to Situation Alcohol / Substance use:  Not Applicable Psych involvement (Current and /or in the community):  No (Comment)  Discharge Needs  Concerns to be addressed:  Care Coordination Readmission within the last 30 days:  No Current discharge risk:  Dependent with Mobility, Physical Impairment Barriers to Discharge:  No Barriers Identified   Normajean Baxter, LCSW 04/25/2017, 4:59 PM

## 2017-04-26 ENCOUNTER — Encounter (HOSPITAL_COMMUNITY): Payer: Self-pay | Admitting: Physical Medicine and Rehabilitation

## 2017-04-26 DIAGNOSIS — R5381 Other malaise: Secondary | ICD-10-CM

## 2017-04-26 LAB — GLUCOSE, CAPILLARY
GLUCOSE-CAPILLARY: 149 mg/dL — AB (ref 65–99)
GLUCOSE-CAPILLARY: 169 mg/dL — AB (ref 65–99)
Glucose-Capillary: 205 mg/dL — ABNORMAL HIGH (ref 65–99)

## 2017-04-26 MED ORDER — SORBITOL 70 % PO SOLN
30.0000 mL | Freq: Once | ORAL | Status: AC
  Administered 2017-04-26: 30 mL via ORAL
  Filled 2017-04-26: qty 30

## 2017-04-26 MED ORDER — FUROSEMIDE 10 MG/ML IJ SOLN
40.0000 mg | Freq: Every day | INTRAMUSCULAR | Status: DC
  Administered 2017-04-27 – 2017-04-28 (×2): 40 mg via INTRAVENOUS
  Filled 2017-04-26 (×2): qty 4

## 2017-04-26 MED ORDER — INSULIN DETEMIR 100 UNIT/ML ~~LOC~~ SOLN
26.0000 [IU] | Freq: Every day | SUBCUTANEOUS | Status: DC
  Administered 2017-04-27: 26 [IU] via SUBCUTANEOUS
  Filled 2017-04-26: qty 0.26

## 2017-04-26 NOTE — Progress Notes (Signed)
Physical Therapy Treatment Patient Details Name: Andrea [name] MRN: [id number] DOB: [birth year] Today's Date: 04/26/2017    History of Present Illness 71 y.o. female with a history of h/o T2DM, HTN, HLD and prior stroke admitted with CP, NSTEMI s/p cath and CABG x 4 on 9/11, extubated 9/12    PT Comments    Pt continues to require minA for transfers and ambulation to maintain safety. Pt discussed with PT that she would feel better going to SNF to be stronger and more independent with her gait before she goes home. Pt will continue to benefit from skilled PT in the acute setting to improve strength and endurance in preparation for discharge to SNF.   Follow Up Recommendations  SNF     Equipment Recommendations  Rolling walker with 5" wheels    Recommendations for Other Services OT consult     Precautions / Restrictions Precautions Precautions: Sternal;Fall Restrictions Weight Bearing Restrictions: Yes (sternal precautions)    Mobility  Bed Mobility               General bed mobility comments: pt sitting in recliner upon arrival   Transfers Overall transfer level: Needs assistance Equipment used: Rolling walker (2 wheeled) Transfers: Sit to/from Stand Sit to Stand: Min assist         General transfer comment: minA for power up, vc for hand placment on knees for maintaining sternal precautions  Ambulation/Gait Ambulation/Gait assistance: Min guard;Min assist Ambulation Distance (Feet): 250 Feet Assistive device: Rolling walker (2 wheeled) Gait Pattern/deviations: Step-through pattern;Decreased step length - left;Decreased dorsiflexion - left Gait velocity: decreased Gait velocity interpretation: Below normal speed for age/gender General Gait Details: minA for RW management, vc for keeping L LE inside RW, continues to have decreased L step length and drifting to L           Balance Overall balance assessment: Needs assistance   Sitting balance-Leahy  Scale: Good       Standing balance-Leahy Scale: Poor                              Cognition Arousal/Alertness: Awake/alert Behavior During Therapy: WFL for tasks assessed/performed Overall Cognitive Status: Within Functional Limits for tasks assessed                                           General Comments General comments (skin integrity, edema, etc.): VSS on 2L O2 via nasal cannula SaO2 remained above 95%O2      Pertinent Vitals/Pain Pain Assessment: No/denies pain           PT Goals (current goals can now be found in the care plan section) Acute Rehab PT Goals PT Goal Formulation: With family Time For Goal Achievement: 05/04/17 Potential to Achieve Goals: Fair Progress towards PT goals: Progressing toward goals    Frequency    Min 3X/week      PT Plan Discharge plan needs to be updated    Co-evaluation              AM-PAC PT "6 Clicks" Daily Activity  Outcome Measure  Difficulty turning over in bed (including adjusting bedclothes, sheets and blankets)?: A Lot Difficulty moving from lying on back to sitting on the side of the bed? : Unable Difficulty sitting down on and standing up from a chair with arms (  e.g., wheelchair, bedside commode, etc,.)?: Unable Help needed moving to and from a bed to chair (including a wheelchair)?: A Little Help needed walking in hospital room?: A Little Help needed climbing 3-5 steps with a railing? : A Lot 6 Click Score: 12    End of Session Equipment Utilized During Treatment: Gait belt Activity Tolerance: Patient tolerated treatment well Patient left: in chair;with call bell/phone within reach;with family/visitor present Nurse Communication: Mobility status;Precautions PT Visit Diagnosis: Other abnormalities of gait and mobility (R26.89);Difficulty in walking, not elsewhere classified (R26.2);Muscle weakness (generalized) (M62.81)     Time: 3086-5784 PT Time Calculation (min) (ACUTE  ONLY): 20 min  Charges:  $Gait Training: 8-22 mins                    G Codes:       Chandi Nicklin B. Beverely Risen PT, DPT Acute Rehabilitation  [phone] Pager [phone]     Elon Alas Fleet 04/26/2017, 4:52 PM

## 2017-04-26 NOTE — Progress Notes (Signed)
Pacing wires discontinued per orders.

## 2017-04-26 NOTE — Consult Note (Signed)
Physical Medicine and Rehabilitation Consult  Reason for Consult: Debility Referring Physician: Dr. Donata Clay.    HPI: Andrea [name] is a 71 y.o. female with history of T2DM, macular degeneration, CVA with residual mild left sided weakness, progressive angina; who was admitted on 04/16/17 with chest pain with ST changes due to NSTEMI and concerns of left main disease. She underwent cardiac cath revealing severe 3V CAD with preserved LV function.  She was evaluated by Dr. Donata Clay and underwent CABG X 4 on 04/18/17. Post op course significant for hypoxia, fluid overload requiring diuresis and ABLA.  Endurance improving but she requires cues for safety due to visual field deficits and need to maintain sternal precautions. MD recommending CIR.   Patient feels that physically she can do her own self-care and mobility at home, but is concerned that her family will try to make her cook and clean once again.    Review of Systems  HENT: Negative for hearing loss and tinnitus.   Eyes: Positive for blurred vision (minimal vision left eye).  Respiratory: Negative for cough and shortness of breath.   Cardiovascular: Positive for leg swelling. Negative for chest pain and palpitations.  Gastrointestinal: Positive for heartburn (very bad for past few months). Negative for constipation and nausea.  Genitourinary: Negative for dysuria and urgency.  Musculoskeletal: Negative for myalgias and neck pain.  Skin: Negative for itching and rash.  Neurological: Positive for dizziness, focal weakness and weakness. Negative for tingling and headaches.  Psychiatric/Behavioral: Negative for memory loss. The patient does not have insomnia.       Past Medical History:  Diagnosis Date  . Diabetes mellitus   . Hypertension   . Stroke (HCC) 12/2011   no residuals noted    Past Surgical History:  Procedure Laterality Date  . CHOLECYSTECTOMY    . CORONARY ARTERY BYPASS GRAFT N/A 04/18/2017   Procedure:  CORONARY ARTERY BYPASS GRAFTING (CABG) times four using left internal mammary artery and right leg saphenous vein;  Surgeon: Kerin Perna, MD;  Location: [facility] OR;  Service: Open Heart Surgery;  Laterality: N/A;  . LEFT HEART CATH AND CORONARY ANGIOGRAPHY N/A 04/17/2017   Procedure: LEFT HEART CATH AND CORONARY ANGIOGRAPHY;  Surgeon: Marykay Lex, MD;  Location: [facility] INVASIVE CV LAB;  Service: Cardiovascular;  Laterality: N/A;  . ROTATOR CUFF REPAIR     left  . TEE WITHOUT CARDIOVERSION N/A 04/18/2017   Procedure: TRANSESOPHAGEAL ECHOCARDIOGRAM (TEE);  Surgeon: Donata Clay, Theron Arista, MD;  Location: [facility] OR;  Service: Open Heart Surgery;  Laterality: N/A;    Family History  Problem Relation Age of Onset  . Heart attack Father   . Hypertension Father   . Heart attack Brother   . Hypertension Brother   . Hypertension Mother   . Stroke Mother   . Healthy Brother     Social History:   Married. Lives with husband and children. Is a retired Statistician from [employer]  Goes to gym for chair yoga 1-2 days a week. She reports that she quit smoking about 20 years ago. Her smoking use included Cigarettes. She has a 10.00 pack-year smoking history. She has never used smokeless tobacco. She reports that she does not drink alcohol or use drugs.    Allergies: No Known Allergies    Medications Prior to Admission  Medication Sig Dispense Refill  . aspirin 81 MG tablet Take 81 mg by mouth daily.    . Cholecalciferol (VITAMIN D)  2000 UNITS tablet Take 2,000 Units by mouth daily.    . insulin detemir (LEVEMIR) 100 UNIT/ML injection Inject 20-100 Units into the skin See admin instructions. Sliding Scale    . naproxen sodium (ANAPROX) 220 MG tablet Take 220 mg by mouth 2 (two) times daily as needed. For pain.    . nitroGLYCERIN (NITROSTAT) 0.4 MG SL tablet Place 1 tablet (0.4 mg total) under the tongue every 5 (five) minutes as needed for chest pain. Take if chest pressure is present after 3-5 min of rest 25  tablet 3  . Olmesartan-Amlodipine-HCTZ 40-10-25 MG TABS Take 1 tablet by mouth daily.  1  . sitaGLIPtan-metformin (JANUMET) 50-1000 MG per tablet Take 1 tablet by mouth 2 (two) times daily with a meal.    . VESICARE 10 MG tablet Take 10 mg by mouth daily.  5  . vitamin E 400 UNIT capsule Take 400 Units by mouth daily.    Letta Pate VERIO test strip Use as directed four times daily to test blood sugar level.  12    Home: Home Living Family/patient expects to be discharged to:: Private residence Living Arrangements: Spouse/significant other Available Help at Discharge: Family, Available 24 hours/day Type of Home: House Home Access: Stairs to enter Secretary/administrator of Steps: 6 Entrance Stairs-Rails: Right Home Layout: One level Bathroom Shower/Tub: Health visitor: Standard Home Equipment: None  Functional History: Prior Function Level of Independence: Independent Functional Status:  Mobility: Bed Mobility General bed mobility comments: pt sitting EOB upon arrival Transfers Overall transfer level: Needs assistance Equipment used: Rolling walker (2 wheeled) Transfers: Sit to/from Stand Sit to Stand: Min guard, Min assist General transfer comment: cues for hand placement and technique to maintian sternal precautions; assist to rise initial trial Ambulation/Gait Ambulation/Gait assistance: Min guard, Min assist Ambulation Distance (Feet): 200 Feet Assistive device: Rolling walker (2 wheeled) Gait Pattern/deviations: Step-through pattern, Decreased step length - left, Decreased dorsiflexion - left General Gait Details: cues for safe use of AD especially when turning and vc for increased bilat step length and cadence; pt with decreased L step length and drifting toward L side likely due to L visual deficits Gait velocity: decreased    ADL:    Cognition: Cognition Overall Cognitive Status: Within Functional Limits for tasks assessed Orientation Level:  Oriented X4 Cognition Arousal/Alertness: Awake/alert Behavior During Therapy: WFL for tasks assessed/performed Overall Cognitive Status: Within Functional Limits for tasks assessed Area of Impairment: Memory, Attention, Orientation, Following commands, Safety/judgement Orientation Level: Disoriented to, Time, Situation Current Attention Level: Focused Memory: Decreased recall of precautions, Decreased short-term memory Following Commands: Follows one step commands with increased time, Follows one step commands inconsistently Safety/Judgement: Decreased awareness of deficits, Decreased awareness of safety General Comments: pt with very flat affect, very limited answers to questions but would answer yes appropriately with 3 choices.   Blood pressure 120/69, pulse 72, temperature 98.5 F (36.9 C), resp. rate 18, height  (1.549 m), weight 75.5 kg (166 lb 8 oz), SpO2 93 %. Physical Exam  Nursing note and vitals reviewed. Constitutional: She is oriented to person, place, and time. She appears well-developed and well-nourished.  HENT:  Head: Normocephalic and atraumatic.  Mouth/Throat: Oropharynx is clear and moist.  Eyes: Pupils are equal, round, and reactive to light. Conjunctivae and EOM are normal.  Neck: Normal range of motion. Neck supple.  Cardiovascular: Normal rate and regular rhythm.   Respiratory: Effort normal. No stridor. No respiratory distress. She has decreased breath sounds in the right  lower field and the left lower field.  GI: She exhibits no distension. There is no tenderness.  Musculoskeletal: She exhibits edema.  Moderate edema BLE with venous stasis changes. Incisions on LE intact  Neurological: She is alert and oriented to person, place, and time.  Verbose. Able to follow basic commands without  difficulty. Mild ataxia LUE.   Skin: Skin is warm and dry.  Psychiatric: She has a normal mood and affect. Her behavior is normal. Judgment and thought content normal.    Motor strength is 5/5 bilateral deltoid, biceps, triceps, grip, hip flexor, knee extensor, ankle dorsiflexor. Sensation in the lower limbs  Results for orders placed or performed during the hospital encounter of 04/16/17 (from the past 24 hour(s))  Glucose, capillary     Status: Abnormal   Collection Time: 04/25/17 10:59 AM  Result Value Ref Range   Glucose-Capillary 180 (H) 65 - 99 mg/dL   Comment 1 Notify RN    Comment 2 Document in Chart   Glucose, capillary     Status: Abnormal   Collection Time: 04/25/17  4:23 PM  Result Value Ref Range   Glucose-Capillary 281 (H) 65 - 99 mg/dL   Comment 1 Notify RN    Comment 2 Document in Chart   Glucose, capillary     Status: Abnormal   Collection Time: 04/25/17 11:52 PM  Result Value Ref Range   Glucose-Capillary 215 (H) 65 - 99 mg/dL  Glucose, capillary     Status: Abnormal   Collection Time: 04/26/17  8:55 AM  Result Value Ref Range   Glucose-Capillary 149 (H) 65 - 99 mg/dL   Comment 1 Notify RN    Comment 2 Document in Chart    No results found.  Assessment/Plan: Diagnosis: Deconditioning after coronary artery bypass grafting 1. Does the need for close, 24 hr/day medical supervision in concert with the patient's rehab needs make it unreasonable for this patient to be served in a less intensive setting? No 2. Co-Morbidities requiring supervision/potential complications: Type 2 diabetes, history of stroke 3. Due to bladder management, bowel management, safety, skin/wound care, disease management, medication administration, pain management and patient education, does the patient require 24 hr/day rehab nursing? No 4. Does the patient require coordinated care of a physician, rehab nurse, PT, OT to address physical and functional deficits in the context of the above medical diagnosis(es)? Potentially Addressing deficits in the following areas: balance, endurance, locomotion, strength, transferring, bathing, dressing and toileting 5. Can  the patient actively participate in an intensive therapy program of at least 3 hrs of therapy per day at least 5 days per week? Yes 6. The potential for patient to make measurable gains while on inpatient rehab is Limited due to high functional status. At current time. 7. Anticipated functional outcomes upon discharge from inpatient rehab are n/a  with PT, n/a with OT, n/a with SLP. 8. Estimated rehab length of stay to reach the above functional goals is: NA 9. Anticipated D/C setting: Home 10. Anticipated post D/C treatments: HH therapy 11. Overall Rehab/Functional Prognosis: excellent  RECOMMENDATIONS: This patient's condition is appropriate for continued rehabilitative care in the following setting: [facility] Patient has agreed to participate in recommended program. Potentially Note that insurance prior authorization may be required for reimbursement for recommended care.  Comment: Patient should get to a supervision level in the next 1-2 days. while on acute care, therefore a separate rehabilitation admission is not necessary. Patient's main concern is that her family will expect to much  out of her. She is concerned about her sternal precautions. She may consider SNF to allow her more time. If she does not wish to go home to a home environment.   Erick Colace M.D. [facility] FAAPM&R (Sports Med, Neuromuscular Med) Diplomate Am Board of Electrodiagnostic Med  Andrea [name], Andrea [name] 04/26/2017

## 2017-04-26 NOTE — Discharge Instructions (Signed)
Endoscopic Saphenous Vein Harvesting, Care After °Refer to this sheet in the next few weeks. These instructions provide you with information about caring for yourself after your procedure. Your health care provider may also give you more specific instructions. Your treatment has been planned according to current medical practices, but problems sometimes occur. Call your health care provider if you have any problems or questions after your procedure. °What can I expect after the procedure? °After the procedure, it is common to have: °· Pain. °· Bruising. °· Swelling. °· Numbness. ° °Follow these instructions at home: °Medicine °· Take over-the-counter and prescription medicines only as told by your health care provider. °· Do not drive or operate heavy machinery while taking prescription pain medicine. °Incision care ° °· Follow instructions from your health care provider about how to take care of the cut made during surgery (incision). Make sure you: °? Wash your hands with soap and water before you change your bandage (dressing). If soap and water are not available, use hand sanitizer. °? Change your dressing as told by your health care provider. °? Leave stitches (sutures), skin glue, or adhesive strips in place. These skin closures may need to be in place for 2 weeks or longer. If adhesive strip edges start to loosen and curl up, you may trim the loose edges. Do not remove adhesive strips completely unless your health care provider tells you to do that. °· Check your incision area every day for signs of infection. Check for: °? More redness, swelling, or pain. °? More fluid or blood. °? Warmth. °? Pus or a bad smell. °General instructions °· Raise (elevate) your legs above the level of your heart while you are sitting or lying down. °· Do any exercises your health care providers have given you. These may include deep breathing, coughing, and walking exercises. °· Do not shower, take baths, swim, or use a hot tub  unless told by your health care provider. °· Wear your elastic stocking if told by your health care provider. °· Keep all follow-up visits as told by your health care provider. This is important. °Contact a health care provider if: °· Medicine does not help your pain. °· Your pain gets worse. °· You have new leg bruises or your leg bruises get bigger. °· You have a fever. °· Your leg feels numb. °· You have more redness, swelling, or pain around your incision. °· You have more fluid or blood coming from your incision. °· Your incision feels warm to the touch. °· You have pus or a bad smell coming from your incision. °Get help right away if: °· Your pain is severe. °· You develop pain, tenderness, warmth, redness, or swelling in any part of your leg. °· You have chest pain. °· You have trouble breathing. °This information is not intended to replace advice given to you by your health care provider. Make sure you discuss any questions you have with your health care provider. °Document Released: 04/06/2011 Document Revised: 12/31/2015 Document Reviewed: 06/08/2015 °Elsevier Interactive Patient Education © 2018 Elsevier Inc. °Coronary Artery Bypass Grafting, Care After ° °This sheet gives you information about how to care for yourself after your procedure. Your health care provider may also give you more specific instructions. If you have problems or questions, contact your health care provider. °What can I expect after the procedure? °After the procedure, it is common to have: °· Nausea and a lack of appetite. °· Constipation. °· Weakness and fatigue. °· Depression or irritability. °·   Pain or discomfort in your incision areas. ° °Follow these instructions at home: °Medicines °· Take over-the-counter and prescription medicines only as told by your health care provider. Do not stop taking medicines or start any new medicines without approval from your health care provider. °· If you were prescribed an antibiotic medicine,  take it as told by your health care provider. Do not stop taking the antibiotic even if you start to feel better. °· Do not drive or use heavy machinery while taking prescription pain medicine. °Incision care °· Follow instructions from your health care provider about how to take care of your incisions. Make sure you: °? Wash your hands with soap and water before you change your bandage (dressing). If soap and water are not available, use hand sanitizer. °? Change your dressing as told by your health care provider. °? Leave stitches (sutures), skin glue, or adhesive strips in place. These skin closures may need to stay in place for 2 weeks or longer. If adhesive strip edges start to loosen and curl up, you may trim the loose edges. Do not remove adhesive strips completely unless your health care provider tells you to do that. °· Keep incision areas clean, dry, and protected. °· Check your incision areas every day for signs of infection. Check for: °? More redness, swelling, or pain. °? More fluid or blood. °? Warmth. °? Pus or a bad smell. °· If incisions were made in your legs: °? Avoid crossing your legs. °? Avoid sitting for long periods of time. Change positions every 30 minutes. °? Raise (elevate) your legs when you are sitting. °Bathing °· Do not take baths, swim, or use a hot tub until your health care provider approves. °· Only take sponge baths. Pat the incisions dry. Do not rub incisions with a washcloth or towel. °· Ask your health care provider when you can shower. °Eating and drinking °· Eat foods that are high in fiber, such as raw fruits and vegetables, whole grains, beans, and nuts. Meats should be lean cut. Avoid canned, processed, and fried foods. This can help prevent constipation and is a recommended part of a heart-healthy diet. °· Drink enough fluid to keep your urine clear or pale yellow. °· Limit alcohol intake to no more than 1 drink a day for nonpregnant women and 2 drinks a day for men.  One drink equals 12 oz of beer, 5 oz of wine, or 1½ oz of hard liquor. °Activity °· Rest and limit your activity as told by your health care provider. You may be instructed to: °? Stop any activity right away if you have chest pain, shortness of breath, irregular heartbeats, or dizziness. Get help right away if you have any of these symptoms. °? Move around frequently for short periods or take short walks as directed by your health care provider. Gradually increase your activities. You may need physical therapy or cardiac rehabilitation to help strengthen your muscles and build your endurance. °? Avoid lifting, pushing, or pulling anything that is heavier than 10 lb (4.5 kg) for at least 6 weeks or as told by your health care provider. °· Do not drive until your health care provider approves. °· Ask your health care provider when you may return to work. °· Ask your health care provider when you may resume sexual activity. °General instructions °· Do not use any products that contain nicotine or tobacco, such as cigarettes and e-cigarettes. If you need help quitting, ask your health care provider. °·   Take 2-3 deep breaths every few hours during the day, while you recover. This helps expand your lungs and prevent complications like pneumonia after surgery. °· If you were given a device called an incentive spirometer, use it several times a day to practice deep breathing. Support your chest with a pillow or your arms when you take deep breaths or cough. °· Wear compression stockings as told by your health care provider. These stockings help to prevent blood clots and reduce swelling in your legs. °· Weigh yourself every day. This helps identify if your body is holding (retaining) fluid that may make your heart and lungs work harder. °· Keep all follow-up visits as told by your health care provider. This is important. °Contact a health care provider if: °· You have more redness, swelling, or pain around any  incision. °· You have more fluid or blood coming from any incision. °· Any incision feels warm to the touch. °· You have pus or a bad smell coming from any incision °· You have a fever. °· You have swelling in your ankles or legs. °· You have pain in your legs. °· You gain 2 lb (0.9 kg) or more a day. °· You are nauseous or you vomit. °· You have diarrhea. °Get help right away if: °· You have chest pain that spreads to your jaw or arms. °· You are short of breath. °· You have a fast or irregular heartbeat. °· You notice a "clicking" in your breastbone (sternum) when you move. °· You have numbness or weakness in your arms or legs. °· You feel dizzy or light-headed. °Summary °· After the procedure, it is common to have pain or discomfort in the incision areas. °· Do not take baths, swim, or use a hot tub until your health care provider approves. °· Gradually increase your activities. You may need physical therapy or cardiac rehabilitation to help strengthen your muscles and build your endurance. °· Weigh yourself every day. This helps identify if your body is holding (retaining) fluid that may make your heart and lungs work harder. °This information is not intended to replace advice given to you by your health care provider. Make sure you discuss any questions you have with your health care provider. °Document Released: 02/11/2005 Document Revised: 06/13/2016 Document Reviewed: 06/13/2016 °Elsevier Interactive Patient Education © 2018 Elsevier Inc. ° °

## 2017-04-26 NOTE — Progress Notes (Addendum)
      [address]       [place] [address]             [phone]      8 Days Post-Op Procedure(s) (LRB): CORONARY ARTERY BYPASS GRAFTING (CABG) times four using left internal mammary artery and right leg saphenous vein (N/A) TRANSESOPHAGEAL ECHOCARDIOGRAM (TEE) (N/A) Subjective: Feels good this morning. Thinking that she wants to go home instead of a facility, but needs to get her family on board with 24 hour care.   Objective: Vital signs in last 24 hours: Temp:  [98 F (36.7 C)-98.5 F (36.9 C)] 98.5 F (36.9 C) (09/18 2230) Pulse Rate:  [72-78] 72 (09/19 0320) Cardiac Rhythm: Normal sinus rhythm (09/19 0700) Resp:  [18-26] 18 (09/19 0320) BP: (120)/(69) 120/69 (09/18 2014) SpO2:  [92 %-93 %] 93 % (09/19 0320) Weight:  [75.5 kg (166 lb 8 oz)] 75.5 kg (166 lb 8 oz) (09/19 0320)  Hemodynamic parameters for last 24 hours:    Intake/Output from previous day: 09/18 0701 - 09/19 0700 In: 600 [P.O.:600] Out: -  Intake/Output this shift: No intake/output data recorded.  General appearance: alert, cooperative and no distress Heart: regular rate and rhythm, S1, S2 normal, no murmur, click, rub or gallop Lungs: diminshed in the lower lobes Abdomen: soft, non-tender; bowel sounds normal; no masses,  no organomegaly Extremities: extremities normal, atraumatic, no cyanosis or edema and 1+ non pitting edema Wound: clean and dry  Lab Results:  Recent Labs  04/24/17 0231 04/25/17 0348  WBC 8.6 9.8  HGB 8.8* 8.6*  HCT 26.8* 26.2*  PLT 217 252   BMET:  Recent Labs  04/24/17 0231 04/25/17 0348  NA 136 137  K 4.5 4.1  CL 103 103  CO2 28 26  GLUCOSE 183* 200*  BUN 18 17  CREATININE 1.21* 1.17*  CALCIUM 8.4* 8.7*    PT/INR: No results for input(s): LABPROT, INR in the last 72 hours. ABG    Component Value Date/Time   PHART 7.507 (H) 04/22/2017 0315   HCO3 30.3 (H) 04/22/2017 0315   TCO2 29 04/21/2017 1639   ACIDBASEDEF 1.0 04/19/2017 2221   O2SAT 53.2 04/22/2017 0450   CBG (last 3)   Recent Labs  04/25/17 1059 04/25/17 1623 04/25/17 2352  GLUCAP 180* 281* 215*    Assessment/Plan: S/P Procedure(s) (LRB): CORONARY ARTERY BYPASS GRAFTING (CABG) times four using left internal mammary artery and right leg saphenous vein (N/A) TRANSESOPHAGEAL ECHOCARDIOGRAM (TEE) (N/A)  1. CV-NSR in the 80s. If BP continues to climb may need to add home BP meds.  2. Pulm-Tolerating room air without issue but occassionally needs 2L [place]. CXR from yesterday showed bibasilar atelectatic changed and small bilateral pleural effusions.  3. Renal-creatinine stable. Electrolytes okay 4. Endo-poorly controlled blood glucose level. Will increase Levemir once again today goal < 150. Continue SSI. Will go back on Janumet on discharge.  5. H and H stable, platelets trending up 6.  PT/OT  Plan: PT recommended 24 hour care and need for post acute rehab. If she does not qualify then will need SNF. Will place a consult for acute rehab here at [facility]. Discontinue EPW. Wean oxygen.   LOS: 10 days    Sharlene Dory 04/26/2017  patient examined and medical record reviewed,agree with above note. Kathlee Nations Trigt III 04/26/2017

## 2017-04-27 ENCOUNTER — Inpatient Hospital Stay (HOSPITAL_COMMUNITY): Payer: Medicare Other

## 2017-04-27 LAB — GLUCOSE, CAPILLARY
GLUCOSE-CAPILLARY: 207 mg/dL — AB (ref 65–99)
Glucose-Capillary: 134 mg/dL — ABNORMAL HIGH (ref 65–99)
Glucose-Capillary: 156 mg/dL — ABNORMAL HIGH (ref 65–99)
Glucose-Capillary: 200 mg/dL — ABNORMAL HIGH (ref 65–99)
Glucose-Capillary: 221 mg/dL — ABNORMAL HIGH (ref 65–99)

## 2017-04-27 MED ORDER — NAPHAZOLINE-GLYCERIN 0.012-0.2 % OP SOLN
1.0000 [drp] | Freq: Four times a day (QID) | OPHTHALMIC | Status: DC | PRN
  Administered 2017-04-27: 2 [drp] via OPHTHALMIC
  Filled 2017-04-27: qty 15

## 2017-04-27 MED ORDER — INSULIN DETEMIR 100 UNIT/ML ~~LOC~~ SOLN
30.0000 [IU] | Freq: Every day | SUBCUTANEOUS | Status: DC
  Administered 2017-04-27: 30 [IU] via SUBCUTANEOUS
  Filled 2017-04-27 (×2): qty 0.3

## 2017-04-27 NOTE — NC FL2 (Signed)
[place] MEDICAID FL2 LEVEL OF CARE SCREENING TOOL     IDENTIFICATION  Patient Name: Andrea [name] Birthdate: [birth year] Sex: female Admission Date (Current Location): 04/16/2017  [facility] and [place] Number:  Producer, television/film/video and Address:  [facility], [address], [place], [place] [address]      Provider Number: [id number]  Attending Physician Name and Address:  Kerin Perna, MD  Relative Name and Phone Number:  Kamiya Acord, [phone]    Current Level of Care: Hospital Recommended Level of Care: Skilled Nursing Facility Prior Approval Number:    Date Approved/Denied:   PASRR Number: [id number]  Discharge Plan: SNF    Current Diagnoses: Patient Active Problem List   Diagnosis Date Noted  . Hx of CABG 04/18/2017  . Non-ST elevation (NSTEMI) myocardial infarction (HCC)   . Unstable angina (HCC) 04/16/2017  . Hyperlipidemia 03/14/2016  . Hypertensive heart disease 12/17/2015  . Diabetes mellitus (HCC) 01/04/2012  . Hypertension   . Stroke (HCC)     Orientation RESPIRATION BLADDER Height & Weight     Self, Time, Situation, Place  Normal Continent Weight: 164 lb 7.4 oz (74.6 kg) Height:   (154.9 cm)  BEHAVIORAL SYMPTOMS/MOOD NEUROLOGICAL BOWEL NUTRITION STATUS      Continent Diet (heart healthy/carb modified )  AMBULATORY STATUS COMMUNICATION OF NEEDS Skin     Verbally Surgical wounds                       Personal Care Assistance Level of Assistance              Functional Limitations Info  Sight, Hearing, Speech Sight Info: Impaired Hearing Info: Adequate Speech Info: Adequate    SPECIAL CARE FACTORS FREQUENCY  PT (By licensed PT), OT (By licensed OT)     PT Frequency: 5x wk OT Frequency: 5x wk            Contractures Contractures Info: Not present    Additional Factors Info  Code Status, Allergies Code Status Info: Full Code Allergies Info: NKA           Current Medications  (04/27/2017):  This is the current hospital active medication list Current Facility-Administered Medications  Medication Dose Route Frequency Provider Last Rate Last Dose  . 0.9 %  sodium chloride infusion  250 mL Intravenous Continuous Conte, Tessa N, PA-C      . aspirin EC tablet 325 mg  325 mg Oral Daily Jari Favre N, [place]   325 mg at 04/27/17 1019   Or  . aspirin chewable tablet 324 mg  324 mg Per Tube Daily Conte, Tessa N, PA-C      . atorvastatin (LIPITOR) tablet 80 mg  80 mg Oral q1800 Conte, Tessa N, PA-C   80 mg at 04/26/17 1750  . bisacodyl (DULCOLAX) EC tablet 10 mg  10 mg Oral Daily Jari Favre N, PA-C   10 mg at 04/27/17 1019   Or  . bisacodyl (DULCOLAX) suppository 10 mg  10 mg Rectal Daily Conte, Tessa N, PA-C      . carvedilol (COREG) tablet 6.25 mg  6.25 mg Oral BID WC Donata Clay, Theron Arista, MD   6.25 mg at 04/27/17 1019  . chlorhexidine (PERIDEX) 0.12 % solution 15 mL  15 mL Mouth Rinse BID Donata Clay, Theron Arista, MD   15 mL at 04/27/17 1019  . docusate sodium (COLACE) capsule 200 mg  200 mg Oral Daily Sharlene Dory,  PA-C   200 mg at 04/27/17 1019  . furosemide (LASIX) injection 40 mg  40 mg Intravenous Daily Donata Clay, Theron Arista, MD   40 mg at 04/27/17 1019  . insulin aspart (novoLOG) injection 0-15 Units  0-15 Units Subcutaneous TID WC Kerin Perna, MD   5 Units at 04/27/17 1226  . insulin aspart (novoLOG) injection 0-5 Units  0-5 Units Subcutaneous QHS Kerin Perna, MD   3 Units at 04/26/17 2246  . insulin detemir (LEVEMIR) injection 30 Units  30 Units Subcutaneous QHS Conte, Tessa N, [place]      . MEDLINE mouth rinse  15 mL Mouth Rinse q12n4p Donata Clay, Theron Arista, MD   15 mL at 04/27/17 1200  . ondansetron (ZOFRAN) injection 4 mg  4 mg Intravenous Q6H PRN Asa Lente, Tessa N, PA-C      . oxyCODONE (Oxy IR/ROXICODONE) immediate release tablet 5 mg  5 mg Oral Q3H PRN Donata Clay, Theron Arista, MD      . pantoprazole (PROTONIX) EC tablet 40 mg  40 mg Oral Daily Conte, Tessa N, PA-C   40 mg at  04/27/17 1019  . sodium chloride flush (NS) 0.9 % injection 3 mL  3 mL Intravenous Q12H Conte, Tessa N, PA-C   3 mL at 04/27/17 1000  . sodium chloride flush (NS) 0.9 % injection 3 mL  3 mL Intravenous PRN Conte, Tessa N, PA-C      . traMADol Janean Sark) tablet 50-100 mg  50-100 mg Oral Q4H PRN Asa Lente, Tessa N, PA-C   100 mg at 04/27/17 4010     Discharge Medications: Please see discharge summary for a list of discharge medications.  Relevant Imaging Results:  Relevant Lab Results:   Additional Information SS#[id number]  Althea Charon, LCSW

## 2017-04-27 NOTE — Progress Notes (Signed)
Patient ambulated in hallway x1 assist with nursing staff and walker. Will monitor patient. Luismiguel Lamere, Randall An rN

## 2017-04-27 NOTE — Progress Notes (Signed)
Pt family picked [facility] for SNF.

## 2017-04-27 NOTE — Progress Notes (Signed)
CARDIAC REHAB PHASE I   PRE:  Rate/Rhythm: 77 SR  BP:  Supine: 130/66  Sitting:   Standing:    SaO2: 95% 1L  MODE:  Ambulation: 470 ft   POST:  Rate/Rhythm: 86 SR  BP:  Supine: 131/75  Sitting:   Standing:    SaO2: 94%RA 1340-1418 Pt walked 470 ft on RA with rolling walker with steady gait. Left off oxygen. To bed with legs on 2 pillows due to swelling.  Tolerated walk well.   Luetta Nutting, RN BSN  04/27/2017 2:13 PM

## 2017-04-27 NOTE — Progress Notes (Signed)
Rehab admissions - Please see rehab consult done by Dr. Letta Pate yesterday.  I met briefly with patient and SW today.  Plans are to consider SNF placement.  Call me for questions.  #[phone]

## 2017-04-27 NOTE — Progress Notes (Addendum)
      [address]       [place] [address]             [phone]      9 Days Post-Op Procedure(s) (LRB): CORONARY ARTERY BYPASS GRAFTING (CABG) times four using left internal mammary artery and right leg saphenous vein (N/A) TRANSESOPHAGEAL ECHOCARDIOGRAM (TEE) (N/A) Subjective: No issues this morning. Eating her breakfast.   Objective: Vital signs in last 24 hours: Temp:  [98.3 F (36.8 C)-98.6 F (37 C)] 98.6 F (37 C) (09/20 0600) Pulse Rate:  [73-74] 73 (09/20 0400) Cardiac Rhythm: Normal sinus rhythm (09/20 0752) Resp:  [17-28] 17 (09/20 0400) BP: (112)/(58) 112/58 (09/19 2045) SpO2:  [91 %-93 %] 93 % (09/20 0400) Weight:  [74.6 kg (164 lb 7.4 oz)] 74.6 kg (164 lb 7.4 oz) (09/20 0228)  Hemodynamic parameters for last 24 hours:    Intake/Output from previous day: 09/19 0701 - 09/20 0700 In: 720 [P.O.:720] Out: 400 [Urine:400] Intake/Output this shift: No intake/output data recorded.  General appearance: alert, cooperative and no distress Heart: regular rate and rhythm, S1, S2 normal, no murmur, click, rub or gallop Lungs: clear to auscultation bilaterally Abdomen: soft, non-tender; bowel sounds normal; no masses,  no organomegaly Extremities: 1+ nonpitting edema Wound: clean and dry  Lab Results:  Recent Labs  04/25/17 0348  WBC 9.8  HGB 8.6*  HCT 26.2*  PLT 252   BMET:  Recent Labs  04/25/17 0348  NA 137  K 4.1  CL 103  CO2 26  GLUCOSE 200*  BUN 17  CREATININE 1.17*  CALCIUM 8.7*    PT/INR: No results for input(s): LABPROT, INR in the last 72 hours. ABG    Component Value Date/Time   PHART 7.507 (H) 04/22/2017 0315   HCO3 30.3 (H) 04/22/2017 0315   TCO2 29 04/21/2017 1639   ACIDBASEDEF 1.0 04/19/2017 2221   O2SAT 53.2 04/22/2017 0450   CBG (last 3)   Recent Labs  04/26/17 1651 04/27/17 0011 04/27/17 0558  GLUCAP 205* 221* 156*    Assessment/Plan: S/P Procedure(s) (LRB): CORONARY ARTERY BYPASS GRAFTING  (CABG) times four using left internal mammary artery and right leg saphenous vein (N/A) TRANSESOPHAGEAL ECHOCARDIOGRAM (TEE) (N/A)  1. CV-NSR in the 80s. BP well controlled on current regimen. EPW out 2. Pulm-Tolerating room air without issue but occassionally needs 2L [place]. Wean as tolerated.  3. Renal-creatinine stable. Electrolytes okay 4. Endo-poorly controlled blood glucose level. Will increase Levemir once again today goal < 150. Continue SSI. Will go back on Janumet on discharge.  5. H and H stable, platelets trending up 6. PT/OT  Plan: Not a candidate for inpatient rehab. Home health recommended vs. SNF. Patient to talk to her family and discuss options. Patient shares she would really like to go to inpatient rehab so she can not have to worry about responsibilities at home.    LOS: 11 days    Sharlene Dory 04/27/2017  patient examined and medical record reviewed,agree with above note. DC planning in progress Kathlee Nations Trigt III 04/27/2017

## 2017-04-28 ENCOUNTER — Telehealth: Payer: Self-pay | Admitting: *Deleted

## 2017-04-28 MED ORDER — ATORVASTATIN CALCIUM 80 MG PO TABS: 80.0000 mg | ORAL_TABLET | Freq: Every day | ORAL | 1 refills | Status: DC

## 2017-04-28 MED ORDER — TRAMADOL HCL 50 MG PO TABS: 100.0000 mg | ORAL_TABLET | Freq: Four times a day (QID) | ORAL | 1 refills | Status: DC | PRN

## 2017-04-28 MED ORDER — FUROSEMIDE 40 MG PO TABS: 40.0000 mg | ORAL_TABLET | Freq: Every day | ORAL | 0 refills | Status: DC

## 2017-04-28 MED ORDER — ASPIRIN 325 MG PO TBEC: 325.0000 mg | DELAYED_RELEASE_TABLET | Freq: Every day | ORAL | 0 refills | Status: DC

## 2017-04-28 MED ORDER — NAPHAZOLINE-GLYCERIN 0.012-0.2 % OP SOLN: 1.0000 [drp] | Freq: Four times a day (QID) | OPHTHALMIC | 0 refills | Status: DC | PRN

## 2017-04-28 MED ORDER — CARVEDILOL 6.25 MG PO TABS: 6.2500 mg | ORAL_TABLET | Freq: Two times a day (BID) | ORAL | 1 refills | Status: DC

## 2017-04-28 MED ORDER — POTASSIUM CHLORIDE CRYS ER 20 MEQ PO TBCR
20.0000 meq | EXTENDED_RELEASE_TABLET | Freq: Every day | ORAL | 0 refills | Status: DC
Start: 2017-04-28 — End: 2017-05-08

## 2017-04-28 NOTE — Care Management Important Message (Signed)
Important Message  Patient Details  Name: Andrea [name] MRN: [id number] Date of Birth: July 21, 1946   Medicare Important Message Given:  Yes    Kyla Balzarine 04/28/2017, 10:11 AM

## 2017-04-28 NOTE — Progress Notes (Signed)
Clinical Social Worker facilitated patient discharge including contacting patient family and facility to confirm patient discharge plans.  Clinical information faxed to facility and family agreeable with plan.  CSW arranged ambulance transport via [facility] to [facility] .  RN Dois Davenport to call RN Director Misty Stanley at [phone] (pt will go in rm# [id number]) for report prior to discharge.  Clinical Social Worker will sign off for now as social work intervention is no longer needed. Please consult [place] again if new need arises.  Marrianne Mood, MSW, [employer] [phone]

## 2017-04-28 NOTE — Progress Notes (Signed)
CARDIAC REHAB PHASE I   PRE:  Rate/Rhythm: 83 SR    BP: sitting 116/75    SaO2: 96 RA  MODE:  Ambulation: 470 ft   POST:  Rate/Rhythm: 91 SR    BP: sitting 134/71     SaO2: 93 RA  Pt doing well, independent in room. Used RW, steady, slow pace. 250 ft in 6 min. SaO2 93 RA. Spot checked many times and never below 91 RA. She does not need O2. To recliner after she brushed teeth. Ed completed with pt. Voiced understanding. I will send referral to [facility].  [phone]   Harriet Masson CES, ACSM 04/28/2017 10:48 AM

## 2017-04-28 NOTE — Telephone Encounter (Signed)
-----   Message from Julio Sicks, LPN sent at 1/61/0960  5:55 PM EDT ----- Regarding: FW: Post Op OV Pt still currently admitted.  Can someone try to reach out to her please once she is discharged?  I am on vacation all next week.  Pt can have:  10/5 @ 2pm 10/11 @ 10:40A or 11:40A 10/12 @ 11:40A  Thanks so much! Lilyan Gilford  ----- Message ----- From: Lyn Records, MD Sent: 04/26/2017  12:42 PM To: Julio Sicks, LPN Subject: Post Op Andrea [name] Nurse please squeeze Mrs. Andrea [name] in to see me for a 2 to 3 -week office visit to follow-up post bypass surgery.  HS ----- Message ----- From: Rowe Clack, PA-C Sent: 04/26/2017  10:00 AM To: Lyn Records, MD  Please schedule a two-week follow-up cardiology appointment. She had a CABG times 4 on 04/18/2017 by PVT. Referring cardiologist: Garnette Scheuermann M.D.  Thank you

## 2017-04-28 NOTE — Progress Notes (Signed)
      [address]       [place] [address]             [phone]      10 Days Post-Op Procedure(s) (LRB): CORONARY ARTERY BYPASS GRAFTING (CABG) times four using left internal mammary artery and right leg saphenous vein (N/A) TRANSESOPHAGEAL ECHOCARDIOGRAM (TEE) (N/A) Subjective: No issues overnight. Feels okay.   Objective: Vital signs in last 24 hours: Temp:  [98 F (36.7 C)-98.1 F (36.7 C)] 98.1 F (36.7 C) (09/21 0549) Pulse Rate:  [79-90] 86 (09/21 0549) Cardiac Rhythm: Normal sinus rhythm (09/20 2000) Resp:  [22] 22 (09/20 1420) BP: (118-130)/(66-73) 118/66 (09/21 0549) SpO2:  [93 %-94 %] 93 % (09/21 0549) Weight:  [73.8 kg (162 lb 12.8 oz)] 73.8 kg (162 lb 12.8 oz) (09/21 0549)      Intake/Output from previous day: 09/20 0701 - 09/21 0700 In: -  Out: 500 [Urine:500] Intake/Output this shift: No intake/output data recorded.  General appearance: alert, cooperative and no distress Heart: regular rate and rhythm, S1, S2 normal, no murmur, click, rub or gallop Lungs: clear to auscultation bilaterally Abdomen: soft, non-tender; bowel sounds normal; no masses,  no organomegaly Extremities: 1+ non-pitting pedal edema Wound: clean and dry  Lab Results: No results for input(s): WBC, HGB, HCT, PLT in the last 72 hours. BMET: No results for input(s): NA, K, CL, CO2, GLUCOSE, BUN, CREATININE, CALCIUM in the last 72 hours.  PT/INR: No results for input(s): LABPROT, INR in the last 72 hours. ABG    Component Value Date/Time   PHART 7.507 (H) 04/22/2017 0315   HCO3 30.3 (H) 04/22/2017 0315   TCO2 29 04/21/2017 1639   ACIDBASEDEF 1.0 04/19/2017 2221   O2SAT 53.2 04/22/2017 0450   CBG (last 3)   Recent Labs  04/27/17 1113 04/27/17 1709 04/27/17 2105  GLUCAP 207* 134* 200*    Assessment/Plan: S/P Procedure(s) (LRB): CORONARY ARTERY BYPASS GRAFTING (CABG) times four using left internal mammary artery and right leg saphenous vein  (N/A) TRANSESOPHAGEAL ECHOCARDIOGRAM (TEE) (N/A)  1. CV-NSR in the 80s. BP well controlled on current regimen. EPW out 2. Pulm-Tolerating  2L [place] without issue. CXR from yesterday showed: Mild degree of hypoinflation with improvement left base opacification and stable mild right base opacification likely small effusions with atelectasis. Cannot completely exclude infection in the lung bases.Wean as tolerated.  3. Renal-creatinine stable. Electrolytes okay 4. Endo-CBGs remains around 200. Will increase Levemir once again today goal < 150. Continue SSI. Will go back on Janumet and home dose Levemir on discharge.  5. H and H stable, platelets trending up 6. PT/OT  Plan: Patient and family decided on [facility] for SNF. Will order a 6 minute walk test but will likely require oxygen on discharge.    LOS: 12 days    Sharlene Dory 04/28/2017

## 2017-04-28 NOTE — Progress Notes (Signed)
Called PA Tessa to ask if patient was ready for discharge.  Oxygen is not needed for discharge.  If documentation in [facility], [facility] will work on discharging patient. Pt resting with call bell within reach.  Will continue to monitor. Thomas Hoff, RN

## 2017-04-28 NOTE — Telephone Encounter (Signed)
Patient scheduled for 05/12/17 @ 2:00 pm with Dr. Katrinka Blazing. I spoke with Rosann Auerbach- [employer] to let her know prior to patient's discharge as it looks like she is going to [facility]. Per Rosann Auerbach, she will get this in the patient's chart.

## 2017-04-28 NOTE — Progress Notes (Signed)
Pt/family given discharge instructions, medication lists, follow up appointments, and when to call the doctor.  Pt/family verbalizes understanding. Called [facility] and gave report to Clementon.  All questions answered. Pt resting with call bell within reach.  Will continue to monitor. Thomas Hoff, RN

## 2017-04-30 ENCOUNTER — Inpatient Hospital Stay (HOSPITAL_COMMUNITY): Payer: Medicare Other

## 2017-04-30 ENCOUNTER — Emergency Department (HOSPITAL_COMMUNITY): Admit: 2017-04-30 | Discharge: 2017-04-30 | Disposition: A | Discharge status: Home / Self Care | Payer: Medicare Other

## 2017-04-30 ENCOUNTER — Encounter (HOSPITAL_COMMUNITY): Payer: Self-pay

## 2017-04-30 ENCOUNTER — Inpatient Hospital Stay (HOSPITAL_COMMUNITY)
Admission: EM | Admit: 2017-04-30 | Discharge: 2017-05-05 | DRG: 812 | Disposition: A | Discharge status: Home Health | Payer: Medicare Other | Attending: Internal Medicine | Admitting: Internal Medicine

## 2017-04-30 ENCOUNTER — Emergency Department (HOSPITAL_COMMUNITY): Payer: Medicare Other

## 2017-04-30 DIAGNOSIS — I252 Old myocardial infarction: Secondary | ICD-10-CM

## 2017-04-30 DIAGNOSIS — R5084 Febrile nonhemolytic transfusion reaction: Secondary | ICD-10-CM | POA: Diagnosis present

## 2017-04-30 DIAGNOSIS — D509 Iron deficiency anemia, unspecified: Secondary | ICD-10-CM | POA: Diagnosis present

## 2017-04-30 DIAGNOSIS — I2510 Atherosclerotic heart disease of native coronary artery without angina pectoris: Secondary | ICD-10-CM | POA: Diagnosis present

## 2017-04-30 DIAGNOSIS — I2583 Coronary atherosclerosis due to lipid rich plaque: Secondary | ICD-10-CM | POA: Diagnosis not present

## 2017-04-30 DIAGNOSIS — E860 Dehydration: Secondary | ICD-10-CM | POA: Diagnosis present

## 2017-04-30 DIAGNOSIS — E119 Type 2 diabetes mellitus without complications: Secondary | ICD-10-CM | POA: Diagnosis present

## 2017-04-30 DIAGNOSIS — R509 Fever, unspecified: Secondary | ICD-10-CM

## 2017-04-30 DIAGNOSIS — Z87891 Personal history of nicotine dependence: Secondary | ICD-10-CM

## 2017-04-30 DIAGNOSIS — D649 Anemia, unspecified: Secondary | ICD-10-CM

## 2017-04-30 DIAGNOSIS — R6510 Systemic inflammatory response syndrome (SIRS) of non-infectious origin without acute organ dysfunction: Secondary | ICD-10-CM | POA: Diagnosis present

## 2017-04-30 DIAGNOSIS — Z951 Presence of aortocoronary bypass graft: Secondary | ICD-10-CM | POA: Diagnosis not present

## 2017-04-30 DIAGNOSIS — E871 Hypo-osmolality and hyponatremia: Secondary | ICD-10-CM | POA: Diagnosis not present

## 2017-04-30 DIAGNOSIS — Z8673 Personal history of transient ischemic attack (TIA), and cerebral infarction without residual deficits: Secondary | ICD-10-CM

## 2017-04-30 DIAGNOSIS — Z794 Long term (current) use of insulin: Secondary | ICD-10-CM

## 2017-04-30 DIAGNOSIS — H3530 Unspecified macular degeneration: Secondary | ICD-10-CM | POA: Diagnosis present

## 2017-04-30 DIAGNOSIS — L8990 Pressure ulcer of unspecified site, unspecified stage: Secondary | ICD-10-CM | POA: Diagnosis present

## 2017-04-30 DIAGNOSIS — Z7982 Long term (current) use of aspirin: Secondary | ICD-10-CM | POA: Diagnosis not present

## 2017-04-30 DIAGNOSIS — R5381 Other malaise: Secondary | ICD-10-CM | POA: Diagnosis not present

## 2017-04-30 DIAGNOSIS — I10 Essential (primary) hypertension: Secondary | ICD-10-CM | POA: Diagnosis present

## 2017-04-30 DIAGNOSIS — I5032 Chronic diastolic (congestive) heart failure: Secondary | ICD-10-CM | POA: Diagnosis present

## 2017-04-30 DIAGNOSIS — M7989 Other specified soft tissue disorders: Secondary | ICD-10-CM | POA: Diagnosis not present

## 2017-04-30 DIAGNOSIS — A419 Sepsis, unspecified organism: Secondary | ICD-10-CM

## 2017-04-30 DIAGNOSIS — I25709 Atherosclerosis of coronary artery bypass graft(s), unspecified, with unspecified angina pectoris: Secondary | ICD-10-CM | POA: Diagnosis present

## 2017-04-30 DIAGNOSIS — E876 Hypokalemia: Secondary | ICD-10-CM | POA: Diagnosis present

## 2017-04-30 DIAGNOSIS — N179 Acute kidney failure, unspecified: Secondary | ICD-10-CM | POA: Diagnosis present

## 2017-04-30 DIAGNOSIS — Z79899 Other long term (current) drug therapy: Secondary | ICD-10-CM | POA: Diagnosis not present

## 2017-04-30 DIAGNOSIS — R1030 Lower abdominal pain, unspecified: Secondary | ICD-10-CM | POA: Diagnosis not present

## 2017-04-30 DIAGNOSIS — I110 Hypertensive heart disease with heart failure: Secondary | ICD-10-CM | POA: Diagnosis present

## 2017-04-30 DIAGNOSIS — R17 Unspecified jaundice: Secondary | ICD-10-CM | POA: Diagnosis present

## 2017-04-30 DIAGNOSIS — T80911A Delayed hemolytic transfusion reaction, unspecified incompatibility, initial encounter: Principal | ICD-10-CM | POA: Diagnosis present

## 2017-04-30 DIAGNOSIS — I340 Nonrheumatic mitral (valve) insufficiency: Secondary | ICD-10-CM | POA: Diagnosis not present

## 2017-04-30 HISTORY — DX: Hypokalemia: E87.6

## 2017-04-30 HISTORY — DX: Anemia, unspecified: D64.9

## 2017-04-30 HISTORY — DX: Heart failure, unspecified: I50.9

## 2017-04-30 LAB — COMPREHENSIVE METABOLIC PANEL
ALBUMIN: 2.6 g/dL — AB (ref 3.5–5.0)
ALT: 40 U/L (ref 14–54)
AST: 90 U/L — AB (ref 15–41)
Alkaline Phosphatase: 72 U/L (ref 38–126)
Anion gap: 10 (ref 5–15)
BUN: 26 mg/dL — AB (ref 6–20)
CHLORIDE: 102 mmol/L (ref 101–111)
CO2: 22 mmol/L (ref 22–32)
CREATININE: 1.43 mg/dL — AB (ref 0.44–1.00)
Calcium: 8.6 mg/dL — ABNORMAL LOW (ref 8.9–10.3)
GFR calc Af Amer: 42 mL/min — ABNORMAL LOW (ref >60)
GFR calc non Af Amer: 36 mL/min — ABNORMAL LOW (ref >60)
Glucose, Bld: 93 mg/dL (ref 65–99)
Potassium: 3.3 mmol/L — ABNORMAL LOW (ref 3.5–5.1)
SODIUM: 134 mmol/L — AB (ref 135–145)
Total Bilirubin: 3.9 mg/dL — ABNORMAL HIGH (ref 0.3–1.2)
Total Protein: 6.4 g/dL — ABNORMAL LOW (ref 6.5–8.1)

## 2017-04-30 LAB — CBC WITH DIFFERENTIAL/PLATELET
BASOS PCT: 1 %
Basophils Absolute: 0.1 10*3/uL (ref 0.0–0.1)
EOS PCT: 0 %
Eosinophils Absolute: 0 10*3/uL (ref 0.0–0.7)
HEMATOCRIT: 22.6 % — AB (ref 36.0–46.0)
Hemoglobin: 7.7 g/dL — ABNORMAL LOW (ref 12.0–15.0)
LYMPHS ABS: 1.5 10*3/uL (ref 0.7–4.0)
Lymphocytes Relative: 12 %
MCH: 27.9 pg (ref 26.0–34.0)
MCHC: 34.1 g/dL (ref 30.0–36.0)
MCV: 81.9 fL (ref 78.0–100.0)
MONO ABS: 1 10*3/uL (ref 0.1–1.0)
MONOS PCT: 8 %
Neutro Abs: 10.1 10*3/uL — ABNORMAL HIGH (ref 1.7–7.7)
Neutrophils Relative %: 79 %
PLATELETS: 200 10*3/uL (ref 150–400)
RBC: 2.76 MIL/uL — ABNORMAL LOW (ref 3.87–5.11)
RDW: 16.1 % — ABNORMAL HIGH (ref 11.5–15.5)
WBC: 12.7 10*3/uL — ABNORMAL HIGH (ref 4.0–10.5)

## 2017-04-30 LAB — URINALYSIS, ROUTINE W REFLEX MICROSCOPIC
BILIRUBIN URINE: NEGATIVE
Glucose, UA: NEGATIVE mg/dL
KETONES UR: NEGATIVE mg/dL
Leukocytes, UA: NEGATIVE
Nitrite: NEGATIVE
PROTEIN: 100 mg/dL — AB
Specific Gravity, Urine: 1.011 (ref 1.005–1.030)
pH: 5 (ref 5.0–8.0)

## 2017-04-30 LAB — BILIRUBIN, FRACTIONATED(TOT/DIR/INDIR)
BILIRUBIN DIRECT: 0.9 mg/dL — AB (ref 0.1–0.5)
Indirect Bilirubin: 2.9 mg/dL — ABNORMAL HIGH (ref 0.3–0.9)
Total Bilirubin: 3.8 mg/dL — ABNORMAL HIGH (ref 0.3–1.2)

## 2017-04-30 LAB — IRON AND TIBC
Iron: 55 ug/dL (ref 28–170)
Saturation Ratios: 21 % (ref 10.4–31.8)
TIBC: 256 ug/dL (ref 250–450)
UIBC: 201 ug/dL

## 2017-04-30 LAB — GLUCOSE, CAPILLARY
GLUCOSE-CAPILLARY: 77 mg/dL (ref 65–99)
GLUCOSE-CAPILLARY: 82 mg/dL (ref 65–99)
Glucose-Capillary: 111 mg/dL — ABNORMAL HIGH (ref 65–99)

## 2017-04-30 LAB — RETICULOCYTES
RBC.: 2.64 MIL/uL — AB (ref 3.87–5.11)
RETIC CT PCT: 5.5 % — AB (ref 0.4–3.1)
Retic Count, Absolute: 145.2 10*3/uL (ref 19.0–186.0)

## 2017-04-30 LAB — PROCALCITONIN: PROCALCITONIN: 0.51 ng/mL

## 2017-04-30 LAB — FERRITIN: FERRITIN: 2091 ng/mL — AB (ref 11–307)

## 2017-04-30 LAB — LACTATE DEHYDROGENASE: LDH: 994 U/L — ABNORMAL HIGH (ref 98–192)

## 2017-04-30 LAB — FOLATE: Folate: 23.7 ng/mL (ref >5.9)

## 2017-04-30 LAB — I-STAT CG4 LACTIC ACID, ED: Lactic Acid, Venous: 1.02 mmol/L (ref 0.5–1.9)

## 2017-04-30 LAB — VITAMIN B12: VITAMIN B 12: 1521 pg/mL — AB (ref 180–914)

## 2017-04-30 MED ORDER — POTASSIUM CHLORIDE CRYS ER 20 MEQ PO TBCR
20.0000 meq | EXTENDED_RELEASE_TABLET | Freq: Two times a day (BID) | ORAL | Status: DC
  Administered 2017-05-01: 20 meq via ORAL
  Filled 2017-04-30: qty 1

## 2017-04-30 MED ORDER — INSULIN ASPART 100 UNIT/ML ~~LOC~~ SOLN
0.0000 [IU] | Freq: Every day | SUBCUTANEOUS | Status: DC
  Administered 2017-05-01: 2 [IU] via SUBCUTANEOUS
  Administered 2017-05-02 – 2017-05-03 (×2): 3 [IU] via SUBCUTANEOUS
  Administered 2017-05-04: 2 [IU] via SUBCUTANEOUS

## 2017-04-30 MED ORDER — BISACODYL 5 MG PO TBEC
5.0000 mg | DELAYED_RELEASE_TABLET | Freq: Every day | ORAL | Status: DC | PRN
  Filled 2017-04-30: qty 1

## 2017-04-30 MED ORDER — CARVEDILOL 6.25 MG PO TABS
6.2500 mg | ORAL_TABLET | Freq: Two times a day (BID) | ORAL | Status: DC
  Administered 2017-05-01 – 2017-05-05 (×9): 6.25 mg via ORAL
  Filled 2017-04-30 (×9): qty 1

## 2017-04-30 MED ORDER — SODIUM CHLORIDE 0.9% FLUSH: 3.0000 mL | INTRAVENOUS | Status: DC | PRN

## 2017-04-30 MED ORDER — DARIFENACIN HYDROBROMIDE ER 15 MG PO TB24
15.0000 mg | ORAL_TABLET | Freq: Every day | ORAL | Status: DC
  Administered 2017-05-01 – 2017-05-05 (×5): 15 mg via ORAL
  Filled 2017-04-30 (×5): qty 1

## 2017-04-30 MED ORDER — TRAMADOL HCL 50 MG PO TABS
100.0000 mg | ORAL_TABLET | Freq: Four times a day (QID) | ORAL | Status: DC | PRN
Start: 2017-04-30 — End: 2017-05-05
  Administered 2017-04-30 – 2017-05-05 (×3): 100 mg via ORAL
  Filled 2017-04-30 (×4): qty 2

## 2017-04-30 MED ORDER — ACETAMINOPHEN 500 MG PO TABS
1000.0000 mg | ORAL_TABLET | Freq: Once | ORAL | Status: AC
  Administered 2017-04-30: 1000 mg via ORAL
  Filled 2017-04-30: qty 2

## 2017-04-30 MED ORDER — VANCOMYCIN HCL 10 G IV SOLR
1500.0000 mg | Freq: Once | INTRAVENOUS | Status: AC
  Administered 2017-04-30: 1500 mg via INTRAVENOUS
  Filled 2017-04-30: qty 1500

## 2017-04-30 MED ORDER — ONDANSETRON HCL 4 MG/2ML IJ SOLN: 4.0000 mg | Freq: Four times a day (QID) | INTRAMUSCULAR | Status: DC | PRN

## 2017-04-30 MED ORDER — ATORVASTATIN CALCIUM 80 MG PO TABS
80.0000 mg | ORAL_TABLET | Freq: Every day | ORAL | Status: DC
  Administered 2017-05-01 – 2017-05-04 (×4): 80 mg via ORAL
  Filled 2017-04-30 (×4): qty 1

## 2017-04-30 MED ORDER — ONDANSETRON HCL 4 MG PO TABS: 4.0000 mg | ORAL_TABLET | Freq: Four times a day (QID) | ORAL | Status: DC | PRN

## 2017-04-30 MED ORDER — SODIUM CHLORIDE 0.9 % IV SOLN: Freq: Once | INTRAVENOUS | Status: DC

## 2017-04-30 MED ORDER — INSULIN DETEMIR 100 UNIT/ML ~~LOC~~ SOLN
25.0000 [IU] | Freq: Every day | SUBCUTANEOUS | Status: DC
  Administered 2017-05-01 – 2017-05-04 (×4): 25 [IU] via SUBCUTANEOUS
  Filled 2017-04-30 (×6): qty 0.25

## 2017-04-30 MED ORDER — ASPIRIN EC 325 MG PO TBEC
325.0000 mg | DELAYED_RELEASE_TABLET | Freq: Every day | ORAL | Status: DC
  Administered 2017-05-01 – 2017-05-05 (×5): 325 mg via ORAL
  Filled 2017-04-30 (×5): qty 1

## 2017-04-30 MED ORDER — SODIUM CHLORIDE 0.9 % IV BOLUS (SEPSIS)
500.0000 mL | Freq: Once | INTRAVENOUS | Status: AC
  Administered 2017-04-30: 500 mL via INTRAVENOUS

## 2017-04-30 MED ORDER — PIPERACILLIN-TAZOBACTAM 3.375 G IVPB
3.3750 g | Freq: Three times a day (TID) | INTRAVENOUS | Status: DC
  Administered 2017-04-30 – 2017-05-04 (×10): 3.375 g via INTRAVENOUS
  Filled 2017-04-30 (×15): qty 50

## 2017-04-30 MED ORDER — HYDROCODONE-ACETAMINOPHEN 5-325 MG PO TABS
1.0000 | ORAL_TABLET | ORAL | Status: DC | PRN
  Administered 2017-05-02: 1 via ORAL
  Administered 2017-05-02 – 2017-05-04 (×4): 2 via ORAL
  Filled 2017-04-30 (×5): qty 2

## 2017-04-30 MED ORDER — SENNOSIDES-DOCUSATE SODIUM 8.6-50 MG PO TABS
1.0000 | ORAL_TABLET | Freq: Every evening | ORAL | Status: DC | PRN
  Administered 2017-05-02: 1 via ORAL
  Filled 2017-04-30 (×2): qty 1

## 2017-04-30 MED ORDER — VANCOMYCIN HCL IN DEXTROSE 1-5 GM/200ML-% IV SOLN: 1000.0000 mg | Freq: Once | INTRAVENOUS | Status: DC

## 2017-04-30 MED ORDER — SODIUM CHLORIDE 0.9 % IV SOLN: 250.0000 mL | INTRAVENOUS | Status: DC | PRN

## 2017-04-30 MED ORDER — VANCOMYCIN HCL IN DEXTROSE 1-5 GM/200ML-% IV SOLN: 1000.0000 mg | INTRAVENOUS | Status: DC

## 2017-04-30 MED ORDER — INSULIN ASPART 100 UNIT/ML ~~LOC~~ SOLN
2.0000 [IU] | Freq: Three times a day (TID) | SUBCUTANEOUS | Status: DC
  Administered 2017-05-01 – 2017-05-05 (×14): 2 [IU] via SUBCUTANEOUS

## 2017-04-30 MED ORDER — SODIUM CHLORIDE 0.9% FLUSH
3.0000 mL | Freq: Two times a day (BID) | INTRAVENOUS | Status: DC
  Administered 2017-04-30 – 2017-05-05 (×6): 3 mL via INTRAVENOUS

## 2017-04-30 MED ORDER — IOPAMIDOL (ISOVUE-300) INJECTION 61%
INTRAVENOUS | Status: AC
  Administered 2017-04-30: 80 mL
  Filled 2017-04-30: qty 100

## 2017-04-30 MED ORDER — SODIUM CHLORIDE 0.9 % IV SOLN
Freq: Once | INTRAVENOUS | Status: AC
  Administered 2017-04-30: 17:00:00 via INTRAVENOUS

## 2017-04-30 MED ORDER — PIPERACILLIN-TAZOBACTAM 3.375 G IVPB 30 MIN
3.3750 g | Freq: Once | INTRAVENOUS | Status: AC
  Administered 2017-04-30: 3.375 g via INTRAVENOUS
  Filled 2017-04-30: qty 50

## 2017-04-30 MED ORDER — POTASSIUM CHLORIDE CRYS ER 20 MEQ PO TBCR
40.0000 meq | EXTENDED_RELEASE_TABLET | Freq: Once | ORAL | Status: AC
  Administered 2017-04-30: 40 meq via ORAL
  Filled 2017-04-30: qty 2

## 2017-04-30 MED ORDER — ACETAMINOPHEN 650 MG RE SUPP
650.0000 mg | Freq: Four times a day (QID) | RECTAL | Status: DC | PRN
  Filled 2017-04-30: qty 1

## 2017-04-30 MED ORDER — INSULIN ASPART 100 UNIT/ML ~~LOC~~ SOLN
0.0000 [IU] | Freq: Three times a day (TID) | SUBCUTANEOUS | Status: DC
  Administered 2017-05-01: 1 [IU] via SUBCUTANEOUS
  Administered 2017-05-01: 2 [IU] via SUBCUTANEOUS

## 2017-04-30 MED ORDER — ACETAMINOPHEN 325 MG PO TABS: 650.0000 mg | ORAL_TABLET | Freq: Four times a day (QID) | ORAL | Status: DC | PRN

## 2017-04-30 MED ORDER — VITAMIN D 1000 UNITS PO TABS
2000.0000 [IU] | ORAL_TABLET | Freq: Every day | ORAL | Status: DC
  Administered 2017-05-01 – 2017-05-05 (×5): 2000 [IU] via ORAL
  Filled 2017-04-30 (×5): qty 2

## 2017-04-30 MED ORDER — FUROSEMIDE 40 MG PO TABS
40.0000 mg | ORAL_TABLET | Freq: Every day | ORAL | Status: DC
  Administered 2017-05-01: 40 mg via ORAL
  Filled 2017-04-30: qty 1

## 2017-04-30 NOTE — ED Provider Notes (Signed)
[facility]-EMERGENCY DEPT Provider Note   CSN: [id number] Arrival date & time: 04/30/17  1436     History   Chief Complaint Chief Complaint  Patient presents with  . Fever    HPI Andrea [name] is a 71 y.o. female.  HPI   71 year old female with past medical history of hypertension, recent N STEMI status post CABG who presents with fever. Patient states that for the last 2 days, she has felt generally fatigued. She reportedly had a fever to 100.2 yesterday and Tylenol which improved it. She was otherwise well throughout the day and had normal appetite and energy level. Upon awakening today, the patient was reportedly confused. She had a fever up to 101.2. She states that she feels generally drained and has poor appetite and fatigue. She does note some mild suprapubic pain. She did not have a bowel movement today but denies any other abdominal pain and has been moving her bowels regularly. She states she has some mild lower back pain due to's laying in bed all day. She also has mild shortness of breath, that is worsened but intermittent since her surgery. Denies any pain or redness at her surgical sites. Denies any flank pain. No nausea or vomiting.  Past Medical History:  Diagnosis Date  . Diabetes mellitus   . Hypertension   . Macular degeneration disease    loss of central vision on left and decreased peripheral vision.   . Stroke (HCC) 12/2011   no residuals noted    Patient Active Problem List   Diagnosis Date Noted  . Hx of CABG 04/18/2017  . Non-ST elevation (NSTEMI) myocardial infarction (HCC)   . Unstable angina (HCC) 04/16/2017  . Hyperlipidemia 03/14/2016  . Hypertensive heart disease 12/17/2015  . Diabetes mellitus (HCC) 01/04/2012  . Hypertension   . Stroke [facility])     Past Surgical History:  Procedure Laterality Date  . CHOLECYSTECTOMY    . CORONARY ARTERY BYPASS GRAFT N/A 04/18/2017   Procedure: CORONARY ARTERY BYPASS GRAFTING (CABG) times four using left  internal mammary artery and right leg saphenous vein;  Surgeon: Kerin Perna, MD;  Location: [facility] OR;  Service: Open Heart Surgery;  Laterality: N/A;  . LEFT HEART CATH AND CORONARY ANGIOGRAPHY N/A 04/17/2017   Procedure: LEFT HEART CATH AND CORONARY ANGIOGRAPHY;  Surgeon: Marykay Lex, MD;  Location: [facility] INVASIVE CV LAB;  Service: Cardiovascular;  Laterality: N/A;  . ROTATOR CUFF REPAIR     left  . TEE WITHOUT CARDIOVERSION N/A 04/18/2017   Procedure: TRANSESOPHAGEAL ECHOCARDIOGRAM (TEE);  Surgeon: Donata Clay, Theron Arista, MD;  Location: [facility] OR;  Service: Open Heart Surgery;  Laterality: N/A;    OB History    No data available       Home Medications    Prior to Admission medications   Medication Sig Start Date End Date Taking? Authorizing Provider  acetaminophen (TYLENOL) 325 MG tablet Take 650 mg by mouth every 4 (four) hours.   Yes [provider]  aspirin EC 325 MG EC tablet Take 1 tablet (325 mg total) by mouth daily. 04/29/17  Yes Asa Lente, Tessa N, PA-C  atorvastatin (LIPITOR) 80 MG tablet Take 1 tablet (80 mg total) by mouth daily at 6 PM. 04/28/17  Yes Asa Lente, Tessa N, PA-C  carvedilol (COREG) 6.25 MG tablet Take 1 tablet (6.25 mg total) by mouth 2 (two) times daily with a meal. 04/28/17  Yes Asa Lente, Tessa N, PA-C  Cholecalciferol (VITAMIN D) 2000 UNITS tablet Take 2,000 Units by mouth  daily.   Yes [provider]  furosemide (LASIX) 40 MG tablet Take 1 tablet (40 mg total) by mouth daily. 04/28/17 05/05/18 Yes Conte, Tessa N, PA-C  insulin aspart (NOVOLOG) 100 UNIT/ML injection Inject 2-15 Units into the skin 3 (three) times daily before meals.   Yes [provider]  insulin detemir (LEVEMIR) 100 UNIT/ML injection Inject 30 Units into the skin at bedtime. Sliding Scale   Yes [provider]  potassium chloride (K-DUR,KLOR-CON) 20 MEQ tablet Take 1 tablet (20 mEq total) by mouth daily. 04/28/17 05/05/17 Yes Conte, Tessa N, PA-C  sitaGLIPtan-metformin (JANUMET)  50-1000 MG per tablet Take 1 tablet by mouth 2 (two) times daily with a meal.   Yes [provider]  traMADol (ULTRAM) 50 MG tablet Take 2 tablets (100 mg total) by mouth every 6 (six) hours as needed for moderate pain. 04/28/17  Yes Conte, Tessa N, PA-C  VESICARE 10 MG tablet Take 10 mg by mouth daily. 12/23/15  Yes [provider]  naphazoline-glycerin (CLEAR EYES) 0.012-0.2 % SOLN Place 1-2 drops into both eyes 4 (four) times daily as needed for eye irritation (eye redness, dryness). Patient not taking: Reported on 04/30/2017 04/28/17   Sharlene Dory, PA-C    Family History Family History  Problem Relation Age of Onset  . Heart attack Father   . Hypertension Father   . Heart attack Brother   . Hypertension Brother   . Hypertension Mother   . Stroke Mother   . Healthy Brother     Social History Social History  Substance Use Topics  . Smoking status: Former Smoker    Packs/day: 0.50    Years: 20.00    Types: Cigarettes    Quit date: 01/02/1997  . Smokeless tobacco: Never Used  . Alcohol use No     Allergies   Patient has no known allergies.   Review of Systems Review of Systems  Constitutional: Positive for appetite change, fatigue and fever.  Respiratory: Positive for shortness of breath.   Genitourinary: Positive for dysuria.  Neurological: Positive for weakness.  All other systems reviewed and are negative.    Physical Exam Updated Vital Signs BP (!) 121/55   Pulse 78   Temp (!) 97.5 F (36.4 C) (Oral)   Resp (!) 23   SpO2 94%   Physical Exam  Constitutional: She is oriented to person, place, and time. She appears well-developed and well-nourished. No distress.  HENT:  Head: Normocephalic and atraumatic.  Mildly dry mucous membranes  Eyes: Pupils are equal, round, and reactive to light. Conjunctivae are normal.  Neck: Neck supple.  Cardiovascular: Normal rate, regular rhythm and normal heart sounds.  Exam reveals no friction rub.   No  murmur heard. Pulmonary/Chest: Effort normal and breath sounds normal. No respiratory distress. She has no wheezes. She has no rales.  Midline sternotomy scar appears clean, dry, and intact with no erythema or drainage  Abdominal: Soft. She exhibits no distension. There is tenderness (mild suprapubic, no CVA tenderness). There is no rebound and no guarding.  Musculoskeletal: She exhibits no edema.  Bilateral lower extremity vein harvest sites are clean, dry, and intact  Neurological: She is alert and oriented to person, place, and time. She exhibits normal muscle tone.  Skin: Skin is warm. Capillary refill takes less than 2 seconds.  Psychiatric: She has a normal mood and affect.  Nursing note and vitals reviewed.    ED Treatments / Results  Labs (all labs ordered are listed,  but only abnormal results are displayed) Labs Reviewed  COMPREHENSIVE METABOLIC PANEL - Abnormal; Notable for the following:       Result Value   Sodium 134 (*)    Potassium 3.3 (*)    BUN 26 (*)    Creatinine, Ser 1.43 (*)    Calcium 8.6 (*)    Total Protein 6.4 (*)    Albumin 2.6 (*)    AST 90 (*)    Total Bilirubin 3.9 (*)    GFR calc non Af Amer 36 (*)    GFR calc Af Amer 42 (*)    All other components within normal limits  CBC WITH DIFFERENTIAL/PLATELET - Abnormal; Notable for the following:    WBC 12.7 (*)    RBC 2.76 (*)    Hemoglobin 7.7 (*)    HCT 22.6 (*)    RDW 16.1 (*)    Neutro Abs 10.1 (*)    All other components within normal limits  URINALYSIS, ROUTINE W REFLEX MICROSCOPIC - Abnormal; Notable for the following:    Color, Urine AMBER (*)    APPearance HAZY (*)    Hgb urine dipstick LARGE (*)    Protein, ur 100 (*)    Bacteria, UA FEW (*)    Squamous Epithelial / LPF 0-5 (*)    All other components within normal limits  CULTURE, BLOOD (ROUTINE X 2)  CULTURE, BLOOD (ROUTINE X 2)  URINE CULTURE  I-STAT CG4 LACTIC ACID, ED  I-STAT CG4 LACTIC ACID, ED  TYPE AND SCREEN  PREPARE RBC  (CROSSMATCH)    EKG  EKG Interpretation  Date/Time:  Sunday April 30 2017 14:46:51 EDT Ventricular Rate:  83 PR Interval:    QRS Duration: 98 QT Interval:  368 QTC Calculation: 433 R Axis:   -11 Text Interpretation:  Sinus rhythm Abnormal R-wave progression, early transition LVH with secondary repolarization abnormality No significant change since last tracing Confirmed by Shaune Pollack [phone]) on 04/30/2017 3:21:10 PM       Radiology Dg Chest 2 View  Result Date: 04/30/2017 CLINICAL DATA:  Fever. No chest pain or SOB. Recent CABG x 4 - April 18, 2017. EXAM: CHEST  2 VIEW COMPARISON:  04/27/2017 FINDINGS: Lung base opacity present on the prior study has improved. There is mild residual atelectasis at the left lung base. Lungs are otherwise clear. Minimal pleural effusions have decreased from prior exam. No pneumothorax. Changes from the recent CABG surgery are noted. The cardiac silhouette is mildly enlarged. No mediastinal or hilar masses or evidence of adenopathy. Skeletal structures are grossly intact. IMPRESSION: No acute cardiopulmonary disease. Electronically Signed   By: Amie Portland M.D.   On: 04/30/2017 16:04   Ct Abdomen Pelvis W Contrast  Result Date: 04/30/2017 CLINICAL DATA:  Abdominal pain with nausea and vomiting. EXAM: CT ABDOMEN AND PELVIS WITH CONTRAST TECHNIQUE: Multidetector CT imaging of the abdomen and pelvis was performed using the standard protocol following bolus administration of intravenous contrast. CONTRAST:  80mL ISOVUE-300 IOPAMIDOL (ISOVUE-300) INJECTION 61% COMPARISON:  None. FINDINGS: Lower chest: Linear atelectasis over the right middle lobe and lingula. Small bilateral pleural effusions with associated atelectasis. Median sternotomy wires are present. Calcification of the mitral valve annulus. Hepatobiliary: Previous cholecystectomy. Liver and biliary tree are normal. Pancreas: Within normal. Spleen: Within normal. Adrenals/Urinary Tract: Adrenal  glands are normal. Kidneys are normal in size with a couple small bilateral nonobstructing stones. There are 2 small 1 cm hypodensities over the left renal cortex too small to characterize but likely  cysts. Ureters are normal. Mild bladder distention which is otherwise unremarkable. Stomach/Bowel: Stomach is within normal. Appendix is normal. Colon is unremarkable. Vascular/Lymphatic: Mild calcified plaque of the abdominal aorta and iliac arteries. No adenopathy. Reproductive: Within normal. Other: No significant free fluid or focal inflammatory change. Left inguinal hernia containing only mesenteric fat. Musculoskeletal: Minimal degenerate change of the spine and hips. IMPRESSION: No acute findings in the abdomen/pelvis. Minimal bilateral nephrolithiasis.  No obstructing stones. Two 1 cm left renal cortical hypodensities too small to characterize, but likely cysts. Small bilateral pleural effusions with bibasilar atelectasis. Aortic Atherosclerosis (ICD10-I70.0). Electronically Signed   By: Elberta Fortis M.D.   On: 04/30/2017 18:18    Procedures .Critical Care Performed by: Shaune Pollack Authorized by: Shaune Pollack   Critical care provider statement:    Critical care time (minutes):  45   Critical care time was exclusive of:  Separately billable procedures and treating other patients and teaching time   Critical care was necessary to treat or prevent imminent or life-threatening deterioration of the following conditions:  Dehydration, sepsis, circulatory failure and cardiac failure   Critical care was time spent personally by me on the following activities:  Development of treatment plan with patient or surrogate, discussions with consultants, evaluation of patient's response to treatment, examination of patient, obtaining history from patient or surrogate, ordering and performing treatments and interventions, ordering and review of laboratory studies, ordering and review of radiographic studies,  pulse oximetry, re-evaluation of patient's condition and review of old charts   I assumed direction of critical care for this patient from another provider in my specialty: no      (including critical care time)  Medications Ordered in ED Medications  vancomycin (VANCOCIN) 1,500 mg in sodium chloride 0.9 % 500 mL IVPB (1,500 mg Intravenous New Bag/Given 04/30/17 1811)  piperacillin-tazobactam (ZOSYN) IVPB 3.375 g (not administered)  vancomycin (VANCOCIN) IVPB 1000 mg/200 mL premix (not administered)  0.9 %  sodium chloride infusion ( Intravenous Hold 04/30/17 1813)  acetaminophen (TYLENOL) tablet 1,000 mg (1,000 mg Oral Given 04/30/17 1613)  sodium chloride 0.9 % bolus 500 mL (0 mLs Intravenous Stopped 04/30/17 1811)  0.9 %  sodium chloride infusion ( Intravenous New Bag/Given 04/30/17 1711)  piperacillin-tazobactam (ZOSYN) IVPB 3.375 g (0 g Intravenous Stopped 04/30/17 1750)  potassium chloride SA (K-DUR,KLOR-CON) CR tablet 40 mEq (40 mEq Oral Given 04/30/17 1711)  iopamidol (ISOVUE-300) 61 % injection (80 mLs  Contrast Given 04/30/17 1750)     Initial Impression / Assessment and Plan / ED Course  I have reviewed the triage vital signs and the nursing notes.  Pertinent labs & imaging results that were available during my care of the patient were reviewed by me and considered in my medical decision making (see chart for details).    71 year old female with past medical history as above, status post recent CABG, who presents with generalized fatigue and fever. Primary concern at this time is UTI, persistent pneumonia in setting of likely atelectasis from decreased mobility. Will check broad labs, cultures, and patient will likely need admission for this. She has no focal neuro deficits to suggest CVA. Her abdomen is otherwise soft without evidence of acute intra-abdominal pathology. She has no hypertension or signs of severe sepsis, so we will hold until her antibiotics as appropriate at this  time.  Labwork reviewed as above. She does have some hematuria and bacteria in her urine, so UTI may be the source, though this is not obvious. Chest x-ray is  clear. Patient does have leukocytosis and given her fever and signs of service, will cover with broad-spectrum antibiotics. Will also obtain DVT study of bilateral lower extremities given her recent surgery. Otherwise, she does have some mild abdominal tenderness is CT obtained and shows no acute laterality. She feels improved with fluids and fever control. Of note, hemoglobin 7.7. Her goal is greater than 8 so will start transfusion here. Will admit to medicine for ongoing management of fever and anemia.  Final Clinical Impressions(s) / ED Diagnoses   Final diagnoses:  Fever in adult  Hypokalemia  Abdominal pain, lower  Symptomatic anemia  Sepsis, due to unspecified organism [facility])    New Prescriptions New Prescriptions   No medications on file     Shaune Pollack, MD 04/30/17 1909

## 2017-04-30 NOTE — ED Notes (Signed)
VASCULAR TECH AWARE OF PATIENT

## 2017-04-30 NOTE — Progress Notes (Addendum)
Pharmacy Antibiotic Note  Andrea [name] is a 71 y.o. female admitted on 04/30/2017 with intermittent fevers. Pt was discharged on Friday s/p CABG x4.  SCr up slightly 1.1 > 1.4, eCrCl ~ 30 ml/min. LA wnl  Plan: -Vancomycin 1500 mg IV x1 then 1g/24h -Zosyn 3.375 g IV q8h -Monitor renal fx, cultures, VT as needed     Temp (24hrs), Avg:99.9 F (37.7 C), Min:99 F (37.2 C), Max:100.7 F (38.2 C)   Recent Labs Lab 04/24/17 0231 04/25/17 0348 04/30/17 1459 04/30/17 1515  WBC 8.6 9.8 PENDING  --   CREATININE 1.21* 1.17* 1.43*  --   LATICACIDVEN  --   --   --  1.02     Antimicrobials this admission: 9/23 vancomycin > 9/23 zosyn >  Dose adjustments this admission: N/A  Microbiology results: 9/23 blood cx: 9/23 urine cx:   [place] Friday 04/30/2017 4:56 PM

## 2017-04-30 NOTE — ED Notes (Signed)
Patient transported to X-ray 

## 2017-04-30 NOTE — Progress Notes (Signed)
VASCULAR LAB PRELIMINARY  PRELIMINARY  PRELIMINARY  PRELIMINARY  Bilateral lower extremity venous duplex completed.    Preliminary report:  There is no DVT or SVT noted in the bilateral lower extremities.  Waveforms are pulsatile suggestive of fluid overload.   Gerome Kokesh, RVT 04/30/2017, 7:30 PM

## 2017-04-30 NOTE — ED Notes (Signed)
Patient transported to CT 

## 2017-04-30 NOTE — ED Triage Notes (Signed)
To room via EMS.  Pt d/c from [facility] 04-28-17 s/p CABG x 4.  Per [facility] home notes, since 04-28-17 temperatures up and down.  Yesterday temp 100.2 oral, tylenol was given, today oral temp 101.2, Tylenol 650 mg given @ 12:30p.  Incision mid sternal, no redness, drainage, swelling.  Incision x 2 to bilateral LE, intact, no redness, drainage or swelling.

## 2017-04-30 NOTE — ED Notes (Signed)
Patient placed on PureWick female external catheter. 

## 2017-04-30 NOTE — H&P (Signed)
History and Physical    Andrea [name]:[id number] DOB: Jan 17, 1946 DOA: 04/30/2017  PCP: Laurena Slimmer, MD   Patient coming from: SNF  Chief Complaint: Fevers, gen weakness, fatigue  HPI: Andrea [name] is a 71 y.o. female with medical history significant for insulin-dependent diabetes mellitus, hypertension, chronic diastolic CHF, and coronary artery disease status post CABG on 04/18/2017, now presenting to the emergency department from her SNF for evaluation of fevers with generalized weakness and lethargy. Patient was discharged from the hospital to SNF on 04/28/2017 and reports that she was starting to have fevers at that time. She has continued to have fevers at the nursing facility that a been treated with Tylenol. She reports generalized weakness and fatigue, worsening over the past several days. She denies rhinorrhea, sore throat, cough, dyspnea, abdominal pain, nausea, vomiting, or diarrhea. She reports some suprapubic discomfort but denies abdominal or flank pain. No rash is reported and her incision sites are said to be healing appropriately with no sign of infection. She denies neck stiffness, headache, change in vision or hearing, or focal numbness or weakness. Denies any significant swelling in her lower extremities and denies chest pain or palpitations.  ED Course: Upon arrival to the ED, patient is found to be febrile to 38 C, saturating 90% on room air, and with vitals otherwise stable. EKG features a sinus rhythm with LVH by voltage criteria and secondary repolarization abnormality. Chest x-ray is negative for acute cardio pulmonary disease. CT of the abdomen and pelvis is negative for acute findings. CMP features a mild hyponatremia and hypokalemia, as well as a new hyperbilirubinemia to 3.9 and elevation in AST to 90. Also notable on the chemistry panels a creatinine of 1.43, up from 0.6 earlier this month. CBC is notable for a leukocytosis to 12,700 and a normocytic anemia  with hemoglobin of 7.7, down from a recent prior of 8.6. Lactic acid is reassuring at 1.02 and urinalysis is unremarkable. Blood and urine cultures were collected in the ED, 500 mL normal saline was administered, patient was treated with 40 mEq oral potassium, 1 g of acetaminophen, and empiric vancomycin and Zosyn. She remained clinically stable and in no apparent respiratory distress in the ED and will be observed on medical-surgical unit for ongoing evaluation and management of symptomatic anemia and subacute postoperative fever with no identifiable infectious process.  Review of Systems:  All other systems reviewed and apart from HPI, are negative.  Past Medical History:  Diagnosis Date  . Diabetes mellitus   . Hypertension   . Macular degeneration disease    loss of central vision on left and decreased peripheral vision.   . Stroke (HCC) 12/2011   no residuals noted    Past Surgical History:  Procedure Laterality Date  . CHOLECYSTECTOMY    . CORONARY ARTERY BYPASS GRAFT N/A 04/18/2017   Procedure: CORONARY ARTERY BYPASS GRAFTING (CABG) times four using left internal mammary artery and right leg saphenous vein;  Surgeon: Kerin Perna, MD;  Location: [facility] OR;  Service: Open Heart Surgery;  Laterality: N/A;  . LEFT HEART CATH AND CORONARY ANGIOGRAPHY N/A 04/17/2017   Procedure: LEFT HEART CATH AND CORONARY ANGIOGRAPHY;  Surgeon: Marykay Lex, MD;  Location: [facility] INVASIVE CV LAB;  Service: Cardiovascular;  Laterality: N/A;  . ROTATOR CUFF REPAIR     left  . TEE WITHOUT CARDIOVERSION N/A 04/18/2017   Procedure: TRANSESOPHAGEAL ECHOCARDIOGRAM (TEE);  Surgeon: Donata Clay, Theron Arista, MD;  Location: [facility] OR;  Service: Open Heart  Surgery;  Laterality: N/A;     reports that she quit smoking about 20 years ago. Her smoking use included Cigarettes. She has a 10.00 pack-year smoking history. She has never used smokeless tobacco. She reports that she does not drink alcohol or use drugs.  No Known  Allergies  Family History  Problem Relation Age of Onset  . Heart attack Father   . Hypertension Father   . Heart attack Brother   . Hypertension Brother   . Hypertension Mother   . Stroke Mother   . Healthy Brother      Prior to Admission medications   Medication Sig Start Date End Date Taking? Authorizing Provider  acetaminophen (TYLENOL) 325 MG tablet Take 650 mg by mouth every 4 (four) hours.   Yes [provider]  aspirin EC 325 MG EC tablet Take 1 tablet (325 mg total) by mouth daily. 04/29/17  Yes Asa Lente, Tessa N, PA-C  atorvastatin (LIPITOR) 80 MG tablet Take 1 tablet (80 mg total) by mouth daily at 6 PM. 04/28/17  Yes Asa Lente, Tessa N, PA-C  carvedilol (COREG) 6.25 MG tablet Take 1 tablet (6.25 mg total) by mouth 2 (two) times daily with a meal. 04/28/17  Yes Asa Lente, Tessa N, PA-C  Cholecalciferol (VITAMIN D) 2000 UNITS tablet Take 2,000 Units by mouth daily.   Yes [provider]  furosemide (LASIX) 40 MG tablet Take 1 tablet (40 mg total) by mouth daily. 04/28/17 05/05/18 Yes Conte, Tessa N, PA-C  insulin aspart (NOVOLOG) 100 UNIT/ML injection Inject 2-15 Units into the skin 3 (three) times daily before meals.   Yes [provider]  insulin detemir (LEVEMIR) 100 UNIT/ML injection Inject 30 Units into the skin at bedtime. Sliding Scale   Yes [provider]  potassium chloride (K-DUR,KLOR-CON) 20 MEQ tablet Take 1 tablet (20 mEq total) by mouth daily. 04/28/17 05/05/17 Yes Conte, Tessa N, PA-C  sitaGLIPtan-metformin (JANUMET) 50-1000 MG per tablet Take 1 tablet by mouth 2 (two) times daily with a meal.   Yes [provider]  traMADol (ULTRAM) 50 MG tablet Take 2 tablets (100 mg total) by mouth every 6 (six) hours as needed for moderate pain. 04/28/17  Yes Conte, Tessa N, PA-C  VESICARE 10 MG tablet Take 10 mg by mouth daily. 12/23/15  Yes [provider]    Physical Exam: Vitals:   04/30/17 1727 04/30/17 1730 04/30/17 1845 04/30/17  1954  BP:  127/60 (!) 121/55   Pulse:  70 78 73  Resp:  (!) 23  (!) 26  Temp: (!) 97.5 F (36.4 C)   98.1 F (36.7 C)  TempSrc: Oral   Oral  SpO2:  97% 94% 98%      Constitutional: NAD, calm, uncomfortable, jaundiced Eyes: PERTLA, lids and conjunctivae normal ENMT: Mucous membranes are moist. Posterior pharynx clear of any exudate or lesions.   Neck: normal, supple, no masses, no thyromegaly Respiratory: clear to auscultation bilaterally, no wheezing, no crackles. Normal respiratory effort.    Cardiovascular: S1 & S2 heard, regular rate and rhythm. Trace pretibial edema bilaterally. No significant JVD. Abdomen: No distension, no tenderness, no masses palpated. Bowel sounds normal.  Musculoskeletal: no clubbing / cyanosis. No joint deformity upper and lower extremities.  Skin: Jaundice. Sternal incision and bilateral lower leg incision are c/d/i with no drainage or erythema.   Neurologic: CN 2-12 grossly intact. Sensation intact. Strength 5/5 in all 4 limbs.  Psychiatric: Alert and oriented x 3. Blunted affect, depressed mood, poor cooperation with interview  and exam.     Labs on Admission: I have personally reviewed following labs and imaging studies  CBC:  Recent Labs Lab 04/24/17 0231 04/25/17 0348 04/30/17 1459  WBC 8.6 9.8 12.7*  NEUTROABS  --   --  10.1*  HGB 8.8* 8.6* 7.7*  HCT 26.8* 26.2* 22.6*  MCV 82.5 82.4 81.9  PLT 217 252 200   Basic Metabolic Panel:  Recent Labs Lab 04/24/17 0231 04/25/17 0348 04/30/17 1459  NA 136 137 134*  K 4.5 4.1 3.3*  CL 103 103 102  CO2 28 26 22   GLUCOSE 183* 200* 93  BUN 18 17 26*  CREATININE 1.21* 1.17* 1.43*  CALCIUM 8.4* 8.7* 8.6*   GFR: Estimated Creatinine Clearance: 33.2 mL/min (A) (by C-G formula based on SCr of 1.43 mg/dL (H)). Liver Function Tests:  Recent Labs Lab 04/30/17 1459  AST 90*  ALT 40  ALKPHOS 72  BILITOT 3.9*  PROT 6.4*  ALBUMIN 2.6*   No results for input(s): LIPASE, AMYLASE in the  last 168 hours. No results for input(s): AMMONIA in the last 168 hours. Coagulation Profile: No results for input(s): INR, PROTIME in the last 168 hours. Cardiac Enzymes: No results for input(s): CKTOTAL, CKMB, CKMBINDEX, TROPONINI in the last 168 hours. BNP (last 3 results) No results for input(s): PROBNP in the last 8760 hours. HbA1C: No results for input(s): HGBA1C in the last 72 hours. CBG:  Recent Labs Lab 04/27/17 0011 04/27/17 0558 04/27/17 1113 04/27/17 1709 04/27/17 2105  GLUCAP 221* 156* 207* 134* 200*   Lipid Profile: No results for input(s): CHOL, HDL, LDLCALC, TRIG, CHOLHDL, LDLDIRECT in the last 72 hours. Thyroid Function Tests: No results for input(s): TSH, T4TOTAL, FREET4, T3FREE, THYROIDAB in the last 72 hours. Anemia Panel: No results for input(s): VITAMINB12, FOLATE, FERRITIN, TIBC, IRON, RETICCTPCT in the last 72 hours. Urine analysis:    Component Value Date/Time   COLORURINE AMBER (A) 04/30/2017 1459   APPEARANCEUR HAZY (A) 04/30/2017 1459   LABSPEC 1.011 04/30/2017 1459   PHURINE 5.0 04/30/2017 1459   GLUCOSEU NEGATIVE 04/30/2017 1459   HGBUR LARGE (A) 04/30/2017 1459   BILIRUBINUR NEGATIVE 04/30/2017 1459   KETONESUR NEGATIVE 04/30/2017 1459   PROTEINUR 100 (A) 04/30/2017 1459   NITRITE NEGATIVE 04/30/2017 1459   LEUKOCYTESUR NEGATIVE 04/30/2017 1459   Sepsis Labs: @LABRCNTIP (procalcitonin:4,lacticidven:4) )No results found for this or any previous visit (from the past 240 hour(s)).   Radiological Exams on Admission: Dg Chest 2 View  Result Date: 04/30/2017 CLINICAL DATA:  Fever. No chest pain or SOB. Recent CABG x 4 - April 18, 2017. EXAM: CHEST  2 VIEW COMPARISON:  04/27/2017 FINDINGS: Lung base opacity present on the prior study has improved. There is mild residual atelectasis at the left lung base. Lungs are otherwise clear. Minimal pleural effusions have decreased from prior exam. No pneumothorax. Changes from the recent CABG surgery  are noted. The cardiac silhouette is mildly enlarged. No mediastinal or hilar masses or evidence of adenopathy. Skeletal structures are grossly intact. IMPRESSION: No acute cardiopulmonary disease. Electronically Signed   By: Amie Portland M.D.   On: 04/30/2017 16:04   Ct Abdomen Pelvis W Contrast  Result Date: 04/30/2017 CLINICAL DATA:  Abdominal pain with nausea and vomiting. EXAM: CT ABDOMEN AND PELVIS WITH CONTRAST TECHNIQUE: Multidetector CT imaging of the abdomen and pelvis was performed using the standard protocol following bolus administration of intravenous contrast. CONTRAST:  80mL ISOVUE-300 IOPAMIDOL (ISOVUE-300) INJECTION 61% COMPARISON:  None. FINDINGS: Lower chest: Linear atelectasis  over the right middle lobe and lingula. Small bilateral pleural effusions with associated atelectasis. Median sternotomy wires are present. Calcification of the mitral valve annulus. Hepatobiliary: Previous cholecystectomy. Liver and biliary tree are normal. Pancreas: Within normal. Spleen: Within normal. Adrenals/Urinary Tract: Adrenal glands are normal. Kidneys are normal in size with a couple small bilateral nonobstructing stones. There are 2 small 1 cm hypodensities over the left renal cortex too small to characterize but likely cysts. Ureters are normal. Mild bladder distention which is otherwise unremarkable. Stomach/Bowel: Stomach is within normal. Appendix is normal. Colon is unremarkable. Vascular/Lymphatic: Mild calcified plaque of the abdominal aorta and iliac arteries. No adenopathy. Reproductive: Within normal. Other: No significant free fluid or focal inflammatory change. Left inguinal hernia containing only mesenteric fat. Musculoskeletal: Minimal degenerate change of the spine and hips. IMPRESSION: No acute findings in the abdomen/pelvis. Minimal bilateral nephrolithiasis.  No obstructing stones. Two 1 cm left renal cortical hypodensities too small to characterize, but likely cysts. Small bilateral  pleural effusions with bibasilar atelectasis. Aortic Atherosclerosis (ICD10-I70.0). Electronically Signed   By: Elberta Fortis M.D.   On: 04/30/2017 18:18    EKG: Independently reviewed. Sinus rhythm, LVH with secondary repolarization abnormality.   Assessment/Plan  1. Symptomatic anemia  - Pt presents with lethargy, gen weakness, and is found to have Hgb of 7.7  - Hgb had been 11.5 prior to surgery, she was transfused after, and had Hgb 8.6 on 04/25/17 - Denies melena or hematochezia  - One unit pRBC's transfusing in ED - Check post-transfusion H&H, check anemia panel and FOBT   2. CAD s/p recent CABG  - No anginal complaints  - Underwent CABG on 04/18/17  - Incision sites appear to be healing appropriately with no sign of infection  - Continue ASA, Lipitor, Coreg    3. SIRS  - Pt presents with fevers and is found to have leukocytosis to 12,700  - No infectious process has been identified  - Blood and urine cultures were collected in ED and patient was given doses of empiric vancomycin and Zosyn  - Suspect a non-infectious etiology  - Follow-up venous dopplers, encourage use of incentive spirometer  - Plan to continue Zosyn pending RUQ ultrasound   4. Acute kidney injury  - SCr is 1.43 on admission, up from 0.6 earlier this month  - Unclear etiology, will check urine studies, repeat chem panel in am    5. Chronic diastolic CHF  - Pt appears hypervolemic on admission  - She had received 500 cc NS in ED and is being transfused a unit of RBC's  - Plan to SLIV after transfusion, consider Lasix as needed tonight  - Continue Lasix 40 mg qD, Coreg  - Follow daily wts and I/O's    6. Hypokalemia  - Serum potassium is 3.3 on admission  - She was treated with 40 mEq oral potassium in ED  - Continue scheduled supplementation   7. Insulin-dependent DM  - A1c was 7.8% earlier this month  - Managed at home with Levemir 30 units, Novolog 2-15 units, and Janumet  - Check CBG's with meals  and qHS  - Continue Levemir at 25 units qHS with Novolog 2 units TID and as correctional per sliding-scale    8. Painless jaundice  - Total bilirubin 3.9 on admission with AST 90 - These were normal earlier this month; there is no significant RUQ pain  - Given anemia, check fractionated bili, LDH, and haptoglobin  - Check RUQ [place] given fever  of uncertain etiology     DVT prophylaxis: SCD's Code Status: Full  Family Communication: Husband and daughter updated at bedside at patient's request  Disposition Plan: Admit to med-surg Consults called: None Admission status: Inpatient    Briscoe Deutscher, MD [facility] Pager [phone]  If 7PM-7AM, please contact night-coverage [email or web address] Password [email or web address]  04/30/2017, 7:58 PM

## 2017-05-01 DIAGNOSIS — R1030 Lower abdominal pain, unspecified: Secondary | ICD-10-CM

## 2017-05-01 DIAGNOSIS — L8990 Pressure ulcer of unspecified site, unspecified stage: Secondary | ICD-10-CM | POA: Insufficient documentation

## 2017-05-01 LAB — TYPE AND SCREEN
ABO/RH(D): O POS
Antibody Screen: POSITIVE
DAT, IGG: NEGATIVE
PT AG Type: NEGATIVE
UNIT DIVISION: 0
Unit division: 0
Unit division: 0

## 2017-05-01 LAB — CBC WITH DIFFERENTIAL/PLATELET
BASOS PCT: 1 %
Basophils Absolute: 0.1 10*3/uL (ref 0.0–0.1)
EOS ABS: 0 10*3/uL (ref 0.0–0.7)
Eosinophils Relative: 0 %
HCT: 22 % — ABNORMAL LOW (ref 36.0–46.0)
Hemoglobin: 7.3 g/dL — ABNORMAL LOW (ref 12.0–15.0)
Lymphocytes Relative: 10 %
Lymphs Abs: 1.1 10*3/uL (ref 0.7–4.0)
MCH: 27.7 pg (ref 26.0–34.0)
MCHC: 33.2 g/dL (ref 30.0–36.0)
MCV: 83.3 fL (ref 78.0–100.0)
MONO ABS: 1.2 10*3/uL — AB (ref 0.1–1.0)
Monocytes Relative: 11 %
NEUTROS PCT: 78 %
Neutro Abs: 8.6 10*3/uL — ABNORMAL HIGH (ref 1.7–7.7)
PLATELETS: 135 10*3/uL — AB (ref 150–400)
RBC: 2.64 MIL/uL — ABNORMAL LOW (ref 3.87–5.11)
RDW: 16.6 % — ABNORMAL HIGH (ref 11.5–15.5)
WBC: 11 10*3/uL — AB (ref 4.0–10.5)

## 2017-05-01 LAB — COMPREHENSIVE METABOLIC PANEL
ALK PHOS: 75 U/L (ref 38–126)
ALT: 50 U/L (ref 14–54)
AST: 107 U/L — ABNORMAL HIGH (ref 15–41)
Albumin: 2.5 g/dL — ABNORMAL LOW (ref 3.5–5.0)
Anion gap: 12 (ref 5–15)
BILIRUBIN TOTAL: 3.2 mg/dL — AB (ref 0.3–1.2)
BUN: 26 mg/dL — ABNORMAL HIGH (ref 6–20)
CALCIUM: 8.5 mg/dL — AB (ref 8.9–10.3)
CO2: 19 mmol/L — AB (ref 22–32)
CREATININE: 1.45 mg/dL — AB (ref 0.44–1.00)
Chloride: 103 mmol/L (ref 101–111)
GFR, EST AFRICAN AMERICAN: 41 mL/min — AB (ref >60)
GFR, EST NON AFRICAN AMERICAN: 35 mL/min — AB (ref >60)
Glucose, Bld: 136 mg/dL — ABNORMAL HIGH (ref 65–99)
Potassium: 4 mmol/L (ref 3.5–5.1)
Sodium: 134 mmol/L — ABNORMAL LOW (ref 135–145)
Total Protein: 6.2 g/dL — ABNORMAL LOW (ref 6.5–8.1)

## 2017-05-01 LAB — URINE CULTURE: Culture: NO GROWTH

## 2017-05-01 LAB — BPAM RBC
BLOOD PRODUCT EXPIRATION DATE: 201809292359
BLOOD PRODUCT EXPIRATION DATE: 201809302359
Blood Product Expiration Date: 201809242359
ISSUE DATE / TIME: 201809191229
ISSUE DATE / TIME: 201809241249
UNIT TYPE AND RH: 5100
UNIT TYPE AND RH: 5100
UNIT TYPE AND RH: 5100

## 2017-05-01 LAB — GLUCOSE, CAPILLARY
GLUCOSE-CAPILLARY: 133 mg/dL — AB (ref 65–99)
Glucose-Capillary: 200 mg/dL — ABNORMAL HIGH (ref 65–99)
Glucose-Capillary: 235 mg/dL — ABNORMAL HIGH (ref 65–99)
Glucose-Capillary: 219 mg/dL — ABNORMAL HIGH (ref 65–99)

## 2017-05-01 LAB — CREATININE, URINE, RANDOM: Creatinine, Urine: 65.7 mg/dL

## 2017-05-01 LAB — SODIUM, URINE, RANDOM: Sodium, Ur: 60 mmol/L

## 2017-05-01 LAB — PREPARE RBC (CROSSMATCH)

## 2017-05-01 MED ORDER — POTASSIUM CHLORIDE CRYS ER 20 MEQ PO TBCR
20.0000 meq | EXTENDED_RELEASE_TABLET | Freq: Every day | ORAL | Status: DC
  Administered 2017-05-02: 20 meq via ORAL
  Filled 2017-05-01: qty 1

## 2017-05-01 MED ORDER — FERROUS SULFATE 325 (65 FE) MG PO TABS
325.0000 mg | ORAL_TABLET | Freq: Two times a day (BID) | ORAL | Status: DC
  Administered 2017-05-01 – 2017-05-05 (×8): 325 mg via ORAL
  Filled 2017-05-01 (×8): qty 1

## 2017-05-01 MED ORDER — INSULIN ASPART 100 UNIT/ML ~~LOC~~ SOLN
0.0000 [IU] | Freq: Three times a day (TID) | SUBCUTANEOUS | Status: DC
  Administered 2017-05-01: 5 [IU] via SUBCUTANEOUS
  Administered 2017-05-02 (×2): 2 [IU] via SUBCUTANEOUS
  Administered 2017-05-03: 5 [IU] via SUBCUTANEOUS
  Administered 2017-05-03: 3 [IU] via SUBCUTANEOUS
  Administered 2017-05-03 – 2017-05-04 (×2): 2 [IU] via SUBCUTANEOUS
  Administered 2017-05-04: 8 [IU] via SUBCUTANEOUS
  Administered 2017-05-04 – 2017-05-05 (×2): 2 [IU] via SUBCUTANEOUS

## 2017-05-01 NOTE — Clinical Social Work Placement (Signed)
   CLINICAL SOCIAL WORK PLACEMENT  NOTE  Date:  05/01/2017  Patient Details  Name: Andrea [name] MRN: [id number] Date of Birth: [birth year]  Clinical Social Work is seeking post-discharge placement for this patient at the Skilled  Nursing Facility level of care (*CSW will initial, date and re-position this form in  chart as items are completed):      Patient/family provided with [facility] Clinical Social Work Department's list of facilities offering this level of care within the geographic area requested by the patient (or if unable, by the patient's family).  Yes   Patient/family informed of their freedom to choose among providers that offer the needed level of care, that participate in Medicare, Medicaid or managed care program needed by the patient, have an available bed and are willing to accept the patient.      Patient/family informed of [facility]'s ownership interest in [facility] and [facility], as well as of the fact that they are under no obligation to receive care at these facilities.  PASRR submitted to EDS on       PASRR number received on 05/01/17     Existing PASRR number confirmed on       FL2 transmitted to all facilities in geographic area requested by pt/family on 05/01/17     FL2 transmitted to all facilities within larger geographic area on       Patient informed that his/her managed care company has contracts with or will negotiate with certain facilities, including the following:        Yes   Patient/family informed of bed offers received.  Patient chooses bed at [name]     Physician recommends and patient chooses bed at      Patient to be transferred to [name] on  .  Patient to be transferred to facility by [facility]     Patient family notified on   of transfer.  Name of family member notified:        PHYSICIAN       Additional Comment:    _______________________________________________ Maree Krabbe, LCSW 05/01/2017, 11:39  AM

## 2017-05-01 NOTE — Progress Notes (Addendum)
PROGRESS NOTE  Andrea [name]  WJX:[id number] DOB: 1945/10/13 DOA: 04/30/2017 PCP: Laurena Slimmer, MD  Brief Narrative:   Andrea [name] is a 71 y.o. female with medical history significant for insulin-dependent diabetes mellitus, hypertension, chronic diastolic CHF, and coronary artery disease status post CABG on 04/18/2017, now presenting to the emergency department from her SNF for evaluation of fevers with generalized weakness and lethargy. Patient was discharged from the hospital to SNF on 04/28/2017 and reports that she was starting to have fevers at that time. She has continued to have fevers at the nursing facility that a been treated with Tylenol. She reports generalized weakness and fatigue, worsening over the past several days. She denies rhinorrhea, sore throat, cough, dyspnea, abdominal pain, nausea, vomiting, or diarrhea. She reports some suprapubic discomfort but denies abdominal or flank pain.  No rash is reported and her incision sites are said to be healing appropriately with no sign of infection. She denies neck stiffness, headache, change in vision or hearing, or focal numbness or weakness. Denies any significant swelling in her lower extremities and denies chest pain or palpitations.  In the ER, she was febrile to 38 C, saturating 90% on room air.  Chest x-ray negative for acute cardio pulmonary disease. CT of the abdomen and pelvis and RUQ US unremarkable.  She has an indirect hyperbilirubinemia suggestive of hemolysis and her hemoglobin has drifted down.  She also has a mild increase in her creatinine.   Her right leg incision may have some slight surrounding erythema, but very subtle.  Duplex lower extremities negative.    Assessment & Plan:   Principal Problem:   Symptomatic anemia Active Problems:   Hypertension   Diabetes mellitus, type II, insulin dependent (HCC)   CAD (coronary artery disease)   SIRS (systemic inflammatory response syndrome) (HCC)  Hyperbilirubinemia   AKI (acute kidney injury) (HCC)   Chronic diastolic CHF (congestive heart failure) (HCC)   History of CVA in adulthood   Fever in adult   Hypokalemia   Jaundice   Pressure injury of skin  Symptomatic anemia (fatigue/lethargy), due to hemolysis post-op (which would explain her indirect hyperbili and LFTs and elevated LDH) and some iron deficiency.  I suspect she has delayed hemolytic transfusion reaction which can occur between 1-2 weeks post-transfusion and presents with fevers, jaundice, hemolysis, and new antiglobulin detection by the blood bank.  It appears the patient initially tested positive for anti-E prior to surgery and subsequently is positive for anti-E and anti-c -  Check coombs   -  Definitely need to wait for verification that blood is compatible prior to blood transfusion -  Transfusing 1 unit PRBC -  Start oral iron -  Occult stool   SIRS, possibly due to a right lower extremity perioperative cellulitis (doubt), expected low grade fever from surgery or from hemolysis as above - Blood and urine cultures pending - Continue vancomycin and Zosyn empirically for 48 hours until infection deemed unlikely - Lower extremity duplex negative - continue IS -  Reexamine lower extremities tomorrow  2. CAD s/p recent CABG  - No anginal complaints  - Underwent CABG on 04/18/17  - Continue ASA, Lipitor, Coreg    4. Acute kidney injury, may be secondary to diuretics plus anemia or hemolysis - SCr is 1.43 on admission, up from 0.6 earlier this month  -  Received lasix this morning -  Transfusing blood  5. Chronic diastolic CHF  -  Continued lasix this morning but will  cancel for tomorrow morning pending reexamination and blood work -  Repeat BMP in AM  6. Hypokalemia  - decreased potassium supplementation while holding lasix  7. Insulin-dependent DM, mildly hyperglycemic at lunchtime today - A1c was 7.8% earlier this month  - Managed at home with Levemir  30 units, Novolog 2-15 units, and Janumet  - Continue Levemir at 25 units qHS with Novolog 2 units TID  -  Increase SSI   DVT prophylaxis:  SCDs pending occult stool Code Status:  full Family Communication:  Patient and her son and daughter  Disposition Plan:  Likely back to SNF in 2 days   Consultants:   None  Procedures:  Duplex lower extremity  Antimicrobials:  Anti-infectives    Start     Dose/Rate Route Frequency Ordered Stop   05/01/17 1800  vancomycin (VANCOCIN) IVPB 1000 mg/200 mL premix  Status:  Discontinued     1,000 mg 200 mL/hr over 60 Minutes Intravenous Every 24 hours 04/30/17 1703 04/30/17 1958   04/30/17 2300  piperacillin-tazobactam (ZOSYN) IVPB 3.375 g     3.375 g 12.5 mL/hr over 240 Minutes Intravenous Every 8 hours 04/30/17 1703     04/30/17 1730  vancomycin (VANCOCIN) 1,500 mg in sodium chloride 0.9 % 500 mL IVPB     1,500 mg 250 mL/hr over 120 Minutes Intravenous  Once 04/30/17 1656 04/30/17 2011   04/30/17 1700  piperacillin-tazobactam (ZOSYN) IVPB 3.375 g     3.375 g 100 mL/hr over 30 Minutes Intravenous  Once 04/30/17 1646 05/01/17 0853   04/30/17 1700  vancomycin (VANCOCIN) IVPB 1000 mg/200 mL premix  Status:  Discontinued     1,000 mg 200 mL/hr over 60 Minutes Intravenous  Once 04/30/17 1646 04/30/17 1656       Subjective:  Denies SOB, chest pain, nausea, vomiting, diarrhea, sore throat, sinus congestion  Objective: Vitals:   04/30/17 2153 05/01/17 0500 05/01/17 0544 05/01/17 1338  BP: 125/65  134/70 111/73  Pulse: 74  82 78  Resp: 19  18 (!) 126  Temp: 98.6 F (37 C)  98.8 F (37.1 C) 97.8 F (36.6 C)  TempSrc: Oral  Oral Axillary  SpO2: 96%  94% 98%  Weight:  77.7 kg (171 lb 4.8 oz)      Intake/Output Summary (Last 24 hours) at 05/01/17 1440 Last data filed at 05/01/17 0650  Gross per 24 hour  Intake             1103 ml  Output              300 ml  Net              803 ml   Filed Weights   05/01/17 0500  Weight: 77.7  kg (171 lb 4.8 oz)    Examination:  General exam:  Adult female, sitting at edge of bed.  No acute distress.  HEENT:  NCAT, MMM Respiratory system: Clear to auscultation bilaterally Cardiovascular system: Regular rate and rhythm, no murmur.  Warm extremities.  Sternotomy scar appears to be healing well, no surrounding erythema or induration Gastrointestinal system: Normal active bowel sounds, soft, nondistended, nontender. MSK:  Normal tone and bulk, trace lower extremity edema.  Incisions on legs also appear to be healing well, although the right leg may have slightly more surrounding pinkness than the left Neuro:  Grossly intact    Data Reviewed: I have personally reviewed following labs and imaging studies  CBC:  Recent Labs Lab 04/25/17 0348 04/30/17 1459  05/01/17 0552  WBC 9.8 12.7* 11.0*  NEUTROABS  --  10.1* 8.6*  HGB 8.6* 7.7* 7.3*  HCT 26.2* 22.6* 22.0*  MCV 82.4 81.9 83.3  PLT 252 200 135*   Basic Metabolic Panel:  Recent Labs Lab 04/25/17 0348 04/30/17 1459 05/01/17 0552  NA 137 134* 134*  K 4.1 3.3* 4.0  CL 103 102 103  CO2 26 22 19*  GLUCOSE 200* 93 136*  BUN 17 26* 26*  CREATININE 1.17* 1.43* 1.45*  CALCIUM 8.7* 8.6* 8.5*   GFR: Estimated Creatinine Clearance: 33.6 mL/min (A) (by C-G formula based on SCr of 1.45 mg/dL (H)). Liver Function Tests:  Recent Labs Lab 04/30/17 1459 04/30/17 1957 05/01/17 0552  AST 90*  --  107*  ALT 40  --  50  ALKPHOS 72  --  75  BILITOT 3.9* 3.8* 3.2*  PROT 6.4*  --  6.2*  ALBUMIN 2.6*  --  2.5*   No results for input(s): LIPASE, AMYLASE in the last 168 hours. No results for input(s): AMMONIA in the last 168 hours. Coagulation Profile: No results for input(s): INR, PROTIME in the last 168 hours. Cardiac Enzymes: No results for input(s): CKTOTAL, CKMB, CKMBINDEX, TROPONINI in the last 168 hours. BNP (last 3 results) No results for input(s): PROBNP in the last 8760 hours. HbA1C: No results for input(s):  HGBA1C in the last 72 hours. CBG:  Recent Labs Lab 04/30/17 2154 04/30/17 2219 04/30/17 2302 05/01/17 0745 05/01/17 1142  GLUCAP 77 82 111* 133* 200*   Lipid Profile: No results for input(s): CHOL, HDL, LDLCALC, TRIG, CHOLHDL, LDLDIRECT in the last 72 hours. Thyroid Function Tests: No results for input(s): TSH, T4TOTAL, FREET4, T3FREE, THYROIDAB in the last 72 hours. Anemia Panel:  Recent Labs  04/30/17 1957 04/30/17 2001  VITAMINB12 1,521*  --   FOLATE  --  23.7  FERRITIN 2,091*  --   TIBC 256  --   IRON 55  --   RETICCTPCT 5.5*  --    Urine analysis:    Component Value Date/Time   COLORURINE AMBER (A) 04/30/2017 1459   APPEARANCEUR HAZY (A) 04/30/2017 1459   LABSPEC 1.011 04/30/2017 1459   PHURINE 5.0 04/30/2017 1459   GLUCOSEU NEGATIVE 04/30/2017 1459   HGBUR LARGE (A) 04/30/2017 1459   BILIRUBINUR NEGATIVE 04/30/2017 1459   KETONESUR NEGATIVE 04/30/2017 1459   PROTEINUR 100 (A) 04/30/2017 1459   NITRITE NEGATIVE 04/30/2017 1459   LEUKOCYTESUR NEGATIVE 04/30/2017 1459   Sepsis Labs: (procalcitonin:4,lacticidven:4)  )No results found for this or any previous visit (from the past 240 hour(s)).    Radiology Studies: Dg Chest 2 View  Result Date: 04/30/2017 CLINICAL DATA:  Fever. No chest pain or SOB. Recent CABG x 4 - April 18, 2017. EXAM: CHEST  2 VIEW COMPARISON:  04/27/2017 FINDINGS: Lung base opacity present on the prior study has improved. There is mild residual atelectasis at the left lung base. Lungs are otherwise clear. Minimal pleural effusions have decreased from prior exam. No pneumothorax. Changes from the recent CABG surgery are noted. The cardiac silhouette is mildly enlarged. No mediastinal or hilar masses or evidence of adenopathy. Skeletal structures are grossly intact. IMPRESSION: No acute cardiopulmonary disease. Electronically Signed   By: Amie Portland M.D.   On: 04/30/2017 16:04   Ct Abdomen Pelvis W Contrast  Result Date:  04/30/2017 CLINICAL DATA:  Abdominal pain with nausea and vomiting. EXAM: CT ABDOMEN AND PELVIS WITH CONTRAST TECHNIQUE: Multidetector CT imaging of the abdomen and pelvis was  performed using the standard protocol following bolus administration of intravenous contrast. CONTRAST:  80mL ISOVUE-300 IOPAMIDOL (ISOVUE-300) INJECTION 61% COMPARISON:  None. FINDINGS: Lower chest: Linear atelectasis over the right middle lobe and lingula. Small bilateral pleural effusions with associated atelectasis. Median sternotomy wires are present. Calcification of the mitral valve annulus. Hepatobiliary: Previous cholecystectomy. Liver and biliary tree are normal. Pancreas: Within normal. Spleen: Within normal. Adrenals/Urinary Tract: Adrenal glands are normal. Kidneys are normal in size with a couple small bilateral nonobstructing stones. There are 2 small 1 cm hypodensities over the left renal cortex too small to characterize but likely cysts. Ureters are normal. Mild bladder distention which is otherwise unremarkable. Stomach/Bowel: Stomach is within normal. Appendix is normal. Colon is unremarkable. Vascular/Lymphatic: Mild calcified plaque of the abdominal aorta and iliac arteries. No adenopathy. Reproductive: Within normal. Other: No significant free fluid or focal inflammatory change. Left inguinal hernia containing only mesenteric fat. Musculoskeletal: Minimal degenerate change of the spine and hips. IMPRESSION: No acute findings in the abdomen/pelvis. Minimal bilateral nephrolithiasis.  No obstructing stones. Two 1 cm left renal cortical hypodensities too small to characterize, but likely cysts. Small bilateral pleural effusions with bibasilar atelectasis. Aortic Atherosclerosis (ICD10-I70.0). Electronically Signed   By: Elberta Fortis M.D.   On: 04/30/2017 18:18   US Abdomen Limited Ruq  Result Date: 04/30/2017 CLINICAL DATA:  Jaundice, SIRS, hyperbilirubinemia. EXAM: ULTRASOUND ABDOMEN LIMITED RIGHT UPPER QUADRANT  COMPARISON:  CT earlier today. FINDINGS: Gallbladder: Previous cholecystectomy. Common bile duct: Diameter: 7.4 mm. Liver: No focal lesion identified. Within normal limits in parenchymal echogenicity. Portal vein is patent on color Doppler imaging with normal direction of blood flow towards the liver. Small right pleural effusion. IMPRESSION: No acute hepatobiliary disease. Small right pleural effusion. Electronically Signed   By: Elberta Fortis M.D.   On: 04/30/2017 21:21     Scheduled Meds: . aspirin  325 mg Oral Daily  . atorvastatin  80 mg Oral q1800  . carvedilol  6.25 mg Oral BID WC  . cholecalciferol  2,000 Units Oral Daily  . darifenacin  15 mg Oral Daily  . furosemide  40 mg Oral Daily  . insulin aspart  0-5 Units Subcutaneous QHS  . insulin aspart  0-9 Units Subcutaneous TID WC  . insulin aspart  2 Units Subcutaneous TID WC  . insulin detemir  25 Units Subcutaneous QHS  . potassium chloride SA  20 mEq Oral BID  . sodium chloride flush  3 mL Intravenous Q12H   Continuous Infusions: . sodium chloride Stopped (04/30/17 1813)  . sodium chloride    . piperacillin-tazobactam (ZOSYN)  IV Stopped (05/01/17 1459)     LOS: 1 day    Time spent: 30 min    Renae Fickle, MD [facility] Pager [phone]  If 7PM-7AM, please contact night-coverage [email or web address] Password [email or web address] 05/01/2017, 2:40 PM

## 2017-05-01 NOTE — Progress Notes (Signed)
Spoke with blood bank for the second time today. They are having difficulty matching the patient's blood due to antibodies. Spoke with Dr. Malachi Bonds, okay to hold off on transfusion until all test are back and appropriate blood is matched for patient.

## 2017-05-01 NOTE — Progress Notes (Signed)
Called blood bank to check on the status of patient's unit of RBC; blood bank stated they were still in the process of matching for unit. Blood bank will notify when unit is ready.

## 2017-05-01 NOTE — NC FL2 (Signed)
[place] MEDICAID FL2 LEVEL OF CARE SCREENING TOOL     IDENTIFICATION  Patient Name: Andrea [name] Birthdate: 1946/02/24 Sex: female Admission Date (Current Location): 04/30/2017  [facility] and [place] Number:  Producer, television/film/video and Address:  [facility], [address], [place], [place] [address]      Provider Number: [id number]  Attending Physician Name and Address:  Renae Fickle, MD  Relative Name and Phone Number:       Current Level of Care: Hospital Recommended Level of Care: Skilled Nursing Facility Prior Approval Number:    Date Approved/Denied:   PASRR Number: [id number]  Discharge Plan: SNF    Current Diagnoses: Patient Active Problem List   Diagnosis Date Noted  . Pressure injury of skin 05/01/2017  . CAD (coronary artery disease) 04/30/2017  . SIRS (systemic inflammatory response syndrome) (HCC) 04/30/2017  . Symptomatic anemia 04/30/2017  . Hyperbilirubinemia 04/30/2017  . AKI (acute kidney injury) (HCC) 04/30/2017  . Chronic diastolic CHF (congestive heart failure) (HCC) 04/30/2017  . History of CVA in adulthood 04/30/2017  . Fever in adult   . Hypokalemia   . Jaundice   . Hx of CABG 04/18/2017  . Non-ST elevation (NSTEMI) myocardial infarction (HCC)   . Unstable angina (HCC) 04/16/2017  . Hyperlipidemia 03/14/2016  . Hypertensive heart disease 12/17/2015  . Diabetes mellitus, type II, insulin dependent (HCC) 01/04/2012  . Hypertension   . Stroke [facility])     Orientation RESPIRATION BLADDER Height & Weight     Self, Time, Situation, Place  O2 (Nasal Cannula; 2L) Incontinent Weight: 171 lb 4.8 oz (77.7 kg) Height:     BEHAVIORAL SYMPTOMS/MOOD NEUROLOGICAL BOWEL NUTRITION STATUS      Continent  (Please see d/c summary)  AMBULATORY STATUS COMMUNICATION OF NEEDS Skin   Limited Assist Verbally PU Stage and Appropriate Care, Surgical wounds (Closed incision chest, no dressing. Closed incision right leg, no dressing.  Closed incision left leg, no dressing.)   PU Stage 2 Dressing:  (Located on Sacrum, Foam Dressing, PRN)                   Personal Care Assistance Level of Assistance  Bathing, Feeding, Dressing Bathing Assistance: Limited assistance Feeding assistance: Independent Dressing Assistance: Limited assistance     Functional Limitations Info  Sight, Hearing, Speech Sight Info: Impaired Hearing Info: Adequate Speech Info: Adequate    SPECIAL CARE FACTORS FREQUENCY  PT (By licensed PT), OT (By licensed OT)     PT Frequency: 5x OT Frequency: 5x            Contractures Contractures Info: Not present    Additional Factors Info  Code Status, Allergies Code Status Info: Full Code Allergies Info: No known allergies           Current Medications (05/01/2017):  This is the current hospital active medication list Current Facility-Administered Medications  Medication Dose Route Frequency Provider Last Rate Last Dose  . 0.9 %  sodium chloride infusion   Intravenous Once Shaune Pollack, MD   Stopped at 04/30/17 1813  . 0.9 %  sodium chloride infusion  250 mL Intravenous PRN Opyd, Lavone Neri, MD      . acetaminophen (TYLENOL) tablet 650 mg  650 mg Oral Q6H PRN Opyd, Lavone Neri, MD       Or  . acetaminophen (TYLENOL) suppository 650 mg  650 mg Rectal Q6H PRN Opyd, Lavone Neri, MD      . aspirin EC tablet  325 mg  325 mg Oral Daily Opyd, Lavone Neri, MD   325 mg at 05/01/17 0849  . atorvastatin (LIPITOR) tablet 80 mg  80 mg Oral q1800 Opyd, Lavone Neri, MD      . bisacodyl (DULCOLAX) EC tablet 5 mg  5 mg Oral Daily PRN Opyd, Lavone Neri, MD      . carvedilol (COREG) tablet 6.25 mg  6.25 mg Oral BID WC Opyd, Lavone Neri, MD   6.25 mg at 05/01/17 0849  . cholecalciferol (VITAMIN D) tablet 2,000 Units  2,000 Units Oral Daily Opyd, Lavone Neri, MD   2,000 Units at 05/01/17 0849  . darifenacin (ENABLEX) 24 hr tablet 15 mg  15 mg Oral Daily Opyd, Lavone Neri, MD   15 mg at 05/01/17 0850  . furosemide  (LASIX) tablet 40 mg  40 mg Oral Daily Opyd, Lavone Neri, MD   40 mg at 05/01/17 0849  . HYDROcodone-acetaminophen (NORCO/VICODIN) 5-325 MG per tablet 1-2 tablet  1-2 tablet Oral Q4H PRN Opyd, Lavone Neri, MD      . insulin aspart (novoLOG) injection 0-5 Units  0-5 Units Subcutaneous QHS Opyd, Timothy S, MD      . insulin aspart (novoLOG) injection 0-9 Units  0-9 Units Subcutaneous TID WC Opyd, Lavone Neri, MD   1 Units at 05/01/17 0850  . insulin aspart (novoLOG) injection 2 Units  2 Units Subcutaneous TID WC Opyd, Lavone Neri, MD   2 Units at 05/01/17 0850  . insulin detemir (LEVEMIR) injection 25 Units  25 Units Subcutaneous QHS Opyd, Lavone Neri, MD      . ondansetron (ZOFRAN) tablet 4 mg  4 mg Oral Q6H PRN Opyd, Lavone Neri, MD       Or  . ondansetron (ZOFRAN) injection 4 mg  4 mg Intravenous Q6H PRN Opyd, Lavone Neri, MD      . piperacillin-tazobactam (ZOSYN) IVPB 3.375 g  3.375 g Intravenous Oneida Alar, MD   Stopped at 05/01/17 0335  . potassium chloride SA (K-DUR,KLOR-CON) CR tablet 20 mEq  20 mEq Oral BID Opyd, Lavone Neri, MD   20 mEq at 05/01/17 0849  . senna-docusate (Senokot-S) tablet 1 tablet  1 tablet Oral QHS PRN Opyd, Lavone Neri, MD      . sodium chloride flush (NS) 0.9 % injection 3 mL  3 mL Intravenous Q12H Opyd, Lavone Neri, MD   3 mL at 05/01/17 0851  . sodium chloride flush (NS) 0.9 % injection 3 mL  3 mL Intravenous PRN Opyd, Lavone Neri, MD      . traMADol Janean Sark) tablet 100 mg  100 mg Oral Q6H PRN Opyd, Lavone Neri, MD   100 mg at 04/30/17 2047     Discharge Medications: Please see discharge summary for a list of discharge medications.  Relevant Imaging Results:  Relevant Lab Results:   Additional Information SSN: [id number]  Maree Krabbe, LCSW

## 2017-05-01 NOTE — Clinical Social Work Note (Signed)
Clinical Social Work Assessment  Patient Details  Name: Andrea [name] MRN: [id number] Date of Birth: April 27, 1946  Date of referral:  05/01/17               Reason for consult:  Facility Placement                Permission sought to share information with:  Family Supports Permission granted to share information::  Yes, Verbal Permission Granted  Name::     Solicitor::     Relationship::  Daughter  Contact Information:     Housing/Transportation Living arrangements for the past 2 months:  Skilled Building surveyor of Information:  Patient, Adult Children Patient Interpreter Needed:  None Criminal Activity/Legal Involvement Pertinent to Current Situation/Hospitalization:  No - Comment as needed Significant Relationships:  Adult Children Lives with:  Facility Resident Do you feel safe going back to the place where you live?    Need for family participation in patient care:     Care giving concerns:  Pt's daughter present at bedside during assessment.   Social Worker assessment / plan:  CSW spoke with pt at bedside. Pt is from [facility]. Plan is for pt to return to [facility] at d/c. Per daughter MD told them 24-48 hours. Pt's family did not do a bed hold. CSW updated facility.   Employment status:  Retired Database administrator PT Recommendations:  Skilled Nursing Facility Information / Referral to community resources:  Skilled Nursing Facility   Patient/Family's Response to care:  Pt verbalized understanding of CSW role and expressed appreciation for support. Pt denies any concern regarding pt care at this time.   Patient/Family's Understanding of and Emotional Response to Diagnosis, Current Treatment, and Prognosis:  Pt understanding and realistic regarding physical limitations. Pt understands the need for SNF placement at d/c. Pt agreeable to SNF placement at d/c, at this time. Pt's responses emotionally appropriate during conversation with CSW.  Pt denies any concern regarding treatment plan at this time. CSW will continue to provide support and facilitate d/c needs.   Emotional Assessment Appearance:  Appears stated age Attitude/Demeanor/Rapport:   (Patient was appropriate) Affect (typically observed):  Accepting, Appropriate, Calm Orientation:  Oriented to Self, Oriented to  Time, Oriented to Place, Oriented to Situation Alcohol / Substance use:  Not Applicable Psych involvement (Current and /or in the community):  No (Comment)  Discharge Needs  Concerns to be addressed:  No discharge needs identified Readmission within the last 30 days:  Yes Current discharge risk:  Dependent with Mobility Barriers to Discharge:  Continued Medical Work up   [employer], LCSW 05/01/2017, 11:37 AM

## 2017-05-02 ENCOUNTER — Inpatient Hospital Stay (HOSPITAL_COMMUNITY): Payer: Medicare Other

## 2017-05-02 ENCOUNTER — Encounter (HOSPITAL_COMMUNITY): Payer: Self-pay | Admitting: General Practice

## 2017-05-02 DIAGNOSIS — I340 Nonrheumatic mitral (valve) insufficiency: Secondary | ICD-10-CM

## 2017-05-02 LAB — CBC
HEMATOCRIT: 20.4 % — AB (ref 36.0–46.0)
HEMOGLOBIN: 6.6 g/dL — AB (ref 12.0–15.0)
MCH: 26.7 pg (ref 26.0–34.0)
MCHC: 32.4 g/dL (ref 30.0–36.0)
MCV: 82.6 fL (ref 78.0–100.0)
Platelets: 120 10*3/uL — ABNORMAL LOW (ref 150–400)
RBC: 2.47 MIL/uL — ABNORMAL LOW (ref 3.87–5.11)
RDW: 16.5 % — AB (ref 11.5–15.5)
WBC: 6.1 10*3/uL (ref 4.0–10.5)

## 2017-05-02 LAB — COMPREHENSIVE METABOLIC PANEL
ALBUMIN: 2.3 g/dL — AB (ref 3.5–5.0)
ALK PHOS: 66 U/L (ref 38–126)
ALT: 94 U/L — ABNORMAL HIGH (ref 14–54)
ANION GAP: 7 (ref 5–15)
AST: 160 U/L — ABNORMAL HIGH (ref 15–41)
BUN: 27 mg/dL — AB (ref 6–20)
CHLORIDE: 104 mmol/L (ref 101–111)
CO2: 24 mmol/L (ref 22–32)
Calcium: 8.3 mg/dL — ABNORMAL LOW (ref 8.9–10.3)
Creatinine, Ser: 1.61 mg/dL — ABNORMAL HIGH (ref 0.44–1.00)
GFR calc Af Amer: 36 mL/min — ABNORMAL LOW (ref >60)
GFR calc non Af Amer: 31 mL/min — ABNORMAL LOW (ref >60)
GLUCOSE: 132 mg/dL — AB (ref 65–99)
POTASSIUM: 3.2 mmol/L — AB (ref 3.5–5.1)
SODIUM: 135 mmol/L (ref 135–145)
Total Bilirubin: 1.9 mg/dL — ABNORMAL HIGH (ref 0.3–1.2)
Total Protein: 5.7 g/dL — ABNORMAL LOW (ref 6.5–8.1)

## 2017-05-02 LAB — GLUCOSE, CAPILLARY
GLUCOSE-CAPILLARY: 116 mg/dL — AB (ref 65–99)
GLUCOSE-CAPILLARY: 153 mg/dL — AB (ref 65–99)
GLUCOSE-CAPILLARY: 138 mg/dL — AB (ref 65–99)
GLUCOSE-CAPILLARY: 192 mg/dL — AB (ref 65–99)
GLUCOSE-CAPILLARY: 261 mg/dL — AB (ref 65–99)

## 2017-05-02 LAB — ECHOCARDIOGRAM COMPLETE: Weight: 2740.76 oz

## 2017-05-02 LAB — PROTIME-INR
INR: 1.21
Prothrombin Time: 15.2 seconds (ref 11.4–15.2)

## 2017-05-02 LAB — HAPTOGLOBIN

## 2017-05-02 LAB — DIRECT ANTIGLOBULIN TEST (NOT AT ARMC)
DAT, IGG: NEGATIVE
DAT, complement: NEGATIVE

## 2017-05-02 LAB — LACTATE DEHYDROGENASE: LDH: 724 U/L — AB (ref 98–192)

## 2017-05-02 LAB — PROCALCITONIN: PROCALCITONIN: 0.35 ng/mL

## 2017-05-02 LAB — UREA NITROGEN, URINE: Urea Nitrogen, Ur: 384 mg/dL

## 2017-05-02 MED ORDER — POTASSIUM CHLORIDE CRYS ER 20 MEQ PO TBCR
20.0000 meq | EXTENDED_RELEASE_TABLET | Freq: Two times a day (BID) | ORAL | Status: DC
  Administered 2017-05-02 – 2017-05-05 (×6): 20 meq via ORAL
  Filled 2017-05-02 (×6): qty 1

## 2017-05-02 NOTE — Progress Notes (Signed)
PROGRESS NOTE  Andrea [name]  ZOX:[id number] DOB: 09-Jun-1946 DOA: 04/30/2017 PCP: Andrea Slimmer, MD  Brief Narrative:   Andrea [name] is a 71 y.o. female with medical history significant for insulin-dependent diabetes mellitus, hypertension, chronic diastolic CHF, and coronary artery disease status post CABG on 04/18/2017, now presenting to the emergency department from her SNF for evaluation of fevers with generalized weakness and lethargy. Patient was discharged from the hospital to SNF on 04/28/2017 and reports that she was starting to have fevers at that time. She has continued to have fevers at the nursing facility that a been treated with Tylenol. She reports generalized weakness and fatigue, worsening over the past several days. She denies rhinorrhea, sore throat, cough, dyspnea, abdominal pain, nausea, vomiting, or diarrhea. She reports some suprapubic discomfort but denies abdominal or flank pain.  No rash is reported and her incision sites are said to be healing appropriately with no sign of infection. She denies neck stiffness, headache, change in vision or hearing, or focal numbness or weakness. Denies any significant swelling in her lower extremities and denies chest pain or palpitations.  In the ER, she was febrile to 38 C, saturating 90% on room air.  Chest x-ray negative for acute cardio pulmonary disease. CT of the abdomen and pelvis and RUQ US unremarkable.  She has an indirect hyperbilirubinemia suggestive of hemolysis and her hemoglobin has drifted down.  She also has a mild increase in her creatinine.   Her right leg incision may have some slight surrounding erythema, but very subtle.  Duplex lower extremities negative.    Assessment & Plan:   Principal Problem:   Symptomatic anemia Active Problems:   Hypertension   Diabetes mellitus, type II, insulin dependent (HCC)   CAD (coronary artery disease)   SIRS (systemic inflammatory response syndrome) (HCC)  Hyperbilirubinemia   AKI (acute kidney injury) (HCC)   Chronic diastolic CHF (congestive heart failure) (HCC)   History of CVA in adulthood   Fever in adult   Hypokalemia   Jaundice   Pressure injury of skin  Symptomatic anemia (fatigue/lethargy), due to hemolysis post-op (which would explain her indirect hyperbili and LFTs and elevated LDH) and some iron deficiency.  I suspect she has delayed hemolytic transfusion reaction which can occur between 1-2 weeks post-transfusion and presents with fevers, jaundice, hemolysis, and new antiglobulin detection by the blood bank.  It appears the patient initially tested positive for anti-E prior to surgery and subsequently is positive for anti-E and anti-c -  Direct coombs was negative -  Transfusing 1 unit PRBC today, second unit is ready if needed in AM -  Started oral iron -  Occult stool   SIRS, expected low grade fever from surgery or from hemolysis as above.  Initially thought that her right leg might have a mild cellulitis around the port site from her vein harvesting, but this appears unchanged despite 2 days of IV antibiotics I doubt this was infectious and is probably normal postoperative healing - Blood cx NGTD -  Urine cultures negative  -  D/c vancomycin and Zosyn since no obvious source identified at 48 hours - Lower extremity duplex negative - continue IS  2. CAD s/p recent CABG  - No anginal complaints  - Underwent CABG on 04/18/17  - Continue ASA, Lipitor, Coreg    4. Acute kidney injury, may be secondary to diuretics plus anemia or hemolysis.  Continuing to rise possibly due to anemia.  - SCr is 1.43 on  admission, up from 0.6 earlier this month  -  Holding lasix -  RUS and FENa -  Transfusing blood  5. Chronic diastolic CHF  -  Continuing to hold lasix due to AKI -  Repeat BMP in AM  6. Hypokalemia  -  Increase potassium supplementation  7. Insulin-dependent DM, well controlled  - A1c was 7.8% earlier this month  -  Managed at home with Levemir 30 units, Novolog 2-15 units, and Janumet  - Continue Levemir at 25 units qHS with Novolog 2 units TID  -  Continue SSI   DVT prophylaxis:  SCDs pending occult stool Code Status:  full Family Communication:  Patient and her son and daughter  Disposition Plan:  Likely back to SNF in 2 days   Consultants:   None  Procedures:  Duplex lower extremity  Antimicrobials:  Anti-infectives    Start     Dose/Rate Route Frequency Ordered Stop   05/01/17 1800  vancomycin (VANCOCIN) IVPB 1000 mg/200 mL premix  Status:  Discontinued     1,000 mg 200 mL/hr over 60 Minutes Intravenous Every 24 hours 04/30/17 1703 04/30/17 1958   04/30/17 2300  piperacillin-tazobactam (ZOSYN) IVPB 3.375 g     3.375 g 12.5 mL/hr over 240 Minutes Intravenous Every 8 hours 04/30/17 1703     04/30/17 1730  vancomycin (VANCOCIN) 1,500 mg in sodium chloride 0.9 % 500 mL IVPB     1,500 mg 250 mL/hr over 120 Minutes Intravenous  Once 04/30/17 1656 04/30/17 2011   04/30/17 1700  piperacillin-tazobactam (ZOSYN) IVPB 3.375 g     3.375 g 100 mL/hr over 30 Minutes Intravenous  Once 04/30/17 1646 05/01/17 0853   04/30/17 1700  vancomycin (VANCOCIN) IVPB 1000 mg/200 mL premix  Status:  Discontinued     1,000 mg 200 mL/hr over 60 Minutes Intravenous  Once 04/30/17 1646 04/30/17 1656       Subjective:  Feeling maybe a little better today. She still tired, but denies chest pains, shortness of breath, nausea.  Objective: Vitals:   05/02/17 0615 05/02/17 1503 05/02/17 1737 05/02/17 1758  BP: (!) 109/51 (!) 116/48 (!) 130/57 (!) 131/58  Pulse: 76 83 75 77  Resp: Temp: 98.8 F (37.1 C) 98.4 F (36.9 C) 98.4 F (36.9 C) 98.7 F (37.1 C)  TempSrc: Oral Oral Oral Oral  SpO2: 93% 94% 97% 97%  Weight:        Intake/Output Summary (Last 24 hours) at 05/02/17 1851 Last data filed at 05/02/17 1539  Gross per 24 hour  Intake              740 ml  Output             1550 ml    Net             -810 ml   Filed Weights   05/01/17 0500  Weight: 77.7 kg (171 lb 4.8 oz)    Examination:  General exam:  Adult Female, pale appearing.  No acute distress.  HEENT:  NCAT, MMM Respiratory system: Clear to auscultation bilaterally Cardiovascular system: Regular rate and rhythm, normal S1/S2. No murmurs, rubs, gallops or clicks.  Warm extremities.  Sternotomy scar appears to be healing well, no surrounding erythema, induration.. Bilateral lower extremity port incisions from vein harvesting both have a minimal amount of pinkness that appears unchanged despite 2 days of IV antibiotics. No fluctuance, purulence, induration Gastrointestinal system: Normal active bowel sounds, soft, nondistended, nontender. MSK:  Normal tone and bulk, no lower extremity edema Neuro:  Grossly intact   Data Reviewed: I have personally reviewed following labs and imaging studies  CBC:  Recent Labs Lab 04/30/17 1459 05/01/17 0552 05/02/17 0459  WBC 12.7* 11.0* 6.1  NEUTROABS 10.1* 8.6*  --   HGB 7.7* 7.3* 6.6*  HCT 22.6* 22.0* 20.4*  MCV 81.9 83.3 82.6  PLT 200 135* 120*   Basic Metabolic Panel:  Recent Labs Lab 04/30/17 1459 05/01/17 0552 05/02/17 0459  NA 134* 134* 135  K 3.3* 4.0 3.2*  CL 102 103 104  CO2 22 19* 24  GLUCOSE 93 136* 132*  BUN 26* 26* 27*  CREATININE 1.43* 1.45* 1.61*  CALCIUM 8.6* 8.5* 8.3*   GFR: Estimated Creatinine Clearance: 30.3 mL/min (A) (by C-G formula based on SCr of 1.61 mg/dL (H)). Liver Function Tests:  Recent Labs Lab 04/30/17 1459 04/30/17 1957 05/01/17 0552 05/02/17 0459  AST 90*  --  107* 160*  ALT 40  --  50 94*  ALKPHOS 72  --  75 66  BILITOT 3.9* 3.8* 3.2* 1.9*  PROT 6.4*  --  6.2* 5.7*  ALBUMIN 2.6*  --  2.5* 2.3*   No results for input(s): LIPASE, AMYLASE in the last 168 hours. No results for input(s): AMMONIA in the last 168 hours. Coagulation Profile:  Recent Labs Lab 05/02/17 0459  INR 1.21   Cardiac  Enzymes: No results for input(s): CKTOTAL, CKMB, CKMBINDEX, TROPONINI in the last 168 hours. BNP (last 3 results) No results for input(s): PROBNP in the last 8760 hours. HbA1C: No results for input(s): HGBA1C in the last 72 hours. CBG:  Recent Labs Lab 05/01/17 1614 05/01/17 2124 05/02/17 0804 05/02/17 1145 05/02/17 1313  GLUCAP 235* 219* 116* 153* 138*   Lipid Profile: No results for input(s): CHOL, HDL, LDLCALC, TRIG, CHOLHDL, LDLDIRECT in the last 72 hours. Thyroid Function Tests: No results for input(s): TSH, T4TOTAL, FREET4, T3FREE, THYROIDAB in the last 72 hours. Anemia Panel:  Recent Labs  04/30/17 1957 04/30/17 2001  VITAMINB12 1,521*  --   FOLATE  --  23.7  FERRITIN 2,091*  --   TIBC 256  --   IRON 55  --   RETICCTPCT 5.5*  --    Urine analysis:    Component Value Date/Time   COLORURINE AMBER (A) 04/30/2017 1459   APPEARANCEUR HAZY (A) 04/30/2017 1459   LABSPEC 1.011 04/30/2017 1459   PHURINE 5.0 04/30/2017 1459   GLUCOSEU NEGATIVE 04/30/2017 1459   HGBUR LARGE (A) 04/30/2017 1459   BILIRUBINUR NEGATIVE 04/30/2017 1459   KETONESUR NEGATIVE 04/30/2017 1459   PROTEINUR 100 (A) 04/30/2017 1459   NITRITE NEGATIVE 04/30/2017 1459   LEUKOCYTESUR NEGATIVE 04/30/2017 1459   Sepsis Labs: (procalcitonin:4,lacticidven:4)  ) Recent Results (from the past 240 hour(s))  Urine culture     Status: None   Collection Time: 04/30/17  3:04 PM  Result Value Ref Range Status   Specimen Description URINE, CATHETERIZED  Final   Special Requests NONE  Final   Culture NO GROWTH  Final   Report Status 05/01/2017 FINAL  Final  Blood culture (routine x 2)     Status: None (Preliminary result)   Collection Time: 04/30/17  3:25 PM  Result Value Ref Range Status   Specimen Description BLOOD LEFT HAND  Final   Special Requests   Final    BOTTLES DRAWN AEROBIC AND ANAEROBIC Blood Culture adequate volume   Culture NO GROWTH 2 DAYS  Final   Report  Status PENDING   Incomplete  Blood culture (routine x 2)     Status: None (Preliminary result)   Collection Time: 04/30/17  4:21 PM  Result Value Ref Range Status   Specimen Description BLOOD RIGHT HAND  Final   Special Requests   Final    BOTTLES DRAWN AEROBIC AND ANAEROBIC Blood Culture adequate volume   Culture NO GROWTH 2 DAYS  Final   Report Status PENDING  Incomplete      Radiology Studies: US Abdomen Limited Ruq  Result Date: 04/30/2017 CLINICAL DATA:  Jaundice, SIRS, hyperbilirubinemia. EXAM: ULTRASOUND ABDOMEN LIMITED RIGHT UPPER QUADRANT COMPARISON:  CT earlier today. FINDINGS: Gallbladder: Previous cholecystectomy. Common bile duct: Diameter: 7.4 mm. Liver: No focal lesion identified. Within normal limits in parenchymal echogenicity. Portal vein is patent on color Doppler imaging with normal direction of blood flow towards the liver. Small right pleural effusion. IMPRESSION: No acute hepatobiliary disease. Small right pleural effusion. Electronically Signed   By: Elberta Fortis M.D.   On: 04/30/2017 21:21     Scheduled Meds: . aspirin  325 mg Oral Daily  . atorvastatin  80 mg Oral q1800  . carvedilol  6.25 mg Oral BID WC  . cholecalciferol  2,000 Units Oral Daily  . darifenacin  15 mg Oral Daily  . ferrous sulfate  325 mg Oral BID WC  . insulin aspart  0-15 Units Subcutaneous TID WC  . insulin aspart  0-5 Units Subcutaneous QHS  . insulin aspart  2 Units Subcutaneous TID WC  . insulin detemir  25 Units Subcutaneous QHS  . potassium chloride SA  20 mEq Oral Daily  . sodium chloride flush  3 mL Intravenous Q12H   Continuous Infusions: . sodium chloride Stopped (04/30/17 1813)  . sodium chloride    . piperacillin-tazobactam (ZOSYN)  IV Stopped (05/02/17 1113)     LOS: 2 days    Time spent: 30 min    Renae Fickle, MD [facility] Pager [phone]  If 7PM-7AM, please contact night-coverage [email or web address] Password [facility] 05/02/2017, 6:51 PM

## 2017-05-02 NOTE — Progress Notes (Addendum)
CRITICAL VALUE ALERT  Critical Value: hgb 6.6  Date & Time Notied: 05/02/17  Provider Notified: amion text paged  Orders Received/Actions taken: Patient currently has blood on hold, awaiting final antibody screening from [facility] per Blood Bank

## 2017-05-02 NOTE — Progress Notes (Signed)
  Subjective: Patient readmitted with altered mental status, low-grade fever, postoperative anemia. Chest x-ray and CT scan images personally reviewed and show no evidence of CHF or pneumonia. Sternal and leg incisions healing well. Probably some element of dehydration with rising BUN and creatinine. Lasix has been stopped. The patient feels better after admission. Echocardiogram is pending. Objective: Vital signs in last 24 hours: Temp:  [97.8 F (36.6 C)-99.4 F (37.4 C)] 98.8 F (37.1 C) (09/25 0615) Pulse Rate:  [76-78] 76 (09/25 0615) Resp:  [18-126] 18 (09/25 0615) BP: (109-112)/(51-73) 109/51 (09/25 0615) SpO2:  [93 %-98 %] 93 % (09/25 0615)  Hemodynamic parameters for last 24 hours:  sinus rhythm  Intake/Output from previous day: 09/24 0701 - 09/25 0700 In: 590 [P.O.:540; IV Piggyback:50] Out: 1350 [Urine:1350] Intake/Output this shift: No intake/output data recorded.       Exam    General- alert and comfortable   Lungs- clear without rales, wheezes   Cor- regular rate and rhythm, no murmur , gallop   Abdomen- soft, non-tender   Extremities - warm, non-tender, minimal edema   Neuro- oriented, appropriate, no focal weakness   Lab Results:  Recent Labs  05/01/17 0552 05/02/17 0459  WBC 11.0* 6.1  HGB 7.3* 6.6*  HCT 22.0* 20.4*  PLT 135* 120*   BMET:  Recent Labs  05/01/17 0552 05/02/17 0459  NA 134* 135  K 4.0 3.2*  CL 103 104  CO2 19* 24  GLUCOSE 136* 132*  BUN 26* 27*  CREATININE 1.45* 1.61*  CALCIUM 8.5* 8.3*    PT/INR:  Recent Labs  05/02/17 0459  LABPROT 15.2  INR 1.21   ABG    Component Value Date/Time   PHART 7.507 (H) 04/22/2017 0315   HCO3 30.3 (H) 04/22/2017 0315   TCO2 29 04/21/2017 1639   ACIDBASEDEF 1.0 04/19/2017 2221   O2SAT 53.2 04/22/2017 0450   CBG (last 3)   Recent Labs  05/01/17 1614 05/01/17 2124 05/02/17 0804  GLUCAP 235* 219* 116*    Assessment/Plan: S/P  Agree with plans to transfuse to hemoglobin  greater than 8.5 Will follow  LOS: 2 days    Kathlee Nations Trigt III 05/02/2017

## 2017-05-02 NOTE — Progress Notes (Signed)
  Echocardiogram 2D Echocardiogram has been performed.  Janalyn Harder 05/02/2017, 9:57 AM

## 2017-05-02 NOTE — Progress Notes (Signed)
Patient had a zosyn due at 1500 and I asked SWOT to attempt to get an additional IV because patient only had one and we got confirmation that the blood transfusion was ready and her zosyn(that runs over 4 hours) was due at same time. They were unsuccessful and consulted IV team. IV team was unsuccessful, at this point I opted to give the blood first because it was priority. At that point I paged MD and let her know of the issue and she said just give 1500 zosyn after blood is finished and we will reschedule the next doses. Will continue to monitor.

## 2017-05-03 ENCOUNTER — Encounter: Payer: Self-pay | Admitting: Interventional Cardiology

## 2017-05-03 DIAGNOSIS — I2510 Atherosclerotic heart disease of native coronary artery without angina pectoris: Secondary | ICD-10-CM

## 2017-05-03 DIAGNOSIS — Z794 Long term (current) use of insulin: Secondary | ICD-10-CM

## 2017-05-03 DIAGNOSIS — E119 Type 2 diabetes mellitus without complications: Secondary | ICD-10-CM

## 2017-05-03 DIAGNOSIS — R509 Fever, unspecified: Secondary | ICD-10-CM

## 2017-05-03 DIAGNOSIS — I10 Essential (primary) hypertension: Secondary | ICD-10-CM

## 2017-05-03 DIAGNOSIS — I5032 Chronic diastolic (congestive) heart failure: Secondary | ICD-10-CM

## 2017-05-03 DIAGNOSIS — N179 Acute kidney failure, unspecified: Secondary | ICD-10-CM

## 2017-05-03 DIAGNOSIS — I2583 Coronary atherosclerosis due to lipid rich plaque: Secondary | ICD-10-CM

## 2017-05-03 LAB — COMPREHENSIVE METABOLIC PANEL
ALK PHOS: 67 U/L (ref 38–126)
ALT: 103 U/L — ABNORMAL HIGH (ref 14–54)
ANION GAP: 5 (ref 5–15)
AST: 146 U/L — AB (ref 15–41)
Albumin: 2.4 g/dL — ABNORMAL LOW (ref 3.5–5.0)
BILIRUBIN TOTAL: 1.9 mg/dL — AB (ref 0.3–1.2)
BUN: 18 mg/dL (ref 6–20)
CALCIUM: 8.5 mg/dL — AB (ref 8.9–10.3)
CO2: 25 mmol/L (ref 22–32)
Chloride: 106 mmol/L (ref 101–111)
Creatinine, Ser: 1.49 mg/dL — ABNORMAL HIGH (ref 0.44–1.00)
GFR calc Af Amer: 40 mL/min — ABNORMAL LOW (ref >60)
GFR, EST NON AFRICAN AMERICAN: 34 mL/min — AB (ref >60)
Glucose, Bld: 171 mg/dL — ABNORMAL HIGH (ref 65–99)
POTASSIUM: 3.5 mmol/L (ref 3.5–5.1)
Sodium: 136 mmol/L (ref 135–145)
TOTAL PROTEIN: 5.9 g/dL — AB (ref 6.5–8.1)

## 2017-05-03 LAB — BASIC METABOLIC PANEL
Anion gap: 6 (ref 5–15)
BUN: 21 mg/dL — ABNORMAL HIGH (ref 6–20)
CO2: 23 mmol/L (ref 22–32)
Calcium: 8.4 mg/dL — ABNORMAL LOW (ref 8.9–10.3)
Chloride: 105 mmol/L (ref 101–111)
Creatinine, Ser: 1.46 mg/dL — ABNORMAL HIGH (ref 0.44–1.00)
GFR calc Af Amer: 41 mL/min — ABNORMAL LOW (ref >60)
GFR calc non Af Amer: 35 mL/min — ABNORMAL LOW (ref >60)
Glucose, Bld: 257 mg/dL — ABNORMAL HIGH (ref 65–99)
Potassium: 3.4 mmol/L — ABNORMAL LOW (ref 3.5–5.1)
Sodium: 134 mmol/L — ABNORMAL LOW (ref 135–145)

## 2017-05-03 LAB — CBC
HCT: 25.3 % — ABNORMAL LOW (ref 36.0–46.0)
Hemoglobin: 8.1 g/dL — ABNORMAL LOW (ref 12.0–15.0)
MCH: 27 pg (ref 26.0–34.0)
MCHC: 32 g/dL (ref 30.0–36.0)
MCV: 84.3 fL (ref 78.0–100.0)
Platelets: 122 10*3/uL — ABNORMAL LOW (ref 150–400)
RBC: 3 MIL/uL — ABNORMAL LOW (ref 3.87–5.11)
RDW: 16.5 % — AB (ref 11.5–15.5)
WBC: 5.8 10*3/uL (ref 4.0–10.5)

## 2017-05-03 LAB — CREATININE, URINE, RANDOM: Creatinine, Urine: 84.72 mg/dL

## 2017-05-03 LAB — GLUCOSE, CAPILLARY
GLUCOSE-CAPILLARY: 225 mg/dL — AB (ref 65–99)
Glucose-Capillary: 136 mg/dL — ABNORMAL HIGH (ref 65–99)
Glucose-Capillary: 152 mg/dL — ABNORMAL HIGH (ref 65–99)
Glucose-Capillary: 259 mg/dL — ABNORMAL HIGH (ref 65–99)

## 2017-05-03 LAB — HEMOGLOBIN AND HEMATOCRIT, BLOOD
HCT: 25.4 % — ABNORMAL LOW (ref 36.0–46.0)
Hemoglobin: 8.3 g/dL — ABNORMAL LOW (ref 12.0–15.0)

## 2017-05-03 LAB — LACTATE DEHYDROGENASE: LDH: 597 U/L — AB (ref 98–192)

## 2017-05-03 LAB — PROTIME-INR
INR: 1.21
PROTHROMBIN TIME: 15.2 s (ref 11.4–15.2)

## 2017-05-03 LAB — SODIUM, URINE, RANDOM: Sodium, Ur: 30 mmol/L

## 2017-05-03 MED ORDER — BISACODYL 5 MG PO TBEC: 10.0000 mg | DELAYED_RELEASE_TABLET | Freq: Once | ORAL | Status: DC

## 2017-05-03 MED ORDER — BISACODYL 10 MG RE SUPP
10.0000 mg | Freq: Once | RECTAL | Status: AC
  Administered 2017-05-03: 10 mg via RECTAL
  Filled 2017-05-03: qty 1

## 2017-05-03 NOTE — Evaluation (Signed)
Physical Therapy Evaluation Patient Details Name: Andrea [name] MRN: [id number] DOB: [birth year] Today's Date: 05/03/2017   History of Present Illness  Pt is a 71 y/o female admitted from her SNF secondary to fever, AMS, and symptomatic anemia. Imaging negative for DVT and CT of abdomen negative for acute abnormality. Pt recently discharged on 9/21 following CABG. PMH includes HTN, DM, CAD, dCHF, NSTEMI s/p CABG X4, CVA, macular degeneration, and L rotator cuff repair.   Clinical Impression  Pt admitted secondary to problem above with deficits below. Pt recently d/c'd to SNF following CABG and was working with PT on ambulation with RW. Reported she needed some assist for bathing at facility. Upon eval, pt limited by weakness, fatigue, and decreased balance. Required cues for sternal precautions during session. Required min guard to min A during mobility and ambulation distance limited secondary to fatigue. Recommend return to SNF at d/c to increase independence and safety with functional mobility prior to return home. Will continue to follow acutely to progress mobility according to pt tolerance.     Follow Up Recommendations SNF    Equipment Recommendations  None recommended by PT    Recommendations for Other Services       Precautions / Restrictions Precautions Precautions: Sternal;Fall Precaution Comments: Pt able to recall 1/3 sternal precautions. Required VCs for rest of sternal precautions.  Restrictions Weight Bearing Restrictions: No      Mobility  Bed Mobility               General bed mobility comments: Pt sitting on toilet prior to session.   Transfers Overall transfer level: Needs assistance Equipment used: None Transfers: Sit to/from Stand Sit to Stand: Min guard         General transfer comment: Min guard for steadying assist. Demonstrated good hand placement on knees to maintain sternal precautions.   Ambulation/Gait Ambulation/Gait assistance: Min  guard;Min assist Ambulation Distance (Feet): 50 Feet Assistive device: 1 person hand held assist (IV pole ) Gait Pattern/deviations: Step-through pattern;Decreased step length - left;Decreased dorsiflexion - left Gait velocity: decreased Gait velocity interpretation: Below normal speed for age/gender General Gait Details: Min guard to occaisional min A for balance during gait. Slow, guarded gait. Required standing rest breaks X3 secondary to fatigue.   Stairs            Wheelchair Mobility    Modified Rankin (Stroke Patients Only)       Balance Overall balance assessment: Needs assistance Sitting-balance support: No upper extremity supported;Feet supported Sitting balance-Leahy Scale: Good     Standing balance support: Bilateral upper extremity supported;During functional activity Standing balance-Leahy Scale: Poor Standing balance comment: Reliant on UE support                              Pertinent Vitals/Pain Pain Assessment: No/denies pain    Home Living Family/patient expects to be discharged to:: Skilled nursing facility                      Prior Function Level of Independence: Needs assistance   Gait / Transfers Assistance Needed: Pt reports she was working with PT on walking using RW.   ADL's / Homemaking Assistance Needed: Reports she was able to assist with bathin at facility, however, requred assist with reaching her back.         Hand Dominance        Extremity/Trunk Assessment   Upper Extremity  Assessment Upper Extremity Assessment: Defer to OT evaluation    Lower Extremity Assessment Lower Extremity Assessment: Generalized weakness    Cervical / Trunk Assessment Cervical / Trunk Assessment: Kyphotic  Communication   Communication: No difficulties  Cognition Arousal/Alertness: Awake/alert Behavior During Therapy: WFL for tasks assessed/performed Overall Cognitive Status: Within Functional Limits for tasks  assessed                                        General Comments General comments (skin integrity, edema, etc.): VSS throughout session     Exercises     Assessment/Plan    PT Assessment Patient needs continued PT services  PT Problem List Decreased strength;Decreased mobility;Decreased activity tolerance;Decreased balance;Decreased knowledge of use of DME;Decreased knowledge of precautions;Cardiopulmonary status limiting activity       PT Treatment Interventions Gait training;Therapeutic exercise;Patient/family education;DME instruction;Therapeutic activities;Cognitive remediation;Stair training;Balance training;Functional mobility training    PT Goals (Current goals can be found in the Care Plan section)  Acute Rehab PT Goals Patient Stated Goal: To go back to SNF to continue getting stronger.  PT Goal Formulation: With patient Time For Goal Achievement: 05/10/17 Potential to Achieve Goals: Good    Frequency Min 2X/week   Barriers to discharge        Co-evaluation               AM-PAC PT "6 Clicks" Daily Activity  Outcome Measure Difficulty turning over in bed (including adjusting bedclothes, sheets and blankets)?: A Lot Difficulty moving from lying on back to sitting on the side of the bed? : Unable Difficulty sitting down on and standing up from a chair with arms (e.g., wheelchair, bedside commode, etc,.)?: Unable Help needed moving to and from a bed to chair (including a wheelchair)?: A Little Help needed walking in hospital room?: A Little Help needed climbing 3-5 steps with a railing? : A Lot 6 Click Score: 12    End of Session   Activity Tolerance: Patient tolerated treatment well Patient left: in chair;with call bell/phone within reach Nurse Communication: Mobility status PT Visit Diagnosis: Unsteadiness on feet (R26.81);Muscle weakness (generalized) (M62.81)    Time: 9604-5409 PT Time Calculation (min) (ACUTE ONLY): 18  min   Charges:   PT Evaluation $PT Eval Low Complexity: 1 Low     PT G Codes:        Gladys Damme, PT, DPT  Acute Rehabilitation Services  Pager: [phone]   Lehman Prom 05/03/2017, 11:15 AM

## 2017-05-03 NOTE — Progress Notes (Signed)
PROGRESS NOTE  Andrea [name]  ZOX:[id number] DOB: Mar 14, 1946 DOA: 04/30/2017 PCP: Laurena Slimmer, MD  Brief Narrative:   71 y.o. female with medical history significant for insulin-dependent diabetes mellitus, hypertension, chronic diastolic CHF, and coronary artery disease status post CABG on 04/18/2017, presented from her SNF for evaluation of fevers with generalized weakness and lethargy. Patient was discharged from the hospital to SNF on 04/28/2017 and reports that she was starting to have fevers at that time. She was found to be anemic with elevated bilirubin. It was thought that she was having fever secondary to delayed hemolytic reaction. She was transfused packed red cells. Cardiaothoracic surgery is following the patient  Assessment & Plan:   Principal Problem:   Symptomatic anemia Active Problems:   Hypertension   Diabetes mellitus, type II, insulin dependent (HCC)   CAD (coronary artery disease)   SIRS (systemic inflammatory response syndrome) (HCC)   Hyperbilirubinemia   AKI (acute kidney injury) (HCC)   Chronic diastolic CHF (congestive heart failure) (HCC)   History of CVA in adulthood   Fever in adult   Hypokalemia   Jaundice   Pressure injury of skin  Symptomatic anemia (fatigue/lethargy) - Probably due to delayed hemolytic transfusion reaction  -  Direct coombs was negative - Status post 2 units packed red cells transfusion since admission - Hemoglobin stable today. Repeat a.m. hemoglobin -  Continue oral iron  SIRS - expected low grade fever from surgery or from hemolysis as above. - Cultures negative. -  Monitor Off antibiotics. - Lower extremity duplex negative - continue incentive spirometry  2. CAD s/p recent CABG  - No anginal complaints  - Underwent CABG on 04/18/17  - Continue ASA, Lipitor, Coreg   - CT surgery follow-up appreciated  4. Acute kidney injury, may be secondary to diuretics plus anemia or hemolysis.   - Monitor creatinine.  Keep holding Lasix for now  5. Chronic diastolic CHF  -  Continuing to hold lasix due to AKI -  Repeat BMP in AM  6. Hypokalemia  -  Improving. Repeat a.m. labs  7. Insulin-dependent DM, well controlled  - A1c was 7.8% earlier this month  - Continue Levemir at 25 units qHS with Novolog 2 units TID  -  Continue SSI   DVT prophylaxis:  SCDs  Code Status:  full Family Communication:  None at bedside Disposition Plan:  SNF once bed is available   Consultants:   CT surgery  Procedures:  Duplex lower extremity on 04/30/2017 - No evidence of deep vein or superficial thrombosis involving the   right lower extremity and left lower extremity. - Venous pulsatility is present throughout the limb and may be   consistent with CHF or other cardiac etiologies expanding the   venous compartments.  Echo on 05/02/2017 - Left ventricle: The cavity size was normal. Wall thickness was   increased in a pattern of mild LVH. Systolic function was normal.   The estimated ejection fraction was in the range of 60% to 65%.   There is hypokinesis of the basalinferolateral myocardium.   Doppler parameters are consistent with abnormal left ventricular   relaxation (grade 1 diastolic dysfunction). Doppler parameters   are consistent with high ventricular filling pressure. - Mitral valve: Calcified annulus. There was mild regurgitation.  Impressions:  - Hypokinesis of the basal inferolateral wall with overall normal   LV systolic function; mild diastolic dysfunction; mild LVH; mild   MR and TR.  Antimicrobials:  Anti-infectives  Start     Dose/Rate Route Frequency Ordered Stop   05/01/17 1800  vancomycin (VANCOCIN) IVPB 1000 mg/200 mL premix  Status:  Discontinued     1,000 mg 200 mL/hr over 60 Minutes Intravenous Every 24 hours 04/30/17 1703 04/30/17 1958   04/30/17 2300  piperacillin-tazobactam (ZOSYN) IVPB 3.375 g     3.375 g 12.5 mL/hr over 240 Minutes Intravenous Every 8 hours  04/30/17 1703     04/30/17 1730  vancomycin (VANCOCIN) 1,500 mg in sodium chloride 0.9 % 500 mL IVPB     1,500 mg 250 mL/hr over 120 Minutes Intravenous  Once 04/30/17 1656 04/30/17 2011   04/30/17 1700  piperacillin-tazobactam (ZOSYN) IVPB 3.375 g     3.375 g 100 mL/hr over 30 Minutes Intravenous  Once 04/30/17 1646 05/01/17 0853   04/30/17 1700  vancomycin (VANCOCIN) IVPB 1000 mg/200 mL premix  Status:  Discontinued     1,000 mg 200 mL/hr over 60 Minutes Intravenous  Once 04/30/17 1646 04/30/17 1656       Subjective:Patient seen and examined at bedside. She feels much better. No overnight fever, nausea or vomiting  Objective: Vitals:   05/02/17 1758 05/02/17 1958 05/02/17 2101 05/03/17 0523  BP: (!) 131/58 (!) 128/58 (!) 129/57 (!) 126/50  Pulse: 77 80 82 67  Resp: Temp: 98.7 F (37.1 C) 98.9 F (37.2 C) 99.2 F (37.3 C) 98.3 F (36.8 C)  TempSrc: Oral Oral Oral Oral  SpO2: 97% 98% 93% 96%  Weight:        Intake/Output Summary (Last 24 hours) at 05/03/17 1309 Last data filed at 05/03/17 1115  Gross per 24 hour  Intake            652.5 ml  Output             1750 ml  Net          -1097.5 ml   Filed Weights   05/01/17 0500  Weight: 77.7 kg (171 lb 4.8 oz)    Examination:  General exam:  Adult Female, pale appearing.  No acute distress.  Respiratory system: Bilateral decreased breath sounds at bases with some scattered crackles Cardiovascular system: Rate controlled, normal S1/S2. No murmurs, rubs, gallops or clicks.   Gastrointestinal system: Normal active bowel sounds, soft, nondistended, nontender. Extremities: No cyanosis, clubbing or edema  Data Reviewed: I have personally reviewed following labs and imaging studies  CBC:  Recent Labs Lab 04/30/17 1459 05/01/17 0552 05/02/17 0459 05/02/17 2355 05/03/17 0422  WBC 12.7* 11.0* 6.1  --  5.8  NEUTROABS 10.1* 8.6*  --   --   --   HGB 7.7* 7.3* 6.6* 8.3* 8.1*  HCT 22.6* 22.0* 20.4* 25.4*  25.3*  MCV 81.9 83.3 82.6  --  84.3  PLT 200 135* 120*  --  122*   Basic Metabolic Panel:  Recent Labs Lab 04/30/17 1459 05/01/17 0552 05/02/17 0459 05/02/17 2355 05/03/17 0422  NA 134* 134* 135 134* 136  K 3.3* 4.0 3.2* 3.4* 3.5  CL 102 103 104 105 106  CO2 22 19* GLUCOSE 93 136* 132* 257* 171*  BUN 26* 26* 27* 21* 18  CREATININE 1.43* 1.45* 1.61* 1.46* 1.49*  CALCIUM 8.6* 8.5* 8.3* 8.4* 8.5*   GFR: Estimated Creatinine Clearance: 32.7 mL/min (A) (by C-G formula based on SCr of 1.49 mg/dL (H)). Liver Function Tests:  Recent Labs Lab 04/30/17 1459 04/30/17 1957 05/01/17 1610 05/02/17 0459 05/03/17 0422  AST 90*  --  107* 160* 146*  ALT 40  --  50 94* 103*  ALKPHOS 72  --  75 66 67  BILITOT 3.9* 3.8* 3.2* 1.9* 1.9*  PROT 6.4*  --  6.2* 5.7* 5.9*  ALBUMIN 2.6*  --  2.5* 2.3* 2.4*   No results for input(s): LIPASE, AMYLASE in the last 168 hours. No results for input(s): AMMONIA in the last 168 hours. Coagulation Profile:  Recent Labs Lab 05/02/17 0459 05/03/17 0422  INR 1.21 1.21   Cardiac Enzymes: No results for input(s): CKTOTAL, CKMB, CKMBINDEX, TROPONINI in the last 168 hours. BNP (last 3 results) No results for input(s): PROBNP in the last 8760 hours. HbA1C: No results for input(s): HGBA1C in the last 72 hours. CBG:  Recent Labs Lab 05/02/17 1313 05/02/17 1718 05/02/17 2242 05/03/17 0741 05/03/17 1220  GLUCAP 138* 192* 261* 136* 152*   Lipid Profile: No results for input(s): CHOL, HDL, LDLCALC, TRIG, CHOLHDL, LDLDIRECT in the last 72 hours. Thyroid Function Tests: No results for input(s): TSH, T4TOTAL, FREET4, T3FREE, THYROIDAB in the last 72 hours. Anemia Panel:  Recent Labs  04/30/17 1957 04/30/17 2001  VITAMINB12 1,521*  --   FOLATE  --  23.7  FERRITIN 2,091*  --   TIBC 256  --   IRON 55  --   RETICCTPCT 5.5*  --    Urine analysis:    Component Value Date/Time   COLORURINE AMBER (A) 04/30/2017 1459   APPEARANCEUR  HAZY (A) 04/30/2017 1459   LABSPEC 1.011 04/30/2017 1459   PHURINE 5.0 04/30/2017 1459   GLUCOSEU NEGATIVE 04/30/2017 1459   HGBUR LARGE (A) 04/30/2017 1459   BILIRUBINUR NEGATIVE 04/30/2017 1459   KETONESUR NEGATIVE 04/30/2017 1459   PROTEINUR 100 (A) 04/30/2017 1459   NITRITE NEGATIVE 04/30/2017 1459   LEUKOCYTESUR NEGATIVE 04/30/2017 1459   Sepsis Labs: (procalcitonin:4,lacticidven:4)  ) Recent Results (from the past 240 hour(s))  Urine culture     Status: None   Collection Time: 04/30/17  3:04 PM  Result Value Ref Range Status   Specimen Description URINE, CATHETERIZED  Final   Special Requests NONE  Final   Culture NO GROWTH  Final   Report Status 05/01/2017 FINAL  Final  Blood culture (routine x 2)     Status: None (Preliminary result)   Collection Time: 04/30/17  3:25 PM  Result Value Ref Range Status   Specimen Description BLOOD LEFT HAND  Final   Special Requests   Final    BOTTLES DRAWN AEROBIC AND ANAEROBIC Blood Culture adequate volume   Culture NO GROWTH 2 DAYS  Final   Report Status PENDING  Incomplete  Blood culture (routine x 2)     Status: None (Preliminary result)   Collection Time: 04/30/17  4:21 PM  Result Value Ref Range Status   Specimen Description BLOOD RIGHT HAND  Final   Special Requests   Final    BOTTLES DRAWN AEROBIC AND ANAEROBIC Blood Culture adequate volume   Culture NO GROWTH 2 DAYS  Final   Report Status PENDING  Incomplete      Radiology Studies: US Renal  Result Date: 05/02/2017 CLINICAL DATA:  Acute renal injury EXAM: RENAL / URINARY TRACT ULTRASOUND COMPLETE COMPARISON:  CT 04/30/2017 FINDINGS: Right Kidney: Length: 9 cm.  Echogenicity within normal limits. Left Kidney: Length: 10.6 cm. Echogenicity within normal limits. A 3 x 3 x 4 mm nonobstructing upper pole renal calculus is noted. No mass or hydronephrosis visualized. No mass or hydronephrosis visualized.  Bladder: Appears normal for degree of bladder distention.  IMPRESSION: 1. No obstructive uropathy. Maintained cortical-medullary distinction within both kidneys. No findings to explain the patient's acute renal injury. 2. 3 x 3 x 4 mm nonobstructing left upper pole renal calculus is identified sonographically. Electronically Signed   By: Tollie Eth M.D.   On: 05/02/2017 23:18     Scheduled Meds: . aspirin  325 mg Oral Daily  . atorvastatin  80 mg Oral q1800  . carvedilol  6.25 mg Oral BID WC  . cholecalciferol  2,000 Units Oral Daily  . darifenacin  15 mg Oral Daily  . ferrous sulfate  325 mg Oral BID WC  . insulin aspart  0-15 Units Subcutaneous TID WC  . insulin aspart  0-5 Units Subcutaneous QHS  . insulin aspart  2 Units Subcutaneous TID WC  . insulin detemir  25 Units Subcutaneous QHS  . potassium chloride SA  20 mEq Oral BID  . sodium chloride flush  3 mL Intravenous Q12H   Continuous Infusions: . sodium chloride Stopped (04/30/17 1813)  . sodium chloride    . piperacillin-tazobactam (ZOSYN)  IV Stopped (05/03/17 0754)     LOS: 3 days    Time spent: 30 min    Glade Lloyd, MD [facility] Pager [phone]  If 7PM-7AM, please contact night-coverage [email or web address] Password [email or web address] 05/03/2017, 1:09 PM

## 2017-05-03 NOTE — Progress Notes (Signed)
CT surgery  Patient is improved after transfusion Agree with assessment by Dr. Renae Fickle that the patient had a delayed transfusion reaction with hemolysis. I reviewed her transthoracic echocardiogram performed yesterday which shows normal LV function, no pericardial effusion or valvular disease which could be related to hemolysis. Her surgical incisions are healing well She remains in sinus rhythm Once her hemolytic anemia has been treated adequately she should probably return to a skilled nursing facility [ which was in place prior to her readmission] before returning home.

## 2017-05-03 NOTE — Progress Notes (Signed)
Nursing Note: The patient has asked again that [facility] be her first option for Rehab.  The patient and family are aware that the patient has lost her bed.  Her second option would be inpatient rehab at [facility].  The patient and family are requesting that SW provide ratings of other facilities if these two options are not available.

## 2017-05-04 DIAGNOSIS — R5381 Other malaise: Secondary | ICD-10-CM

## 2017-05-04 DIAGNOSIS — R6510 Systemic inflammatory response syndrome (SIRS) of non-infectious origin without acute organ dysfunction: Secondary | ICD-10-CM

## 2017-05-04 LAB — COMPREHENSIVE METABOLIC PANEL
ALBUMIN: 2.5 g/dL — AB (ref 3.5–5.0)
ALT: 82 U/L — ABNORMAL HIGH (ref 14–54)
ANION GAP: 7 (ref 5–15)
AST: 81 U/L — ABNORMAL HIGH (ref 15–41)
Alkaline Phosphatase: 66 U/L (ref 38–126)
BILIRUBIN TOTAL: 1.2 mg/dL (ref 0.3–1.2)
BUN: 11 mg/dL (ref 6–20)
CO2: 21 mmol/L — ABNORMAL LOW (ref 22–32)
Calcium: 8.5 mg/dL — ABNORMAL LOW (ref 8.9–10.3)
Chloride: 108 mmol/L (ref 101–111)
Creatinine, Ser: 1.31 mg/dL — ABNORMAL HIGH (ref 0.44–1.00)
GFR calc Af Amer: 46 mL/min — ABNORMAL LOW (ref >60)
GFR calc non Af Amer: 40 mL/min — ABNORMAL LOW (ref >60)
GLUCOSE: 164 mg/dL — AB (ref 65–99)
POTASSIUM: 3.9 mmol/L (ref 3.5–5.1)
Sodium: 136 mmol/L (ref 135–145)
TOTAL PROTEIN: 6.1 g/dL — AB (ref 6.5–8.1)

## 2017-05-04 LAB — GLUCOSE, CAPILLARY
GLUCOSE-CAPILLARY: 123 mg/dL — AB (ref 65–99)
Glucose-Capillary: 149 mg/dL — ABNORMAL HIGH (ref 65–99)
Glucose-Capillary: 262 mg/dL — ABNORMAL HIGH (ref 65–99)
Glucose-Capillary: 234 mg/dL — ABNORMAL HIGH (ref 65–99)

## 2017-05-04 LAB — BPAM RBC
BLOOD PRODUCT EXPIRATION DATE: 201810092359
Blood Product Expiration Date: 201810152359
ISSUE DATE / TIME: 201809211215
ISSUE DATE / TIME: 201809251726
UNIT TYPE AND RH: 5100
Unit Type and Rh: 5100

## 2017-05-04 LAB — TYPE AND SCREEN
ABO/RH(D): O POS
ANTIBODY SCREEN: POSITIVE
DAT, IgG: NEGATIVE
DONOR AG TYPE: NEGATIVE
Donor AG Type: NEGATIVE
Unit division: 0
Unit division: 0

## 2017-05-04 LAB — MAGNESIUM: Magnesium: 1.8 mg/dL (ref 1.7–2.4)

## 2017-05-04 LAB — CBC
HEMATOCRIT: 26.1 % — AB (ref 36.0–46.0)
HEMOGLOBIN: 8.4 g/dL — AB (ref 12.0–15.0)
MCH: 27.2 pg (ref 26.0–34.0)
MCHC: 32.2 g/dL (ref 30.0–36.0)
MCV: 84.5 fL (ref 78.0–100.0)
Platelets: 120 10*3/uL — ABNORMAL LOW (ref 150–400)
RBC: 3.09 MIL/uL — ABNORMAL LOW (ref 3.87–5.11)
RDW: 16.8 % — ABNORMAL HIGH (ref 11.5–15.5)
WBC: 7 10*3/uL (ref 4.0–10.5)

## 2017-05-04 LAB — PROCALCITONIN

## 2017-05-04 LAB — LACTATE DEHYDROGENASE: LDH: 473 U/L — AB (ref 98–192)

## 2017-05-04 LAB — PROTIME-INR
INR: 1.2
Prothrombin Time: 15.1 seconds (ref 11.4–15.2)

## 2017-05-04 MED ORDER — FERROUS SULFATE 325 (65 FE) MG PO TABS: 325.0000 mg | ORAL_TABLET | Freq: Two times a day (BID) | ORAL | 0 refills | Status: DC

## 2017-05-04 MED ORDER — FUROSEMIDE 40 MG PO TABS: 20.0000 mg | ORAL_TABLET | Freq: Every day | ORAL | 0 refills | Status: DC

## 2017-05-04 MED ORDER — ACETAMINOPHEN 325 MG PO TABS: 650.0000 mg | ORAL_TABLET | Freq: Four times a day (QID) | ORAL | 0 refills | Status: DC | PRN

## 2017-05-04 MED ORDER — SENNOSIDES-DOCUSATE SODIUM 8.6-50 MG PO TABS: 1.0000 | ORAL_TABLET | Freq: Every evening | ORAL | 0 refills | Status: DC | PRN

## 2017-05-04 MED ORDER — ZOLPIDEM TARTRATE 5 MG PO TABS
5.0000 mg | ORAL_TABLET | Freq: Once | ORAL | Status: DC
  Filled 2017-05-04: qty 1

## 2017-05-04 MED ORDER — BISACODYL 5 MG PO TBEC: 5.0000 mg | DELAYED_RELEASE_TABLET | Freq: Every day | ORAL | 0 refills | Status: DC | PRN

## 2017-05-04 NOTE — Discharge Summary (Addendum)
Physician Discharge Summary  Andrea [name] ZOX:[id number] DOB: [birth year] DOA: 04/30/2017  PCP: Laurena Slimmer, MD  Admit date: 04/30/2017 Discharge date: 05/04/2017  Admitted From: Home Disposition:  SNF vs CIR  Recommendations for Outpatient Follow-up:  1. Follow up with provider at SNF or CIR at earliest convenience with repeat CBC/BMP 2. Follow up with Cardiothoracic surgery as scheduled  Home Health: No   Equipment/Devices: None  Discharge Condition: Stable  CODE STATUS: Full  Diet recommendation: Heart Healthy / Carb Modified   Brief/Interim Summary: 71 y.o.femalewith medical history significant for insulin-dependent diabetes mellitus, hypertension, chronic diastolic CHF, and coronary artery disease status post CABG on 04/18/2017, presented from her SNF for evaluation of fevers with generalized weakness and lethargy. Patient was discharged from the hospital to SNF on 04/28/2017 and reports that she was starting to havefevers at that time. She was found to be anemic with elevated bilirubin. It was thought that she was having fever secondary to delayed hemolytic reaction. She was transfused packed red cells. Cardiaothoracic surgery is following the patient. Cultures have been negative. She is afebrile. She will be discharged to SNF or CIR once bed is available.  Addendum: Patient was not able to be discharged on 05/04/2017 due to bed availability issues. Patient will be discharged to SNF or CIR once bed is available.   Discharge Diagnoses:  Principal Problem:   Symptomatic anemia Active Problems:   Hypertension   Diabetes mellitus, type II, insulin dependent (HCC)   CAD (coronary artery disease)   SIRS (systemic inflammatory response syndrome) (HCC)   Hyperbilirubinemia   AKI (acute kidney injury) (HCC)   Chronic diastolic CHF (congestive heart failure) (HCC)   History of CVA in adulthood   Fever in adult   Hypokalemia   Jaundice   Pressure injury of  skin  Symptomatic anemia (fatigue/lethargy) - Probably due to delayed hemolytic transfusion reaction  -  Direct coombs was negative - Status post 2 units packed red cells transfusion since admission - Hemoglobin stable today. Outpatient followup -  Continue oral iron  SIRS - expected low grade fever from surgery or from hemolysis as above. - Cultures negative. -  Initially started on broad spectrum antibiotics. DC antibiotics and monitor. - Lower extremity duplex negative - continue incentive spirometry   2. CAD s/p recent CABG  - No anginal complaints  - Underwent CABG on 04/18/17  - Continue ASA, Lipitor, Coreg  - CT surgery follow-up appreciated. Outpatient follow up  4. Acute kidney injury, may be secondary to diuretics plus anemia or hemolysis.   -Creatinine improving. Resume lasix at 20mg  daily and monitor creatinine  5. Chronic diastolic CHF  -  plan as above. Outpatient followup with Cardiology  6. Hypokalemia  -  Improving. putpatient followup  7. Insulin-dependent DM, well controlled  - A1c was 7.8% earlier this month  - continue levemir. Hold Janumet  Discharge Instructions  Discharge Instructions    Call MD for:  difficulty breathing, headache or visual disturbances    Complete by:  As directed    Call MD for:  extreme fatigue    Complete by:  As directed    Call MD for:  hives    Complete by:  As directed    Call MD for:  persistant dizziness or light-headedness    Complete by:  As directed    Call MD for:  persistant nausea and vomiting    Complete by:  As directed    Call MD for:  redness, tenderness,  or signs of infection (pain, swelling, redness, odor or green/yellow discharge around incision site)    Complete by:  As directed    Call MD for:  severe uncontrolled pain    Complete by:  As directed    Call MD for:  temperature >100.4    Complete by:  As directed    Diet - low sodium heart healthy    Complete by:  As directed    Diet Carb  Modified    Complete by:  As directed    Increase activity slowly    Complete by:  As directed      Allergies as of 05/04/2017   No Known Allergies     Medication List    STOP taking these medications   sitaGLIPtin-metformin 50-1000 MG tablet Commonly known as:  JANUMET     TAKE these medications   acetaminophen 325 MG tablet Commonly known as:  TYLENOL Take 2 tablets (650 mg total) by mouth every 6 (six) hours as needed for mild pain (or Fever >/= 101). What changed:  when to take this  reasons to take this   aspirin 325 MG EC tablet Take 1 tablet (325 mg total) by mouth daily.   atorvastatin 80 MG tablet Commonly known as:  LIPITOR Take 1 tablet (80 mg total) by mouth daily at 6 PM.   bisacodyl 5 MG EC tablet Commonly known as:  DULCOLAX Take 1 tablet (5 mg total) by mouth daily as needed for moderate constipation.   carvedilol 6.25 MG tablet Commonly known as:  COREG Take 1 tablet (6.25 mg total) by mouth 2 (two) times daily with a meal.   ferrous sulfate 325 (65 FE) MG tablet Take 1 tablet (325 mg total) by mouth 2 (two) times daily with a meal.   furosemide 40 MG tablet Commonly known as:  LASIX Take 0.5 tablets (20 mg total) by mouth daily. What changed:  how much to take   insulin aspart 100 UNIT/ML injection Commonly known as:  novoLOG Inject 2-15 Units into the skin 3 (three) times daily before meals.   insulin detemir 100 UNIT/ML injection Commonly known as:  LEVEMIR Inject 30 Units into the skin at bedtime. Sliding Scale   potassium chloride SA 20 MEQ tablet Commonly known as:  K-DUR,KLOR-CON Take 1 tablet (20 mEq total) by mouth daily.   senna-docusate 8.6-50 MG tablet Commonly known as:  Senokot-S Take 1 tablet by mouth at bedtime as needed for mild constipation.   traMADol 50 MG tablet Commonly known as:  ULTRAM Take 2 tablets (100 mg total) by mouth every 6 (six) hours as needed for moderate pain.   VESICARE 10 MG tablet Generic  drug:  solifenacin Take 10 mg by mouth daily.   Vitamin D 2000 units tablet Take 2,000 Units by mouth daily.            Discharge Care Instructions        Start     Ordered   05/04/17 0000  ferrous sulfate 325 (65 FE) MG tablet  2 times daily with meals     05/04/17 1240   05/04/17 0000  bisacodyl (DULCOLAX) 5 MG EC tablet  Daily PRN     05/04/17 1240   05/04/17 0000  senna-docusate (SENOKOT-S) 8.6-50 MG tablet  At bedtime PRN     05/04/17 1240   05/04/17 0000  acetaminophen (TYLENOL) 325 MG tablet  Every 6 hours PRN     05/04/17 1240   05/04/17 0000  furosemide (LASIX) 40 MG tablet  Daily     05/04/17 1240   05/04/17 0000  Increase activity slowly     05/04/17 1240   05/04/17 0000  Diet - low sodium heart healthy     05/04/17 1240   05/04/17 0000  Diet Carb Modified     05/04/17 1240   05/04/17 0000  Call MD for:  temperature >100.4     05/04/17 1240   05/04/17 0000  Call MD for:  persistant nausea and vomiting     05/04/17 1240   05/04/17 0000  Call MD for:  severe uncontrolled pain     05/04/17 1240   05/04/17 0000  Call MD for:  redness, tenderness, or signs of infection (pain, swelling, redness, odor or green/yellow discharge around incision site)     05/04/17 1240   05/04/17 0000  Call MD for:  difficulty breathing, headache or visual disturbances     05/04/17 1240   05/04/17 0000  Call MD for:  hives     05/04/17 1240   05/04/17 0000  Call MD for:  persistant dizziness or light-headedness     05/04/17 1240   05/04/17 0000  Call MD for:  extreme fatigue     05/04/17 1240     Follow-up Information    Kerin Perna, MD Follow up in 1 week(s).   Specialty:  Cardiothoracic Surgery Contact information: [address] [place] [address] [place] [address] [phone]          No Known Allergies  Consultations:  CT surgery   Procedures/Studies: Dg Chest 2 View  Result Date: 04/30/2017 CLINICAL DATA:  Fever. No chest pain or SOB. Recent CABG x 4  - April 18, 2017. EXAM: CHEST  2 VIEW COMPARISON:  04/27/2017 FINDINGS: Lung base opacity present on the prior study has improved. There is mild residual atelectasis at the left lung base. Lungs are otherwise clear. Minimal pleural effusions have decreased from prior exam. No pneumothorax. Changes from the recent CABG surgery are noted. The cardiac silhouette is mildly enlarged. No mediastinal or hilar masses or evidence of adenopathy. Skeletal structures are grossly intact. IMPRESSION: No acute cardiopulmonary disease. Electronically Signed   By: Amie Portland M.D.   On: 04/30/2017 16:04   Dg Chest 2 View  Result Date: 04/27/2017 CLINICAL DATA:  Post CABG with persistent shortness-of-breath. EXAM: CHEST  2 VIEW COMPARISON:  04/24/2017 FINDINGS: Sternotomy wires unchanged. Lungs are somewhat hypoinflated with stable right base opacification likely small amount of pleural fluid and atelectasis. Improving left base opacification likely improving small effusion and atelectasis. Cannot completely exclude infection in the lung bases. Borderline stable cardiomegaly. Remainder of the exam is unchanged. IMPRESSION: Mild degree of hypoinflation with improvement left base opacification and stable mild right base opacification likely small effusions with atelectasis. Cannot completely exclude infection in the lung bases. Electronically Signed   By: Elberta Fortis M.D.   On: 04/27/2017 08:19   Dg Chest 2 View  Result Date: 04/24/2017 CLINICAL DATA:  Recent coronary bypass grafting EXAM: CHEST  2 VIEW COMPARISON:  04/23/2017 FINDINGS: Postsurgical changes are again seen. Cardiac shadow is stable. Bibasilar atelectatic changes and small bilateral pleural effusions are noted. No pneumothorax is seen. No acute bony abnormality is noted. IMPRESSION: Bibasilar atelectatic changes and small bilateral pleural effusions. Electronically Signed   By: Alcide Clever M.D.   On: 04/24/2017 07:33   Dg Chest 2 View  Result Date:  04/16/2017 CLINICAL DATA:  Chest pain beginning  yesterday afternoon while walking, relieved with nitroglycerin. Similar chest pain this morning. History of hypertension, diabetes. EXAM: CHEST  2 VIEW COMPARISON:  Chest radiograph Jan 03, 2012 FINDINGS: Cardiac silhouette is normal in size. Mediastinal silhouette is nonsuspicious. Similarly tortuous aorta seen with chronic hypertension. No pleural effusion or focal consolidation. No pneumothorax. Mild degenerative change of thoracic spine. Surgical clips in the included right abdomen compatible with cholecystectomy. IMPRESSION: Stable examination:  No acute cardiopulmonary process. Electronically Signed   By: Awilda Metro M.D.   On: 04/16/2017 03:36   Ct Abdomen Pelvis W Contrast  Result Date: 04/30/2017 CLINICAL DATA:  Abdominal pain with nausea and vomiting. EXAM: CT ABDOMEN AND PELVIS WITH CONTRAST TECHNIQUE: Multidetector CT imaging of the abdomen and pelvis was performed using the standard protocol following bolus administration of intravenous contrast. CONTRAST:  80mL ISOVUE-300 IOPAMIDOL (ISOVUE-300) INJECTION 61% COMPARISON:  None. FINDINGS: Lower chest: Linear atelectasis over the right middle lobe and lingula. Small bilateral pleural effusions with associated atelectasis. Median sternotomy wires are present. Calcification of the mitral valve annulus. Hepatobiliary: Previous cholecystectomy. Liver and biliary tree are normal. Pancreas: Within normal. Spleen: Within normal. Adrenals/Urinary Tract: Adrenal glands are normal. Kidneys are normal in size with a couple small bilateral nonobstructing stones. There are 2 small 1 cm hypodensities over the left renal cortex too small to characterize but likely cysts. Ureters are normal. Mild bladder distention which is otherwise unremarkable. Stomach/Bowel: Stomach is within normal. Appendix is normal. Colon is unremarkable. Vascular/Lymphatic: Mild calcified plaque of the abdominal aorta and iliac arteries.  No adenopathy. Reproductive: Within normal. Other: No significant free fluid or focal inflammatory change. Left inguinal hernia containing only mesenteric fat. Musculoskeletal: Minimal degenerate change of the spine and hips. IMPRESSION: No acute findings in the abdomen/pelvis. Minimal bilateral nephrolithiasis.  No obstructing stones. Two 1 cm left renal cortical hypodensities too small to characterize, but likely cysts. Small bilateral pleural effusions with bibasilar atelectasis. Aortic Atherosclerosis (ICD10-I70.0). Electronically Signed   By: Elberta Fortis M.D.   On: 04/30/2017 18:18   US Renal  Result Date: 05/02/2017 CLINICAL DATA:  Acute renal injury EXAM: RENAL / URINARY TRACT ULTRASOUND COMPLETE COMPARISON:  CT 04/30/2017 FINDINGS: Right Kidney: Length: 9 cm.  Echogenicity within normal limits. Left Kidney: Length: 10.6 cm. Echogenicity within normal limits. A 3 x 3 x 4 mm nonobstructing upper pole renal calculus is noted. No mass or hydronephrosis visualized. No mass or hydronephrosis visualized. Bladder: Appears normal for degree of bladder distention. IMPRESSION: 1. No obstructive uropathy. Maintained cortical-medullary distinction within both kidneys. No findings to explain the patient's acute renal injury. 2. 3 x 3 x 4 mm nonobstructing left upper pole renal calculus is identified sonographically. Electronically Signed   By: Tollie Eth M.D.   On: 05/02/2017 23:18   Dg Chest Port 1 View  Result Date: 04/23/2017 CLINICAL DATA:  CABG. EXAM: PORTABLE CHEST 1 VIEW COMPARISON:  04/22/2017 FINDINGS: Median sternotomy for CABG. Numerous leads and wires project over the chest. Midline trachea. Normal heart size for level of inspiration. No pneumothorax. No definite pleural fluid. Mild interstitial edema, accentuated by low lung volumes. Slightly improved aeration today with decreased bibasilar atelectasis. IMPRESSION: Slight improved aeration, with decreased interstitial edema and bibasilar  atelectasis. Electronically Signed   By: Jeronimo Greaves M.D.   On: 04/23/2017 08:54   Dg Chest Port 1 View  Result Date: 04/22/2017 CLINICAL DATA:  Followup CABG EXAM: PORTABLE CHEST 1 VIEW COMPARISON:  04/21/2017 FINDINGS: Previous median sternotomy and CABG. Right internal  jugular venous access sheath remains in place. Slight worsening of bilateral lower lobe atelectasis compared yesterday. Upper lungs remain clear. IMPRESSION: Slight worsening of lower lobe atelectasis on both sides. Electronically Signed   By: Paulina Fusi M.D.   On: 04/22/2017 07:50   Dg Chest Port 1 View  Result Date: 04/21/2017 CLINICAL DATA:  CABG.  Shortness of breath EXAM: PORTABLE CHEST 1 VIEW COMPARISON:  Yesterday FINDINGS: A Swan-Ganz catheter has been removed. Thoracic drains have also been removed. Low volume chest with atelectasis, mildly increased at the left base. No visible pneumothorax. CABG changes. Stable cardiopericardial enlargement. IMPRESSION: 1. No evidence of pneumothorax after thoracic drain removal. 2. Low volume chest with atelectasis, mildly increased on the left compared yesterday. Electronically Signed   By: Marnee Spring M.D.   On: 04/21/2017 07:41   Dg Chest Port 1 View  Result Date: 04/20/2017 CLINICAL DATA:  CABG.  Shortness of breath. EXAM: PORTABLE CHEST 1 VIEW COMPARISON:  04/19/2017. FINDINGS: Interim extubation . Left chest tube Swan-Ganz catheter, mediastinal drainage catheter in stable position. Prior CABG. Cardiomegaly with mild pulmonary venous congestion and interstitial prominence. Low lung volumes with basilar atelectasis. No pleural effusion or pneumothorax. IMPRESSION: 1. Interim extubation. Remaining lines and tubes including left chest tube in stable position. No pneumothorax. 2. Prior CABG. Mild pulmonary venous congestion and interstitial prominence. A mild component of CHF cannot be excluded . 3. Bibasilar atelectasis. Electronically Signed   By: Maisie Fus  Register   On: 04/20/2017  07:39   Dg Chest Port 1 View  Result Date: 04/19/2017 CLINICAL DATA:  Chest tube placement. EXAM: PORTABLE CHEST 1 VIEW COMPARISON:  Radiograph of April 18, 2017. FINDINGS: Stable cardiomediastinal silhouette. Status post coronary artery bypass graft. Endotracheal and nasogastric tubes are unchanged in position. Right internal jugular Swan-Ganz catheter is noted with distal tip in expected position of main pulmonary artery. Left-sided chest tube is noted without pneumothorax. Mild bibasilar subsegmental atelectasis is noted. No significant pleural effusion is noted. Bony thorax is unremarkable. IMPRESSION: Stable support apparatus. Left-sided chest tube is noted without pneumothorax. Mild bibasilar subsegmental atelectasis. Electronically Signed   By: Lupita Raider, M.D.   On: 04/19/2017 07:42   Dg Chest Portable 1 View  Result Date: 04/18/2017 CLINICAL DATA:  Pneumothorax post coronary bypass graft. EXAM: PORTABLE CHEST 1 VIEW COMPARISON:  04/16/2017 preoperative exam. FINDINGS: Portable AP supine view provided. Endotracheal tube tip is 4.5 cm above the carina in satisfactory position. Swan-Ganz catheter from right IJ approach is noted with tip in the expected location of right main pulmonary artery. Left-sided chest tube tip projects up to the posterior left fourth rib. No appreciable pneumothorax or pleural effusion. Lung volumes are slightly low with crowding of interstitial lung markings. An additional catheter is seen project over the superior mediastinum at the T4 vertebral body level. No acute osseous abnormality. IMPRESSION: Low lung volumes with crowding of interstitial lung markings. Support lines and tubes as above. No appreciable pneumothorax or pleural effusion. Electronically Signed   By: Tollie Eth M.D.   On: 04/18/2017 15:27   US Abdomen Limited Ruq  Result Date: 04/30/2017 CLINICAL DATA:  Jaundice, SIRS, hyperbilirubinemia. EXAM: ULTRASOUND ABDOMEN LIMITED RIGHT UPPER QUADRANT  COMPARISON:  CT earlier today. FINDINGS: Gallbladder: Previous cholecystectomy. Common bile duct: Diameter: 7.4 mm. Liver: No focal lesion identified. Within normal limits in parenchymal echogenicity. Portal vein is patent on color Doppler imaging with normal direction of blood flow towards the liver. Small right pleural effusion. IMPRESSION: No acute hepatobiliary  disease. Small right pleural effusion. Electronically Signed   By: Elberta Fortis M.D.   On: 04/30/2017 21:21    Duplex lower extremity on 04/30/2017 - No evidence of deep vein or superficial thrombosis involving the right lower extremity and left lower extremity. - Venous pulsatility is present throughout the limb and may be consistent with CHF or other cardiac etiologies expanding the venous compartments.  Echo on 05/02/2017 - Left ventricle: The cavity size was normal. Wall thickness was increased in a pattern of mild LVH. Systolic function was normal. The estimated ejection fraction was in the range of 60% to 65%. There is hypokinesis of the basalinferolateral myocardium. Doppler parameters are consistent with abnormal left ventricular relaxation (grade 1 diastolic dysfunction). Doppler parameters are consistent with high ventricular filling pressure. - Mitral valve: Calcified annulus. There was mild regurgitation.  Impressions:  - Hypokinesis of the basal inferolateral wall with overall normal LV systolic function; mild diastolic dysfunction; mild LVH; mild MR and TR.  Subjective: Patient seen and examined at bedside. She feels much better. No overnight fever, nausea or vomiting or chest pain  Discharge Exam: Vitals:   05/03/17 2040 05/04/17 0500  BP: 128/64 134/66  Pulse: 71 64  Resp: 16 16  Temp: 98.9 F (37.2 C) 98.9 F (37.2 C)  SpO2: 100% 100%   Vitals:   05/03/17 1444 05/03/17 2040 05/04/17 0500 05/04/17 0700  BP: (!) 132/57 128/64 134/66   Pulse: 67 71 64   Resp:  16 16    Temp: 98.1 F (36.7 C) 98.9 F (37.2 C) 98.9 F (37.2 C)   TempSrc: Oral Oral Oral   SpO2: 97% 100% 100%   Weight:    73.5 kg (162 lb 0.6 oz)  Height:    5\' 1"  (1.549 m)    General: Pt is alert, awake, not in acute distress Cardiovascular: rate controlled, S1/S2 + Respiratory: Bilateral decreased breath sounds at bases Abdominal: Soft, NT, ND, bowel sounds + Extremities: 1+ edema, no cyanosis    The results of significant diagnostics from this hospitalization (including imaging, microbiology, ancillary and laboratory) are listed below for reference.     Microbiology: Recent Results (from the past 240 hour(s))  Urine culture     Status: None   Collection Time: 04/30/17  3:04 PM  Result Value Ref Range Status   Specimen Description URINE, CATHETERIZED  Final   Special Requests NONE  Final   Culture NO GROWTH  Final   Report Status 05/01/2017 FINAL  Final  Blood culture (routine x 2)     Status: None (Preliminary result)   Collection Time: 04/30/17  3:25 PM  Result Value Ref Range Status   Specimen Description BLOOD LEFT HAND  Final   Special Requests   Final    BOTTLES DRAWN AEROBIC AND ANAEROBIC Blood Culture adequate volume   Culture NO GROWTH 3 DAYS  Final   Report Status PENDING  Incomplete  Blood culture (routine x 2)     Status: None (Preliminary result)   Collection Time: 04/30/17  4:21 PM  Result Value Ref Range Status   Specimen Description BLOOD RIGHT HAND  Final   Special Requests   Final    BOTTLES DRAWN AEROBIC AND ANAEROBIC Blood Culture adequate volume   Culture NO GROWTH 3 DAYS  Final   Report Status PENDING  Incomplete     Labs: BNP (last 3 results) No results for input(s): BNP in the last 8760 hours. Basic Metabolic Panel:  Recent Labs Lab 05/01/17 [phone]  05/02/17 0459 05/02/17 2355 05/03/17 0422 05/04/17 0528  NA 134* 135 134* 136 136  K 4.0 3.2* 3.4* 3.5 3.9  CL 103 104 105 106 108  CO2 19* 21*  GLUCOSE 136* 132* 257* 171*  164*  BUN 26* 27* 21* 18 11  CREATININE 1.45* 1.61* 1.46* 1.49* 1.31*  CALCIUM 8.5* 8.3* 8.4* 8.5* 8.5*  MG  --   --   --   --  1.8   Liver Function Tests:  Recent Labs Lab 04/30/17 1459 04/30/17 1957 05/01/17 0552 05/02/17 0459 05/03/17 0422 05/04/17 0528  AST 90*  --  107* 160* 146* 81*  ALT 40  --  50 94* 103* 82*  ALKPHOS 72  --  75 66 67 66  BILITOT 3.9* 3.8* 3.2* 1.9* 1.9* 1.2  PROT 6.4*  --  6.2* 5.7* 5.9* 6.1*  ALBUMIN 2.6*  --  2.5* 2.3* 2.4* 2.5*   No results for input(s): LIPASE, AMYLASE in the last 168 hours. No results for input(s): AMMONIA in the last 168 hours. CBC:  Recent Labs Lab 04/30/17 1459 05/01/17 0552 05/02/17 0459 05/02/17 2355 05/03/17 0422 05/04/17 0528  WBC 12.7* 11.0* 6.1  --  5.8 7.0  NEUTROABS 10.1* 8.6*  --   --   --   --   HGB 7.7* 7.3* 6.6* 8.3* 8.1* 8.4*  HCT 22.6* 22.0* 20.4* 25.4* 25.3* 26.1*  MCV 81.9 83.3 82.6  --  84.3 84.5  PLT 200 135* 120*  --  122* 120*   Cardiac Enzymes: No results for input(s): CKTOTAL, CKMB, CKMBINDEX, TROPONINI in the last 168 hours. BNP: Invalid input(s): POCBNP CBG:  Recent Labs Lab 05/03/17 1220 05/03/17 1704 05/03/17 2043 05/04/17 0815 05/04/17 1235  GLUCAP 152* 225* 259* 123* 149*   D-Dimer No results for input(s): DDIMER in the last 72 hours. Hgb A1c No results for input(s): HGBA1C in the last 72 hours. Lipid Profile No results for input(s): CHOL, HDL, LDLCALC, TRIG, CHOLHDL, LDLDIRECT in the last 72 hours. Thyroid function studies No results for input(s): TSH, T4TOTAL, T3FREE, THYROIDAB in the last 72 hours.  Invalid input(s): FREET3 Anemia work up No results for input(s): VITAMINB12, FOLATE, FERRITIN, TIBC, IRON, RETICCTPCT in the last 72 hours. Urinalysis    Component Value Date/Time   COLORURINE AMBER (A) 04/30/2017 1459   APPEARANCEUR HAZY (A) 04/30/2017 1459   LABSPEC 1.011 04/30/2017 1459   PHURINE 5.0 04/30/2017 1459   GLUCOSEU NEGATIVE 04/30/2017 1459   HGBUR  LARGE (A) 04/30/2017 1459   BILIRUBINUR NEGATIVE 04/30/2017 1459   KETONESUR NEGATIVE 04/30/2017 1459   PROTEINUR 100 (A) 04/30/2017 1459   NITRITE NEGATIVE 04/30/2017 1459   LEUKOCYTESUR NEGATIVE 04/30/2017 1459   Sepsis Labs Invalid input(s): PROCALCITONIN,  WBC,  LACTICIDVEN Microbiology Recent Results (from the past 240 hour(s))  Urine culture     Status: None   Collection Time: 04/30/17  3:04 PM  Result Value Ref Range Status   Specimen Description URINE, CATHETERIZED  Final   Special Requests NONE  Final   Culture NO GROWTH  Final   Report Status 05/01/2017 FINAL  Final  Blood culture (routine x 2)     Status: None (Preliminary result)   Collection Time: 04/30/17  3:25 PM  Result Value Ref Range Status   Specimen Description BLOOD LEFT HAND  Final   Special Requests   Final    BOTTLES DRAWN AEROBIC AND ANAEROBIC Blood Culture adequate volume   Culture NO GROWTH 3 DAYS  Final  Report Status PENDING  Incomplete  Blood culture (routine x 2)     Status: None (Preliminary result)   Collection Time: 04/30/17  4:21 PM  Result Value Ref Range Status   Specimen Description BLOOD RIGHT HAND  Final   Special Requests   Final    BOTTLES DRAWN AEROBIC AND ANAEROBIC Blood Culture adequate volume   Culture NO GROWTH 3 DAYS  Final   Report Status PENDING  Incomplete     Time coordinating discharge: 35 minutes  SIGNED:   Glade Lloyd, MD  [facility] 05/04/2017, 12:40 PM Pager: [phone]  If 7PM-7AM, please contact night-coverage [email or web address] Password [email or web address]

## 2017-05-04 NOTE — Progress Notes (Signed)
CSW following for disposition. CSW met with patient and family last night and gave SNF bed offers. Family indicated preference on CIR. CSW consulted with RNCM today who indicated CIR consult pending. CSW reviewed chart, see that CIR will start insurance auth after OT. CSW will follow up for SNF if needed pending CIR auth.  Andrea [name], [place]

## 2017-05-04 NOTE — Progress Notes (Signed)
I met with pt at bedside to begin discussions concerning a possible inpt rehab admission. She now prefers inpt rehab rather than return to [facility] if insurance will approval. I have asked for OT eval so that I can begin insurance authorization with [employer]. [phone]

## 2017-05-04 NOTE — Care Management Important Message (Signed)
Important Message  Patient Details  Name: MANDOLIN FALWELL MRN: [id number] Date of Birth: [birth year]   Medicare Important Message Given:  Yes    Melonee Gerstel 05/04/2017, 2:10 PM

## 2017-05-04 NOTE — Consult Note (Signed)
Physical Medicine and Rehabilitation Consult   Reason for Consult: Debility.  Referring Physician: Dr. Malachi Bonds.    HPI: Andrea [name] is a 71 y.o. female with history of CHF, DMT2, macula degeneration with visual deficits, CVA with mild left sided weakness, CAD with recent NSTEMI s/p CABG 04/18/17 who was discharged to [facility] for rehab.  She was readmitted on 04/30/17 with weakness due to SIRS with symptomatic anemia due to delayed hemolytic reaction from recent blood transfusion. She has improved with short course of IV antibiotics and one unit PRBC.  PT evaluation done yesterdya revealing debility. Family requesting CIR for due to deficits in mobility and endurance.    Review of Systems  Constitutional: Negative for chills and fever.  HENT: Negative for hearing loss.   Eyes: Negative for blurred vision and double vision.       Left visual field deficits.   Respiratory: Negative for cough.   Cardiovascular: Positive for leg swelling. Negative for chest pain and palpitations.  Gastrointestinal: Negative for constipation, heartburn and nausea.  Genitourinary: Negative for dysuria and urgency.  Musculoskeletal: Negative for myalgias.  Skin: Negative for rash.  Neurological: Positive for focal weakness. Negative for dizziness, speech change and headaches.  Psychiatric/Behavioral: The patient is not nervous/anxious and does not have insomnia.       Past Medical History:  Diagnosis Date  . Anemia 04/30/2017  . CHF (congestive heart failure) (HCC)   . Diabetes mellitus   . Hypertension   . Hypokalemia 04/2017  . Macular degeneration disease    loss of central vision on left and decreased peripheral vision.   . Stroke (HCC) 12/2011   no residuals noted    Past Surgical History:  Procedure Laterality Date  . CHOLECYSTECTOMY    . CORONARY ARTERY BYPASS GRAFT N/A 04/18/2017   Procedure: CORONARY ARTERY BYPASS GRAFTING (CABG) times four using left internal mammary artery  and right leg saphenous vein;  Surgeon: Kerin Perna, MD;  Location: [facility] OR;  Service: Open Heart Surgery;  Laterality: N/A;  . LEFT HEART CATH AND CORONARY ANGIOGRAPHY N/A 04/17/2017   Procedure: LEFT HEART CATH AND CORONARY ANGIOGRAPHY;  Surgeon: Marykay Lex, MD;  Location: [facility] INVASIVE CV LAB;  Service: Cardiovascular;  Laterality: N/A;  . ROTATOR CUFF REPAIR     left  . TEE WITHOUT CARDIOVERSION N/A 04/18/2017   Procedure: TRANSESOPHAGEAL ECHOCARDIOGRAM (TEE);  Surgeon: Donata Clay, Theron Arista, MD;  Location: [facility] OR;  Service: Open Heart Surgery;  Laterality: N/A;    Family History  Problem Relation Age of Onset  . Heart attack Father   . Hypertension Father   . Heart attack Brother   . Hypertension Brother   . Hypertension Mother   . Stroke Mother   . Healthy Brother     Social History:  Married. Retired Airline pilot. Live with husband and reports other family members at home. Was independent PTA. reports that she quit smoking about 20 years ago. Her smoking use included Cigarettes. She has a 10.00 pack-year smoking history. She has never used smokeless tobacco. She reports that she does not drink alcohol or use drugs.    Allergies: No Known Allergies    Medications Prior to Admission  Medication Sig Dispense Refill  . acetaminophen (TYLENOL) 325 MG tablet Take 650 mg by mouth every 4 (four) hours.    Marland Kitchen aspirin EC 325 MG EC tablet Take 1 tablet (325 mg total) by mouth daily. 30 tablet 0  . atorvastatin (LIPITOR) 80  MG tablet Take 1 tablet (80 mg total) by mouth daily at 6 PM. 30 tablet 1  . carvedilol (COREG) 6.25 MG tablet Take 1 tablet (6.25 mg total) by mouth 2 (two) times daily with a meal. 60 tablet 1  . Cholecalciferol (VITAMIN D) 2000 UNITS tablet Take 2,000 Units by mouth daily.    . furosemide (LASIX) 40 MG tablet Take 1 tablet (40 mg total) by mouth daily. 7 tablet 0  . insulin aspart (NOVOLOG) 100 UNIT/ML injection Inject 2-15 Units into the skin 3 (three) times daily  before meals.    . insulin detemir (LEVEMIR) 100 UNIT/ML injection Inject 30 Units into the skin at bedtime. Sliding Scale    . potassium chloride (K-DUR,KLOR-CON) 20 MEQ tablet Take 1 tablet (20 mEq total) by mouth daily. 7 tablet 0  . sitaGLIPtan-metformin (JANUMET) 50-1000 MG per tablet Take 1 tablet by mouth 2 (two) times daily with a meal.    . traMADol (ULTRAM) 50 MG tablet Take 2 tablets (100 mg total) by mouth every 6 (six) hours as needed for moderate pain. 30 tablet 1  . VESICARE 10 MG tablet Take 10 mg by mouth daily.  5    Home: Home Living Family/patient expects to be discharged to:: Skilled nursing facility Living Arrangements: Spouse/significant other  Functional History: Prior Function Level of Independence: Needs assistance Gait / Transfers Assistance Needed: Pt reports she was working with PT on walking using RW.  ADL's / Homemaking Assistance Needed: Reports she was able to assist with bathin at facility, however, requred assist with reaching her back.  Functional Status:  Mobility: Bed Mobility General bed mobility comments: Pt sitting on toilet prior to session.  Transfers Overall transfer level: Needs assistance Equipment used: None Transfers: Sit to/from Stand Sit to Stand: Min guard General transfer comment: Min guard for steadying assist. Demonstrated good hand placement on knees to maintain sternal precautions.  Ambulation/Gait Ambulation/Gait assistance: Min guard, Min assist Ambulation Distance (Feet): 50 Feet Assistive device: 1 person hand held assist (IV pole ) Gait Pattern/deviations: Step-through pattern, Decreased step length - left, Decreased dorsiflexion - left General Gait Details: Min guard to occaisional min A for balance during gait. Slow, guarded gait. Required standing rest breaks X3 secondary to fatigue.  Gait velocity: decreased Gait velocity interpretation: Below normal speed for age/gender    ADL:    Cognition: Cognition Overall  Cognitive Status: Within Functional Limits for tasks assessed Orientation Level: Oriented X4 Cognition Arousal/Alertness: Awake/alert Behavior During Therapy: WFL for tasks assessed/performed Overall Cognitive Status: Within Functional Limits for tasks assessed  Blood pressure 134/66, pulse 64, temperature 98.9 F (37.2 C), temperature source Oral, resp. rate 16, weight 77.7 kg (171 lb 4.8 oz), SpO2 100 %. Physical Exam  Nursing note and vitals reviewed. Constitutional: She is oriented to person, place, and time. She appears well-developed and well-nourished. No distress.  HENT:  Head: Normocephalic and atraumatic.  Mouth/Throat: Oropharynx is clear and moist.  Eyes: Pupils are equal, round, and reactive to light. Conjunctivae are normal. Right eye exhibits no discharge. Left eye exhibits no discharge.  Neck: Normal range of motion. Neck supple.  Cardiovascular: Normal rate and regular rhythm.   No murmur heard. Respiratory: Effort normal and breath sounds normal. No stridor.  GI: Soft. Bowel sounds are normal. She exhibits no distension. There is no tenderness.  Musculoskeletal: She exhibits edema.  1+ edema BLE. Incisions RLE healing well.  Neurological: She is alert and oriented to person, place, and time.  Verbose. Speech  clear. Able to follow basic commands without difficulty. Some delays with the left hand and leg. Mild left pronator drift. RUE 4/5. LUE 4-/5. RLE: 3/5 prox to distal. LLE: 2+ to 3/5 prox to distal  Skin: Skin is warm and dry. She is not diaphoretic.  Chest incision, leg incisions intact. Retention sutures still in place  Psychiatric: She has a normal mood and affect. Her behavior is normal. Thought content normal.    Results for orders placed or performed during the hospital encounter of 04/30/17 (from the past 24 hour(s))  Sodium, urine, random     Status: None   Collection Time: 05/03/17 10:39 AM  Result Value Ref Range   Sodium, Ur 30 mmol/L  Creatinine,  urine, random     Status: None   Collection Time: 05/03/17 10:39 AM  Result Value Ref Range   Creatinine, Urine 84.72 mg/dL  Glucose, capillary     Status: Abnormal   Collection Time: 05/03/17 12:20 PM  Result Value Ref Range   Glucose-Capillary 152 (H) 65 - 99 mg/dL  Glucose, capillary     Status: Abnormal   Collection Time: 05/03/17  5:04 PM  Result Value Ref Range   Glucose-Capillary 225 (H) 65 - 99 mg/dL  Glucose, capillary     Status: Abnormal   Collection Time: 05/03/17  8:43 PM  Result Value Ref Range   Glucose-Capillary 259 (H) 65 - 99 mg/dL  Procalcitonin     Status: None   Collection Time: 05/04/17  5:28 AM  Result Value Ref Range   Procalcitonin <0.10 ng/mL  Comprehensive metabolic panel     Status: Abnormal   Collection Time: 05/04/17  5:28 AM  Result Value Ref Range   Sodium 136 135 - 145 mmol/L   Potassium 3.9 3.5 - 5.1 mmol/L   Chloride 108 101 - 111 mmol/L   CO2 21 (L) 22 - 32 mmol/L   Glucose, Bld 164 (H) 65 - 99 mg/dL   BUN 11 6 - 20 mg/dL   Creatinine, Ser 1.61 (H) 0.44 - 1.00 mg/dL   Calcium 8.5 (L) 8.9 - 10.3 mg/dL   Total Protein 6.1 (L) 6.5 - 8.1 g/dL   Albumin 2.5 (L) 3.5 - 5.0 g/dL   AST 81 (H) 15 - 41 U/L   ALT 82 (H) 14 - 54 U/L   Alkaline Phosphatase 66 38 - 126 U/L   Total Bilirubin 1.2 0.3 - 1.2 mg/dL   GFR calc non Af Amer 40 (L) >60 mL/min   GFR calc Af Amer 46 (L) >60 mL/min   Anion gap 7 5 - 15  CBC     Status: Abnormal   Collection Time: 05/04/17  5:28 AM  Result Value Ref Range   WBC 7.0 4.0 - 10.5 K/uL   RBC 3.09 (L) 3.87 - 5.11 MIL/uL   Hemoglobin 8.4 (L) 12.0 - 15.0 g/dL   HCT 09.6 (L) 04.5 - 40.9 %   MCV 84.5 78.0 - 100.0 fL   MCH 27.2 26.0 - 34.0 pg   MCHC 32.2 30.0 - 36.0 g/dL   RDW 81.1 (H) 91.4 - 78.2 %   Platelets 120 (L) 150 - 400 K/uL  Protime-INR     Status: None   Collection Time: 05/04/17  5:28 AM  Result Value Ref Range   Prothrombin Time 15.1 11.4 - 15.2 seconds   INR 1.20   Lactate dehydrogenase     Status:  Abnormal   Collection Time: 05/04/17  5:28 AM  Result Value  Ref Range   LDH 473 (H) 98 - 192 U/L  Magnesium     Status: None   Collection Time: 05/04/17  5:28 AM  Result Value Ref Range   Magnesium 1.8 1.7 - 2.4 mg/dL  Glucose, capillary     Status: Abnormal   Collection Time: 05/04/17  8:15 AM  Result Value Ref Range   Glucose-Capillary 123 (H) 65 - 99 mg/dL   Comment 1 Notify RN    US Renal  Result Date: 05/02/2017 CLINICAL DATA:  Acute renal injury EXAM: RENAL / URINARY TRACT ULTRASOUND COMPLETE COMPARISON:  CT 04/30/2017 FINDINGS: Right Kidney: Length: 9 cm.  Echogenicity within normal limits. Left Kidney: Length: 10.6 cm. Echogenicity within normal limits. A 3 x 3 x 4 mm nonobstructing upper pole renal calculus is noted. No mass or hydronephrosis visualized. No mass or hydronephrosis visualized. Bladder: Appears normal for degree of bladder distention. IMPRESSION: 1. No obstructive uropathy. Maintained cortical-medullary distinction within both kidneys. No findings to explain the patient's acute renal injury. 2. 3 x 3 x 4 mm nonobstructing left upper pole renal calculus is identified sonographically. Electronically Signed   By: Tollie Eth M.D.   On: 05/02/2017 23:18    Assessment/Plan: Diagnosis: debility after recent CABG which was complicated by SIRS once at SNF. Has residual left sided motor symptoms from prior right CVA 1. Does the need for close, 24 hr/day medical supervision in concert with the patient's rehab needs make it unreasonable for this patient to be served in a less intensive setting? Yes 2. Co-Morbidities requiring supervision/potential complications: CAD, wound care, DM, ID mgt, HTN 3. Due to bladder management, bowel management, safety, skin/wound care, disease management, medication administration, pain management and patient education, does the patient require 24 hr/day rehab nursing? Yes 4. Does the patient require coordinated care of a physician, rehab nurse, PT  (1-2 hrs/day, 5 days/week) and OT (1-2 hrs/day, 5 days/week) to address physical and functional deficits in the context of the above medical diagnosis(es)? Yes Addressing deficits in the following areas: balance, endurance, locomotion, strength, transferring, bowel/bladder control, bathing, dressing, feeding, grooming, toileting and psychosocial support 5. Can the patient actively participate in an intensive therapy program of at least 3 hrs of therapy per day at least 5 days per week? Yes 6. The potential for patient to make measurable gains while on inpatient rehab is excellent 7. Anticipated functional outcomes upon discharge from inpatient rehab are modified independent  with PT, modified independent with OT, modified independent with SLP. 8. Estimated rehab length of stay to reach the above functional goals is: 7 days 9. Anticipated D/C setting: Home 10. Anticipated post D/C treatments: HH therapy 11. Overall Rehab/Functional Prognosis: excellent  RECOMMENDATIONS: This patient's condition is appropriate for continued rehabilitative care in the following setting: CIR Patient has agreed to participate in recommended program. Yes Note that insurance prior authorization may be required for reimbursement for recommended care.  Comment: Rehab Admissions Coordinator to follow up.  Thanks,  Ranelle Oyster, MD, Earlie Counts, PA-C 05/04/2017

## 2017-05-05 DIAGNOSIS — D649 Anemia, unspecified: Secondary | ICD-10-CM

## 2017-05-05 LAB — GLUCOSE, CAPILLARY
GLUCOSE-CAPILLARY: 102 mg/dL — AB (ref 65–99)
Glucose-Capillary: 136 mg/dL — ABNORMAL HIGH (ref 65–99)

## 2017-05-05 LAB — CBC
HEMATOCRIT: 26.7 % — AB (ref 36.0–46.0)
Hemoglobin: 8.7 g/dL — ABNORMAL LOW (ref 12.0–15.0)
MCH: 27.6 pg (ref 26.0–34.0)
MCHC: 32.6 g/dL (ref 30.0–36.0)
MCV: 84.8 fL (ref 78.0–100.0)
Platelets: 143 10*3/uL — ABNORMAL LOW (ref 150–400)
RBC: 3.15 MIL/uL — AB (ref 3.87–5.11)
RDW: 17.3 % — ABNORMAL HIGH (ref 11.5–15.5)
WBC: 6 10*3/uL (ref 4.0–10.5)

## 2017-05-05 LAB — COMPREHENSIVE METABOLIC PANEL
ALBUMIN: 2.6 g/dL — AB (ref 3.5–5.0)
ALK PHOS: 70 U/L (ref 38–126)
ALT: 62 U/L — AB (ref 14–54)
ANION GAP: 7 (ref 5–15)
AST: 46 U/L — ABNORMAL HIGH (ref 15–41)
BILIRUBIN TOTAL: 1.2 mg/dL (ref 0.3–1.2)
BUN: 10 mg/dL (ref 6–20)
CO2: 21 mmol/L — AB (ref 22–32)
Calcium: 8.8 mg/dL — ABNORMAL LOW (ref 8.9–10.3)
Chloride: 109 mmol/L (ref 101–111)
Creatinine, Ser: 1.22 mg/dL — ABNORMAL HIGH (ref 0.44–1.00)
GFR calc Af Amer: 50 mL/min — ABNORMAL LOW (ref >60)
GFR calc non Af Amer: 43 mL/min — ABNORMAL LOW (ref >60)
GLUCOSE: 104 mg/dL — AB (ref 65–99)
Potassium: 3.9 mmol/L (ref 3.5–5.1)
Sodium: 137 mmol/L (ref 135–145)
Total Protein: 5.9 g/dL — ABNORMAL LOW (ref 6.5–8.1)

## 2017-05-05 LAB — TRANSFUSION REACTION
DAT C3: NEGATIVE
POST RXN DAT IGG: NEGATIVE

## 2017-05-05 LAB — PROTIME-INR
INR: 1.18
PROTHROMBIN TIME: 14.9 s (ref 11.4–15.2)

## 2017-05-05 LAB — CULTURE, BLOOD (ROUTINE X 2)
CULTURE: NO GROWTH
Culture: NO GROWTH
SPECIAL REQUESTS: ADEQUATE
Special Requests: ADEQUATE

## 2017-05-05 LAB — LACTATE DEHYDROGENASE: LDH: 407 U/L — AB (ref 98–192)

## 2017-05-05 MED ORDER — FLUCONAZOLE 100 MG PO TABS
150.0000 mg | ORAL_TABLET | Freq: Once | ORAL | Status: AC
  Administered 2017-05-05: 150 mg via ORAL
  Filled 2017-05-05: qty 2

## 2017-05-05 NOTE — Progress Notes (Signed)
CSW and RNCM together met with patient at bedside and had lengthy discussion about patient's choice for discharge. Patient not accepted by CIR as she does not meet requirements. Patient chooses to go home with home health. RNCM to support with discharge. CSW signing off.  Andrea [name], [place]

## 2017-05-05 NOTE — Progress Notes (Signed)
Patient ID: Andrea [name], female   DOB: June 21, 1946, 71 y.o.   MRN: [id number] Addendum: Patient was not able to be discharged on 05/04/2017 due to bed availability issues. Patient will be discharged to SNF or CIR once bed is available. Patient was seen and examined at bedside and questions were answered. Please refer to the discharge summary done by me on 05/04/2017 for full details.

## 2017-05-05 NOTE — Progress Notes (Signed)
I met with pt at bedside to discuss with pt that she is doing very well functionally and is no longer in need of intense inpt rehab at this current level that she is mobilizing. She would like to return to [facility] but she is under the understanding that there are no beds available. I did discuss with her that then it would be another SNF or home with [facility]. She has numerous questions about going home but states she would need a bed and someone to cook for her. I discussed that no one would come cook for her covered by insurance. I have updated RN CM to follow up. We will sign off. [phone]

## 2017-05-05 NOTE — Care Management Note (Signed)
Case Management Note  Patient Details  Name: Andrea [name] MRN: [id number] Date of Birth: January 21, 1946  Subjective/Objective:                    Action/Plan:  SW and NCM discussed discharge planning with patient at bedside. Patient has decided to go home with home health and requesting hospital bed.   Confirmed face sheet information.  Offered choice. Patient would like [facility]. Referral for hospital bed and HHRN/PT given to [facility]. Expected Discharge Date:  05/05/17               Expected Discharge Plan:  Home w Home Health Services  In-House Referral:     Discharge planning Services  CM Consult  Post Acute Care Choice:  Home Health, Durable Medical Equipment Choice offered to:  Patient  DME Arranged:  Hospital bed DME Agency:  [facility]  HH Arranged:  RN, PT [facility] Agency:  [facility]  Status of Service:  Completed, signed off  If discussed at Long Length of Stay Meetings, dates discussed:    Additional Comments:  Kingsley Plan, RN 05/05/2017, 12:33 PM

## 2017-05-05 NOTE — Care Management (Signed)
    Durable Medical Equipment        Start     Ordered   05/05/17 1100  For home use only DME Hospital bed  Once    Question Answer Comment  Patient has (list medical condition): CORONARY ARTERY BYPASS GRAFTING (CABG) times four using left internal mammary artery and right leg saphenous vein (N/A)   The above medical condition requires: Patient requires the ability to reposition frequently   Head must be elevated greater than: 45 degrees   Bed type Semi-electric      05/05/17 1100

## 2017-05-05 NOTE — Progress Notes (Signed)
CARDIOLOGY  Still functionally under performing.  Would be a good CIR candidate. Very motivated to get home.

## 2017-05-05 NOTE — Evaluation (Signed)
Occupational Therapy Evaluation Patient Details Name: Andrea [name] MRN: [id number] DOB: 1945/09/18 Today's Date: 05/05/2017    History of Present Illness Pt is a 71 y/o female admitted from her SNF secondary to fever, AMS, and symptomatic anemia. Imaging negative for DVT and CT of abdomen negative for acute abnormality. Pt recently discharged on 9/21 following CABG. PMH includes HTN, DM, CAD, dCHF, NSTEMI s/p CABG X4, CVA, macular degeneration, and L rotator cuff repair.    Clinical Impression   Pt was independent at her baseline. Presents with L UE residual weakness from prior stroke and inability to complete LB ADL without minimal assistance. Pt is able to stand and ambulate without a device. She requires min assist for bed mobility. Pt has excellent potential to return to modified independence in self care. Educated pt that she will need to rely on assist of her husband for many IADL due to sternal precautions. Pt desires further rehab prior to return home. Will follow acutely.    Follow Up Recommendations  CIR    Equipment Recommendations  3 in 1 bedside commode    Recommendations for Other Services       Precautions / Restrictions Precautions Precautions: Sternal;Fall Precaution Comments: pt able to generalize sternal precautions in ADL and mobility, educated in implementation during IADL Restrictions Weight Bearing Restrictions: No      Mobility Bed Mobility Overal bed mobility: Needs Assistance Bed Mobility: Supine to Sit;Sit to Supine     Supine to sit: Min guard Sit to supine: Min assist   General bed mobility comments: assisted LEs back into bed  Transfers Overall transfer level: Needs assistance Equipment used: None Transfers: Sit to/from Stand Sit to Stand: Supervision         General transfer comment: hands on knees, no reminders    Balance Overall balance assessment: Needs assistance   Sitting balance-Leahy Scale: Good Sitting balance -  Comments: no LOB with donning socks     Standing balance-Leahy Scale: Fair Standing balance comment: ambulating without device                           ADL either performed or assessed with clinical judgement   ADL Overall ADL's : Needs assistance/impaired Eating/Feeding: Independent;Sitting   Grooming: Standing;Supervision/safety   Upper Body Bathing: Minimal assistance;Sitting Upper Body Bathing Details (indicate cue type and reason): assist for back Lower Body Bathing: Minimal assistance;Sit to/from stand Lower Body Bathing Details (indicate cue type and reason): for R foot Upper Body Dressing : Set up;Sitting   Lower Body Dressing: Minimal assistance;Sit to/from stand Lower Body Dressing Details (indicate cue type and reason): can cross L foot over R knee to don sock, needs assist for R sock, pt avoids socks when she can due to difficulty Toilet Transfer: Supervision/safety;Ambulation   Toileting- Clothing Manipulation and Hygiene: Supervision/safety;Sit to/from Nurse, children's Details (indicate cue type and reason): pt reports showering in barrier free shower with NT sitting on BSC Functional mobility during ADLs: Supervision/safety (cues to walk faster)       Vision Baseline Vision/History: Macular Degeneration;Wears glasses (MD in L eye, L eye drifts outward) Wears Glasses: At all times Patient Visual Report: No change from baseline       Perception     Praxis      Pertinent Vitals/Pain Pain Assessment: No/denies pain     Hand Dominance Right   Extremity/Trunk Assessment Upper Extremity Assessment Upper Extremity Assessment: LUE deficits/detail  LUE Deficits / Details: weakness and incoordination from prior CVA and hx of rotator cuff surgery, postures in flexion in sitting, but can use effectively as assist LUE Coordination: decreased gross motor   Lower Extremity Assessment Lower Extremity Assessment: Defer to PT evaluation    Cervical / Trunk Assessment Cervical / Trunk Assessment: Kyphotic   Communication Communication Communication: No difficulties   Cognition Arousal/Alertness: Awake/alert Behavior During Therapy: WFL for tasks assessed/performed Overall Cognitive Status: Within Functional Limits for tasks assessed                                     General Comments       Exercises     Shoulder Instructions      Home Living Family/patient expects to be discharged to:: Private residence Living Arrangements: Spouse/significant other Available Help at Discharge: Family;Available 24 hours/day Type of Home: House Home Access: Stairs to enter [employer] of Steps: 6 Entrance Stairs-Rails: Right Home Layout: One level     Bathroom Shower/Tub: Producer, television/film/video: Standard     Home Equipment: None          Prior Functioning/Environment Level of Independence: Independent (prior to CABG)        Comments: pt drives at baseline        OT Problem List: Cardiopulmonary status limiting activity;Decreased knowledge of use of DME or AE;Decreased activity tolerance      OT Treatment/Interventions: Self-care/ADL training;DME and/or AE instruction;Patient/family education    OT Goals(Current goals can be found in the care plan section) Acute Rehab OT Goals Patient Stated Goal: to go to rehab prior to return home with her husband OT Goal Formulation: With patient Time For Goal Achievement: 05/12/17 Potential to Achieve Goals: Good ADL Goals Pt Will Perform Grooming: Independently;standing Pt Will Perform Lower Body Bathing: with adaptive equipment;sit to/from stand;with modified independence Pt Will Perform Lower Body Dressing: with modified independence;with adaptive equipment;sit to/from stand Pt Will Transfer to Toilet: Independently;ambulating Pt Will Perform Toileting - Clothing Manipulation and hygiene: Independently;sit to/from stand Pt Will  Perform Tub/Shower Transfer: Shower transfer;with supervision;ambulating;3 in 1 Additional ADL Goal #1: Pt will be modified independent in bed mobility. Additional ADL Goal #2: Pt will gather items necessary for ADL around her room independently.  OT Frequency: Min 2X/week   Barriers to D/C:            Co-evaluation              AM-PAC PT "6 Clicks" Daily Activity     Outcome Measure Help from another person eating meals?: None Help from another person taking care of personal grooming?: A Little Help from another person toileting, which includes using toliet, bedpan, or urinal?: A Little Help from another person bathing (including washing, rinsing, drying)?: A Little Help from another person to put on and taking off regular upper body clothing?: A Little Help from another person to put on and taking off regular lower body clothing?: A Little 6 Click Score: 19   End of Session Equipment Utilized During Treatment: Gait belt  Activity Tolerance: Patient tolerated treatment well Patient left: in chair;with call bell/phone within reach  OT Visit Diagnosis: Unsteadiness on feet (R26.81)                Time: 1610-9604 OT Time Calculation (min): 23 min Charges:  OT General Charges $OT Visit: 1 Visit OT Evaluation $OT Eval  Moderate Complexity: 1 Mod OT Treatments $Self Care/Home Management : 8-22 mins G-Codes:    05/23/2017 Martie Round, OTR/L Pager: [phone]  Iran Planas [place] Bailiff 05/23/17, 9:26 AM

## 2017-05-08 ENCOUNTER — Telehealth: Payer: Self-pay | Admitting: Interventional Cardiology

## 2017-05-08 ENCOUNTER — Other Ambulatory Visit: Payer: Self-pay | Admitting: *Deleted

## 2017-05-08 DIAGNOSIS — G8918 Other acute postprocedural pain: Secondary | ICD-10-CM

## 2017-05-08 DIAGNOSIS — R600 Localized edema: Secondary | ICD-10-CM

## 2017-05-08 DIAGNOSIS — K5900 Constipation, unspecified: Secondary | ICD-10-CM

## 2017-05-08 MED ORDER — TRAMADOL HCL 50 MG PO TABS: 50.0000 mg | ORAL_TABLET | Freq: Four times a day (QID) | ORAL | 0 refills | Status: DC | PRN

## 2017-05-08 MED ORDER — TRAMADOL HCL 50 MG PO TABS: 100.0000 mg | ORAL_TABLET | Freq: Four times a day (QID) | ORAL | 0 refills | Status: DC | PRN

## 2017-05-08 MED ORDER — SENNOSIDES-DOCUSATE SODIUM 8.6-50 MG PO TABS: 1.0000 | ORAL_TABLET | Freq: Every evening | ORAL | 0 refills | Status: DC | PRN

## 2017-05-08 MED ORDER — POTASSIUM CHLORIDE CRYS ER 20 MEQ PO TBCR: 20.0000 meq | EXTENDED_RELEASE_TABLET | Freq: Every day | ORAL | 0 refills | Status: DC

## 2017-05-08 NOTE — Telephone Encounter (Signed)
Spoke with Dr. Katrinka Blazing and he said ok for pt to have PT as outlined in original message.  Spoke with [name] with [facility] and made him aware.  [name] verbalized understanding.

## 2017-05-08 NOTE — Telephone Encounter (Signed)
New message   [name] verbalized that the wants verbal order for physical therapy   2x week for 3 weeks and then 1x week for 4 weeks

## 2017-05-12 ENCOUNTER — Ambulatory Visit (INDEPENDENT_AMBULATORY_CARE_PROVIDER_SITE_OTHER): Payer: Medicare Other | Admitting: Interventional Cardiology

## 2017-05-12 ENCOUNTER — Encounter: Payer: Self-pay | Admitting: Interventional Cardiology

## 2017-05-12 VITALS — BP 118/64 | HR 75 | Ht 61.0 in | Wt 157.8 lb

## 2017-05-12 DIAGNOSIS — I10 Essential (primary) hypertension: Secondary | ICD-10-CM | POA: Diagnosis not present

## 2017-05-12 DIAGNOSIS — Z794 Long term (current) use of insulin: Secondary | ICD-10-CM | POA: Diagnosis not present

## 2017-05-12 DIAGNOSIS — I25709 Atherosclerosis of coronary artery bypass graft(s), unspecified, with unspecified angina pectoris: Secondary | ICD-10-CM | POA: Diagnosis not present

## 2017-05-12 DIAGNOSIS — I5032 Chronic diastolic (congestive) heart failure: Secondary | ICD-10-CM

## 2017-05-12 DIAGNOSIS — E78019 Familial hypercholesterolemia, unspecified: Secondary | ICD-10-CM

## 2017-05-12 DIAGNOSIS — E7801 Familial hypercholesterolemia: Secondary | ICD-10-CM

## 2017-05-12 DIAGNOSIS — I6389 Other cerebral infarction: Secondary | ICD-10-CM | POA: Diagnosis not present

## 2017-05-12 DIAGNOSIS — I119 Hypertensive heart disease without heart failure: Secondary | ICD-10-CM | POA: Diagnosis not present

## 2017-05-12 DIAGNOSIS — E119 Type 2 diabetes mellitus without complications: Secondary | ICD-10-CM

## 2017-05-12 NOTE — Patient Instructions (Signed)
Medication Instructions:  Your physician recommends that you continue on your current medications as directed. Please refer to the Current Medication list given to you today.  Labwork: BMET today  Testing/Procedures: None  Follow-Up: Your physician recommends that you schedule a follow-up appointment in: 2-3 months with Dr. Katrinka Blazing.    Any Other Special Instructions Will Be Listed Below (If Applicable).  You have been referred to Cardiac Rehab at [facility] in [facility].     If you need a refill on your cardiac medications before your next appointment, please call your pharmacy.

## 2017-05-12 NOTE — Progress Notes (Signed)
Cardiology Office Note    Date:  05/12/2017   ID:  Andrea [name], DOB 12/17/1945, MRN [id number]  PCP:  Laurena Slimmer, MD  Cardiologist: Lesleigh Noe, MD   Chief Complaint  Patient presents with  . Coronary Artery Disease  . Congestive Heart Failure    History of Present Illness:  Andrea [name] is a 71 y.o. female with recent coronary bypass grafting (LIMA to LAD, SVG to diagonal, SVG to OM) September 2018 after presenting with non-ST elevation myocardial infarction, diabetes mellitus type 2, hypertension, and prior history of CVA. Acute on chronic right cerebral white matter infarct-with advanced small vessel disease. Left facial droop, 2013)  She is improving. She is at home. She is having no angina or prolonged palpitations. She was instructed not to use Janumet because of recent dehydration. She has been taking the medication anyway. Blood sugars have been all less than 200. She denies orthopnea, PND, but does have bilateral mild edema and tenderness/hyperesthesia in the left parasternal area.  Past Medical History:  Diagnosis Date  . Anemia 04/30/2017  . CHF (congestive heart failure) (HCC)   . Diabetes mellitus   . Hypertension   . Hypokalemia 04/2017  . Macular degeneration disease    loss of central vision on left and decreased peripheral vision.   . Stroke (HCC) 12/2011   no residuals noted    Past Surgical History:  Procedure Laterality Date  . CHOLECYSTECTOMY    . CORONARY ARTERY BYPASS GRAFT N/A 04/18/2017   Procedure: CORONARY ARTERY BYPASS GRAFTING (CABG) times four using left internal mammary artery and right leg saphenous vein;  Surgeon: Kerin Perna, MD;  Location: [facility] OR;  Service: Open Heart Surgery;  Laterality: N/A;  . LEFT HEART CATH AND CORONARY ANGIOGRAPHY N/A 04/17/2017   Procedure: LEFT HEART CATH AND CORONARY ANGIOGRAPHY;  Surgeon: Marykay Lex, MD;  Location: [facility] INVASIVE CV LAB;  Service: Cardiovascular;  Laterality: N/A;  .  ROTATOR CUFF REPAIR     left  . TEE WITHOUT CARDIOVERSION N/A 04/18/2017   Procedure: TRANSESOPHAGEAL ECHOCARDIOGRAM (TEE);  Surgeon: Donata Clay, Theron Arista, MD;  Location: [facility] OR;  Service: Open Heart Surgery;  Laterality: N/A;    Current Medications: Outpatient Medications Prior to Visit  Medication Sig Dispense Refill  . acetaminophen (TYLENOL) 325 MG tablet Take 2 tablets (650 mg total) by mouth every 6 (six) hours as needed for mild pain (or Fever >/= 101). 30 tablet 0  . aspirin EC 325 MG EC tablet Take 1 tablet (325 mg total) by mouth daily. 30 tablet 0  . atorvastatin (LIPITOR) 80 MG tablet Take 1 tablet (80 mg total) by mouth daily at 6 PM. 30 tablet 1  . bisacodyl (DULCOLAX) 5 MG EC tablet Take 1 tablet (5 mg total) by mouth daily as needed for moderate constipation. 10 tablet 0  . carvedilol (COREG) 6.25 MG tablet Take 1 tablet (6.25 mg total) by mouth 2 (two) times daily with a meal. 60 tablet 1  . Cholecalciferol (VITAMIN D) 2000 UNITS tablet Take 2,000 Units by mouth daily.    . ferrous sulfate 325 (65 FE) MG tablet Take 1 tablet (325 mg total) by mouth 2 (two) times daily with a meal. 60 tablet 0  . furosemide (LASIX) 40 MG tablet Take 0.5 tablets (20 mg total) by mouth daily. 14 tablet 0  . insulin aspart (NOVOLOG) 100 UNIT/ML injection Inject 2-15 Units into the skin 3 (three) times daily before meals.    Marland Kitchen  insulin detemir (LEVEMIR) 100 UNIT/ML injection Inject 30 Units into the skin at bedtime. Sliding Scale    . potassium chloride SA (K-DUR,KLOR-CON) 20 MEQ tablet Take 1 tablet (20 mEq total) by mouth daily. 7 tablet 0  . senna-docusate (SENOKOT-S) 8.6-50 MG tablet Take 1 tablet by mouth at bedtime as needed for mild constipation. 10 tablet 0  . traMADol (ULTRAM) 50 MG tablet Take 1-2 tablets (50-100 mg total) by mouth every 6 (six) hours as needed. 30 tablet 0  . VESICARE 10 MG tablet Take 10 mg by mouth daily.  5   No facility-administered medications prior to visit.       Allergies:   Patient has no known allergies.   Social History   Social History  . Marital status: Married    Spouse name: N/A  . Number of children: N/A  . Years of education: N/A   Social History Main Topics  . Smoking status: Former Smoker    Packs/day: 0.50    Years: 20.00    Types: Cigarettes    Quit date: 01/02/1997  . Smokeless tobacco: Never Used  . Alcohol use No  . Drug use: No  . Sexual activity: Not Asked   Other Topics Concern  . None   Social History Narrative  . None     Family History:  The patient's family history includes Healthy in her brother; Heart attack in her brother and father; Hypertension in her brother, father, and mother; Stroke in her mother.   ROS:   Please see the history of present illness.    Some difficulty with sleeping but slowly improving. Hyperesthesia left parasternal area  All other systems reviewed and are negative.   PHYSICAL EXAM:   VS:  BP 118/64 (BP Location: Right Arm)   Pulse 75   Ht  (1.549 m)   Wt 157 lb 12.8 oz (71.6 kg)   BMI 29.82 kg/m    GEN: Well nourished, well developed, in no acute distress  HEENT: normal  Neck: no JVD, carotid bruits, or masses Cardiac: RRR; no murmurs, rubs, or gallops,no edema  Respiratory:  clear to auscultation bilaterally, normal work of breathing GI: soft, nontender, nondistended, + BS MS: no deformity or atrophy  Skin: warm and dry, no rash Neuro:  Alert and Oriented x 3, Strength and sensation are intact Psych: euthymic mood, full affect  Wt Readings from Last 3 Encounters:  05/12/17 157 lb 12.8 oz (71.6 kg)  05/04/17 162 lb 0.6 oz (73.5 kg)  04/28/17 162 lb 12.8 oz (73.8 kg)      Studies/Labs Reviewed:   EKG:  EKG  Normal sinus rhythm, left ventricular hypertrophy, left axis deviation.  Recent Labs: 04/17/2017: TSH 2.107 05/04/2017: Magnesium 1.8 05/05/2017: ALT 62; BUN 10; Creatinine, Ser 1.22; Hemoglobin 8.7; Platelets 143; Potassium 3.9; Sodium 137   Lipid  Panel    Component Value Date/Time   CHOL 117 04/17/2017 0014   TRIG 207 (H) 04/17/2017 0014   HDL 35 (L) 04/17/2017 0014   CHOLHDL 3.3 04/17/2017 0014   VLDL 41 (H) 04/17/2017 0014   LDLCALC 41 04/17/2017 0014    Additional studies/ records that were reviewed today include:  No new data   ASSESSMENT:    1. Coronary artery disease involving coronary bypass graft of native heart with angina pectoris (HCC)   2. Cerebrovascular accident (CVA) due to other mechanism   3. Essential hypertension   4. Hypertensive heart disease without heart failure   5. Chronic diastolic  CHF (congestive heart failure) (HCC)   6. Diabetes mellitus, type II, insulin dependent (HCC)   7. Familial hypercholesterolemia      PLAN:  In order of problems listed above:  1. The patient is stable without angina since bypass surgery. 2. Neurological status is dramatically improved. 3. Blood pressure is adequately controlled. Will monitor closely. 4. Not addressed 5. No evidence of significant volume overload. 6. We'll check basic metabolic panel to make sure it is safe to stay on Janumet. She has some renal impairment when discharged from the hospital late last month. 7. LDL target less than 70.  Stable without chest pain, shortness of breath, or other complaints. Has left parasternal soreness related to surgery.  Medication Adjustments/Labs and Tests Ordered: Current medicines are reviewed at length with the patient today.  Concerns regarding medicines are outlined above.  Medication changes, Labs and Tests ordered today are listed in the Patient Instructions below. There are no Patient Instructions on file for this visit.   Signed, Lesleigh Noe, MD  05/12/2017 2:00 PM    [facility] [address] [place], [place], [place]  [address] Phone: [phone]; Fax: [phone]

## 2017-05-13 LAB — BASIC METABOLIC PANEL
BUN / CREAT RATIO: 13 (ref 12–28)
BUN: 17 mg/dL (ref 8–27)
CO2: 22 mmol/L (ref 20–29)
CREATININE: 1.32 mg/dL — AB (ref 0.57–1.00)
Calcium: 9.5 mg/dL (ref 8.7–10.3)
Chloride: 105 mmol/L (ref 96–106)
GFR calc Af Amer: 47 mL/min/{1.73_m2} — ABNORMAL LOW (ref >59)
GFR, EST NON AFRICAN AMERICAN: 41 mL/min/{1.73_m2} — AB (ref >59)
GLUCOSE: 142 mg/dL — AB (ref 65–99)
Potassium: 4.9 mmol/L (ref 3.5–5.2)
SODIUM: 141 mmol/L (ref 134–144)

## 2017-05-16 ENCOUNTER — Other Ambulatory Visit: Payer: Self-pay | Admitting: Cardiothoracic Surgery

## 2017-05-16 DIAGNOSIS — Z951 Presence of aortocoronary bypass graft: Secondary | ICD-10-CM

## 2017-05-17 ENCOUNTER — Encounter: Payer: Self-pay | Admitting: Cardiothoracic Surgery

## 2017-05-17 ENCOUNTER — Ambulatory Visit
Admission: RE | Admit: 2017-05-17 | Discharge: 2017-05-17 | Disposition: A | Discharge status: Home / Self Care | Payer: Medicare Other | Source: Ambulatory Visit | Attending: Surgery | Admitting: Surgery

## 2017-05-17 ENCOUNTER — Ambulatory Visit (INDEPENDENT_AMBULATORY_CARE_PROVIDER_SITE_OTHER): Payer: Self-pay | Admitting: Cardiothoracic Surgery

## 2017-05-17 VITALS — BP 122/70 | HR 80 | Ht 61.0 in | Wt 155.0 lb

## 2017-05-17 DIAGNOSIS — Z951 Presence of aortocoronary bypass graft: Secondary | ICD-10-CM

## 2017-05-17 DIAGNOSIS — D594 Other nonautoimmune hemolytic anemias: Secondary | ICD-10-CM

## 2017-05-17 NOTE — Progress Notes (Signed)
PCP is Laurena Slimmer, MD Referring Provider is Marykay Lex, MD  Chief Complaint  Patient presents with  . Routine Post Op    CABG 04/18/2017    HPI: 71 year old patient returns for follow-up after urgent multivessel CABG for unstable angina and non-STEMI The patient had a long recovery with fluid-renal problems, poor mobility, and anemia. She returned to the ED after discharge with a hemoglobin that dropped from 8.2 ounces 7.2 with shortness of breath and fatigue. She was felt to have a transfusion induced hemolytic anemia from a perioperative transfusion. After receiving a unit of matched blood her hemoglobin stabilized at 8.7. She is currently on oral iron.  She has done much better at home this time. Chest x-ray today is clear She is slowly gaining her strength. Surgical incisions are healing well. She has minimal edema. She has maintained sinus rhythm.  Past Medical History:  Diagnosis Date  . Anemia 04/30/2017  . CHF (congestive heart failure) (HCC)   . Diabetes mellitus   . Hypertension   . Hypokalemia 04/2017  . Macular degeneration disease    loss of central vision on left and decreased peripheral vision.   . Stroke (HCC) 12/2011   no residuals noted    Past Surgical History:  Procedure Laterality Date  . CHOLECYSTECTOMY    . CORONARY ARTERY BYPASS GRAFT N/A 04/18/2017   Procedure: CORONARY ARTERY BYPASS GRAFTING (CABG) times four using left internal mammary artery and right leg saphenous vein;  Surgeon: Kerin Perna, MD;  Location: [facility] OR;  Service: Open Heart Surgery;  Laterality: N/A;  . LEFT HEART CATH AND CORONARY ANGIOGRAPHY N/A 04/17/2017   Procedure: LEFT HEART CATH AND CORONARY ANGIOGRAPHY;  Surgeon: Marykay Lex, MD;  Location: [facility] INVASIVE CV LAB;  Service: Cardiovascular;  Laterality: N/A;  . ROTATOR CUFF REPAIR     left  . TEE WITHOUT CARDIOVERSION N/A 04/18/2017   Procedure: TRANSESOPHAGEAL ECHOCARDIOGRAM (TEE);  Surgeon: Donata Clay, Theron Arista, MD;   Location: [facility] OR;  Service: Open Heart Surgery;  Laterality: N/A;    Family History  Problem Relation Age of Onset  . Heart attack Father   . Hypertension Father   . Heart attack Brother   . Hypertension Brother   . Hypertension Mother   . Stroke Mother   . Healthy Brother     Social History Social History  Substance Use Topics  . Smoking status: Former Smoker    Packs/day: 0.50    Years: 20.00    Types: Cigarettes    Quit date: 01/02/1997  . Smokeless tobacco: Never Used  . Alcohol use No    Current Outpatient Prescriptions  Medication Sig Dispense Refill  . acetaminophen (TYLENOL) 325 MG tablet Take 2 tablets (650 mg total) by mouth every 6 (six) hours as needed for mild pain (or Fever >/= 101). 30 tablet 0  . aspirin EC 325 MG EC tablet Take 1 tablet (325 mg total) by mouth daily. 30 tablet 0  . atorvastatin (LIPITOR) 80 MG tablet Take 1 tablet (80 mg total) by mouth daily at 6 PM. 30 tablet 1  . bisacodyl (DULCOLAX) 5 MG EC tablet Take 1 tablet (5 mg total) by mouth daily as needed for moderate constipation. 10 tablet 0  . carvedilol (COREG) 6.25 MG tablet Take 1 tablet (6.25 mg total) by mouth 2 (two) times daily with a meal. 60 tablet 1  . Cholecalciferol (VITAMIN D) 2000 UNITS tablet Take 2,000 Units by mouth daily.    Marland Kitchen  ferrous sulfate 325 (65 FE) MG tablet Take 1 tablet (325 mg total) by mouth 2 (two) times daily with a meal. 60 tablet 0  . furosemide (LASIX) 40 MG tablet Take 0.5 tablets (20 mg total) by mouth daily. 14 tablet 0  . JANUMET 50-1000 MG tablet Take 1 tablet by mouth 2 (two) times daily.  3  . ONETOUCH VERIO test strip USE TO TEST BLOOD SUGAR 4 TIMES DAILY  12  . potassium chloride SA (K-DUR,KLOR-CON) 20 MEQ tablet Take 1 tablet (20 mEq total) by mouth daily. 7 tablet 0  . senna-docusate (SENOKOT-S) 8.6-50 MG tablet Take 1 tablet by mouth at bedtime as needed for mild constipation. 10 tablet 0  . traMADol (ULTRAM) 50 MG tablet Take 1-2 tablets (50-100 mg  total) by mouth every 6 (six) hours as needed. 30 tablet 0  . VESICARE 10 MG tablet Take 10 mg by mouth daily.  5  . insulin aspart (NOVOLOG) 100 UNIT/ML injection Inject 2-15 Units into the skin 3 (three) times daily before meals.    . insulin detemir (LEVEMIR) 100 UNIT/ML injection Inject 30 Units into the skin at bedtime. Sliding Scale     No current facility-administered medications for this visit.     No Known Allergies  Review of Systems  General improvement in exercise tolerance appetite and well-being Receiving home physical therapy and occupational therapy Hemolytic anemia being followed by her primary doctor She is not resumed her Januvia-metformin because of renal concerns  BP 122/70   Pulse 80   Ht  (1.549 m)   Wt 155 lb (70.3 kg)   SpO2 99%   BMI 29.29 kg/m  Physical Exam      Exam    General- alert and comfortable   Lungs- clear without rales, wheezes   Cor- regular rate and rhythm, no murmur , gallop   Abdomen- soft, non-tender   Extremities - warm, non-tender, minimal edema   Neuro- oriented, appropriate, no focal weakness   Diagnostic Tests: Chest x-ray clear  Impression: Doing well now at home after multivessel CABG She is not strong enough yet to drive but is encouraged to walk 5 minutes twice a day Continue current meds  Plan: Return in 3 weeks for review of progress and to discuss medication changes and driving.   Mikey Bussing, MD [facility] [phone]

## 2017-05-18 ENCOUNTER — Other Ambulatory Visit: Payer: Self-pay | Admitting: Cardiothoracic Surgery

## 2017-05-18 DIAGNOSIS — R600 Localized edema: Secondary | ICD-10-CM

## 2017-05-30 ENCOUNTER — Telehealth: Payer: Self-pay | Admitting: Interventional Cardiology

## 2017-05-30 NOTE — Telephone Encounter (Signed)
New Message   [name] call requesting to speak with RN about pt starting cardiac rehab. She states a referral has been sent to Dr. Katrinka BlazingSmith. Please call back to discuss

## 2017-05-30 NOTE — Telephone Encounter (Signed)
Spoke with [name] at Cardiac Rehab.  She did not receive the form I sent a couple of weeks ago.  Advised I still have form and will have Dr. Katrinka BlazingSmith sign again and fax over.  [name] appreciative for call.

## 2017-05-31 ENCOUNTER — Telehealth: Payer: Self-pay | Admitting: Interventional Cardiology

## 2017-05-31 ENCOUNTER — Encounter: Payer: Self-pay | Admitting: Interventional Cardiology

## 2017-05-31 NOTE — Telephone Encounter (Signed)
[name]   States pt has gained 5.2lbs since last week.  Denies SOB, lungs are clear.  Pt is having slight swelling in BLE, but no pitting.  Pt told Andrea [name] that she seen Dr. Donata ClayVan Trigt on 10/10 and was told to stop her Furosemide, so she hasn't had it since then.  Pt was started on Tribenzor 40/10/25mg  QD.  PCP recently reduced this to 40/10/12.5mg  QD but pt hasn't picked up the new dose yet.  Advised I would send message to Dr. Katrinka BlazingSmith for review and advisement.

## 2017-05-31 NOTE — Telephone Encounter (Signed)
Spoke with Britt Boozericky and she states pt's O2 has been 98%.  Advised her of recommendations per Dr. Katrinka BlazingSmith.  Britt Boozericky verbalized understanding and was appreciative for call.

## 2017-05-31 NOTE — Telephone Encounter (Signed)
Follow up   Patient wants clarification on which blood pressure medication and dosage of Lasix. Please call

## 2017-05-31 NOTE — Telephone Encounter (Signed)
Spoke with operator, message wasn't showing up for some reason.  She states [facility] is calling because pt had a 5lb wt gain since last week.  Denies pitting edema to LE.  Pt's PCP recently decreased Lasix to 20mg  QD.    Called Nicky back and left message.

## 2017-05-31 NOTE — Telephone Encounter (Signed)
Miss Andrea [name] returned call please give her a call back.     [name]  Georgiann CockerNICKY [name] with [facility] called to report::  Weight gain compared to last week  5lb  Non pitting edema to lower extremities.  There is a question about pt's medication     FUROSEMIDE 40  / PCP decreased           pt's dieretic.  She needs a call back today please if possible.

## 2017-05-31 NOTE — Telephone Encounter (Signed)
Left message to call back  

## 2017-05-31 NOTE — Telephone Encounter (Signed)
Spoke with pt and she was confused about what to do with her medications.  Ad[facility] told us about medication change that Dr. Chestine Sporelark made and that Furosemide was d/c'ed by Dr. Donata ClayVan Trigt.  Advised Dr. Katrinka BlazingSmith was aware and wanted pt to continue with plan for these changes.  Pt states she doesn't know why Furosemide was stopped or why Dr. Chestine Sporelark would decrease dose of HCTZ in her Tribenzor if she is having swelling and wt gain.  Advised pt contact Dr. Donata ClayVan Trigt and Dr. Ophelia Charterlark's office and inquire about these things since those offices we the ones to make the changes and unfortunately, I didn't know the answer.  Pt feels like there are too many people trying to control her medications.  Pt will contact Dr. Chestine Sporelark and Dr. Zenaida NieceVan Trigt's office to discuss these medications and thanked me for calling.

## 2017-05-31 NOTE — Telephone Encounter (Signed)
If O2 sats okay, no shortness of breath or other evidence CHF, recommend continue as recommended.

## 2017-06-01 ENCOUNTER — Other Ambulatory Visit: Payer: Self-pay | Admitting: Interventional Cardiology

## 2017-06-14 ENCOUNTER — Encounter: Payer: Self-pay | Admitting: Cardiothoracic Surgery

## 2017-06-14 ENCOUNTER — Other Ambulatory Visit: Payer: Self-pay

## 2017-06-14 ENCOUNTER — Ambulatory Visit (INDEPENDENT_AMBULATORY_CARE_PROVIDER_SITE_OTHER): Payer: Self-pay | Admitting: Cardiothoracic Surgery

## 2017-06-14 VITALS — BP 104/65 | HR 72 | Ht 61.0 in | Wt 154.0 lb

## 2017-06-14 DIAGNOSIS — Z951 Presence of aortocoronary bypass graft: Secondary | ICD-10-CM

## 2017-06-14 NOTE — Progress Notes (Signed)
PCP is Laurena Slimmerlark, Preston S, MD Referring Provider is Marykay LexHarding, David W, MD  Chief Complaint  Patient presents with  . CABG follow up    HPI: Final 5267-month visit after urgent CABG x4 for unstable angina and non-STEMI. Patient is progressing well now.  She is able to ambulate slowly down the hallway in the office but is still slightly unsteady in turning and making more rapid motion.  She appears to be ready now to enter outpatient cardiac rehab but not quite ready to drive independently.  Fortunately she has friends and family who can drive her to the hospital for phase 2 cardiac rehab and she will set up the orientation process.  She should be able to drive independently in a month.  Her anemia has resolved.  She denies recurrent angina or symptoms of CHF.  All surgical incisions are clean and dry.  There is no significant peripheral edema.  Past Medical History:  Diagnosis Date  . Anemia 04/30/2017  . CHF (congestive heart failure) (HCC)   . Diabetes mellitus   . Hypertension   . Hypokalemia 04/2017  . Macular degeneration disease    loss of central vision on left and decreased peripheral vision.   . Stroke (HCC) 12/2011   no residuals noted    Past Surgical History:  Procedure Laterality Date  . CHOLECYSTECTOMY    . ROTATOR CUFF REPAIR     left    Family History  Problem Relation Age of Onset  . Heart attack Father   . Hypertension Father   . Heart attack Brother   . Hypertension Brother   . Hypertension Mother   . Stroke Mother   . Healthy Brother     Social History Social History   Tobacco Use  . Smoking status: Former Smoker    Packs/day: 0.50    Years: 20.00    Pack years: 10.00    Types: Cigarettes    Last attempt to quit: 01/02/1997    Years since quitting: 20.4  . Smokeless tobacco: Never Used  Substance Use Topics  . Alcohol use: No  . Drug use: No    Current Outpatient Medications  Medication Sig Dispense Refill  . acetaminophen (TYLENOL) 325 MG  tablet Take 2 tablets (650 mg total) by mouth every 6 (six) hours as needed for mild pain (or Fever >/= 101). 30 tablet 0  . aspirin EC 325 MG EC tablet Take 1 tablet (325 mg total) by mouth daily. 30 tablet 0  . atorvastatin (LIPITOR) 80 MG tablet Take 1 tablet (80 mg total) by mouth daily at 6 PM. 30 tablet 1  . bisacodyl (DULCOLAX) 5 MG EC tablet Take 1 tablet (5 mg total) by mouth daily as needed for moderate constipation. 10 tablet 0  . carvedilol (COREG) 6.25 MG tablet Take 1 tablet (6.25 mg total) by mouth 2 (two) times daily with a meal. 60 tablet 1  . Cholecalciferol (VITAMIN D) 2000 UNITS tablet Take 2,000 Units by mouth daily.    . ferrous sulfate 325 (65 FE) MG tablet Take 1 tablet (325 mg total) by mouth 2 (two) times daily with a meal. 60 tablet 0  . furosemide (LASIX) 40 MG tablet Take 0.5 tablets (20 mg total) by mouth daily. 14 tablet 0  . insulin aspart (NOVOLOG) 100 UNIT/ML injection Inject 2-15 Units into the skin 3 (three) times daily before meals.    . insulin detemir (LEVEMIR) 100 UNIT/ML injection Inject 30 Units into the skin at bedtime. Sliding  Scale    . JANUMET 50-1000 MG tablet Take 1 tablet by mouth 2 (two) times daily.  3  . ONETOUCH VERIO test strip USE TO TEST BLOOD SUGAR 4 TIMES DAILY  12  . senna-docusate (SENOKOT-S) 8.6-50 MG tablet Take 1 tablet by mouth at bedtime as needed for mild constipation. 10 tablet 0  . traMADol (ULTRAM) 50 MG tablet Take 1-2 tablets (50-100 mg total) by mouth every 6 (six) hours as needed. 30 tablet 0  . VESICARE 10 MG tablet Take 10 mg by mouth daily.  5  . potassium chloride SA (K-DUR,KLOR-CON) 20 MEQ tablet Take 1 tablet (20 mEq total) by mouth daily. 7 tablet 0   No current facility-administered medications for this visit.     No Known Allergies  Review of Systems  Improved appetite and strength No shortness of breath or fever No blood per rectum or dark stools or abdominal pain  BP 104/65   Pulse 72   Ht 5\' 1"  (1.549 m)    Wt 154 lb (69.9 kg)   SpO2 98%   BMI 29.10 kg/m  Physical Exam      Exam    General- alert and comfortable   Lungs- clear without rales, wheezes   Cor- regular rate and rhythm, no murmur , gallop   Abdomen- soft, non-tender   Extremities - warm, non-tender, minimal edema   Neuro- oriented, appropriate, no focal weakness  Diagnostic Tests: Chest x-ray last month is reviewed and is clear  Impression: Doing very well now 2 months status post urgent CABG.  She will start attending cardiac rehab.  She knows not to lift more than 10-15 pounds until 3 months after surgery.  She will continue the current medications under the direction of her cardiologist. Plan: Patient will return here as needed.   Mikey BussingPeter Van Trigt III, MD [facility] [phone]

## 2017-07-11 NOTE — Progress Notes (Signed)
Cardiology Office Note    Date:  07/12/2017   ID:  Andrea [name], DOB 1946/04/22, MRN [id number]  PCP:  Laurena Slimmerlark, Preston S, MD  Cardiologist: Lesleigh NoeHenry W Smith III, MD   Chief Complaint  Patient presents with  . Coronary Artery Disease    History of Present Illness:  Andrea [name] is a 71 y.o. female with recent coronary bypass grafting (LIMA to LAD, SVG to diagonal, SVG to OM) September 2018 after presenting with non-ST elevation myocardial infarction, diabetes mellitus type 2, hypertension, and prior history of CVA. Acute on chronic right cerebral white matter infarct-with advanced small vessel disease. Left facial droop, 2013)   Andrea [name] is doing okay except she has significant neuropathic pain in her right arm.  If she reaches above her in or stretches the arm forward if she gets pain from her neck to the hand.  She has normal strength.  She has some neck discomfort.  There is no history of cervical disc disease.  Right arm discomfort started after coronary bypass grafting.  She is in cardiac rehab without physical limitations.   Past Medical History:  Diagnosis Date  . Anemia 04/30/2017  . CHF (congestive heart failure) (HCC)   . Diabetes mellitus   . Hypertension   . Hypokalemia 04/2017  . Macular degeneration disease    loss of central vision on left and decreased peripheral vision.   . Stroke (HCC) 12/2011   no residuals noted    Past Surgical History:  Procedure Laterality Date  . CHOLECYSTECTOMY    . CORONARY ARTERY BYPASS GRAFT N/A 04/18/2017   Procedure: CORONARY ARTERY BYPASS GRAFTING (CABG) times four using left internal mammary artery and right leg saphenous vein;  Surgeon: Kerin PernaVan Trigt, Peter, MD;  [facility];  Service: Open Heart Surgery;  Laterality: N/A;  . LEFT HEART CATH AND CORONARY ANGIOGRAPHY N/A 04/17/2017   Procedure: LEFT HEART CATH AND CORONARY ANGIOGRAPHY;  Surgeon: Marykay LexHarding, David W, MD;  [facility];  Service: Cardiovascular;   Laterality: N/A;  . ROTATOR CUFF REPAIR     left  . TEE WITHOUT CARDIOVERSION N/A 04/18/2017   Procedure: TRANSESOPHAGEAL ECHOCARDIOGRAM (TEE);  Surgeon: Donata ClayVan Trigt, Theron AristaPeter, MD;  [facility];  Service: Open Heart Surgery;  Laterality: N/A;    Current Medications: Outpatient Medications Prior to Visit  Medication Sig Dispense Refill  . acetaminophen (TYLENOL) 325 MG tablet Take 2 tablets (650 mg total) by mouth every 6 (six) hours as needed for mild pain (or Fever >/= 101). 30 tablet 0  . aspirin EC 325 MG EC tablet Take 1 tablet (325 mg total) by mouth daily. 30 tablet 0  . bisacodyl (DULCOLAX) 5 MG EC tablet Take 1 tablet (5 mg total) by mouth daily as needed for moderate constipation. 10 tablet 0  . carvedilol (COREG) 6.25 MG tablet Take 1 tablet (6.25 mg total) by mouth 2 (two) times daily with a meal. 60 tablet 1  . Cholecalciferol (VITAMIN D) 2000 UNITS tablet Take 2,000 Units by mouth daily.    . furosemide (LASIX) 40 MG tablet Take 0.5 tablets (20 mg total) by mouth daily. 14 tablet 0  . HUMALOG KWIKPEN 100 UNIT/ML KiwkPen Inject 2-15 Units into the skin 3 (three) times daily before meals. (Per sliding scale)  3  . insulin detemir (LEVEMIR) 100 UNIT/ML injection Inject 30 Units into the skin at bedtime. Sliding Scale    . ONETOUCH VERIO test strip USE TO TEST BLOOD SUGAR 4 TIMES DAILY  12  . senna-docusate (SENOKOT-S) 8.6-50 MG tablet Take 1 tablet by mouth at bedtime as needed for mild constipation. 10 tablet 0  . VESICARE 10 MG tablet Take 10 mg by mouth daily.  5  . traMADol (ULTRAM) 50 MG tablet Take 1-2 tablets (50-100 mg total) by mouth every 6 (six) hours as needed. 30 tablet 0  . atorvastatin (LIPITOR) 80 MG tablet Take 1 tablet (80 mg total) by mouth daily at 6 PM. (Patient not taking: Reported on 07/12/2017) 30 tablet 1  . ferrous sulfate 325 (65 FE) MG tablet Take 1 tablet (325 mg total) by mouth 2 (two) times daily with a meal. (Patient not taking: Reported on 07/12/2017) 60  tablet 0  . insulin aspart (NOVOLOG) 100 UNIT/ML injection Inject 2-15 Units into the skin 3 (three) times daily before meals.    Marland Kitchen JANUMET 50-1000 MG tablet Take 1 tablet by mouth 2 (two) times daily.  3  . potassium chloride SA (K-DUR,KLOR-CON) 20 MEQ tablet Take 1 tablet (20 mEq total) by mouth daily. 7 tablet 0   No facility-administered medications prior to visit.      Allergies:   Patient has no known allergies.   Social History   Socioeconomic History  . Marital status: Married    Spouse name: None  . Number of children: None  . Years of education: None  . Highest education level: None  Social Needs  . Financial resource strain: None  . Food insecurity - worry: None  . Food insecurity - inability: None  . Transportation needs - medical: None  . Transportation needs - non-medical: None  Occupational History  . None  Tobacco Use  . Smoking status: Former Smoker    Packs/day: 0.50    Years: 20.00    Pack years: 10.00    Types: Cigarettes    Last attempt to quit: 01/02/1997    Years since quitting: 20.5  . Smokeless tobacco: Never Used  Substance and Sexual Activity  . Alcohol use: No  . Drug use: No  . Sexual activity: None  Other Topics Concern  . None  Social History Narrative  . None     Family History:  The patient's family history includes Healthy in her brother; Heart attack in her brother and father; Hypertension in her brother, father, and mother; Stroke in her mother.   ROS:   Please see the history of present illness.    Bilateral ankle swelling since vein grafting.  Perform discomfort as mentioned.. All other systems reviewed and are negative.   PHYSICAL EXAM:   VS:  BP 122/64   Pulse (!) 54   Ht 5\' 1"  (1.549 m)   Wt 156 lb (70.8 kg)   BMI 29.48 kg/m    GEN: Well nourished, well developed, in no acute distress  HEENT: normal  Neck: no JVD, carotid bruits, or masses Cardiac: RRR; no murmurs, rubs, or gallops,no edema  Respiratory:  clear to  auscultation bilaterally, normal work of breathing GI: soft, nontender, nondistended, + BS MS: no deformity or atrophy  Skin: warm and dry, no rash Neuro:  Alert and Oriented x 3, Strength and sensation are intact Psych: euthymic mood, full affect  Wt Readings from Last 3 Encounters:  07/12/17 156 lb (70.8 kg)  06/14/17 154 lb (69.9 kg)  05/17/17 155 lb (70.3 kg)      Studies/Labs Reviewed:   EKG:  EKG performed  Recent Labs: 04/17/2017: TSH 2.107 05/04/2017: Magnesium 1.8 05/05/2017: ALT 62; Hemoglobin 8.7;  Platelets 143 05/12/2017: BUN 17; Creatinine, Ser 1.32; Potassium 4.9; Sodium 141   Lipid Panel    Component Value Date/Time   CHOL 117 04/17/2017 0014   TRIG 207 (H) 04/17/2017 0014   HDL 35 (L) 04/17/2017 0014   CHOLHDL 3.3 04/17/2017 0014   VLDL 41 (H) 04/17/2017 0014   LDLCALC 41 04/17/2017 0014    Additional studies/ records that were reviewed today include:  No new data    ASSESSMENT:    1. Coronary artery disease involving coronary bypass graft of native heart with angina pectoris (HCC)   2. Essential hypertension   3. Right arm pain   4. Cerebrovascular accident (CVA) due to other mechanism   5. Non-ST elevation (NSTEMI) myocardial infarction (HCC)   6. Hx of CABG   7. Familial hypercholesterolemia   8. Post-op pain      PLAN:  In order of problems listed above:  1. Status post multivessel bypass surgery without incident complaints. 2. Blood pressures under excellent control. 3. Right arm pain is consistent with neurogenic origin.  Possibly related to brachial plexus injury at the time of surgery since it started postoperatively.  Rule out cervical disc disease.  Continue physical therapy and observation for now.  Have rewritten a limited supply of tramadol. 4. Not addressed  Clinical follow-up.    Medication Adjustments/Labs and Tests Ordered: Current medicines are reviewed at length with the patient today.  Concerns regarding medicines are  outlined above.  Medication changes, Labs and Tests ordered today are listed in the Patient Instructions below. Patient Instructions  Medication Instructions:  1) You make take one Tramadol 50mg  every 8 hours as needed for pain.   Labwork: None  Testing/Procedures: None  Follow-Up: Your physician wants you to follow-up in: 1 year with Dr. Katrinka BlazingSmith. You will receive a reminder letter in the mail two months in advance. If you don't receive a letter, please call our office to schedule the follow-up appointment.   Any Other Special Instructions Will Be Listed Below (If Applicable).     If you need a refill on your cardiac medications before your next appointment, please call your pharmacy.      Signed, Lesleigh NoeHenry W Smith III, MD  07/12/2017 3:11 PM    [facility] [address], [place], [place]  [address] Phone: [phone](336) [phone]; Fax: [phone]) [phone]

## 2017-07-12 ENCOUNTER — Encounter: Payer: Self-pay | Admitting: Interventional Cardiology

## 2017-07-12 ENCOUNTER — Ambulatory Visit: Payer: Medicare Other | Admitting: Interventional Cardiology

## 2017-07-12 VITALS — BP 122/64 | HR 54 | Ht 61.0 in | Wt 156.0 lb

## 2017-07-12 DIAGNOSIS — I25709 Atherosclerosis of coronary artery bypass graft(s), unspecified, with unspecified angina pectoris: Secondary | ICD-10-CM | POA: Diagnosis not present

## 2017-07-12 DIAGNOSIS — I6389 Other cerebral infarction: Secondary | ICD-10-CM

## 2017-07-12 DIAGNOSIS — M79601 Pain in right arm: Secondary | ICD-10-CM | POA: Diagnosis not present

## 2017-07-12 DIAGNOSIS — I214 Non-ST elevation (NSTEMI) myocardial infarction: Secondary | ICD-10-CM | POA: Diagnosis not present

## 2017-07-12 DIAGNOSIS — Z951 Presence of aortocoronary bypass graft: Secondary | ICD-10-CM | POA: Diagnosis not present

## 2017-07-12 DIAGNOSIS — E7801 Familial hypercholesterolemia: Secondary | ICD-10-CM | POA: Diagnosis not present

## 2017-07-12 DIAGNOSIS — I10 Essential (primary) hypertension: Secondary | ICD-10-CM

## 2017-07-12 DIAGNOSIS — G8918 Other acute postprocedural pain: Secondary | ICD-10-CM

## 2017-07-12 MED ORDER — TRAMADOL HCL 50 MG PO TABS: ORAL_TABLET | ORAL | 0 refills | Status: DC

## 2017-07-12 NOTE — Patient Instructions (Signed)
Medication Instructions:  1) You make take one Tramadol 50mg  every 8 hours as needed for pain.   Labwork: None  Testing/Procedures: None  Follow-Up: Your physician wants you to follow-up in: 1 year with Dr. Katrinka BlazingSmith. You will receive a reminder letter in the mail two months in advance. If you don't receive a letter, please call our office to schedule the follow-up appointment.   Any Other Special Instructions Will Be Listed Below (If Applicable).     If you need a refill on your cardiac medications before your next appointment, please call your pharmacy.

## 2017-09-07 ENCOUNTER — Other Ambulatory Visit: Payer: Self-pay

## 2017-09-07 ENCOUNTER — Encounter: Payer: Self-pay | Admitting: Physical Therapy

## 2017-09-07 ENCOUNTER — Ambulatory Visit: Payer: Medicare Other | Attending: Family Medicine | Admitting: Physical Therapy

## 2017-09-07 DIAGNOSIS — R293 Abnormal posture: Secondary | ICD-10-CM | POA: Diagnosis present

## 2017-09-07 DIAGNOSIS — M25611 Stiffness of right shoulder, not elsewhere classified: Secondary | ICD-10-CM | POA: Diagnosis present

## 2017-09-07 DIAGNOSIS — R29898 Other symptoms and signs involving the musculoskeletal system: Secondary | ICD-10-CM | POA: Diagnosis present

## 2017-09-07 DIAGNOSIS — M25511 Pain in right shoulder: Secondary | ICD-10-CM | POA: Diagnosis not present

## 2017-09-07 NOTE — Therapy (Signed)
[facility] Outpatient Rehabilitation [facility] [address]  [address] [place], [place], [address] Phone: [phone]   Fax:  [phone]  Physical Therapy Evaluation  Patient Details  Name: Andrea [name] MRN: [id number] Date of Birth: August 27, 1945 Referring Provider: Dr. Eula Listen   Encounter Date: 09/07/2017  PT End of Session - 09/07/17 1524    Visit Number  1    Number of Visits  12    Date for PT Re-Evaluation  10/19/17    Authorization Type  UHC Medicare    PT Start Time  1530    PT Stop Time  1615    PT Time Calculation (min)  45 min    Activity Tolerance  Patient tolerated treatment well    Behavior During Therapy  [facility] for tasks assessed/performed       Past Medical History:  Diagnosis Date  . Anemia 04/30/2017  . CHF (congestive heart failure) (HCC)   . Diabetes mellitus   . Hypertension   . Hypokalemia 04/2017  . Macular degeneration disease    loss of central vision on left and decreased peripheral vision.   . Stroke (HCC) 12/2011   no residuals noted    Past Surgical History:  Procedure Laterality Date  . CHOLECYSTECTOMY    . CORONARY ARTERY BYPASS GRAFT N/A 04/18/2017   Procedure: CORONARY ARTERY BYPASS GRAFTING (CABG) times four using left internal mammary artery and right leg saphenous vein;  Surgeon: Kerin Perna, MD;  Location: [facility] OR;  Service: Open Heart Surgery;  Laterality: N/A;  . LEFT HEART CATH AND CORONARY ANGIOGRAPHY N/A 04/17/2017   Procedure: LEFT HEART CATH AND CORONARY ANGIOGRAPHY;  Surgeon: Marykay Lex, MD;  Location: [facility] INVASIVE CV LAB;  Service: Cardiovascular;  Laterality: N/A;  . ROTATOR CUFF REPAIR     left  . TEE WITHOUT CARDIOVERSION N/A 04/18/2017   Procedure: TRANSESOPHAGEAL ECHOCARDIOGRAM (TEE);  Surgeon: Donata Clay, Theron Arista, MD;  Location: [facility] OR;  Service: Open Heart Surgery;  Laterality: N/A;    There were no vitals filed for this visit.   Subjective Assessment - 09/07/17 1531    Subjective  Patient reporting R shoulder and bicep pain. Had cortisone injection on 08/29/17 of which she reports is wearing off. Has pain at night - difficulty sleeping - only sleeping up to 3 hours. Difficulty reaching with R UE. Denies N&T into arm and hand. difficulty performing ADLs due to pain.     Pertinent History  CABG x4, DM, HTN    Diagnostic tests  Xray: arthritis    Patient Stated Goals  improve pain and function of R UE    Currently in Pain?  Yes    Pain Score  2  4-5/10 with movements    Pain Location  Shoulder    Pain Orientation  Right    Pain Descriptors / Indicators  Aching;Sore    Pain Type  Acute pain    Pain Onset  More than a month ago    Pain Frequency  Intermittent    Aggravating Factors   movement    Pain Relieving Factors  hot pack, biofreeze         [facility] PT Assessment - 09/07/17 1523      Assessment   Medical Diagnosis  R RTC syndrome, adhesive capsulitis, and OA    Referring Provider  Dr. Eula Listen    Onset Date/Surgical Date  -- November 2018    Hand Dominance  Right    Next MD  Visit  prn    Prior Therapy  no      Precautions   Precautions  None      Restrictions   Weight Bearing Restrictions  No      Balance Screen   Has the patient fallen in the past 6 months  No    Has the patient had a decrease in activity level because of a fear of falling?   No    Is the patient reluctant to leave their home because of a fear of falling?   No      Prior Function   Level of Independence  Independent    Vocation  Retired      CopyCognition   Overall Cognitive Status  Within Functional Limits for tasks assessed      Observation/Other Assessments   Focus on Therapeutic Outcomes (FOTO)   Shoulder: 49 (51% limited, predicted 39% limited)      Sensation   Light Touch  Appears Intact      Coordination   Gross Motor Movements are Fluid and Coordinated  No      Posture/Postural Control   Posture/Postural Control  Postural limitations    Postural  Limitations  Rounded Shoulders;Forward head      ROM / Strength   AROM / PROM / Strength  AROM;PROM;Strength      AROM   AROM Assessment Site  Shoulder    Right/Left Shoulder  Right    Right Shoulder Flexion  96 Degrees    Right Shoulder ABduction  55 Degrees    Right Shoulder Internal Rotation  -- FIR to ~ iliac crest    Right Shoulder External Rotation  -- FER to ~ lateral ear      PROM   PROM Assessment Site  Shoulder    Right/Left Shoulder  Right    Right Shoulder Flexion  131 Degrees    Right Shoulder ABduction  75 Degrees    Right Shoulder Internal Rotation  58 Degrees    Right Shoulder External Rotation  35 Degrees      Strength   Overall Strength Comments  gross strength R UE 3-/5      Palpation   Palpation comment  TTP at R shoulder complex             Objective measurements completed on examination: See above findings.      [facility]/Exercise - 09/07/17 1523      Exercises   Exercises  Shoulder      Shoulder Exercises: Supine   External Rotation  AAROM;Right;10 reps    Flexion  AAROM;Right;10 reps    ABduction  AAROM;Right;10 reps      Shoulder Exercises: Seated   Retraction  Both;5 reps             PT Education - 09/07/17 1523    Education provided  Yes    Education Details  exam findings, POC, HEP    Person(s) Educated  Patient    Methods  Explanation;Demonstration    Comprehension  Verbalized understanding;Returned demonstration       PT Short Term Goals - 09/07/17 1802      PT SHORT TERM GOAL #1   Title  patient to be independent with initial HEP    Status  New    Target Date  09/28/17      PT SHORT TERM GOAL #2   Title  patient ot demonstrate PROM R shoulder to WNL wihtout pain limiting  Status  New    Target Date  09/28/17        PT Long Term Goals - 09/07/17 1803      PT LONG TERM GOAL #1   Title  patient to be independent with advanced HEP    Status  New    Target Date  10/19/17      PT LONG  TERM GOAL #2   Title  patient to demonstrate R shoulder AROM to WNL without pain limiting    Status  New    Target Date  10/19/17      PT LONG TERM GOAL #3   Title  patient to report ability to perform ADLs and household tasks without limitations at R shoulder    Status  New    Target Date  10/19/17      PT LONG TERM GOAL #4   Title  patient to demosntrate R shoulder strength to >/= 4/5    Status  New    Target Date  10/19/17             Plan - 09/07/17 1758    Clinical Impression Statement  Andrea [name] is a pleasant 72 y/o female presenting to OPPT today regarding primary complaints of R shoulder pain and inability to perform functional tasks with R UE. Patient today wtih limited AROM, PROM, and strength at R shoulder with pain limiting. Patient given initial HEP today for gentle stretching with good tolerance. Patient to beneift from PT to address the above listed deficits to allow for improved funcitonal use of R UE and overall QOL.    Clinical Presentation  Stable    Clinical Decision Making  Low    Rehab Potential  Good    PT Frequency  2x / week    PT Duration  6 weeks    PT Treatment/Interventions  ADLs/Self Care Home Management;Cryotherapy;Electrical Stimulation;Iontophoresis 4mg /ml Dexamethasone;Moist Heat;Therapeutic exercise;Therapeutic activities;Ultrasound;Neuromuscular re-education;Patient/family education;Manual techniques;Vasopneumatic Device;Taping;Dry needling;Passive range of motion    Consulted and Agree with Plan of Care  Patient       Patient will benefit from skilled therapeutic intervention in order to improve the following deficits and impairments:  Pain, Impaired UE functional use, Decreased strength, Decreased range of motion  Visit Diagnosis: Acute pain of right shoulder  Stiffness of right shoulder, not elsewhere classified  Abnormal posture  Other symptoms and signs involving the musculoskeletal system     Problem List Patient Active  Problem List   Diagnosis Date Noted  . Pressure injury of skin 05/01/2017  . Coronary artery disease involving coronary bypass graft of native heart with angina pectoris (HCC) 04/30/2017  . SIRS (systemic inflammatory response syndrome) (HCC) 04/30/2017  . Symptomatic anemia 04/30/2017  . Hyperbilirubinemia 04/30/2017  . AKI (acute kidney injury) (HCC) 04/30/2017  . History of CVA in adulthood 04/30/2017  . Fever in adult   . Hypokalemia   . Jaundice   . Hx of CABG 04/18/2017  . Non-ST elevation (NSTEMI) myocardial infarction (HCC)   . Hyperlipidemia 03/14/2016  . Hypertensive heart disease 12/17/2015  . Diabetes mellitus, type II, insulin dependent (HCC) 01/04/2012  . Hypertension   . Stroke [facility])      Kipp Laurence, PT, DPT 09/07/17 6:04 PM   [facility] Out[facility] Rehabilitation [facility] [address]  [address] [place], [place], [address] Phone: [phone]   Fax:  [phone]  Name: Andrea [name] MRN: [id number] Date of Birth: 1945/12/14

## 2017-09-07 NOTE — Patient Instructions (Signed)
SHOULDER: Flexion - Supine (Cane)   Hold cane in both hands. Raise arms up overhead. Do not allow back to arch. Hold _5-10_ seconds. _10-15__ reps per set  Cane Exercise: Abduction   Hold cane with right hand over end, palm-up, with other hand palm-down. Move arm out from side and up by pushing with other arm. Hold __5-10__ seconds. Repeat __10-15__ times.   External Rotation (Eccentric), Active-Assist - Supine (Cane)   Lie on back, affected arm out from side, elbow at 90, forearm forward. Use cane to assist in lifting forearm of affected arm to neutral. Slowly lower for 3-5 seconds. _10-15__ reps per set  Scapular Retraction (Standing)   With arms at sides, pinch shoulder blades together. Repeat _15___ times per set.

## 2017-09-12 ENCOUNTER — Encounter: Payer: Self-pay | Admitting: Physical Therapy

## 2017-09-12 ENCOUNTER — Ambulatory Visit: Payer: Medicare Other | Attending: Family Medicine | Admitting: Physical Therapy

## 2017-09-12 DIAGNOSIS — R29898 Other symptoms and signs involving the musculoskeletal system: Secondary | ICD-10-CM

## 2017-09-12 DIAGNOSIS — R293 Abnormal posture: Secondary | ICD-10-CM

## 2017-09-12 DIAGNOSIS — M25511 Pain in right shoulder: Secondary | ICD-10-CM

## 2017-09-12 DIAGNOSIS — M25611 Stiffness of right shoulder, not elsewhere classified: Secondary | ICD-10-CM | POA: Diagnosis present

## 2017-09-12 NOTE — Patient Instructions (Signed)
Wall Wash

## 2017-09-12 NOTE — Therapy (Signed)
[facility] [address]  [address] [place], [place], [address] Phone: [phone]   Fax:  [phone]  Physical Therapy Treatment  Patient Details  Name: Andrea Mandrilamela I [name] MRN: [id number] Date of Birth: [birth year] Referring Provider: Dr. Eula Listenominic McKinley   Encounter Date: 09/12/2017  PT End of Session - 09/12/17 1623    Visit Number  2    Number of Visits  12    Date for PT Re-Evaluation  10/19/17    Authorization Type  UHC Medicare    PT Start Time  1402    PT Stop Time  1445    PT Time Calculation (min)  43 min    Activity Tolerance  Patient tolerated treatment well    Behavior During Therapy  [facility] for tasks assessed/performed       Past Medical History:  Diagnosis Date  . Anemia 04/30/2017  . CHF (congestive heart failure) (HCC)   . Diabetes mellitus   . Hypertension   . Hypokalemia 04/2017  . Macular degeneration disease    loss of central vision on left and decreased peripheral vision.   . Stroke (HCC) 12/2011   no residuals noted    Past Surgical History:  Procedure Laterality Date  . CHOLECYSTECTOMY    . CORONARY ARTERY BYPASS GRAFT N/A 04/18/2017   Procedure: CORONARY ARTERY BYPASS GRAFTING (CABG) times four using left internal mammary artery and right leg saphenous vein;  Surgeon: Kerin PernaVan Trigt, Peter, MD;  [facility];  Service: Open Heart Surgery;  Laterality: N/A;  . LEFT HEART CATH AND CORONARY ANGIOGRAPHY N/A 04/17/2017   Procedure: LEFT HEART CATH AND CORONARY ANGIOGRAPHY;  Surgeon: Marykay LexHarding, David W, MD;  [facility];  Service: Cardiovascular;  Laterality: N/A;  . ROTATOR CUFF REPAIR     left  . TEE WITHOUT CARDIOVERSION N/A 04/18/2017   Procedure: TRANSESOPHAGEAL ECHOCARDIOGRAM (TEE);  Surgeon: Donata ClayVan Trigt, Theron AristaPeter, MD;  [facility];  Service: Open Heart Surgery;  Laterality: N/A;    There were no vitals filed for this visit.  Subjective Assessment - 09/12/17 1621    Subjective   has some questions about HEP    Pertinent History  CABG x4, DM, HTN    Diagnostic tests  Xray: arthritis    Patient Stated Goals  improve pain and function of R UE    Currently in Pain?  Yes    Pain Score  3     Pain Location  Shoulder    Pain Orientation  Right    Pain Descriptors / Indicators  Aching                      [facility] Adult PT Treatment/Exercise - 09/12/17 1429      Shoulder Exercises: Supine   External Rotation  AAROM;Right;5 reps    Flexion  AAROM;Right;5 reps    ABduction  AAROM;Right;5 reps      Shoulder Exercises: Pulleys   Flexion  3 minutes    ABduction  3 minutes scaption      Shoulder Exercises: ROM/Strengthening   Wall Wash  10 reps flexion x 10; abduction x 10      Manual Therapy   Manual Therapy  Soft tissue mobilization;Passive ROM    Manual therapy comments  patient hooklying    Soft tissue mobilization  STM to R shoulder complex - some TTP along infraspinatus and teres/lats group    Passive ROM  PROM of R GH joint  all planes - pain at end ranges of all motion               PT Short Term Goals - 09/12/17 1624      PT SHORT TERM GOAL #1   Title  patient to be independent with initial HEP    Status  On-going      PT SHORT TERM GOAL #2   Title  patient ot demonstrate PROM R shoulder to WNL wihtout pain limiting    Status  On-going        PT Long Term Goals - 09/12/17 1624      PT LONG TERM GOAL #1   Title  patient to be independent with advanced HEP    Status  On-going      PT LONG TERM GOAL #2   Title  patient to demonstrate R shoulder AROM to WNL without pain limiting    Status  On-going      PT LONG TERM GOAL #3   Title  patient to report ability to perform ADLs and household tasks without limitations at R shoulder    Status  On-going      PT LONG TERM GOAL #4   Title  patient to demosntrate R shoulder strength to >/= 4/5    Status  On-going            Plan - 09/12/17 1624    Clinical Impression  Statement  patient doing well today - reports some compliance with HEP. Needed review today to ensure proper motion. Good tolerance to all manula stretching - still limited and painful in all planes, but seemingly improved motion since initial evaluation. Addition of wall wash activity to HEP today with good understanding. Will continue to progress towards goals as able.    PT Treatment/Interventions  ADLs/Self Care Home Management;Cryotherapy;Electrical Stimulation;Iontophoresis 4mg /ml Dexamethasone;Moist Heat;Therapeutic exercise;Therapeutic activities;Ultrasound;Neuromuscular re-education;Patient/family education;Manual techniques;Vasopneumatic Device;Taping;Dry needling;Passive range of motion    Consulted and Agree with Plan of Care  Patient       Patient will benefit from skilled therapeutic intervention in order to improve the following deficits and impairments:  Pain, Impaired UE functional use, Decreased strength, Decreased range of motion  Visit Diagnosis: Acute pain of right shoulder  Stiffness of right shoulder, not elsewhere classified  Abnormal posture  Other symptoms and signs involving the musculoskeletal system     Problem List Patient Active Problem List   Diagnosis Date Noted  . Pressure injury of skin 05/01/2017  . Coronary artery disease involving coronary bypass graft of native heart with angina pectoris (HCC) 04/30/2017  . SIRS (systemic inflammatory response syndrome) (HCC) 04/30/2017  . Symptomatic anemia 04/30/2017  . Hyperbilirubinemia 04/30/2017  . AKI (acute kidney injury) (HCC) 04/30/2017  . History of CVA in adulthood 04/30/2017  . Fever in adult   . Hypokalemia   . Jaundice   . Hx of CABG 04/18/2017  . Non-ST elevation (NSTEMI) myocardial infarction (HCC)   . Hyperlipidemia 03/14/2016  . Hypertensive heart disease 12/17/2015  . Diabetes mellitus, type II, insulin dependent (HCC) 01/04/2012  . Hypertension   . Stroke [facility])     Kipp Laurence, PT, DPT 09/12/17 4:26 PM   [facility] [address]  [address] [place], [place], [address] Phone: [phone]   Fax:  [phone]  Name: KRISTALYN BERGSTRESSER MRN: [id number] Date of Birth: 1945/10/02

## 2017-09-14 ENCOUNTER — Ambulatory Visit: Payer: Medicare Other | Admitting: Physical Therapy

## 2017-09-14 ENCOUNTER — Encounter: Payer: Self-pay | Admitting: Physical Therapy

## 2017-09-14 DIAGNOSIS — R29898 Other symptoms and signs involving the musculoskeletal system: Secondary | ICD-10-CM

## 2017-09-14 DIAGNOSIS — R293 Abnormal posture: Secondary | ICD-10-CM

## 2017-09-14 DIAGNOSIS — M25611 Stiffness of right shoulder, not elsewhere classified: Secondary | ICD-10-CM

## 2017-09-14 DIAGNOSIS — M25511 Pain in right shoulder: Secondary | ICD-10-CM | POA: Diagnosis not present

## 2017-09-14 NOTE — Patient Instructions (Signed)
Resistive Band Rowing    With resistive band anchored in door, grasp both ends. Keeping elbows bent, pull back, squeezing shoulder blades together. Hold __5__ seconds. Repeat _15___ times. Do __2__ sessions per day.   

## 2017-09-14 NOTE — Therapy (Signed)
[facility] Outpatient Rehabilitation [facility] [address]  [address] [place], [place], [address] Phone: [phone]   Fax:  [phone]  Physical Therapy Treatment  Patient Details  Name: Andrea [name] MRN: [id number] Date of Birth: 30-Aug-1945 Referring Provider: Dr. Eula Listen   Encounter Date: 09/14/2017  PT End of Session - 09/14/17 1533    Visit Number  3    Number of Visits  12    Date for PT Re-Evaluation  10/19/17    Authorization Type  UHC Medicare    PT Start Time  1530    PT Stop Time  1610    PT Time Calculation (min)  40 min    Activity Tolerance  Patient tolerated treatment well    Behavior During Therapy  [facility] for tasks assessed/performed       Past Medical History:  Diagnosis Date  . Anemia 04/30/2017  . CHF (congestive heart failure) (HCC)   . Diabetes mellitus   . Hypertension   . Hypokalemia 04/2017  . Macular degeneration disease    loss of central vision on left and decreased peripheral vision.   . Stroke (HCC) 12/2011   no residuals noted    Past Surgical History:  Procedure Laterality Date  . CHOLECYSTECTOMY    . CORONARY ARTERY BYPASS GRAFT N/A 04/18/2017   Procedure: CORONARY ARTERY BYPASS GRAFTING (CABG) times four using left internal mammary artery and right leg saphenous vein;  Surgeon: Kerin Perna, MD;  Location: [facility] OR;  Service: Open Heart Surgery;  Laterality: N/A;  . LEFT HEART CATH AND CORONARY ANGIOGRAPHY N/A 04/17/2017   Procedure: LEFT HEART CATH AND CORONARY ANGIOGRAPHY;  Surgeon: Marykay Lex, MD;  Location: [facility] INVASIVE CV LAB;  Service: Cardiovascular;  Laterality: N/A;  . ROTATOR CUFF REPAIR     left  . TEE WITHOUT CARDIOVERSION N/A 04/18/2017   Procedure: TRANSESOPHAGEAL ECHOCARDIOGRAM (TEE);  Surgeon: Donata Clay, Theron Arista, MD;  Location[facility] OR;  Service: Open Heart Surgery;  Laterality: N/A;    There were no vitals filed for this visit.  Subjective Assessment - 09/14/17 1531    Subjective   had cardiac rehab today - has been doing homework    Pertinent History  CABG x4, DM, HTN    Diagnostic tests  Xray: arthritis    Patient Stated Goals  improve pain and function of R UE    Currently in Pain?  Yes    Pain Location  Shoulder    Pain Orientation  Right    Pain Descriptors / Indicators  Tightness;Aching    Pain Type  Acute pain                      [facility] Adult PT Treatment/Exercise - 09/14/17 1534      Shoulder Exercises: Standing   External Rotation  Strengthening;Right;15 reps;Theraband    Theraband Level (Shoulder External Rotation)  Level 2 (Red)    Internal Rotation  Strengthening;Right;15 reps;Theraband    Theraband Level (Shoulder Internal Rotation)  Level 2 (Red)    Row  Strengthening;Both;15 reps;Theraband    Theraband Level (Shoulder Row)  Level 2 (Red)      Shoulder Exercises: Pulleys   Flexion  3 minutes    ABduction  3 minutes scaption      Shoulder Exercises: ROM/Strengthening   Wall Wash  15 reps - forward flexion    Other ROM/Strengthening Exercises  ball walkouts on mat table x 10 for 5 sec hold  Manual Therapy   Manual Therapy  Joint mobilization;Passive ROM    Manual therapy comments  patient hooklying    Joint Mobilization  long axis distraction 3 x 30 seconds    Passive ROM  PROM of R GH joint all planes - pain at end ranges of all motion               PT Short Term Goals - 09/12/17 1624      PT SHORT TERM GOAL #1   Title  patient to be independent with initial HEP    Status  On-going      PT SHORT TERM GOAL #2   Title  patient ot demonstrate PROM R shoulder to WNL wihtout pain limiting    Status  On-going        PT Long Term Goals - 09/12/17 1624      PT LONG TERM GOAL #1   Title  patient to be independent with advanced HEP    Status  On-going      PT LONG TERM GOAL #2   Title  patient to demonstrate R shoulder AROM to WNL without pain limiting    Status  On-going      PT LONG TERM GOAL #3    Title  patient to report ability to perform ADLs and household tasks without limitations at R shoulder    Status  On-going      PT LONG TERM GOAL #4   Title  patient to demosntrate R shoulder strength to >/= 4/5    Status  On-going            Plan - 09/14/17 1533    Clinical Impression Statement  Andrea [name] doing well today - does continue to have limitations in ROM at R shoulder with pain at end ranges of motion. TOlerable to all stretching and RTC strengthening. Updated HEP to include resisted rows with good tolerance.     PT Treatment/Interventions  ADLs/Self Care Home Management;Cryotherapy;Electrical Stimulation;Iontophoresis 4mg /ml Dexamethasone;Moist Heat;Therapeutic exercise;Therapeutic activities;Ultrasound;Neuromuscular re-education;Patient/family education;Manual techniques;Vasopneumatic Device;Taping;Dry needling;Passive range of motion    Consulted and Agree with Plan of Care  Patient       Patient will benefit from skilled therapeutic intervention in order to improve the following deficits and impairments:  Pain, Impaired UE functional use, Decreased strength, Decreased range of motion  Visit Diagnosis: Acute pain of right shoulder  Stiffness of right shoulder, not elsewhere classified  Abnormal posture  Other symptoms and signs involving the musculoskeletal system     Problem List Patient Active Problem List   Diagnosis Date Noted  . Pressure injury of skin 05/01/2017  . Coronary artery disease involving coronary bypass graft of native heart with angina pectoris (HCC) 04/30/2017  . SIRS (systemic inflammatory response syndrome) (HCC) 04/30/2017  . Symptomatic anemia 04/30/2017  . Hyperbilirubinemia 04/30/2017  . AKI (acute kidney injury) (HCC) 04/30/2017  . History of CVA in adulthood 04/30/2017  . Fever in adult   . Hypokalemia   . Jaundice   . Hx of CABG 04/18/2017  . Non-ST elevation (NSTEMI) myocardial infarction (HCC)   . Hyperlipidemia 03/14/2016  .  Hypertensive heart disease 12/17/2015  . Diabetes mellitus, type II, insulin dependent (HCC) 01/04/2012  . Hypertension   . Stroke [facility])      Kipp Laurence, PT, DPT 09/14/17 5:41 PM   [facility] [address]  [address] [place], [place], [address] Phone: [phone]   Fax:  [phone]  Name: Andrea [name] MRN: [id number] Date of Birth: 09/03/1945

## 2017-09-15 ENCOUNTER — Encounter: Payer: Self-pay | Admitting: Neurology

## 2017-09-15 ENCOUNTER — Other Ambulatory Visit: Payer: Self-pay | Admitting: *Deleted

## 2017-09-15 DIAGNOSIS — M792 Neuralgia and neuritis, unspecified: Secondary | ICD-10-CM

## 2017-09-18 ENCOUNTER — Ambulatory Visit: Payer: Medicare Other

## 2017-09-18 DIAGNOSIS — R293 Abnormal posture: Secondary | ICD-10-CM

## 2017-09-18 DIAGNOSIS — M25611 Stiffness of right shoulder, not elsewhere classified: Secondary | ICD-10-CM

## 2017-09-18 DIAGNOSIS — M25511 Pain in right shoulder: Secondary | ICD-10-CM | POA: Diagnosis not present

## 2017-09-18 DIAGNOSIS — R29898 Other symptoms and signs involving the musculoskeletal system: Secondary | ICD-10-CM

## 2017-09-18 NOTE — Therapy (Signed)
[facility] Outpatient Rehabilitation [facility] [address]  [address] [place], [place], [address] Phone: [phone]   Fax:  [phone]  Physical Therapy Treatment  Patient Details  Name: Andrea [name] MRN: [id number] Date of Birth: Dec 30, 1945 Referring Provider: Dr. Eula Listen   Encounter Date: 09/18/2017  PT End of Session - 09/18/17 1410    Visit Number  4    Number of Visits  12    Date for PT Re-Evaluation  10/19/17    Authorization Type  UHC Medicare    PT Start Time  1406    PT Stop Time  1450    PT Time Calculation (min)  44 min    Activity Tolerance  Patient tolerated treatment well    Behavior During Therapy  [facility] for tasks assessed/performed       Past Medical History:  Diagnosis Date  . Anemia 04/30/2017  . CHF (congestive heart failure) (HCC)   . Diabetes mellitus   . Hypertension   . Hypokalemia 04/2017  . Macular degeneration disease    loss of central vision on left and decreased peripheral vision.   . Stroke (HCC) 12/2011   no residuals noted    Past Surgical History:  Procedure Laterality Date  . CHOLECYSTECTOMY    . CORONARY ARTERY BYPASS GRAFT N/A 04/18/2017   Procedure: CORONARY ARTERY BYPASS GRAFTING (CABG) times four using left internal mammary artery and right leg saphenous vein;  Surgeon: Kerin Perna, MD;  Location: [facility] OR;  Service: Open Heart Surgery;  Laterality: N/A;  . LEFT HEART CATH AND CORONARY ANGIOGRAPHY N/A 04/17/2017   Procedure: LEFT HEART CATH AND CORONARY ANGIOGRAPHY;  Surgeon: Marykay Lex, MD;  Location: [facility] INVASIVE CV LAB;  Service: Cardiovascular;  Laterality: N/A;  . ROTATOR CUFF REPAIR     left  . TEE WITHOUT CARDIOVERSION N/A 04/18/2017   Procedure: TRANSESOPHAGEAL ECHOCARDIOGRAM (TEE);  Surgeon: Donata Clay, Theron Arista, MD;  Location: [facility] OR;  Service: Open Heart Surgery;  Laterality: N/A;    There were no vitals filed for this visit.  Subjective Assessment - 09/18/17 1410    Subjective   Pt. noting blood suger >300 this morning and reports unsure of current blood sugar level.      Pertinent History  CABG x4, DM, HTN    Patient Stated Goals  improve pain and function of R UE    Currently in Pain?  Yes    Pain Score  3     Pain Location  Shoulder    Pain Orientation  Right    Pain Descriptors / Indicators  Tightness;Aching    Pain Type  Acute pain    Pain Onset  More than a month ago    Pain Frequency  Intermittent    Aggravating Factors   movement     Pain Relieving Factors  hot pack, biofreeze     Multiple Pain Sites  No                      [facility] Adult PT Treatment/Exercise - 09/18/17 1413      Shoulder Exercises: Seated   Flexion  AAROM;Right;10 reps 5" hold     Flexion Limitations  green p-ball roll outs       Shoulder Exercises: Standing   External Rotation  Strengthening;Right;15 reps;Theraband    Theraband Level (Shoulder External Rotation)  Level 2 (Red)    Internal Rotation  Strengthening;Right;15 reps;Theraband    Theraband Level (Shoulder Internal  Rotation)  Level 2 (Red)    Row  Strengthening;Both;15 reps;Theraband    Theraband Level (Shoulder Row)  Level 2 (Red)      Shoulder Exercises: Pulleys   Flexion  3 minutes    ABduction  3 minutes      Shoulder Exercises: ROM/Strengthening   Wall Wash  flexion x 10 reps with 5" stretch       Manual Therapy   Manual Therapy  Joint mobilization;Passive ROM    Manual therapy comments  patient hooklying    Joint Mobilization  long axis distraction 3 x 30 seconds    Soft tissue mobilization  STM to R shoulder complex to promote muscular relaxation     Passive ROM  PROM of R GH joint all planes with prolonged holds - pain at end ranges of all motion               PT Short Term Goals - 09/12/17 1624      PT SHORT TERM GOAL #1   Title  patient to be independent with initial HEP    Status  On-going      PT SHORT TERM GOAL #2   Title  patient ot demonstrate PROM R shoulder to WNL  wihtout pain limiting    Status  On-going        PT Long Term Goals - 09/12/17 1624      PT LONG TERM GOAL #1   Title  patient to be independent with advanced HEP    Status  On-going      PT LONG TERM GOAL #2   Title  patient to demonstrate R shoulder AROM to WNL without pain limiting    Status  On-going      PT LONG TERM GOAL #3   Title  patient to report ability to perform ADLs and household tasks without limitations at R shoulder    Status  On-going      PT LONG TERM GOAL #4   Title  patient to demosntrate R shoulder strength to >/= 4/5    Status  On-going            Plan - 09/18/17 1413    Clinical Impression Statement  Pt. seen to start treatment reporting blood sugar earlier today taken at >300 mg/dl however insisting blood sugar levels well controlled now.  Treatment focused on gentle ROM and strengthening activities, which were tolerated well.  Supervising PT requesting pt. to perform self-check of blood sugar to end treatment with reading at 344 mg/dl.  Pt. asymptomatic throughout treatment today without incidence of dizziness or lightheadedness.  Pt. encouraged to be vigilant checking and maintaining blood sugar levels today and accompanied by therapist out of clinic to parking lot.  Will continue to progress toward goals in coming visits.    PT Treatment/Interventions  ADLs/Self Care Home Management;Cryotherapy;Electrical Stimulation;Iontophoresis 4mg /ml Dexamethasone;Moist Heat;Therapeutic exercise;Therapeutic activities;Ultrasound;Neuromuscular re-education;Patient/family education;Manual techniques;Vasopneumatic Device;Taping;Dry needling;Passive range of motion    Consulted and Agree with Plan of Care  Patient       Patient will benefit from skilled therapeutic intervention in order to improve the following deficits and impairments:  Pain, Impaired UE functional use, Decreased strength, Decreased range of motion  Visit Diagnosis: Acute pain of right  shoulder  Stiffness of right shoulder, not elsewhere classified  Abnormal posture  Other symptoms and signs involving the musculoskeletal system     Problem List Patient Active Problem List   Diagnosis Date Noted  . Pressure injury of skin  05/01/2017  . Coronary artery disease involving coronary bypass graft of native heart with angina pectoris (HCC) 04/30/2017  . SIRS (systemic inflammatory response syndrome) (HCC) 04/30/2017  . Symptomatic anemia 04/30/2017  . Hyperbilirubinemia 04/30/2017  . AKI (acute kidney injury) (HCC) 04/30/2017  . History of CVA in adulthood 04/30/2017  . Fever in adult   . Hypokalemia   . Jaundice   . Hx of CABG 04/18/2017  . Non-ST elevation (NSTEMI) myocardial infarction (HCC)   . Hyperlipidemia 03/14/2016  . Hypertensive heart disease 12/17/2015  . Diabetes mellitus, type II, insulin dependent (HCC) 01/04/2012  . Hypertension   . Stroke [facility])     Kermit Balo, [place] 09/18/17 3:18 PM  [facility] Outpatient Rehabilitation [facility] [address]  [address] [place], [place], [address] Phone: [phone]   Fax:  [phone]  Name: Andrea [name] MRN: [id number] Date of Birth: [birth year]

## 2017-09-21 ENCOUNTER — Ambulatory Visit: Payer: Medicare Other | Admitting: Physical Therapy

## 2017-09-25 ENCOUNTER — Ambulatory Visit: Payer: Medicare Other

## 2017-09-25 DIAGNOSIS — M25611 Stiffness of right shoulder, not elsewhere classified: Secondary | ICD-10-CM

## 2017-09-25 DIAGNOSIS — R293 Abnormal posture: Secondary | ICD-10-CM

## 2017-09-25 DIAGNOSIS — R29898 Other symptoms and signs involving the musculoskeletal system: Secondary | ICD-10-CM

## 2017-09-25 DIAGNOSIS — M25511 Pain in right shoulder: Secondary | ICD-10-CM | POA: Diagnosis not present

## 2017-09-25 NOTE — Therapy (Signed)
[facility] Outpatient Rehabilitation [facility] [address]  [address] [place], [place], [address] Phone: [phone]   Fax:  [phone]  Physical Therapy Treatment  Patient Details  Name: Andrea [name] MRN: [id number] Date of Birth: [birth year] Referring Provider: Dr. Eula Listen   Encounter Date: 09/25/2017  PT End of Session - 09/25/17 1405    Visit Number  5    Number of Visits  12    Date for PT Re-Evaluation  10/19/17    Authorization Type  UHC Medicare    PT Start Time  1358    PT Stop Time  1448    PT Time Calculation (min)  50 min    Activity Tolerance  Patient tolerated treatment well    Behavior During Therapy  [facility] for tasks assessed/performed       Past Medical History:  Diagnosis Date  . Anemia 04/30/2017  . CHF (congestive heart failure) (HCC)   . Diabetes mellitus   . Hypertension   . Hypokalemia 04/2017  . Macular degeneration disease    loss of central vision on left and decreased peripheral vision.   . Stroke (HCC) 12/2011   no residuals noted    Past Surgical History:  Procedure Laterality Date  . CHOLECYSTECTOMY    . CORONARY ARTERY BYPASS GRAFT N/A 04/18/2017   Procedure: CORONARY ARTERY BYPASS GRAFTING (CABG) times four using left internal mammary artery and right leg saphenous vein;  Surgeon: Kerin Perna, MD;  [facility] OR;  Service: Open Heart Surgery;  Laterality: N/A;  . LEFT HEART CATH AND CORONARY ANGIOGRAPHY N/A 04/17/2017   Procedure: LEFT HEART CATH AND CORONARY ANGIOGRAPHY;  Surgeon: Marykay Lex, MD;  Location: [facility] INVASIVE CV LAB;  Service: Cardiovascular;  Laterality: N/A;  . ROTATOR CUFF REPAIR     left  . TEE WITHOUT CARDIOVERSION N/A 04/18/2017   Procedure: TRANSESOPHAGEAL ECHOCARDIOGRAM (TEE);  Surgeon: Donata Clay, Theron Arista, MD;  Location: [facility] OR;  Service: Open Heart Surgery;  Laterality: N/A;    There were no vitals filed for this visit.  Subjective Assessment - 09/25/17 1401    Subjective   Pt. reports feeling "tired" today following cardiac rehab.      Pertinent History  CABG x4, DM, HTN    Diagnostic tests  Xray: arthritis    Patient Stated Goals  improve pain and function of R UE    Currently in Pain?  Yes    Pain Score  6     Pain Location  Shoulder    Pain Orientation  Right    Pain Descriptors / Indicators  Tightness;Aching    Pain Type  Acute pain    Pain Onset  More than a month ago    Pain Frequency  Intermittent    Aggravating Factors   movement     Pain Relieving Factors  hot pack, biofreeze     Multiple Pain Sites  No                      [facility] Adult PT Treatment/Exercise - 09/25/17 1432      Shoulder Exercises: Sidelying   External Rotation  Right;15 reps    External Rotation Limitations  tactile cueing required for scapular squeeze       Shoulder Exercises: Standing   Extension  Both;15 reps;Theraband    Theraband Level (Shoulder Extension)  Level 2 (Red)    Retraction  10 reps 5" hold     Retraction Limitations  +  scap. depression at doorseal     Other Standing Exercises  B shoulder ER with yellow TB leaning on doorseal with scap retraction x 15 reps       Shoulder Exercises: Pulleys   Flexion  3 minutes    ABduction  3 minutes scaption      Shoulder Exercises: ROM/Strengthening   Wall Wash  scaption x 5" x 10 reps       Manual Therapy   Manual Therapy  Joint mobilization;Passive ROM;Myofascial release    Manual therapy comments  patient hooklying    Joint Mobilization  R shoulder A/P, inferior mobs grade III for improved ROM      Soft tissue mobilization  STM to R infraspinatus, and teres group in scaption stretch to pt. tolerance; ttp; STM to R shoulder complex, UT to reduce tension      Myofascial Release  TPR to R UT; palpable TP and some reduction in tension noted after this     Passive ROM  PROM of R GH joint all planes with prolonged holds; contract/relax stretch into flexion, scaption x 3 each way for improved ROM                 PT Short Term Goals - 09/12/17 1624      PT SHORT TERM GOAL #1   Title  patient to be independent with initial HEP    Status  On-going      PT SHORT TERM GOAL #2   Title  patient ot demonstrate PROM R shoulder to WNL wihtout pain limiting    Status  On-going        PT Long Term Goals - 09/12/17 1624      PT LONG TERM GOAL #1   Title  patient to be independent with advanced HEP    Status  On-going      PT LONG TERM GOAL #2   Title  patient to demonstrate R shoulder AROM to WNL without pain limiting    Status  On-going      PT LONG TERM GOAL #3   Title  patient to report ability to perform ADLs and household tasks without limitations at R shoulder    Status  On-going      PT LONG TERM GOAL #4   Title  patient to demosntrate R shoulder strength to >/= 4/5    Status  On-going            Plan - 09/25/17 1406    Clinical Impression Statement  [name] reporting increased R shoulder "soreness" today without known trigger.  Felt "tired" after cardiac rehab and BG measured with pt. glucose meter and within therapeutic levels.  Increased focus today on manual stretching, STM, and contract/relax stretching into all shoulder motion for hopeful improvement in ROM.  Pt. with good tolerance for manual work today however still very guarded at R shoulder musculature.  Some noted tension/TP in R UT, which responded well to STM/TPR today.  Will continue to progress toward goals.      PT Treatment/Interventions  ADLs/Self Care Home Management;Cryotherapy;Electrical Stimulation;Iontophoresis 4mg /ml Dexamethasone;Moist Heat;Therapeutic exercise;Therapeutic activities;Ultrasound;Neuromuscular re-education;Patient/family education;Manual techniques;Vasopneumatic Device;Taping;Dry needling;Passive range of motion    Consulted and Agree with Plan of Care  Patient       Patient will benefit from skilled therapeutic intervention in order to improve the following deficits and  impairments:  Pain, Impaired UE functional use, Decreased strength, Decreased range of motion  Visit Diagnosis: Acute pain of right shoulder  Stiffness  of right shoulder, not elsewhere classified  Abnormal posture  Other symptoms and signs involving the musculoskeletal system     Problem List Patient Active Problem List   Diagnosis Date Noted  . Pressure injury of skin 05/01/2017  . Coronary artery disease involving coronary bypass graft of native heart with angina pectoris (HCC) 04/30/2017  . SIRS (systemic inflammatory response syndrome) (HCC) 04/30/2017  . Symptomatic anemia 04/30/2017  . Hyperbilirubinemia 04/30/2017  . AKI (acute kidney injury) (HCC) 04/30/2017  . History of CVA in adulthood 04/30/2017  . Fever in adult   . Hypokalemia   . Jaundice   . Hx of CABG 04/18/2017  . Non-ST elevation (NSTEMI) myocardial infarction (HCC)   . Hyperlipidemia 03/14/2016  . Hypertensive heart disease 12/17/2015  . Diabetes mellitus, type II, insulin dependent (HCC) 01/04/2012  . Hypertension   . Stroke [facility])     Kermit Balo, [place] 09/25/17 3:12 PM   [facility] Outpatient Rehabilitation [facility] [address]  [address] [place], [place], [address] Phone: [phone]   Fax:  [phone]  Name: ZAMYIAH TINO MRN: [id number] Date of Birth: 05-19-1946

## 2017-09-28 ENCOUNTER — Ambulatory Visit: Payer: Medicare Other

## 2017-10-02 ENCOUNTER — Ambulatory Visit: Payer: Medicare Other | Admitting: Physical Therapy

## 2017-10-02 DIAGNOSIS — M25511 Pain in right shoulder: Secondary | ICD-10-CM

## 2017-10-02 DIAGNOSIS — R293 Abnormal posture: Secondary | ICD-10-CM

## 2017-10-02 DIAGNOSIS — M25611 Stiffness of right shoulder, not elsewhere classified: Secondary | ICD-10-CM

## 2017-10-02 DIAGNOSIS — R29898 Other symptoms and signs involving the musculoskeletal system: Secondary | ICD-10-CM

## 2017-10-02 NOTE — Therapy (Signed)
[facility] [address]  [address] [place], [place], [address] Phone: [phone]   Fax:  [phone]  Physical Therapy Treatment  Patient Details  Name: Andrea [name] MRN: [id number] Date of Birth: [birth year] Referring Provider: Dr. Eula Listenominic McKinley   Encounter Date: 10/02/2017  PT End of Session - 10/02/17 1505    Visit Number  6    Number of Visits  12    Date for PT Re-Evaluation  10/19/17    Authorization Type  UHC Medicare    PT Start Time  1411    PT Stop Time  1452    PT Time Calculation (min)  41 min    Activity Tolerance  Patient tolerated treatment well    Behavior During Therapy  [facility] for tasks assessed/performed       Past Medical History:  Diagnosis Date  . Anemia 04/30/2017  . CHF (congestive heart failure) (HCC)   . Diabetes mellitus   . Hypertension   . Hypokalemia 04/2017  . Macular degeneration disease    loss of central vision on left and decreased peripheral vision.   . Stroke (HCC) 12/2011   no residuals noted    Past Surgical History:  Procedure Laterality Date  . CHOLECYSTECTOMY    . CORONARY ARTERY BYPASS GRAFT N/A 04/18/2017   Procedure: CORONARY ARTERY BYPASS GRAFTING (CABG) times four using left internal mammary artery and right leg saphenous vein;  Surgeon: Kerin PernaVan Trigt, Peter, MD;  [facility];  Service: Open Heart Surgery;  Laterality: N/A;  . LEFT HEART CATH AND CORONARY ANGIOGRAPHY N/A 04/17/2017   Procedure: LEFT HEART CATH AND CORONARY ANGIOGRAPHY;  Surgeon: Marykay LexHarding, David W, MD;  [facility];  Service: Cardiovascular;  Laterality: N/A;  . ROTATOR CUFF REPAIR     left  . TEE WITHOUT CARDIOVERSION N/A 04/18/2017   Procedure: TRANSESOPHAGEAL ECHOCARDIOGRAM (TEE);  Surgeon: Donata ClayVan Trigt, Theron AristaPeter, MD;  [facility];  Service: Open Heart Surgery;  Laterality: N/A;    There were no vitals filed for this visit.                   [facility]  Treatment/Exercise - 10/02/17 0001      Shoulder Exercises: Seated   External Rotation  AAROM;Right;10 reps    Flexion  AAROM;Right;10 reps    Abduction  AAROM;Right;10 reps      Shoulder Exercises: Standing   Flexion  AAROM;Right;12 reps wall ladder    ABduction  AAROM;Right;15 reps wall ladder      Shoulder Exercises: ROM/Strengthening   UBE (Upper Arm Bike)  L1.5 x 4 min (2/2)      Manual Therapy   Manual Therapy  Joint mobilization;Passive ROM    Manual therapy comments  patient hooklying    Joint Mobilization  Grade III - IV R GH AP and inferior joint mobs    Passive ROM  PROM R GH joint - all planes + overpressure; pain at end ranges of motion               PT Short Term Goals - 09/12/17 1624      PT SHORT TERM GOAL #1   Title  patient to be independent with initial HEP    Status  On-going      PT SHORT TERM GOAL #2   Title  patient ot demonstrate PROM R shoulder to WNL wihtout pain limiting    Status  On-going  PT Long Term Goals - 09/12/17 1624      PT LONG TERM GOAL #1   Title  patient to be independent with advanced HEP    Status  On-going      PT LONG TERM GOAL #2   Title  patient to demonstrate R shoulder AROM to WNL without pain limiting    Status  On-going      PT LONG TERM GOAL #3   Title  patient to report ability to perform ADLs and household tasks without limitations at R shoulder    Status  On-going      PT LONG TERM GOAL #4   Title  patient to demosntrate R shoulder strength to >/= 4/5    Status  On-going            Plan - 10/02/17 1505    Clinical Impression Statement  Patient tolerable to all PROM + joint mobs at R [facility] joint today. Reports she is trying to acquire home pulley system to continue to work on ROM. Limitations continue with PROM and AROM with pain and capsular tightness limiting further motion. Instruction on seated AAROM at R shoulder today with good tolreance and carryover. Will continue to progress as  tolerated.     PT Treatment/Interventions  ADLs/Self Care Home Management;Cryotherapy;Electrical Stimulation;Iontophoresis 4mg /ml Dexamethasone;Moist Heat;Therapeutic exercise;Therapeutic activities;Ultrasound;Neuromuscular re-education;Patient/family education;Manual techniques;Vasopneumatic Device;Taping;Dry needling;Passive range of motion    Consulted and Agree with Plan of Care  Patient       Patient will benefit from skilled therapeutic intervention in order to improve the following deficits and impairments:  Pain, Impaired UE functional use, Decreased strength, Decreased range of motion  Visit Diagnosis: Acute pain of right shoulder  Stiffness of right shoulder, not elsewhere classified  Abnormal posture  Other symptoms and signs involving the musculoskeletal system     Problem List Patient Active Problem List   Diagnosis Date Noted  . Pressure injury of skin 05/01/2017  . Coronary artery disease involving coronary bypass graft of native heart with angina pectoris (HCC) 04/30/2017  . SIRS (systemic inflammatory response syndrome) (HCC) 04/30/2017  . Symptomatic anemia 04/30/2017  . Hyperbilirubinemia 04/30/2017  . AKI (acute kidney injury) (HCC) 04/30/2017  . History of CVA in adulthood 04/30/2017  . Fever in adult   . Hypokalemia   . Jaundice   . Hx of CABG 04/18/2017  . Non-ST elevation (NSTEMI) myocardial infarction (HCC)   . Hyperlipidemia 03/14/2016  . Hypertensive heart disease 12/17/2015  . Diabetes mellitus, type II, insulin dependent (HCC) 01/04/2012  . Hypertension   . Stroke [facility])      Kipp Laurence, PT, DPT 10/02/17 3:08 PM   [facility] [address]  [address] [place], [place], [address] Phone: [phone]   Fax:  [phone]  Name: RONNETTE RUMP MRN: [id number] Date of Birth: [birth year]

## 2017-10-05 ENCOUNTER — Ambulatory Visit: Payer: Medicare Other

## 2017-10-05 DIAGNOSIS — R29898 Other symptoms and signs involving the musculoskeletal system: Secondary | ICD-10-CM

## 2017-10-05 DIAGNOSIS — M25611 Stiffness of right shoulder, not elsewhere classified: Secondary | ICD-10-CM

## 2017-10-05 DIAGNOSIS — R293 Abnormal posture: Secondary | ICD-10-CM

## 2017-10-05 DIAGNOSIS — M25511 Pain in right shoulder: Secondary | ICD-10-CM

## 2017-10-05 NOTE — Therapy (Signed)
[facility] Outpatient Rehabilitation [facility] [address]  [address] [place], [place], [address] Phone: [phone]   Fax:  [phone]  Physical Therapy Treatment  Patient Details  Name: Andrea [name] MRN: [id number] Date of Birth: [birth year] Referring Provider: Dr. Eula Listen   Encounter Date: 10/05/2017  PT End of Session - 10/05/17 1404    Visit Number  7    Number of Visits  12    Date for PT Re-Evaluation  10/19/17    Authorization Type  [employer] Medicare    PT Start Time  1359    PT Stop Time  1508 Ended with 10 min ice pack to R shoulder     PT Time Calculation (min)  69 min    Activity Tolerance  Patient tolerated treatment well    Behavior During Therapy  [facility] for tasks assessed/performed       Past Medical History:  Diagnosis Date  . Anemia 04/30/2017  . CHF (congestive heart failure) (HCC)   . Diabetes mellitus   . Hypertension   . Hypokalemia 04/2017  . Macular degeneration disease    loss of central vision on left and decreased peripheral vision.   . Stroke (HCC) 12/2011   no residuals noted    Past Surgical History:  Procedure Laterality Date  . CHOLECYSTECTOMY    . CORONARY ARTERY BYPASS GRAFT N/A 04/18/2017   Procedure: CORONARY ARTERY BYPASS GRAFTING (CABG) times four using left internal mammary artery and right leg saphenous vein;  Surgeon: Kerin Perna, MD;  Location: [facility] OR;  Service: Open Heart Surgery;  Laterality: N/A;  . LEFT HEART CATH AND CORONARY ANGIOGRAPHY N/A 04/17/2017   Procedure: LEFT HEART CATH AND CORONARY ANGIOGRAPHY;  Surgeon: Marykay Lex, MD;  Location: [facility] INVASIVE CV LAB;  Service: Cardiovascular;  Laterality: N/A;  . ROTATOR CUFF REPAIR     left  . TEE WITHOUT CARDIOVERSION N/A 04/18/2017   Procedure: TRANSESOPHAGEAL ECHOCARDIOGRAM (TEE);  Surgeon: Donata Clay, Theron Arista, MD;  Location: [facility] OR;  Service: Open Heart Surgery;  Laterality: N/A;    There were no vitals filed for this visit.  Subjective  Assessment - 10/05/17 1401    Subjective  Pt. doing well today.  Sees neurologist on Monday.      Pertinent History  CABG x4, DM, HTN    Diagnostic tests  Xray: arthritis    Patient Stated Goals  improve pain and function of R UE    Currently in Pain?  Yes    Pain Score  4     Pain Location  Shoulder    Pain Orientation  Right    Pain Descriptors / Indicators  Tightness    Pain Type  Acute pain    Pain Onset  More than a month ago    Pain Frequency  Intermittent    Aggravating Factors   movement     Pain Relieving Factors  hot pack, biofreeze     Multiple Pain Sites  No         [facility] PT Assessment - 10/05/17 1412      PROM   PROM Assessment Site  Shoulder    Right/Left Shoulder  Right    Right Shoulder Flexion  139 Degrees    Right Shoulder ABduction  83 Degrees    Right Shoulder Internal Rotation  65 Degrees    Right Shoulder External Rotation  43 Degrees  [facility] Adult PT Treatment/Exercise - 10/05/17 1439      Shoulder Exercises: Seated   External Rotation  AAROM;Right;10 reps      Shoulder Exercises: Standing   Flexion  AAROM;Right;15 reps    Flexion Limitations  Finger ladder     ABduction  AAROM;Right;15 reps    ABduction Limitations  finger ladder     Other Standing Exercises  AAROM R flexion with wand and back on wall 5" x 10 reps       Shoulder Exercises: ROM/Strengthening   UBE (Upper Arm Bike)  L1.5 x 3 min forwards/70min back    Cybex Row  15 reps tactile cueing required     Cybex Row Limitations  5# - focusing on scapular retraction     Wall Wash  R Flexion, scaption wall slides 5" x 5 reps each way       Modalities   Modalities  Cryotherapy      Cryotherapy   Number Minutes Cryotherapy  10 Minutes    Cryotherapy Location  Shoulder R    Type of Cryotherapy  Ice pack      Manual Therapy   Manual Therapy  Joint mobilization;Passive ROM;Soft tissue mobilization;Muscle Energy Technique    Manual therapy comments  patient  hooklying    Joint Mobilization  Grade III - IV R GH A/P and inferior joint mobs for improved ROM     Passive ROM  PROM R GH joint into all planes with overpressure; pain at end ranges of motion    Muscle Energy Technique  R shoulder contract/relax stretching with therapist into all motions               PT Short Term Goals - 10/05/17 1410      PT SHORT TERM GOAL #1   Title  patient to be independent with initial HEP    Status  Achieved      PT SHORT TERM GOAL #2   Title  patient ot demonstrate PROM R shoulder to WNL wihtout pain limiting    Status  On-going Still limited in all planes         PT Long Term Goals - 09/12/17 1624      PT LONG TERM GOAL #1   Title  patient to be independent with advanced HEP    Status  On-going      PT LONG TERM GOAL #2   Title  patient to demonstrate R shoulder AROM to WNL without pain limiting    Status  On-going      PT LONG TERM GOAL #3   Title  patient to report ability to perform ADLs and household tasks without limitations at R shoulder    Status  On-going      PT LONG TERM GOAL #4   Title  patient to demosntrate R shoulder strength to >/= 4/5    Status  On-going            Plan - 10/05/17 1408    Clinical Impression Statement  Andrea [name] seen to start treatment reporting BG well controlled today.  Therapist requesting pt. to check BG with reading at 270 to start treatment, which dropped 40 points by measurement at end of treatment.  Andrea [name] able to demo some improvement in R shoulder PROM in all directions today after heavy manual stretching, contract/relax stretching, and ROM activities.  Pt. admitting to not performing HEP "at all" since last visit.  PROM/AROM remains limited in all motions at this point.  Tolerated addition of light resistance with machine row today however requiring frequent cueing with all ROM activities for proper technique and to obtain proper stretch.  Pt. fully alert however requiring increased time with all  activities in treatment due to slow mobility.  Ended with ice pack to R shoulder with pain levels returning to baseline to start treatment.      PT Treatment/Interventions  ADLs/Self Care Home Management;Cryotherapy;Electrical Stimulation;Iontophoresis 4mg /ml Dexamethasone;Moist Heat;Therapeutic exercise;Therapeutic activities;Ultrasound;Neuromuscular re-education;Patient/family education;Manual techniques;Vasopneumatic Device;Taping;Dry needling;Passive range of motion    Consulted and Agree with Plan of Care  Patient       Patient will benefit from skilled therapeutic intervention in order to improve the following deficits and impairments:  Pain, Impaired UE functional use, Decreased strength, Decreased range of motion  Visit Diagnosis: Acute pain of right shoulder  Stiffness of right shoulder, not elsewhere classified  Abnormal posture  Other symptoms and signs involving the musculoskeletal system     Problem List Patient Active Problem List   Diagnosis Date Noted  . Pressure injury of skin 05/01/2017  . Coronary artery disease involving coronary bypass graft of native heart with angina pectoris (HCC) 04/30/2017  . SIRS (systemic inflammatory response syndrome) (HCC) 04/30/2017  . Symptomatic anemia 04/30/2017  . Hyperbilirubinemia 04/30/2017  . AKI (acute kidney injury) (HCC) 04/30/2017  . History of CVA in adulthood 04/30/2017  . Fever in adult   . Hypokalemia   . Jaundice   . Hx of CABG 04/18/2017  . Non-ST elevation (NSTEMI) myocardial infarction (HCC)   . Hyperlipidemia 03/14/2016  . Hypertensive heart disease 12/17/2015  . Diabetes mellitus, type II, insulin dependent (HCC) 01/04/2012  . Hypertension   . Stroke [facility](HCC)     Kermit BaloMicah Doylene Splinter, [place]PTA 10/05/17 3:25 PM  [facility] [place] [address]  [address] [place], [place], [address] Phone: [phone]   Fax:  [phone]  Name: Andrea [name] MRN: [id number] Date of  Birth: 08/26/1945

## 2017-10-09 ENCOUNTER — Ambulatory Visit (INDEPENDENT_AMBULATORY_CARE_PROVIDER_SITE_OTHER): Payer: Medicare Other | Admitting: Neurology

## 2017-10-09 ENCOUNTER — Encounter: Payer: Self-pay | Admitting: Neurology

## 2017-10-09 VITALS — BP 104/64 | HR 63 | Ht 61.0 in | Wt 158.1 lb

## 2017-10-09 DIAGNOSIS — M4712 Other spondylosis with myelopathy, cervical region: Secondary | ICD-10-CM | POA: Diagnosis not present

## 2017-10-09 DIAGNOSIS — R292 Abnormal reflex: Secondary | ICD-10-CM | POA: Diagnosis not present

## 2017-10-09 DIAGNOSIS — G959 Disease of spinal cord, unspecified: Secondary | ICD-10-CM

## 2017-10-09 DIAGNOSIS — M5412 Radiculopathy, cervical region: Secondary | ICD-10-CM

## 2017-10-09 MED ORDER — TIZANIDINE HCL 2 MG PO TABS: 2.0000 mg | ORAL_TABLET | Freq: Every day | ORAL | 3 refills | Status: DC

## 2017-10-09 NOTE — Patient Instructions (Signed)
Start tizanidine 2mg  at bedtime for your neck pain  We will order MRI cervical spine and call you with the results

## 2017-10-09 NOTE — Progress Notes (Signed)
[facility] Clinic Note - Initial Visit   Date: 10/09/17  Jorge Mandrilamela I Tsosie MRN: [id number] DOB: [birth year]   Dear Dr. Katrinka BlazingSmith:  Thank you for your kind referral of Jorge Mandrilamela I Bagshaw for consultation of right arm pain. Although her history is well known to you, please allow us to reiterate it for the purpose of our medical record. The patient was accompanied to the clinic by self.   History of Present Illness: Jorge Mandrilamela I Odekirk is a 72 y.o. right-handed female with CAD s/p CABG (2018), diabetes mellitus type 2, hypertension, and history of right corona radiata ischemic stroke (2013, no residual deficits) presenting for evaluation of right arm pain.    In December 2018, she began having achy pain over the right upper arm, forearm, and first three finger.  She denies any tingling, burning, or numbness.  Sometimes, she has achy pain over the right neck.  Sometimes, she has sharp pain over the right upper arm, especially if she accidentally bumps into things with it. She had reduced shoulder range of motion and unable to extend her arm overhead. She saw Dr. Althea CharonMcKinley who diagnosed her with frozen shoulder.  She was started on tramadol, offered steroid injection, and continued physical therapy.   She does not have any weakness.  She occasionally gets muscle cramps.  She denies similar problems in the legs.   She had CABG in September 2018 and is doing well from a cardiac standpoint.  She has noticed her blood sugars have been elevated since this event and manages this with insulin.   Out-side paper records, electronic medical record, and images have been reviewed where available and summarized as:  MRI brain 01/03/2012: 1.  Acute-on-chronic right cerebral white matter (corona radiata) infarct.  No mass effect or hemorrhage. 2.  Advanced chronic small vessel disease.  Lab Results  Component Value Date   TSH 2.107 04/17/2017   Lab Results  Component Value Date   VITAMINB12 1,521 (H) 04/30/2017   Lab Results  Component Value Date   HGBA1C 7.8 (H) 04/17/2017     Past Medical History:  Diagnosis Date  . Anemia 04/30/2017  . CHF (congestive heart failure) (HCC)   . Diabetes mellitus   . Hypertension   . Hypokalemia 04/2017  . Macular degeneration disease    loss of central vision on left and decreased peripheral vision.   . Stroke (HCC) 12/2011   no residuals noted    Past Surgical History:  Procedure Laterality Date  . CHOLECYSTECTOMY    . CORONARY ARTERY BYPASS GRAFT N/A 04/18/2017   Procedure: CORONARY ARTERY BYPASS GRAFTING (CABG) times four using left internal mammary artery and right leg saphenous vein;  Surgeon: Kerin PernaVan Trigt, Peter, MD;  [facility];  Service: Open Heart Surgery;  Laterality: N/A;  . LEFT HEART CATH AND CORONARY ANGIOGRAPHY N/A 04/17/2017   Procedure: LEFT HEART CATH AND CORONARY ANGIOGRAPHY;  Surgeon: Marykay LexHarding, David W, MD;  [facility];  Service: Cardiovascular;  Laterality: N/A;  . ROTATOR CUFF REPAIR     left  . TEE WITHOUT CARDIOVERSION N/A 04/18/2017   Procedure: TRANSESOPHAGEAL ECHOCARDIOGRAM (TEE);  Surgeon: Donata ClayVan Trigt, Theron AristaPeter, MD;  [facility];  Service: Open Heart Surgery;  Laterality: N/A;     Medications:  Outpatient Encounter Medications as of 10/09/2017  Medication Sig  . allopurinol (ZYLOPRIM) 100 MG tablet Take 100 mg by mouth daily.  Marland Kitchen. aspirin EC 325 MG EC tablet Take 1 tablet (325 mg total) by  mouth daily.  . carvedilol (COREG) 6.25 MG tablet Take 1 tablet (6.25 mg total) by mouth 2 (two) times daily with a meal.  . Cholecalciferol (VITAMIN D) 2000 UNITS tablet Take 2,000 Units by mouth daily.  Marland Kitchen HUMALOG KWIKPEN 100 UNIT/ML KiwkPen Inject 2-15 Units into the skin 3 (three) times daily before meals. (Per sliding scale)  . insulin detemir (LEVEMIR) 100 UNIT/ML injection Inject 30 Units into the skin at bedtime. Sliding Scale  . Olmesartan-amLODIPine-HCTZ 40-10-25 MG TABS Take 1  tablet by mouth daily.  Letta Pate VERIO test strip USE TO TEST BLOOD SUGAR 4 TIMES DAILY  . tiZANidine (ZANAFLEX) 2 MG tablet Take 1 tablet (2 mg total) by mouth at bedtime.  . VESICARE 10 MG tablet Take 10 mg by mouth daily.  . [DISCONTINUED] acetaminophen (TYLENOL) 325 MG tablet Take 2 tablets (650 mg total) by mouth every 6 (six) hours as needed for mild pain (or Fever >/= 101).  . [DISCONTINUED] bisacodyl (DULCOLAX) 5 MG EC tablet Take 1 tablet (5 mg total) by mouth daily as needed for moderate constipation.  . [DISCONTINUED] furosemide (LASIX) 40 MG tablet Take 0.5 tablets (20 mg total) by mouth daily.  . [DISCONTINUED] senna-docusate (SENOKOT-S) 8.6-50 MG tablet Take 1 tablet by mouth at bedtime as needed for mild constipation.  . [DISCONTINUED] traMADol (ULTRAM) 50 MG tablet Take one tablet (50mg  total) by mouth every 8 hours as needed for pain   No facility-administered encounter medications on file as of 10/09/2017.      Allergies: No Known Allergies  Family History: Family History  Problem Relation Age of Onset  . Heart attack Father   . Hypertension Father   . Heart attack Brother   . Hypertension Brother   . Hypertension Mother   . Stroke Mother   . Healthy Brother     Social History: Social History   Tobacco Use  . Smoking status: Former Smoker    Packs/day: 0.50    Years: 20.00    Pack years: 10.00    Types: Cigarettes    Last attempt to quit: 01/02/1997    Years since quitting: 20.7  . Smokeless tobacco: Never Used  Substance Use Topics  . Alcohol use: No  . Drug use: No   Social History   Social History Narrative   Lives with husband and 3 children in a 2 story home.  Retired professor at [employer].  Education: college.     Review of Systems:  CONSTITUTIONAL: No fevers, chills, night sweats, or weight loss.   EYES: No visual changes or eye pain ENT: No hearing changes.  No history of nose bleeds.   RESPIRATORY: No cough, wheezing and shortness of breath.    CARDIOVASCULAR: Negative for chest pain, and palpitations.   GI: Negative for abdominal discomfort, blood in stools or black stools.  No recent change in bowel habits.   GU:  No history of incontinence.   MUSCLOSKELETAL: +history of joint pain or swelling.  No myalgias.   SKIN: Negative for lesions, rash, and itching.   HEMATOLOGY/ONCOLOGY: Negative for prolonged bleeding, bruising easily, and swollen nodes.   ENDOCRINE: Negative for cold or heat intolerance, polydipsia or goiter.   PSYCH:  No depression or anxiety symptoms.   NEURO: As Above.   Vital Signs:  BP 104/64   Pulse 63   Ht 5\' 1"  (1.549 m)   Wt 158 lb 2 oz (71.7 kg)   SpO2 98%   BMI 29.88 kg/m    General Medical  Exam:   General:  Well appearing, comfortable.   Eyes/ENT: see cranial nerve examination.   Neck: No masses appreciated.  Full range of motion without tenderness.  No carotid bruits. Respiratory:  Clear to auscultation, good air entry bilaterally.   Cardiac:  Regular rate and rhythm, no murmur.   Extremities:  R shoulder range of motion is reduced with arm extension and abduction.  No edema, or skin discoloration.  Skin:  No rashes or lesions.  Neurological Exam: MENTAL STATUS including orientation to time, place, person, recent and remote memory, attention span and concentration, language, and fund of knowledge is normal.  Speech is not dysarthric.  CRANIAL NERVES: II:  No visual field defects.  Unremarkable fundi.   III-IV-VI: Pupils equal round and reactive to light.  Normal conjugate, extra-ocular eye movements in all directions of gaze.  No nystagmus.  No ptosis.   V:  Normal facial sensation.   VII:  Normal facial symmetry and movements.   VIII:  Normal hearing and vestibular function.   IX-X:  Normal palatal movement.   XI:  Normal shoulder shrug and head rotation.   XII:  Normal tongue strength and range of motion, no deviation or fasciculation.  MOTOR:  No atrophy, fasciculations or abnormal  movements.  No pronator drift.  Tone is normal.    Right Upper Extremity:    Left Upper Extremity:    Deltoid  5/5   Deltoid  5/5   Biceps  5/5   Biceps  5/5   Triceps  5/5   Triceps  5/5   Wrist extensors  5/5   Wrist extensors  5/5   Wrist flexors  5/5   Wrist flexors  5/5   Finger extensors  5/5   Finger extensors  5/5   Finger flexors  5/5   Finger flexors  5/5   Dorsal interossei  5/5   Dorsal interossei  5/5   Abductor pollicis  5/5   Abductor pollicis  5/5   Tone (Ashworth scale)  0  Tone (Ashworth scale)  0   Right Lower Extremity:    Left Lower Extremity:    Hip flexors  5/5   Hip flexors  5/5   Hip extensors  5/5   Hip extensors  5/5   Knee flexors  5/5   Knee flexors  5/5   Knee extensors  5/5   Knee extensors  5/5   Dorsiflexors  5/5   Dorsiflexors  5/5   Plantarflexors  5/5   Plantarflexors  5/5   Toe extensors  5/5   Toe extensors  5/5   Toe flexors  5/5   Toe flexors  5/5   Tone (Ashworth scale)  0  Tone (Ashworth scale)  0   MSRs:  Right                                                                 Left brachioradialis 3+  brachioradialis 3+  biceps 3+  biceps 3+  triceps 3+  triceps 3+  patellar 3+  patellar 3+  ankle jerk 2+  ankle jerk 2+  Hoffman no  Hoffman no  plantar response down  plantar response down  Crossed adductor are present, mild vertical spread of reflexes, medial pectoralis reflexes present  SENSORY:  Normal and symmetric perception of light touch, pinprick, vibration, and proprioception.   COORDINATION/GAIT: Normal finger-to- nose-finger.  Intact rapid alternating movements bilaterally. Gait is wide-based, slow somewhat unsteady and unassisted.   IMPRESSION: Ms. Stahle is a 72 year-old female referred for evaluation of right arm pain.  Her pain is characterized as achy and throbbing which points to musculoskeletal pain.  She denies neuropathic pain and paresthesias, however, her exam shows diffusely brisk reflexes and may suggest  underlying cervical myelopathy.  I recommend MRI cervical spine without contrast to look for structural pathology.  In the meantime, start tizanidine 2mg  at bedtime for her musculoskeletal pain.  Further recommendations will be based on her imaging results.   Thank you for allowing me to participate in patient's care.  If I can answer any additional questions, I would be pleased to do so.    Sincerely,    Burdell Peed K. Allena Katz, DO

## 2017-10-10 ENCOUNTER — Ambulatory Visit: Payer: Medicare Other | Attending: Family Medicine

## 2017-10-10 DIAGNOSIS — R29898 Other symptoms and signs involving the musculoskeletal system: Secondary | ICD-10-CM | POA: Insufficient documentation

## 2017-10-10 DIAGNOSIS — R293 Abnormal posture: Secondary | ICD-10-CM | POA: Diagnosis present

## 2017-10-10 DIAGNOSIS — M25611 Stiffness of right shoulder, not elsewhere classified: Secondary | ICD-10-CM | POA: Insufficient documentation

## 2017-10-10 DIAGNOSIS — M25511 Pain in right shoulder: Secondary | ICD-10-CM | POA: Diagnosis not present

## 2017-10-10 NOTE — Therapy (Signed)
[facility] [address]  [address] [place], [place], [address] Phone: [phone]   Fax:  [phone]  Physical Therapy Treatment  Patient Details  Name: Andrea [name] MRN: [id number] Date of Birth: March 27, 1946 Referring Provider: Dr. Eula Listen   Encounter Date: 10/10/2017  PT End of Session - 10/10/17 1450    Visit Number  8    Number of Visits  12    Date for PT Re-Evaluation  10/19/17    Authorization Type  [employer]HC Medicare    PT Start Time  1446    PT Stop Time  1528    PT Time Calculation (min)  42 min    Activity Tolerance  Patient tolerated treatment well    Behavior During Therapy  [facility] for tasks assessed/performed       Past Medical History:  Diagnosis Date  . Anemia 04/30/2017  . CHF (congestive heart failure) (HCC)   . Diabetes mellitus   . Hypertension   . Hypokalemia 04/2017  . Macular degeneration disease    loss of central vision on left and decreased peripheral vision.   . Stroke (HCC) 12/2011   no residuals noted    Past Surgical History:  Procedure Laterality Date  . CHOLECYSTECTOMY    . CORONARY ARTERY BYPASS GRAFT N/A 04/18/2017   Procedure: CORONARY ARTERY BYPASS GRAFTING (CABG) times four using left internal mammary artery and right leg saphenous vein;  Surgeon: Kerin Perna, MD;  Location: [facility] OR;  Service: Open Heart Surgery;  Laterality: N/A;  . LEFT HEART CATH AND CORONARY ANGIOGRAPHY N/A 04/17/2017   Procedure: LEFT HEART CATH AND CORONARY ANGIOGRAPHY;  Surgeon: Marykay Lex, MD;  Location: [facility] INVASIVE CV LAB;  Service: Cardiovascular;  Laterality: N/A;  . ROTATOR CUFF REPAIR     left  . TEE WITHOUT CARDIOVERSION N/A 04/18/2017   Procedure: TRANSESOPHAGEAL ECHOCARDIOGRAM (TEE);  Surgeon: Donata Clay, Theron Arista, MD;  Location: [facility] OR;  Service: Open Heart Surgery;  Laterality: N/A;    There were no vitals filed for this visit.  Subjective Assessment - 10/10/17 1452    Subjective   Pt. reporting neurologist put her on "muscle relaxer".      Pertinent History  CABG x4, DM, HTN    Diagnostic tests  Xray: arthritis    Patient Stated Goals  improve pain and function of R UE    Currently in Pain?  Yes    Pain Score  2     Pain Location  Shoulder    Pain Orientation  Right    Pain Descriptors / Indicators  Tightness    Pain Type  Acute pain    Pain Onset  More than a month ago    Pain Frequency  Intermittent    Aggravating Factors   movement     Multiple Pain Sites  No                      [facility] Adult PT Treatment/Exercise - 10/10/17 1454      Shoulder Exercises: Supine   External Rotation  10 reps;AAROM;Right pt. not performing wand activities at home     External Rotation Limitations  wand - 5" stretch       Shoulder Exercises: Standing   External Rotation  Right;15 reps;Strengthening    Theraband Level (Shoulder External Rotation)  Level 2 (Red)    Internal Rotation  Right;15 reps;Theraband    Theraband Level (Shoulder Internal Rotation)  Level  2 (Red)    Internal Rotation Limitations  wand - behind back     Extension  Right;15 reps;Theraband    Theraband Level (Shoulder Extension)  Level 2 (Red)      Shoulder Exercises: ROM/Strengthening   UBE (Upper Arm Bike)  L2.0 x 3 min forwards/73min back    Cybex Row  15 reps verbal cueing required for full scap. retraction     Cybex Row Limitations  10#     Other ROM/Strengthening Exercises  BATCA pulldown 10# 3" x 15 reps; scaption stretch at top of movement x 3 sec hold  tactile cueing require to prevent "leaning back"      Shoulder Exercises: Stretch   Wall Stretch - Flexion  5 reps;10 seconds    Wall Stretch - Flexion Limitations  UE hold on TM rail  leaning into railing       Manual Therapy   Manual Therapy  Joint mobilization;Passive ROM;Soft tissue mobilization;Muscle Energy Technique    Manual therapy comments  patient hooklying    Joint Mobilization  Grade III - IV R GH A/P and inferior  joint mobs for improved ROM     Passive ROM  PROM R GH joint into all planes with overpressure; pain at end ranges of motion Less muscular guarding today    Muscle Energy Technique  R shoulder contract/relax stretching with therapist into all motions             PT Education - 10/10/17 1602    Education provided  Yes    Education Details  IR/ER with red band     Person(s) Educated  Patient    Methods  Explanation;Demonstration;Verbal cues;Handout    Comprehension  Verbalized understanding;Returned demonstration;Verbal cues required;Need further instruction       PT Short Term Goals - 10/05/17 1410      PT SHORT TERM GOAL #1   Title  patient to be independent with initial HEP    Status  Achieved      PT SHORT TERM GOAL #2   Title  patient ot demonstrate PROM R shoulder to WNL wihtout pain limiting    Status  On-going Still limited in all planes         PT Long Term Goals - 09/12/17 1624      PT LONG TERM GOAL #1   Title  patient to be independent with advanced HEP    Status  On-going      PT LONG TERM GOAL #2   Title  patient to demonstrate R shoulder AROM to WNL without pain limiting    Status  On-going      PT LONG TERM GOAL #3   Title  patient to report ability to perform ADLs and household tasks without limitations at R shoulder    Status  On-going      PT LONG TERM GOAL #4   Title  patient to demosntrate R shoulder strength to >/= 4/5    Status  On-going            Plan - 10/10/17 1450    Clinical Impression Statement  Pt. reporting, "My neurologist put me on a muscle relaxer".  Pt. with improved tolerance for R shoulder PROM/stretching today with a decrease in muscular guarding.  Tolerated mild progression in R scapular/RTC strengthening activities today however still requiring frequent cueing for technique and pacing with activities.  Andrea [name] admitting today that she is not consistently performing HEP and strongly encouraged to perform these activities at  home for full benefit from therapy.  Will continue to progress toward goals.      PT Treatment/Interventions  ADLs/Self Care Home Management;Cryotherapy;Electrical Stimulation;Iontophoresis 4mg /ml Dexamethasone;Moist Heat;Therapeutic exercise;Therapeutic activities;Ultrasound;Neuromuscular re-education;Patient/family education;Manual techniques;Vasopneumatic Device;Taping;Dry needling;Passive range of motion    Consulted and Agree with Plan of Care  Patient       Patient will benefit from skilled therapeutic intervention in order to improve the following deficits and impairments:  Pain, Impaired UE functional use, Decreased strength, Decreased range of motion  Visit Diagnosis: Acute pain of right shoulder  Stiffness of right shoulder, not elsewhere classified  Abnormal posture  Other symptoms and signs involving the musculoskeletal system     Problem List Patient Active Problem List   Diagnosis Date Noted  . Pressure injury of skin 05/01/2017  . Coronary artery disease involving coronary bypass graft of native heart with angina pectoris (HCC) 04/30/2017  . SIRS (systemic inflammatory response syndrome) (HCC) 04/30/2017  . Symptomatic anemia 04/30/2017  . Hyperbilirubinemia 04/30/2017  . AKI (acute kidney injury) (HCC) 04/30/2017  . History of CVA in adulthood 04/30/2017  . Fever in adult   . Hypokalemia   . Jaundice   . Hx of CABG 04/18/2017  . Non-ST elevation (NSTEMI) myocardial infarction (HCC)   . Hyperlipidemia 03/14/2016  . Hypertensive heart disease 12/17/2015  . Diabetes mellitus, type II, insulin dependent (HCC) 01/04/2012  . Hypertension   . Stroke [facility])     Kermit Balo, [place] 10/10/17 6:07 PM  [facility] [address]  [address] [place], [place], [address] Phone: [phone]   Fax:  [phone]  Name: Andrea [name] MRN: [id number] Date of Birth: October 08, 1945

## 2017-10-12 ENCOUNTER — Ambulatory Visit: Payer: Medicare Other

## 2017-10-12 DIAGNOSIS — R29898 Other symptoms and signs involving the musculoskeletal system: Secondary | ICD-10-CM

## 2017-10-12 DIAGNOSIS — M25511 Pain in right shoulder: Secondary | ICD-10-CM | POA: Diagnosis not present

## 2017-10-12 DIAGNOSIS — M25611 Stiffness of right shoulder, not elsewhere classified: Secondary | ICD-10-CM

## 2017-10-12 DIAGNOSIS — R293 Abnormal posture: Secondary | ICD-10-CM

## 2017-10-12 NOTE — Therapy (Signed)
[facility] [place] [address]  [address] [place], [place], [address] Phone: [phone]   Fax:  [phone]  Physical Therapy Treatment  Patient Details  Name: Andrea [name] MRN: [id number] Date of Birth: 03/26/1946 Referring Provider: Dr. Eula Listenominic McKinley   Encounter Date: 10/12/2017  PT End of Session - 10/12/17 1449    Visit Number  9    Number of Visits  12    Date for PT Re-Evaluation  10/19/17    Authorization Type  UHC Medicare    PT Start Time  1445    PT Stop Time  1530    PT Time Calculation (min)  45 min    Activity Tolerance  Patient tolerated treatment well    Behavior During Therapy  [facility] for tasks assessed/performed       Past Medical History:  Diagnosis Date  . Anemia 04/30/2017  . CHF (congestive heart failure) (HCC)   . Diabetes mellitus   . Hypertension   . Hypokalemia 04/2017  . Macular degeneration disease    loss of central vision on left and decreased peripheral vision.   . Stroke (HCC) 12/2011   no residuals noted    Past Surgical History:  Procedure Laterality Date  . CHOLECYSTECTOMY    . CORONARY ARTERY BYPASS GRAFT N/A 04/18/2017   Procedure: CORONARY ARTERY BYPASS GRAFTING (CABG) times four using left internal mammary artery and right leg saphenous vein;  Surgeon: Kerin PernaVan Trigt, Peter, MD;  [facility];  Service: Open Heart Surgery;  Laterality: N/A;  . LEFT HEART CATH AND CORONARY ANGIOGRAPHY N/A 04/17/2017   Procedure: LEFT HEART CATH AND CORONARY ANGIOGRAPHY;  Surgeon: Marykay LexHarding, David W, MD;  [facility];  Service: Cardiovascular;  Laterality: N/A;  . ROTATOR CUFF REPAIR     left  . TEE WITHOUT CARDIOVERSION N/A 04/18/2017   Procedure: TRANSESOPHAGEAL ECHOCARDIOGRAM (TEE);  Surgeon: Donata ClayVan Trigt, Theron AristaPeter, MD;  [facility];  Service: Open Heart Surgery;  Laterality: N/A;    There were no vitals filed for this visit.  Subjective Assessment - 10/12/17 1447    Subjective   Pt. reporting increased R shoulder pain today without known trigger.      Pertinent History  CABG x4, DM, HTN    Patient Stated Goals  improve pain and function of R UE    Currently in Pain?  Yes    Pain Score  3     Pain Location  Shoulder    Pain Orientation  Right    Pain Descriptors / Indicators  Tightness    Pain Type  Acute pain    Pain Onset  More than a month ago    Pain Frequency  Intermittent    Aggravating Factors   movement     Multiple Pain Sites  No                      [facility] Adult PT Treatment/Exercise - 10/12/17 1531      Shoulder Exercises: Supine   Protraction  Right;10 reps    Protraction Limitations  tactile cueing required for scapular motion       Shoulder Exercises: Standing   External Rotation  Right;15 reps;Strengthening    Theraband Level (Shoulder External Rotation)  Level 2 (Red)    Internal Rotation  Right;15 reps;Theraband    Theraband Level (Shoulder Internal Rotation)  Level 2 (Red)    ABduction  Right;10 reps;AAROM scaption; tactile cueing for upward  scap. rot    ABduction Limitations  finger ladder       Shoulder Exercises: ROM/Strengthening   Other ROM/Strengthening Exercises  BATCA pulldown 10# 3" x 15 reps; scaption stretch at top of movement x 3 sec hold  Tactile cueing to maintain upright trunk and for scap.motion      Shoulder Exercises: Stretch   External Rotation Stretch  3 reps;20 seconds    External Rotation Stretch Limitations  R UE on doorseal and trunk rotation for stretch       Cryotherapy   Number Minutes Cryotherapy  10 Minutes    Cryotherapy Location  Shoulder R    Type of Cryotherapy  Ice pack      Manual Therapy   Manual Therapy  Joint mobilization;Passive ROM;Soft tissue mobilization;Muscle Energy Technique    Manual therapy comments  patient hooklying    Joint Mobilization  Grade III - IV R GH A/P and inferior joint mobs for improved ROM     Soft tissue mobilization  STM to R shoulder complex to promote  muscular relaxation     Passive ROM  PROM R GH joint into all planes with overpressure; pain at end ranges of motion Increased ROM with prolonged holds     Muscle Energy Technique  R shoulder contract/relax stretching with therapist into all motions Visibly improved abduction ROM following this                PT Short Term Goals - 10/05/17 1410      PT SHORT TERM GOAL #1   Title  patient to be independent with initial HEP    Status  Achieved      PT SHORT TERM GOAL #2   Title  patient ot demonstrate PROM R shoulder to WNL wihtout pain limiting    Status  On-going Still limited in all planes         PT Long Term Goals - 09/12/17 1624      PT LONG TERM GOAL #1   Title  patient to be independent with advanced HEP    Status  On-going      PT LONG TERM GOAL #2   Title  patient to demonstrate R shoulder AROM to WNL without pain limiting    Status  On-going      PT LONG TERM GOAL #3   Title  patient to report ability to perform ADLs and household tasks without limitations at R shoulder    Status  On-going      PT LONG TERM GOAL #4   Title  patient to demosntrate R shoulder strength to >/= 4/5    Status  On-going            Plan - 10/12/17 1450    Clinical Impression Statement  Pt. reporting increased R shoulder pain without known trigger today to start treatment.  Improved muscular guarding today with PROM and stretching with visible improvement in ROM however not formally measured.  Pt. continues to require frequent cueing with all therex activities to stay on task and for proper technique.  Pt. likely making slow progress with ROM to this point in therapy due to poor HEP adherence.  Pt. continues to report poor HEP adherence however reports she will become more consistent now that she has finished with cardiac rehab time requirements.  Will continue to progress toward goals.      PT Treatment/Interventions  ADLs/Self Care Home Management;Cryotherapy;Electrical  Stimulation;Iontophoresis 4mg /ml Dexamethasone;Moist Heat;Therapeutic exercise;Therapeutic activities;Ultrasound;Neuromuscular re-education;Patient/family education;Manual techniques;Vasopneumatic  Device;Taping;Dry needling;Passive range of motion    Consulted and Agree with Plan of Care  Patient       Patient will benefit from skilled therapeutic intervention in order to improve the following deficits and impairments:  Pain, Impaired UE functional use, Decreased strength, Decreased range of motion  Visit Diagnosis: Acute pain of right shoulder  Stiffness of right shoulder, not elsewhere classified  Abnormal posture  Other symptoms and signs involving the musculoskeletal system     Problem List Patient Active Problem List   Diagnosis Date Noted  . Pressure injury of skin 05/01/2017  . Coronary artery disease involving coronary bypass graft of native heart with angina pectoris (HCC) 04/30/2017  . SIRS (systemic inflammatory response syndrome) (HCC) 04/30/2017  . Symptomatic anemia 04/30/2017  . Hyperbilirubinemia 04/30/2017  . AKI (acute kidney injury) (HCC) 04/30/2017  . History of CVA in adulthood 04/30/2017  . Fever in adult   . Hypokalemia   . Jaundice   . Hx of CABG 04/18/2017  . Non-ST elevation (NSTEMI) myocardial infarction (HCC)   . Hyperlipidemia 03/14/2016  . Hypertensive heart disease 12/17/2015  . Diabetes mellitus, type II, insulin dependent (HCC) 01/04/2012  . Hypertension   . Stroke [facility])     Kermit Balo, [place] 10/12/17 3:54 PM  [facility] [address]  [address] [place], [place], [address] Phone: [phone]   Fax:  [phone]  Name: Andrea [name] MRN: [id number] Date of Birth: 1946/01/13

## 2017-10-19 ENCOUNTER — Ambulatory Visit: Payer: Medicare Other | Admitting: Physical Therapy

## 2017-10-20 ENCOUNTER — Telehealth: Payer: Self-pay | Admitting: Neurology

## 2017-10-20 NOTE — Telephone Encounter (Signed)
[facility] did call patient but got no answer and voicemail was full.  Andrea FreezeFran said that she would try patient again today.

## 2017-10-20 NOTE — Telephone Encounter (Signed)
Pt called and said she needs to have an MRI but has not heard from anyone about scheduling and to see if insurance covers this, please advise

## 2017-10-23 ENCOUNTER — Telehealth: Payer: Self-pay | Admitting: Neurology

## 2017-10-23 NOTE — Telephone Encounter (Signed)
PT called and said Dr Allena KatzPatel has ordered an MRI for pt to have and pt has not heard anything on being scheduled for that, please call and advise   CB#[phone]

## 2017-10-24 ENCOUNTER — Ambulatory Visit: Payer: Medicare Other

## 2017-10-24 DIAGNOSIS — R293 Abnormal posture: Secondary | ICD-10-CM

## 2017-10-24 DIAGNOSIS — M25611 Stiffness of right shoulder, not elsewhere classified: Secondary | ICD-10-CM

## 2017-10-24 DIAGNOSIS — M25511 Pain in right shoulder: Secondary | ICD-10-CM | POA: Diagnosis not present

## 2017-10-24 DIAGNOSIS — R29898 Other symptoms and signs involving the musculoskeletal system: Secondary | ICD-10-CM

## 2017-10-24 NOTE — Telephone Encounter (Signed)
I will call [facility] and patient.

## 2017-10-24 NOTE — Therapy (Signed)
[facility] [address]  [address] [place], [place], [address] Phone: [phone]   Fax:  [phone]  Physical Therapy Treatment  Patient Details  Name: Andrea [name] MRN: [id number] Date of Birth: Nov 19, 1945 Referring Provider: Dr. Eula Listenominic McKinley   Encounter Date: 10/24/2017  PT End of Session - 10/24/17 1026    Visit Number  10    Number of Visits  22    Date for PT Re-Evaluation  12/05/17    Authorization Type  [employer]HC Medicare    PT Start Time  1025    PT Stop Time  1113 ended with 10 min ice pack    PT Time Calculation (min)  48 min    Activity Tolerance  Patient tolerated treatment well    Behavior During Therapy  [facility] for tasks assessed/performed       Past Medical History:  Diagnosis Date  . Anemia 04/30/2017  . CHF (congestive heart failure) (HCC)   . Diabetes mellitus   . Hypertension   . Hypokalemia 04/2017  . Macular degeneration disease    loss of central vision on left and decreased peripheral vision.   . Stroke (HCC) 12/2011   no residuals noted    Past Surgical History:  Procedure Laterality Date  . CHOLECYSTECTOMY    . CORONARY ARTERY BYPASS GRAFT N/A 04/18/2017   Procedure: CORONARY ARTERY BYPASS GRAFTING (CABG) times four using left internal mammary artery and right leg saphenous vein;  Surgeon: Kerin PernaVan Trigt, Peter, MD;  [facility];  Service: Open Heart Surgery;  Laterality: N/A;  . LEFT HEART CATH AND CORONARY ANGIOGRAPHY N/A 04/17/2017   Procedure: LEFT HEART CATH AND CORONARY ANGIOGRAPHY;  Surgeon: Marykay LexHarding, David W, MD;  [facility];  Service: Cardiovascular;  Laterality: N/A;  . ROTATOR CUFF REPAIR     left  . TEE WITHOUT CARDIOVERSION N/A 04/18/2017   Procedure: TRANSESOPHAGEAL ECHOCARDIOGRAM (TEE);  Surgeon: Donata ClayVan Trigt, Theron AristaPeter, MD;  [facility];  Service: Open Heart Surgery;  Laterality: N/A;    There were no vitals filed for this visit.  Subjective Assessment -  10/24/17 1026    Subjective  Pt. reporting cervical MRI scheduled next Tuesday.      Pertinent History  CABG x4, DM, HTN    Diagnostic tests  Xray: arthritis    Patient Stated Goals  improve pain and function of R UE    Currently in Pain?  Yes    Pain Score  4     Pain Location  Shoulder    Pain Orientation  Right    Pain Descriptors / Indicators  Tightness    Pain Type  Acute pain    Pain Onset  More than a month ago    Pain Frequency  Intermittent    Aggravating Factors   movement     Multiple Pain Sites  No         [facility] PT Assessment - 10/24/17 1042      Observation/Other Assessments   Focus on Therapeutic Outcomes (FOTO)   59% (41% limitation)      ROM / Strength   AROM / PROM / Strength  Strength      AROM   AROM Assessment Site  Shoulder    Right/Left Shoulder  Right    Right Shoulder Flexion  112 Degrees    Right Shoulder ABduction  85 Degrees    Right Shoulder Internal Rotation  -- FIR to iliac crest  Right Shoulder External Rotation  -- FER to lateral ear       PROM   PROM Assessment Site  Shoulder    Right/Left Shoulder  Right    Right Shoulder Flexion  140 Degrees    Right Shoulder ABduction  103 Degrees    Right Shoulder Internal Rotation  63 Degrees    Right Shoulder External Rotation  35 Degrees      Strength   Strength Assessment Site  Shoulder    Right/Left Shoulder  Right    Right Shoulder Flexion  3+/5    Right Shoulder ABduction  3+/5 Tested ~ 60 dg abduction position due to difficulty at 90dg    Right Shoulder Internal Rotation  3+/5    Right Shoulder External Rotation  3+/5                  [facility] Adult PT Treatment/Exercise - 10/24/17 1033      Shoulder Exercises: Seated   External Rotation  AAROM;Right;10 reps tactile cueing for positioning       Shoulder Exercises: Standing   Flexion  AAROM;Right;15 reps    Flexion Limitations  p-ball roll on wall       Shoulder Exercises: ROM/Strengthening   UBE (Upper Arm Bike)   L2.0 x 3 min forwards/73min back      Cryotherapy   Number Minutes Cryotherapy  10 Minutes    Cryotherapy Location  Shoulder R    Type of Cryotherapy  Ice pack      Manual Therapy   Manual Therapy  Joint mobilization;Passive ROM;Soft tissue mobilization;Muscle Energy Technique    Manual therapy comments  patient hooklying    Joint Mobilization  Grade III - IV R GH A/P and inferior joint mobs for improved ROM     Passive ROM  PROM R GH joint into all planes with overpressure; pain at end ranges of motion ROM improved following prolonged holds with stretches     Muscle Energy Technique  R shoulder contract/relax stretching with therapist into all motions               PT Short Term Goals - 10/24/17 1041      PT SHORT TERM GOAL #1   Title  patient to be independent with initial HEP    Status  Achieved      PT SHORT TERM GOAL #2   Title  patient ot demonstrate PROM R shoulder to WNL wihtout pain limiting    Status  On-going Still limited in all planes         PT Long Term Goals - 10/24/17 1054      PT LONG TERM GOAL #1   Title  patient to be independent with advanced HEP    Status  On-going      PT LONG TERM GOAL #2   Title  patient to demonstrate R shoulder AROM to WNL without pain limiting    Status  On-going still limited in all motions       PT LONG TERM GOAL #3   Title  patient to report ability to perform ADLs and household tasks without limitations at R shoulder    Status  On-going      PT LONG TERM GOAL #4   Title  patient to demosntrate R shoulder strength to >/= 4/5    Status  On-going            Plan - 10/24/17 1054    Clinical Impression Statement  Andrea [name]  reporting she has a cervical MRI scheduled for Tuesday of next week.  Admits to partial adherence to HEP.  Able to demo some improvement in PROM today however still most limited in R shoulder ER.  Progress with ROM likely slow at this point due to pt. poor HEP adherence since beginning of POC.  Pt.  still demonstrating capsular pattern at R shoulder most limited in PROM/AROM abduction, and ER motions.  Pt. reports she feels most limited reaching to hair for personal care.  Pt. encouraged to perform HEP daily for full benefit from therapy and to focus on wand ER activity for improvement in this motion.  Ended treatment with ice pack to R shoulder per pt. request to decrease post-exercise pain.  Pt. will likely benefit from further skilled therapy to improve functional ROM and strength.  Suggest extending pt. current POC to continue to target the above mentioned deficits.      PT Treatment/Interventions  ADLs/Self Care Home Management;Cryotherapy;Electrical Stimulation;Iontophoresis 4mg /ml Dexamethasone;Moist Heat;Therapeutic exercise;Therapeutic activities;Ultrasound;Neuromuscular re-education;Patient/family education;Manual techniques;Vasopneumatic Device;Taping;Dry needling;Passive range of motion    Consulted and Agree with Plan of Care  Patient       Patient will benefit from skilled therapeutic intervention in order to improve the following deficits and impairments:  Pain, Impaired UE functional use, Decreased strength, Decreased range of motion  Visit Diagnosis: Acute pain of right shoulder  Stiffness of right shoulder, not elsewhere classified  Abnormal posture  Other symptoms and signs involving the musculoskeletal system     Problem List Patient Active Problem List   Diagnosis Date Noted  . Pressure injury of skin 05/01/2017  . Coronary artery disease involving coronary bypass graft of native heart with angina pectoris (HCC) 04/30/2017  . SIRS (systemic inflammatory response syndrome) (HCC) 04/30/2017  . Symptomatic anemia 04/30/2017  . Hyperbilirubinemia 04/30/2017  . AKI (acute kidney injury) (HCC) 04/30/2017  . History of CVA in adulthood 04/30/2017  . Fever in adult   . Hypokalemia   . Jaundice   . Hx of CABG 04/18/2017  . Non-ST elevation (NSTEMI) myocardial  infarction (HCC)   . Hyperlipidemia 03/14/2016  . Hypertensive heart disease 12/17/2015  . Diabetes mellitus, type II, insulin dependent (HCC) 01/04/2012  . Hypertension   . Stroke [facility])     Kermit Balo, PTA 10/24/17 5:50 PM  [facility] [address]  [address] [place], [place], [address] Phone: [phone]   Fax:  [phone]  Name: Andrea [name] MRN: [id number] Date of Birth: 02/27/1946

## 2017-10-25 ENCOUNTER — Ambulatory Visit: Payer: Medicare Other

## 2017-10-25 VITALS — HR 65

## 2017-10-25 DIAGNOSIS — M25511 Pain in right shoulder: Secondary | ICD-10-CM | POA: Diagnosis not present

## 2017-10-25 DIAGNOSIS — R29898 Other symptoms and signs involving the musculoskeletal system: Secondary | ICD-10-CM

## 2017-10-25 DIAGNOSIS — M25611 Stiffness of right shoulder, not elsewhere classified: Secondary | ICD-10-CM

## 2017-10-25 DIAGNOSIS — R293 Abnormal posture: Secondary | ICD-10-CM

## 2017-10-25 NOTE — Therapy (Signed)
[facility] [address]  [address] [place], [place], [address] Phone: [phone]   Fax:  [phone]  Physical Therapy Treatment  Patient Details  Name: Andrea [name] MRN: [id number] Date of Birth: [birth year] Referring Provider: Dr. Eula Listen   Encounter Date: 10/25/2017  PT End of Session - 10/25/17 1333    Visit Number  11    Number of Visits  22    Date for PT Re-Evaluation  12/05/17    Authorization Type  UHC Medicare    PT Start Time  1329    PT Stop Time  1410    PT Time Calculation (min)  41 min    Activity Tolerance  Patient tolerated treatment well    Behavior During Therapy  [facility] for tasks assessed/performed       Past Medical History:  Diagnosis Date  . Anemia 04/30/2017  . CHF (congestive heart failure) (HCC)   . Diabetes mellitus   . Hypertension   . Hypokalemia 04/2017  . Macular degeneration disease    loss of central vision on left and decreased peripheral vision.   . Stroke (HCC) 12/2011   no residuals noted    Past Surgical History:  Procedure Laterality Date  . CHOLECYSTECTOMY    . CORONARY ARTERY BYPASS GRAFT N/A 04/18/2017   Procedure: CORONARY ARTERY BYPASS GRAFTING (CABG) times four using left internal mammary artery and right leg saphenous vein;  Surgeon: Kerin Perna, MD;  [facility];  Service: Open Heart Surgery;  Laterality: N/A;  . LEFT HEART CATH AND CORONARY ANGIOGRAPHY N/A 04/17/2017   Procedure: LEFT HEART CATH AND CORONARY ANGIOGRAPHY;  Surgeon: Marykay Lex, MD;  [facility]VAS[facility] CV LAB;  Service: Cardiovascular;  Laterality: N/A;  . ROTATOR CUFF REPAIR     left  . TEE WITHOUT CARDIOVERSION N/A 04/18/2017   Procedure: TRANSESOPHAGEAL ECHOCARDIOGRAM (TEE);  Surgeon: Donata Clay, Theron Arista, MD;  Location: [facility] OR;  Service: Open Heart Surgery;  Laterality: N/A;    Vitals:   10/25/17 1332  Pulse: 65    Subjective Assessment - 10/25/17 1332    Subjective   Pt. reporting some R wrist pain today which she feels is "nerve pain".      Pertinent History  CABG x4, DM, HTN    Diagnostic tests  Xray: arthritis    Patient Stated Goals  improve pain and function of R UE    Currently in Pain?  Yes    Pain Score  2     Pain Location  Shoulder    Pain Orientation  Right    Pain Descriptors / Indicators  Tightness    Pain Type  Acute pain    Pain Onset  More than a month ago    Pain Frequency  Intermittent    Aggravating Factors   Movement     Multiple Pain Sites  No                      [facility] Adult PT Treatment/Exercise - 10/25/17 1654      Shoulder Exercises: Standing   External Rotation  Right;15 reps;Strengthening    Theraband Level (Shoulder External Rotation)  Level 2 (Red)    External Rotation Limitations  cues required for full ROM     Internal Rotation  Right;15 reps;Theraband    Theraband Level (Shoulder Internal Rotation)  Level 2 (Red)    Internal Rotation Limitations  ER stretch at end range  Flexion  AAROM;Right;10 reps Cues for end range stretch     Flexion Limitations  p-ball roll on wall     ABduction  Right;10 reps;AAROM cues for end range stretch     ABduction Limitations  p-ball on wall     Row  Strengthening;Both;15 reps;Theraband Pt. admits to not performing this with HEP     Theraband Level (Shoulder Row)  Level 2 (Red)      Shoulder Exercises: ROM/Strengthening   UBE (Upper Arm Bike)  L2.0 x 3 min forwards/503min back    Other ROM/Strengthening Exercises  BATCA pulldown 10# 3" x 10 reps; scaption stretch at top of movement x 3 sec hold  Terminated due to activing lethargic       Cryotherapy   Number Minutes Cryotherapy  10 Minutes    Cryotherapy Location  Shoulder R    Type of Cryotherapy  Ice pack      Manual Therapy   Manual Therapy  Joint mobilization;Passive ROM;Soft tissue mobilization;Muscle Energy Technique    Manual therapy comments  patient hooklying    Joint Mobilization  Grade III - IV R  GH A/P and inferior joint mobs for improved ROM     Soft tissue mobilization  STM to R proximal biceps over area of most tenderness; limited repsonse and very ttp     Passive ROM  PROM R GH joint into all planes with overpressure; pain at end ranges of motion    Muscle Energy Technique  R shoulder contract/relax stretching with therapist into all motions               PT Short Term Goals - 10/24/17 1041      PT SHORT TERM GOAL #1   Title  patient to be independent with initial HEP    Status  Achieved      PT SHORT TERM GOAL #2   Title  patient ot demonstrate PROM R shoulder to WNL wihtout pain limiting    Status  On-going Still limited in all planes         PT Long Term Goals - 10/24/17 1054      PT LONG TERM GOAL #1   Title  patient to be independent with advanced HEP    Status  On-going      PT LONG TERM GOAL #2   Title  patient to demonstrate R shoulder AROM to WNL without pain limiting    Status  On-going still limited in all motions       PT LONG TERM GOAL #3   Title  patient to report ability to perform ADLs and household tasks without limitations at R shoulder    Status  On-going      PT LONG TERM GOAL #4   Title  patient to demosntrate R shoulder strength to >/= 4/5    Status  On-going            Plan - 10/25/17 1334    Clinical Impression Statement  Pt. doing well today reporting BG at 135 earlier today.  Pt. with complaint of R anterior shoulder/proximal biceps pain and with palpable TP in this area, which had limited response to manual STM/TPR today.  Tolerated all ROM and strengthening activities well in treatment until onset of symptoms of confusion and clumsiness near end of treatment while seated performing Lat Pulldown machine.  Pt. symptoms improving after drinking water and lying supine x 5 min.  Pt. left BG meter at home today thus unable to  formally monitor BG.  Encouraged pt. to bring meter with her to upcoming visit.  Pt. also encouraged to  consult primary care physician regarding inconsistent BG levels by supervising PT today.  Will continue to progress toward goals.      PT Treatment/Interventions  ADLs/Self Care Home Management;Cryotherapy;Electrical Stimulation;Iontophoresis 4mg /ml Dexamethasone;Moist Heat;Therapeutic exercise;Therapeutic activities;Ultrasound;Neuromuscular re-education;Patient/family education;Manual techniques;Vasopneumatic Device;Taping;Dry needling;Passive range of motion    Consulted and Agree with Plan of Care  Patient       Patient will benefit from skilled therapeutic intervention in order to improve the following deficits and impairments:  Pain, Impaired UE functional use, Decreased strength, Decreased range of motion  Visit Diagnosis: Acute pain of right shoulder  Stiffness of right shoulder, not elsewhere classified  Abnormal posture  Other symptoms and signs involving the musculoskeletal system     Problem List Patient Active Problem List   Diagnosis Date Noted  . Pressure injury of skin 05/01/2017  . Coronary artery disease involving coronary bypass graft of native heart with angina pectoris (HCC) 04/30/2017  . SIRS (systemic inflammatory response syndrome) (HCC) 04/30/2017  . Symptomatic anemia 04/30/2017  . Hyperbilirubinemia 04/30/2017  . AKI (acute kidney injury) (HCC) 04/30/2017  . History of CVA in adulthood 04/30/2017  . Fever in adult   . Hypokalemia   . Jaundice   . Hx of CABG 04/18/2017  . Non-ST elevation (NSTEMI) myocardial infarction (HCC)   . Hyperlipidemia 03/14/2016  . Hypertensive heart disease 12/17/2015  . Diabetes mellitus, type II, insulin dependent (HCC) 01/04/2012  . Hypertension   . Stroke [facility])     Kermit Balo, [place] 10/25/17 6:07 PM  [facility] [address]  [address] [place], [place], [address] Phone: [phone]   Fax:  [phone]  Name: MALANEY MCBEAN MRN: [id number] Date of Birth:  25-Aug-1945

## 2017-10-25 NOTE — Therapy (Deleted)
[facility] Outpatient Rehabilitation [facility] [address]  [address] [place], [place], [address] Phone: [phone]   Fax:  [phone]  Physical Therapy Evaluation  Patient Details  Name: Andrea [name] MRN: [id number] Date of Birth: [birth year] Referring Provider: Dr. Eula Listen   Encounter Date: 10/25/2017  PT End of Session - 10/25/17 1333    Visit Number  11    Number of Visits  22    Date for PT Re-Evaluation  12/05/17    Authorization Type  UHC Medicare    PT Start Time  1329    PT Stop Time  1410    PT Time Calculation (min)  41 min    Activity Tolerance  Patient tolerated treatment well    Behavior During Therapy  [facility] for tasks assessed/performed       Past Medical History:  Diagnosis Date  . Anemia 04/30/2017  . CHF (congestive heart failure) (HCC)   . Diabetes mellitus   . Hypertension   . Hypokalemia 04/2017  . Macular degeneration disease    loss of central vision on left and decreased peripheral vision.   . Stroke (HCC) 12/2011   no residuals noted    Past Surgical History:  Procedure Laterality Date  . CHOLECYSTECTOMY    . CORONARY ARTERY BYPASS GRAFT N/A 04/18/2017   Procedure: CORONARY ARTERY BYPASS GRAFTING (CABG) times four using left internal mammary artery and right leg saphenous vein;  Surgeon: Kerin Perna, MD;  Location: [facility] OR;  Service: Open Heart Surgery;  Laterality: N/A;  . LEFT HEART CATH AND CORONARY ANGIOGRAPHY N/A 04/17/2017   Procedure: LEFT HEART CATH AND CORONARY ANGIOGRAPHY;  Surgeon: Marykay Lex, MD;  Location: [facility] INVASIVE CV LAB;  Service: Cardiovascular;  Laterality: N/A;  . ROTATOR CUFF REPAIR     left  . TEE WITHOUT CARDIOVERSION N/A 04/18/2017   Procedure: TRANSESOPHAGEAL ECHOCARDIOGRAM (TEE);  Surgeon: Donata Clay, Theron Arista, MD;  Location: [facility] OR;  Service: Open Heart Surgery;  Laterality: N/A;    Vitals:   10/25/17 1332  Pulse: 65     Subjective Assessment - 10/25/17 1332    Subjective   Pt. reporting some R wrist pain today which she feels is "nerve pain".      Pertinent History  CABG x4, DM, HTN    Diagnostic tests  Xray: arthritis    Patient Stated Goals  improve pain and function of R UE    Currently in Pain?  Yes    Pain Score  2     Pain Location  Shoulder    Pain Orientation  Right    Pain Descriptors / Indicators  Tightness    Pain Type  Acute pain    Pain Onset  More than a month ago    Pain Frequency  Intermittent    Aggravating Factors   Movement     Multiple Pain Sites  No         [facility] PT Assessment - 10/24/17 1042      Observation/Other Assessments   Focus on Therapeutic Outcomes (FOTO)   59% (41% limitation)      ROM / Strength   AROM / PROM / Strength  Strength      AROM   AROM Assessment Site  Shoulder    Right/Left Shoulder  Right    Right Shoulder Flexion  112 Degrees    Right Shoulder ABduction  85 Degrees    Right Shoulder Internal Rotation  -- FIR to iliac  crest     Right Shoulder External Rotation  -- FER to lateral ear       PROM   PROM Assessment Site  Shoulder    Right/Left Shoulder  Right    Right Shoulder Flexion  140 Degrees    Right Shoulder ABduction  103 Degrees    Right Shoulder Internal Rotation  63 Degrees    Right Shoulder External Rotation  35 Degrees      Strength   Strength Assessment Site  Shoulder    Right/Left Shoulder  Right    Right Shoulder Flexion  3+/5    Right Shoulder ABduction  3+/5 Tested ~ 60 dg abduction position due to difficulty at 90dg    Right Shoulder Internal Rotation  3+/5    Right Shoulder External Rotation  3+/5             Objective measurements completed on examination: See above findings.      [facility] Adult PT Treatment/Exercise - 10/25/17 1654      Shoulder Exercises: Standing   External Rotation  Right;15 reps;Strengthening    Theraband Level (Shoulder External Rotation)  Level 2 (Red)    External Rotation Limitations  cues required for full ROM     Internal Rotation   Right;15 reps;Theraband    Theraband Level (Shoulder Internal Rotation)  Level 2 (Red)    Internal Rotation Limitations  ER stretch at end range     Flexion  AAROM;Right;10 reps Cues for end range stretch     Flexion Limitations  p-ball roll on wall     ABduction  Right;10 reps;AAROM cues for end range stretch     ABduction Limitations  p-ball on wall     Row  Strengthening;Both;15 reps;Theraband Pt. admits to not performing this with HEP     Theraband Level (Shoulder Row)  Level 2 (Red)      Shoulder Exercises: ROM/Strengthening   UBE (Upper Arm Bike)  L2.0 x 3 min forwards/28min back    Other ROM/Strengthening Exercises  BATCA pulldown 10# 3" x 10 reps; scaption stretch at top of movement x 3 sec hold  Terminated due to activing lethargic       Cryotherapy   Number Minutes Cryotherapy  10 Minutes    Cryotherapy Location  Shoulder R    Type of Cryotherapy  Ice pack      Manual Therapy   Manual Therapy  Joint mobilization;Passive ROM;Soft tissue mobilization;Muscle Energy Technique    Manual therapy comments  patient hooklying    Joint Mobilization  Grade III - IV R GH A/P and inferior joint mobs for improved ROM     Soft tissue mobilization  STM to R proximal biceps over area of most tenderness     Passive ROM  PROM R GH joint into all planes with overpressure; pain at end ranges of motion    Muscle Energy Technique  R shoulder contract/relax stretching with therapist into all motions               PT Short Term Goals - 10/24/17 1041      PT SHORT TERM GOAL #1   Title  patient to be independent with initial HEP    Status  Achieved      PT SHORT TERM GOAL #2   Title  patient ot demonstrate PROM R shoulder to WNL wihtout pain limiting    Status  On-going Still limited in all planes         PT Long  Term Goals - 10/24/17 1054      PT LONG TERM GOAL #1   Title  patient to be independent with advanced HEP    Status  On-going      PT LONG TERM GOAL #2   Title   patient to demonstrate R shoulder AROM to WNL without pain limiting    Status  On-going still limited in all motions       PT LONG TERM GOAL #3   Title  patient to report ability to perform ADLs and household tasks without limitations at R shoulder    Status  On-going      PT LONG TERM GOAL #4   Title  patient to demosntrate R shoulder strength to >/= 4/5    Status  On-going             Plan - 10/25/17 1334    Clinical Impression Statement  Pt. doing well today reporting BG at 135 earlier today.  Tolerated all ROM and strengthening activities well in treatment until onset of symptoms of confusion and clumsiness near end of treatment while seated performing Lat Pulldown machine.  Pt. symptoms improving after drinking water and lying supine x 5 min.  Pt. left BG meter at home today thus unable to formally monitor BG.  Encouraged pt. to bring meter with her to upcoming visit.  Pt. also encouraged to consult primary care physician regarding inconsistent BG levels by supervising PT today.  Will continue to progress toward goals.      PT Treatment/Interventions  ADLs/Self Care Home Management;Cryotherapy;Electrical Stimulation;Iontophoresis 4mg /ml Dexamethasone;Moist Heat;Therapeutic exercise;Therapeutic activities;Ultrasound;Neuromuscular re-education;Patient/family education;Manual techniques;Vasopneumatic Device;Taping;Dry needling;Passive range of motion    Consulted and Agree with Plan of Care  Patient       Patient will benefit from skilled therapeutic intervention in order to improve the following deficits and impairments:  Pain, Impaired UE functional use, Decreased strength, Decreased range of motion  Visit Diagnosis: Acute pain of right shoulder  Stiffness of right shoulder, not elsewhere classified  Abnormal posture  Other symptoms and signs involving the musculoskeletal system     Problem List Patient Active Problem List   Diagnosis Date Noted  . Pressure injury of  skin 05/01/2017  . Coronary artery disease involving coronary bypass graft of native heart with angina pectoris (HCC) 04/30/2017  . SIRS (systemic inflammatory response syndrome) (HCC) 04/30/2017  . Symptomatic anemia 04/30/2017  . Hyperbilirubinemia 04/30/2017  . AKI (acute kidney injury) (HCC) 04/30/2017  . History of CVA in adulthood 04/30/2017  . Fever in adult   . Hypokalemia   . Jaundice   . Hx of CABG 04/18/2017  . Non-ST elevation (NSTEMI) myocardial infarction (HCC)   . Hyperlipidemia 03/14/2016  . Hypertensive heart disease 12/17/2015  . Diabetes mellitus, type II, insulin dependent (HCC) 01/04/2012  . Hypertension   . Stroke [facility](HCC)     Kermit BaloMicah Normagene Harvie 10/25/2017, 6:03 PM  [facility] [address]  [address] [place], [place], [address] Phone: [phone]   Fax:  [phone]  Name: Andrea [name] MRN: [id number] Date of Birth: [birth year]

## 2017-10-31 ENCOUNTER — Ambulatory Visit
Admission: RE | Admit: 2017-10-31 | Discharge: 2017-10-31 | Disposition: A | Discharge status: Home / Self Care | Payer: Medicare Other | Source: Ambulatory Visit | Attending: Neurology | Admitting: Neurology

## 2017-10-31 ENCOUNTER — Encounter: Payer: Medicare Other | Admitting: Physical Therapy

## 2017-10-31 DIAGNOSIS — R292 Abnormal reflex: Secondary | ICD-10-CM

## 2017-10-31 DIAGNOSIS — M4712 Other spondylosis with myelopathy, cervical region: Secondary | ICD-10-CM

## 2017-10-31 DIAGNOSIS — M5412 Radiculopathy, cervical region: Secondary | ICD-10-CM

## 2017-11-02 ENCOUNTER — Telehealth: Payer: Self-pay | Admitting: *Deleted

## 2017-11-02 ENCOUNTER — Telehealth: Payer: Self-pay | Admitting: Neurology

## 2017-11-02 NOTE — Telephone Encounter (Signed)
-----   Message from [place] Chardonika K Patel, DO sent at 11/02/2017 12:18 PM EDT ----- Please call and inform patient that her MRI shows age-related changes as well as nerve impingement, which could be causing her arm pain. Recommend she continue physical therapy, and if no improvement we can refer her to a spine specialist. She will need a f/u in 3 months, Thanks.

## 2017-11-02 NOTE — Telephone Encounter (Signed)
Patient wants to know the results of the MRI

## 2017-11-02 NOTE — Telephone Encounter (Signed)
Patient given the results and instructions.  I will mail her an appointment card for 3 month follow up.  Patient stated that she does not want to see a spine specialist.

## 2017-11-02 NOTE — Telephone Encounter (Signed)
See next note

## 2017-11-03 ENCOUNTER — Ambulatory Visit: Payer: Medicare Other | Admitting: Physical Therapy

## 2017-11-07 ENCOUNTER — Ambulatory Visit: Payer: Medicare Other | Admitting: Physical Therapy

## 2017-11-09 ENCOUNTER — Ambulatory Visit: Payer: Medicare Other | Attending: Family Medicine

## 2017-11-09 DIAGNOSIS — R29898 Other symptoms and signs involving the musculoskeletal system: Secondary | ICD-10-CM | POA: Diagnosis present

## 2017-11-09 DIAGNOSIS — R293 Abnormal posture: Secondary | ICD-10-CM | POA: Diagnosis present

## 2017-11-09 DIAGNOSIS — M25511 Pain in right shoulder: Secondary | ICD-10-CM | POA: Insufficient documentation

## 2017-11-09 DIAGNOSIS — M25611 Stiffness of right shoulder, not elsewhere classified: Secondary | ICD-10-CM | POA: Diagnosis present

## 2017-11-09 NOTE — Therapy (Signed)
[facility] [address]  [address] [place], [place], [address] Phone: [phone]   Fax:  [phone]  Physical Therapy Treatment  Patient Details  Name: Andrea [name] MRN: [id number] Date of Birth: [birth year] Referring Provider: Dr. Eula Listenominic McKinley   Encounter Date: 11/09/2017  PT End of Session - 11/09/17 1545    Visit Number  12    Number of Visits  22    Date for PT Re-Evaluation  12/05/17    Authorization Type  UHC Medicare    PT Start Time  1543    PT Stop Time  1625 10 min ice pack     PT Time Calculation (min)  42 min    Activity Tolerance  Patient tolerated treatment well    Behavior During Therapy  [facility] for tasks assessed/performed       Past Medical History:  Diagnosis Date  . Anemia 04/30/2017  . CHF (congestive heart failure) (HCC)   . Diabetes mellitus   . Hypertension   . Hypokalemia 04/2017  . Macular degeneration disease    loss of central vision on left and decreased peripheral vision.   . Stroke (HCC) 12/2011   no residuals noted    Past Surgical History:  Procedure Laterality Date  . CHOLECYSTECTOMY    . CORONARY ARTERY BYPASS GRAFT N/A 04/18/2017   Procedure: CORONARY ARTERY BYPASS GRAFTING (CABG) times four using left internal mammary artery and right leg saphenous vein;  Surgeon: Kerin PernaVan Trigt, Peter, MD;  [facility];  Service: Open Heart Surgery;  Laterality: N/A;  . LEFT HEART CATH AND CORONARY ANGIOGRAPHY N/A 04/17/2017   Procedure: LEFT HEART CATH AND CORONARY ANGIOGRAPHY;  Surgeon: Marykay LexHarding, David W, MD;  [facility];  Service: Cardiovascular;  Laterality: N/A;  . ROTATOR CUFF REPAIR     left  . TEE WITHOUT CARDIOVERSION N/A 04/18/2017   Procedure: TRANSESOPHAGEAL ECHOCARDIOGRAM (TEE);  Surgeon: Donata ClayVan Trigt, Theron AristaPeter, MD;  [facility];  Service: Open Heart Surgery;  Laterality: N/A;    There were no vitals filed for this visit.  Subjective Assessment - 11/09/17 1551     Subjective  Pt. reporting, "I got a shot in my shoulder yesterday at the doctor's office".      Pertinent History  CABG x4, DM, HTN    Diagnostic tests  Xray: arthritis    Patient Stated Goals  improve pain and function of R UE    Currently in Pain?  Yes    Pain Score  1     Pain Location  Shoulder    Pain Orientation  Right    Pain Descriptors / Indicators  Tightness    Pain Type  Acute pain    Pain Onset  More than a month ago    Pain Frequency  Intermittent    Aggravating Factors   Movement     Multiple Pain Sites  No                       [facility] Adult PT Treatment/Exercise - 11/09/17 1703      Shoulder Exercises: Seated   External Rotation  AAROM;Right;10 reps Therapist overpressure into R ER stretch     Flexion  Right;AAROM;15 reps with gentle stretch     Flexion Limitations  green p-ball roll outs       Shoulder Exercises: ROM/Strengthening   UBE (Upper Arm Bike)  L2.0 x 2 min forwards/542min back  Wall Wash  R Flexion, scaption wall slides 5" x 10 reps each way  L UE assist for full Range; Pt. not performing this with HEP      Cryotherapy   Number Minutes Cryotherapy  10 Minutes    Cryotherapy Location  Shoulder R    Type of Cryotherapy  Ice pack      Manual Therapy   Manual Therapy  Joint mobilization;Passive ROM;Muscle Energy Technique    Manual therapy comments  patient hooklying    Joint Mobilization  Grade III - IV R GH A/P and inferior joint mobs for improved ROM     Passive ROM  R shoulder PROM all directions with prolonged holds ~ 30 sec each direction - increased pain                PT Short Term Goals - 10/24/17 1041      PT SHORT TERM GOAL #1   Title  patient to be independent with initial HEP    Status  Achieved      PT SHORT TERM GOAL #2   Title  patient ot demonstrate PROM R shoulder to WNL wihtout pain limiting    Status  On-going Still limited in all planes         PT Long Term Goals - 10/24/17 1054      PT LONG  TERM GOAL #1   Title  patient to be independent with advanced HEP    Status  On-going      PT LONG TERM GOAL #2   Title  patient to demonstrate R shoulder AROM to WNL without pain limiting    Status  On-going still limited in all motions       PT LONG TERM GOAL #3   Title  patient to report ability to perform ADLs and household tasks without limitations at R shoulder    Status  On-going      PT LONG TERM GOAL #4   Title  patient to demosntrate R shoulder strength to >/= 4/5    Status  On-going            Plan - 11/09/17 1551    Clinical Impression Statement  Pt. arriving thirteen minutes late to treatment thus session time limited.  Reports she, "got a shot in the shoulder" from MD yesterday and some improvement in pain levels today.  Requiring increased time with all activities today and cueing to stay on task due to tendency to converse and being easily distractible.  Pt. reporting she has not been able to perform HEP since last visit due to being "busy".  Pt. PROM visibly improved following prolonged stretching activities today and pt. encouraged to perform HEP daily for full benefit from therapy.  Required cueing with ROM activities for appropriate hold times and overall technique.  Will continue to progress toward goals.        PT Treatment/Interventions  ADLs/Self Care Home Management;Cryotherapy;Electrical Stimulation;Iontophoresis 4mg /ml Dexamethasone;Moist Heat;Therapeutic exercise;Therapeutic activities;Ultrasound;Neuromuscular re-education;Patient/family education;Manual techniques;Vasopneumatic Device;Taping;Dry needling;Passive range of motion    Consulted and Agree with Plan of Care  Patient       Patient will benefit from skilled therapeutic intervention in order to improve the following deficits and impairments:  Pain, Impaired UE functional use, Decreased strength, Decreased range of motion  Visit Diagnosis: Acute pain of right shoulder  Stiffness of right  shoulder, not elsewhere classified  Abnormal posture  Other symptoms and signs involving the musculoskeletal system     Problem List  Patient Active Problem List   Diagnosis Date Noted  . Pressure injury of skin 05/01/2017  . Coronary artery disease involving coronary bypass graft of native heart with angina pectoris (HCC) 04/30/2017  . SIRS (systemic inflammatory response syndrome) (HCC) 04/30/2017  . Symptomatic anemia 04/30/2017  . Hyperbilirubinemia 04/30/2017  . AKI (acute kidney injury) (HCC) 04/30/2017  . History of CVA in adulthood 04/30/2017  . Fever in adult   . Hypokalemia   . Jaundice   . Hx of CABG 04/18/2017  . Non-ST elevation (NSTEMI) myocardial infarction (HCC)   . Hyperlipidemia 03/14/2016  . Hypertensive heart disease 12/17/2015  . Diabetes mellitus, type II, insulin dependent (HCC) 01/04/2012  . Hypertension   . Stroke [facility])     Kermit Balo, [place] 11/09/17 5:36 PM  [facility] [address]  [address] [place], [place], [address] Phone: [phone]   Fax:  [phone]  Name: SHAKTHI SCIPIO MRN: [id number] Date of Birth: [birth year]

## 2017-11-10 ENCOUNTER — Ambulatory Visit: Payer: Medicare Other

## 2017-11-14 ENCOUNTER — Encounter: Payer: Self-pay | Admitting: Physical Therapy

## 2017-11-14 ENCOUNTER — Ambulatory Visit: Payer: Medicare Other | Admitting: Physical Therapy

## 2017-11-14 DIAGNOSIS — M25611 Stiffness of right shoulder, not elsewhere classified: Secondary | ICD-10-CM

## 2017-11-14 DIAGNOSIS — R293 Abnormal posture: Secondary | ICD-10-CM

## 2017-11-14 DIAGNOSIS — M25511 Pain in right shoulder: Secondary | ICD-10-CM | POA: Diagnosis not present

## 2017-11-14 DIAGNOSIS — R29898 Other symptoms and signs involving the musculoskeletal system: Secondary | ICD-10-CM

## 2017-11-14 NOTE — Therapy (Signed)
[facility] [address]  [address] [place], [place], [address] Phone: [phone]   Fax:  [phone]  Physical Therapy Treatment  Patient Details  Name: Andrea [name] MRN: [id number] Date of Birth: 03/20/1946 Referring Provider: Dr. Eula Listenominic McKinley   Encounter Date: 11/14/2017  PT End of Session - 11/14/17 1456    Visit Number  13    Number of Visits  22    Date for PT Re-Evaluation  12/05/17    Authorization Type  UHC Medicare    PT Start Time  1453    PT Stop Time  1533    PT Time Calculation (min)  40 min    Activity Tolerance  Patient tolerated treatment well    Behavior During Therapy  [facility] for tasks assessed/performed       Past Medical History:  Diagnosis Date  . Anemia 04/30/2017  . CHF (congestive heart failure) (HCC)   . Diabetes mellitus   . Hypertension   . Hypokalemia 04/2017  . Macular degeneration disease    loss of central vision on left and decreased peripheral vision.   . Stroke (HCC) 12/2011   no residuals noted    Past Surgical History:  Procedure Laterality Date  . CHOLECYSTECTOMY    . CORONARY ARTERY BYPASS GRAFT N/A 04/18/2017   Procedure: CORONARY ARTERY BYPASS GRAFTING (CABG) times four using left internal mammary artery and right leg saphenous vein;  Surgeon: Kerin PernaVan Trigt, Peter, MD;  [facility];  Service: Open Heart Surgery;  Laterality: N/A;  . LEFT HEART CATH AND CORONARY ANGIOGRAPHY N/A 04/17/2017   Procedure: LEFT HEART CATH AND CORONARY ANGIOGRAPHY;  Surgeon: Marykay LexHarding, David W, MD;  [facility];  Service: Cardiovascular;  Laterality: N/A;  . ROTATOR CUFF REPAIR     left  . TEE WITHOUT CARDIOVERSION N/A 04/18/2017   Procedure: TRANSESOPHAGEAL ECHOCARDIOGRAM (TEE);  Surgeon: Donata ClayVan Trigt, Theron AristaPeter, MD;  [facility];  Service: Open Heart Surgery;  Laterality: N/A;    There were no vitals filed for this visit.  Subjective Assessment - 11/14/17 1454    Subjective   feeling some better after injection; has been doing pulley at home    Pertinent History  CABG x4, DM, HTN    Diagnostic tests  Xray: arthritis    Patient Stated Goals  improve pain and function of R UE    Currently in Pain?  Yes    Pain Score  4     Pain Location  Shoulder    Pain Orientation  Right    Pain Descriptors / Indicators  Aching;Tightness    Pain Type  Acute pain                       [facility] Adult PT Treatment/Exercise - 11/14/17 1457      Shoulder Exercises: Pulleys   Flexion  2 minutes    ABduction  2 minutes      Shoulder Exercises: ROM/Strengthening   Ball on Wall  walking orange ball up wall x 12 reps    Other ROM/Strengthening Exercises  pull down - 10# x 12 reps - emphasis on flexion stretch at top x 10 sec each    Other ROM/Strengthening Exercises  counter walkouts x 10 reps      Manual Therapy   Manual Therapy  Passive ROM    Manual therapy comments  patient hooklying    Passive ROM  PROM all planes -  very restriced and heavy guarding - difficult to progress               PT Short Term Goals - 10/24/17 1041      PT SHORT TERM GOAL #1   Title  patient to be independent with initial HEP    Status  Achieved      PT SHORT TERM GOAL #2   Title  patient ot demonstrate PROM R shoulder to WNL wihtout pain limiting    Status  On-going Still limited in all planes         PT Long Term Goals - 10/24/17 1054      PT LONG TERM GOAL #1   Title  patient to be independent with advanced HEP    Status  On-going      PT LONG TERM GOAL #2   Title  patient to demonstrate R shoulder AROM to WNL without pain limiting    Status  On-going still limited in all motions       PT LONG TERM GOAL #3   Title  patient to report ability to perform ADLs and household tasks without limitations at R shoulder    Status  On-going      PT LONG TERM GOAL #4   Title  patient to demosntrate R shoulder strength to >/= 4/5    Status  On-going             Plan - 11/14/17 1456    Clinical Impression Statement  Difficult to progress patient as she is very much guarded with all motion, as well as difficult to direct patient in session. Patient reporting home use of pulleys, but not visibly seeing improvement in ROM in session. Heavy education today on need for compliance with POC/HEP to continue to progress functional use of R shoulder. Patient given counter walk outs as well as alternate ER AAROM today for hopeful improved compliance.     PT Treatment/Interventions  ADLs/Self Care Home Management;Cryotherapy;Electrical Stimulation;Iontophoresis 4mg /ml Dexamethasone;Moist Heat;Therapeutic exercise;Therapeutic activities;Ultrasound;Neuromuscular re-education;Patient/family education;Manual techniques;Vasopneumatic Device;Taping;Dry needling;Passive range of motion    Consulted and Agree with Plan of Care  Patient       Patient will benefit from skilled therapeutic intervention in order to improve the following deficits and impairments:  Pain, Impaired UE functional use, Decreased strength, Decreased range of motion  Visit Diagnosis: Acute pain of right shoulder  Stiffness of right shoulder, not elsewhere classified  Abnormal posture  Other symptoms and signs involving the musculoskeletal system     Problem List Patient Active Problem List   Diagnosis Date Noted  . Pressure injury of skin 05/01/2017  . Coronary artery disease involving coronary bypass graft of native heart with angina pectoris (HCC) 04/30/2017  . SIRS (systemic inflammatory response syndrome) (HCC) 04/30/2017  . Symptomatic anemia 04/30/2017  . Hyperbilirubinemia 04/30/2017  . AKI (acute kidney injury) (HCC) 04/30/2017  . History of CVA in adulthood 04/30/2017  . Fever in adult   . Hypokalemia   . Jaundice   . Hx of CABG 04/18/2017  . Non-ST elevation (NSTEMI) myocardial infarction (HCC)   . Hyperlipidemia 03/14/2016  . Hypertensive heart disease  12/17/2015  . Diabetes mellitus, type II, insulin dependent (HCC) 01/04/2012  . Hypertension   . Stroke [facility])     Kipp Laurence, PT, DPT 11/14/17 5:18 PM   [facility] [address]  [address] [place], [place], [address] Phone: [phone]   Fax:  [phone]  Name: Andrea [name] MRN: [id number] Date of Birth: 09/03/1945

## 2017-11-14 NOTE — Patient Instructions (Signed)
Cane Exercise: External Rotation    Lie with elbows on surface, even with shoulders. Hold cane above chest, palms toward toes. Lower arms back as far as possible. Keep elbows on surface. Hold __10__ seconds. Repeat _10-15___ times.

## 2017-11-16 ENCOUNTER — Encounter: Payer: Self-pay | Admitting: Physical Therapy

## 2017-11-16 ENCOUNTER — Ambulatory Visit: Payer: Medicare Other | Admitting: Physical Therapy

## 2017-11-16 DIAGNOSIS — M25511 Pain in right shoulder: Secondary | ICD-10-CM

## 2017-11-16 DIAGNOSIS — R29898 Other symptoms and signs involving the musculoskeletal system: Secondary | ICD-10-CM

## 2017-11-16 DIAGNOSIS — M25611 Stiffness of right shoulder, not elsewhere classified: Secondary | ICD-10-CM

## 2017-11-16 DIAGNOSIS — R293 Abnormal posture: Secondary | ICD-10-CM

## 2017-11-16 NOTE — Therapy (Signed)
[facility] [place] [address]  [address] [place], [place], [address] Phone: [phone]   Fax:  [phone]  Physical Therapy Treatment  Patient Details  Name: Andrea [name] MRN: [id number] Date of Birth: 1946/07/16 Referring Provider: Dr. Eula Listenominic McKinley   Encounter Date: 11/16/2017  PT End of Session - 11/16/17 1624    Visit Number  14    Number of Visits  22    Date for PT Re-Evaluation  12/05/17    Authorization Type  [employer]HC Medicare    PT Start Time  1618    PT Stop Time  1700    PT Time Calculation (min)  42 min    Activity Tolerance  Patient tolerated treatment well    Behavior During Therapy  [facility] for tasks assessed/performed       Past Medical History:  Diagnosis Date  . Anemia 04/30/2017  . CHF (congestive heart failure) (HCC)   . Diabetes mellitus   . Hypertension   . Hypokalemia 04/2017  . Macular degeneration disease    loss of central vision on left and decreased peripheral vision.   . Stroke (HCC) 12/2011   no residuals noted    Past Surgical History:  Procedure Laterality Date  . CHOLECYSTECTOMY    . CORONARY ARTERY BYPASS GRAFT N/A 04/18/2017   Procedure: CORONARY ARTERY BYPASS GRAFTING (CABG) times four using left internal mammary artery and right leg saphenous vein;  Surgeon: Kerin PernaVan Trigt, Peter, MD;  [facility];  Service: Open Heart Surgery;  Laterality: N/A;  . LEFT HEART CATH AND CORONARY ANGIOGRAPHY N/A 04/17/2017   Procedure: LEFT HEART CATH AND CORONARY ANGIOGRAPHY;  Surgeon: Marykay LexHarding, David W, MD;  [facility];  Service: Cardiovascular;  Laterality: N/A;  . ROTATOR CUFF REPAIR     left  . TEE WITHOUT CARDIOVERSION N/A 04/18/2017   Procedure: TRANSESOPHAGEAL ECHOCARDIOGRAM (TEE);  Surgeon: Donata ClayVan Trigt, Theron AristaPeter, MD;  [facility];  Service: Open Heart Surgery;  Laterality: N/A;    There were no vitals filed for this visit.  Subjective Assessment - 11/16/17 1624    Subjective   has been more compliant with HEP    Pertinent History  CABG x4, DM, HTN    Diagnostic tests  Xray: arthritis    Patient Stated Goals  improve pain and function of R UE    Currently in Pain?  Yes    Pain Score  2     Pain Location  Shoulder    Pain Orientation  Right    Pain Descriptors / Indicators  Aching;Tightness;Discomfort    Pain Type  Acute pain                       [facility] Adult PT Treatment/Exercise - 11/16/17 1625      Shoulder Exercises: Pulleys   Flexion  3 minutes    ABduction  3 minutes scaption      Shoulder Exercises: ROM/Strengthening   Ball on Wall  walking orange ball up wall x 10    Other ROM/Strengthening Exercises  pull down - 10# x 12 reps - emphasis on flexion stretch at top x 10 sec each      Shoulder Exercises: Stretch   Other Shoulder Stretches  R UE sleeper stretch -  2 x 60 sec      Manual Therapy   Manual Therapy  Passive ROM    Manual therapy comments  patient hooklying  Passive ROM  PROM all planes - improved motion and allowance of passive stretching               PT Short Term Goals - 10/24/17 1041      PT SHORT TERM GOAL #1   Title  patient to be independent with initial HEP    Status  Achieved      PT SHORT TERM GOAL #2   Title  patient ot demonstrate PROM R shoulder to WNL wihtout pain limiting    Status  On-going Still limited in all planes         PT Long Term Goals - 10/24/17 1054      PT LONG TERM GOAL #1   Title  patient to be independent with advanced HEP    Status  On-going      PT LONG TERM GOAL #2   Title  patient to demonstrate R shoulder AROM to WNL without pain limiting    Status  On-going still limited in all motions       PT LONG TERM GOAL #3   Title  patient to report ability to perform ADLs and household tasks without limitations at R shoulder    Status  On-going      PT LONG TERM GOAL #4   Title  patient to demosntrate R shoulder strength to >/= 4/5    Status  On-going             Plan - 11/16/17 1624    Clinical Impression Statement  Patient much more tolerable to PT session today - able to progress PROM well and reach an end feel that was appropriate, rather than heavy guadring. Patient education to continue to focus on general motion at R shoulder rather than strength training, currently. Patient also given sleeper stretch for continued improvements in motion in hte home environment.     PT Treatment/Interventions  ADLs/Self Care Home Management;Cryotherapy;Electrical Stimulation;Iontophoresis 4mg /ml Dexamethasone;Moist Heat;Therapeutic exercise;Therapeutic activities;Ultrasound;Neuromuscular re-education;Patient/family education;Manual techniques;Vasopneumatic Device;Taping;Dry needling;Passive range of motion    Consulted and Agree with Plan of Care  Patient       Patient will benefit from skilled therapeutic intervention in order to improve the following deficits and impairments:  Pain, Impaired UE functional use, Decreased strength, Decreased range of motion  Visit Diagnosis: Acute pain of right shoulder  Stiffness of right shoulder, not elsewhere classified  Abnormal posture  Other symptoms and signs involving the musculoskeletal system     Problem List Patient Active Problem List   Diagnosis Date Noted  . Pressure injury of skin 05/01/2017  . Coronary artery disease involving coronary bypass graft of native heart with angina pectoris (HCC) 04/30/2017  . SIRS (systemic inflammatory response syndrome) (HCC) 04/30/2017  . Symptomatic anemia 04/30/2017  . Hyperbilirubinemia 04/30/2017  . AKI (acute kidney injury) (HCC) 04/30/2017  . History of CVA in adulthood 04/30/2017  . Fever in adult   . Hypokalemia   . Jaundice   . Hx of CABG 04/18/2017  . Non-ST elevation (NSTEMI) myocardial infarction (HCC)   . Hyperlipidemia 03/14/2016  . Hypertensive heart disease 12/17/2015  . Diabetes mellitus, type II, insulin dependent (HCC) 01/04/2012   . Hypertension   . Stroke [facility])      Kipp Laurence, PT, DPT 11/16/17 6:16 PM   [facility] [address]  [address] [place], [place], [address] Phone: [phone]   Fax:  [phone]  Name: Andrea [name] MRN: [id number] Date of  Birth: 13-Jul-1946

## 2017-11-16 NOTE — Patient Instructions (Signed)
Posterior Capsule Sleeper Stretch, Side-Lying    Lie on side, pillow under head, neck in neutral, underside arm in 90-90 of shoulder and elbow flexion with scapula fixed to table. Use other hand to press back of underside arm forward and downward. Keep elbow angle. Hold _30__ seconds.  Repeat _3-5__ times per session. Do _1-2__ sessions per day.

## 2017-11-17 ENCOUNTER — Ambulatory Visit: Payer: Medicare Other | Admitting: Physical Therapy

## 2017-11-21 ENCOUNTER — Ambulatory Visit: Payer: Medicare Other

## 2017-11-21 DIAGNOSIS — M25511 Pain in right shoulder: Secondary | ICD-10-CM | POA: Diagnosis not present

## 2017-11-21 DIAGNOSIS — M25611 Stiffness of right shoulder, not elsewhere classified: Secondary | ICD-10-CM

## 2017-11-21 DIAGNOSIS — R29898 Other symptoms and signs involving the musculoskeletal system: Secondary | ICD-10-CM

## 2017-11-21 DIAGNOSIS — R293 Abnormal posture: Secondary | ICD-10-CM

## 2017-11-21 NOTE — Therapy (Addendum)
[facility] [place] [address]  [place], [place], [address] Phone: [phone]   Fax:  [phone]  Physical Therapy Treatment  Patient Details  Name: Andrea [name] MRN: [id number] Date of Birth: 1945-12-09 Referring Provider: Dr. Rhina Brackett   Encounter Date: 11/21/2017  PT End of Session - 11/21/17 1450    Visit Number  15    Number of Visits  22    Date for PT Re-Evaluation  12/05/17    Authorization Type  [employer]HC Medicare    PT Start Time  6295    PT Stop Time  1530    PT Time Calculation (min)  45 min    Activity Tolerance  Patient tolerated treatment well    Behavior During Therapy  [facility] for tasks assessed/performed       Past Medical History:  Diagnosis Date  . Anemia 04/30/2017  . CHF (congestive heart failure) ([place])   . Diabetes mellitus   . Hypertension   . Hypokalemia 04/2017  . Macular degeneration disease    loss of central vision on left and decreased peripheral vision.   . Stroke ([place]) 12/2011   no residuals noted    Past Surgical History:  Procedure Laterality Date  . CHOLECYSTECTOMY    . CORONARY ARTERY BYPASS GRAFT N/A 04/18/2017   Procedure: CORONARY ARTERY BYPASS GRAFTING (CABG) times four using left internal mammary artery and right leg saphenous vein;  Surgeon: Ivin Poot, MD;  Location: [place];  Service: Open Heart Surgery;  Laterality: N/A;  . LEFT HEART CATH AND CORONARY ANGIOGRAPHY N/A 04/17/2017   Procedure: LEFT HEART CATH AND CORONARY ANGIOGRAPHY;  Surgeon: Leonie Man, MD;  Location: [place] CV LAB;  Service: Cardiovascular;  Laterality: N/A;  . ROTATOR CUFF REPAIR     left  . TEE WITHOUT CARDIOVERSION N/A 04/18/2017   Procedure: TRANSESOPHAGEAL ECHOCARDIOGRAM (TEE);  Surgeon: Prescott Gum, Collier Salina, MD;  Location: [place];  Service: Open Heart Surgery;  Laterality: N/A;    There were no vitals filed for this visit.  Subjective Assessment - 11/21/17 1446    Subjective   Pt. reporting she has been performing HEP over weekend.      Pertinent History  CABG x4, DM, HTN    Diagnostic tests  Xray: arthritis    Patient Stated Goals  improve pain and function of R UE    Currently in Pain?  Yes    Pain Score  2     Pain Location  Shoulder    Pain Orientation  Right    Pain Descriptors / Indicators  Aching;Tightness;Discomfort    Pain Type  Acute pain    Pain Onset  More than a month ago    Pain Frequency  Intermittent    Aggravating Factors   Movement    Multiple Pain Sites  No                       [facility] Adult PT Treatment/Exercise - 11/21/17 1500      Shoulder Exercises: Seated   External Rotation  AAROM;10 reps    External Rotation Limitations  Manual guidance from therapist  into ER with stretch at end range     Flexion  AAROM;10 reps;Right    Flexion Limitations  Manual guidance from therapist with stretch at end range     Abduction  AAROM;10 reps;Right    ABduction Limitations  Manual guidance from therapist with stretch at end  range       Shoulder Exercises: Standing   Flexion  AAROM;Right;10 reps    Flexion Limitations  leaning into counter holding onto edge of sink with 10" stretch     ABduction  -- scaption     ABduction Limitations  --      Shoulder Exercises: Pulleys   Flexion  3 minutes    ABduction  3 minutes scaption       Shoulder Exercises: Stretch   External Rotation Stretch Limitations  R shoulder IR stretch with golf club behind back 5" x 10 resp       Manual Therapy   Manual Therapy  Passive ROM    Manual therapy comments  patient hooklying    Passive ROM  PROM all planes - Less muscular guarding following                PT Short Term Goals - 10/24/17 1041      PT SHORT TERM GOAL #1   Title  patient to be independent with initial HEP    Status  Achieved      PT SHORT TERM GOAL #2   Title  patient ot demonstrate PROM R shoulder to WNL wihtout pain limiting    Status  On-going Still limited in  all planes         PT Long Term Goals - 10/24/17 1054      PT LONG TERM GOAL #1   Title  patient to be independent with advanced HEP    Status  On-going      PT LONG TERM GOAL #2   Title  patient to demonstrate R shoulder AROM to WNL without pain limiting    Status  On-going still limited in all motions       PT LONG TERM GOAL #3   Title  patient to report ability to perform ADLs and household tasks without limitations at R shoulder    Status  On-going      PT LONG TERM GOAL #4   Title  patient to demosntrate R shoulder strength to >/= 4/5    Status  On-going            Plan - 11/21/17 1450    Clinical Impression Statement  Pt. reporting she has been performing HEP over weekend.  Reduced muscular guarding today with PROM activities with seemingly improved ROM.  Does still require frequent cueing with most activities in treatment to stay on task and perform as directed by therapist due to pt. tendency to talk and lose focus on activities.  Tolerated all ROM activities in treatment well today.  Will continue to progress toward goals.      PT Treatment/Interventions  ADLs/Self Care Home Management;Cryotherapy;Electrical Stimulation;Iontophoresis 21m/ml Dexamethasone;Moist Heat;Therapeutic exercise;Therapeutic activities;Ultrasound;Neuromuscular re-education;Patient/family education;Manual techniques;Vasopneumatic Device;Taping;Dry needling;Passive range of motion    Consulted and Agree with Plan of Care  Patient       Patient will benefit from skilled therapeutic intervention in order to improve the following deficits and impairments:  Pain, Impaired UE functional use, Decreased strength, Decreased range of motion  Visit Diagnosis: Acute pain of right shoulder  Stiffness of right shoulder, not elsewhere classified  Abnormal posture  Other symptoms and signs involving the musculoskeletal system     Problem List Patient Active Problem List   Diagnosis Date Noted  .  Pressure injury of skin 05/01/2017  . Coronary artery disease involving coronary bypass graft of native heart with [place]ina p[place]is [place] 04/30/2017  .  SIRS (systemic inflammatory response syndrome) ([place]) 04/30/2017  . Symptomatic anemia 04/30/2017  . Hyperbilirubinemia 04/30/2017  . AKI (acute kidney injury) ([place]) 04/30/2017  . History of CVA in adulthood 04/30/2017  . Fever in adult   . Hypokalemia   . Jaundice   . Hx of CABG 04/18/2017  . Non-ST elevation (NSTEMI) myocardial infarction ([place])   . Hyperlipidemia 03/14/2016  . Hypertensive heart disease 12/17/2015  . Diabetes mellitus, type II, insulin dependent ([place]) 01/04/2012  . Hypertension   . Stroke [facility])     Bess Harvest, PTA 11/21/17 6:09 PM   PHYSICAL THERAPY DISCHARGE SUMMARY  Visits from Start of Care: 15  Current functional level related to goals / functional outcomes: See above   Remaining deficits: See above - continued limitations in ROM and strength; some limited compliance limiting overall progression   Education / Equipment: HEP  Plan: Patient agrees to discharge.  Patient goals were not met. Patient is being discharged due to not returning since the last visit.  ?????patient stating awaiting for MRI before returning to PT. Will need new referral as she is past 30 days from last being seen.     Lanney Gins, PT, DPT 12/21/17 9:21 AM   [facility] [address]  [place], [place], [address] Phone: [phone]   Fax:  [phone]  Name: MESHELL ABDULAZIZ MRN: [id number] Date of Birth: 1946/06/25

## 2017-12-25 ENCOUNTER — Telehealth: Payer: Self-pay | Admitting: Interventional Cardiology

## 2017-12-25 NOTE — Telephone Encounter (Signed)
New Message:      Pt is calling and wanting to speak to Dr. Katrinka Blazing. Pt states she has been a pt of Katrinka Blazing for a long time and would like to see if he can start seeing her husband as well.

## 2017-12-25 NOTE — Telephone Encounter (Signed)
Spoke with pt and she told me about issues with husband's BP.  Wife also mentioned pt's HR got up to 148 today but has since came back down.  Advised wife if this happens again to have pt checked at ER.   Advised pt to contact PCP and get an appt for evaluation and have them refer to Dr. Katrinka Blazing if they feel cardiology is necessary.  Andrea [name] verbalized understanding and was appreciative for call.

## 2018-01-08 ENCOUNTER — Ambulatory Visit: Payer: Medicare Other | Attending: Family Medicine | Admitting: Physical Therapy

## 2018-01-08 ENCOUNTER — Other Ambulatory Visit: Payer: Self-pay

## 2018-01-08 ENCOUNTER — Encounter: Payer: Self-pay | Admitting: Physical Therapy

## 2018-01-08 DIAGNOSIS — R293 Abnormal posture: Secondary | ICD-10-CM | POA: Diagnosis present

## 2018-01-08 DIAGNOSIS — R29898 Other symptoms and signs involving the musculoskeletal system: Secondary | ICD-10-CM | POA: Diagnosis present

## 2018-01-08 DIAGNOSIS — M25511 Pain in right shoulder: Secondary | ICD-10-CM | POA: Diagnosis present

## 2018-01-08 DIAGNOSIS — M25611 Stiffness of right shoulder, not elsewhere classified: Secondary | ICD-10-CM | POA: Diagnosis present

## 2018-01-08 NOTE — Therapy (Signed)
[facility] [address]  [address] [place], [place], [address] Phone: [phone]   Fax:  [phone]  Physical Therapy Evaluation  Patient Details  Name: Andrea [name] MRN: [id number] Date of Birth: [birth year] Referring Provider: Eula Listen, MD   Encounter Date: 01/08/2018  PT End of Session - 01/08/18 1422    Visit Number  1    Number of Visits  17    Date for PT Re-Evaluation  03/05/18    Authorization Type  [employer] Medicare    PT Start Time  1320    PT Stop Time  1418    PT Time Calculation (min)  58 min    Activity Tolerance  Patient tolerated treatment well    Behavior During Therapy  [facility] for tasks assessed/performed       Past Medical History:  Diagnosis Date  . Anemia 04/30/2017  . CHF (congestive heart failure) (HCC)   . Diabetes mellitus   . Hypertension   . Hypokalemia 04/2017  . Macular degeneration disease    loss of central vision on left and decreased peripheral vision.   . Stroke (HCC) 12/2011   no residuals noted    Past Surgical History:  Procedure Laterality Date  . CHOLECYSTECTOMY    . CORONARY ARTERY BYPASS GRAFT N/A 04/18/2017   Procedure: CORONARY ARTERY BYPASS GRAFTING (CABG) times four using left internal mammary artery and right leg saphenous vein;  Surgeon: Kerin Perna, MD;  Location: [facility] OR;  Service: Open Heart Surgery;  Laterality: N/A;  . LEFT HEART CATH AND CORONARY ANGIOGRAPHY N/A 04/17/2017   Procedure: LEFT HEART CATH AND CORONARY ANGIOGRAPHY;  Surgeon: Marykay Lex, MD;  Location: [facility] INVASIVE CV LAB;  Service: Cardiovascular;  Laterality: N/A;  . ROTATOR CUFF REPAIR     left  . TEE WITHOUT CARDIOVERSION N/A 04/18/2017   Procedure: TRANSESOPHAGEAL ECHOCARDIOGRAM (TEE);  Surgeon: Donata Clay, Theron Arista, MD;  Location: [facility] OR;  Service: Open Heart Surgery;  Laterality: N/A;    There were no vitals filed for this visit.   Subjective Assessment - 01/08/18 1323    Subjective   Patient reports R shoulder pain that started in November 2018. Believes it started after her CABG x 4 in September 2018. Reports pain and stiffness. Reports it feels worse in the AM if she hasn't taken tylenol the night before. Checked glucose during appointment, reports it is 247 today.    Pertinent History  CABG x4, DM, HTN, stroke, L RTC repair    Limitations  Lifting;House hold activities    Diagnostic tests  per patient, xray on R shoulder    Patient Stated Goals  want to get my range of motion back without pain, being able to do my hair    Currently in Pain?  Yes    Pain Score  2     Pain Location  Shoulder    Pain Orientation  Right    Pain Descriptors / Indicators  Dull    Pain Type  Chronic pain    Pain Onset  More than a month ago    Aggravating Factors   reaching    Pain Relieving Factors  tylenol, hot showers         [facility] PT Assessment - 01/08/18 1337      Assessment   Medical Diagnosis  R rotator cuff syndrome, adhesive capsulitis, osteoarthritis     Referring Provider  Eula Listen, MD    Onset Date/Surgical Date  --  Nov 2018    Hand Dominance  Right    Next MD Visit  Sometime in July, not sure    Prior Therapy  Yes      Precautions   Precautions  None      Restrictions   Weight Bearing Restrictions  No      Balance Screen   Has the patient fallen in the past 6 months  No    Has the patient had a decrease in activity level because of a fear of falling?   No    Is the patient reluctant to leave their home because of a fear of falling?   No      Home Nurse, mental health  Private residence    Living Arrangements  Spouse/significant other;Children    Available Help at Discharge  Family intermittently able to help    Home Access  Stairs to enter    Entrance Stairs-Number of Steps  6    Entrance Stairs-Rails  Left    Home Layout  Two level    Alternate Level Stairs-Number of Steps  13    Alternate Level Stairs-Rails  Left    Home Equipment   [place] - single point      Prior Function   Level of Independence  Independent    Vocation  Retired    Leisure  driving      Cognition   Overall Cognitive Status  Within Functional Limits for tasks assessed      Observation/Other Assessments   Focus on Therapeutic Outcomes (FOTO)   Shoulder: 57 (43% limited, 39% predicted)      Sensation   Light Touch  Appears Intact      Coordination   Gross Motor Movements are Fluid and Coordinated  Yes      Posture/Postural Control   Posture/Postural Control  Postural limitations    Postural Limitations  Rounded Shoulders;Forward head;Weight shift right      ROM / Strength   AROM / PROM / Strength  AROM;PROM;Strength      AROM   AROM Assessment Site  Shoulder    Right/Left Shoulder  Right;Left    Right Shoulder Flexion  99 Degrees    Right Shoulder ABduction  60 Degrees    Right Shoulder Internal Rotation  48 Degrees    Right Shoulder External Rotation  5 Degrees    Left Shoulder Flexion  140 Degrees    Left Shoulder ABduction  180 Degrees    Left Shoulder Internal Rotation  75 Degrees    Left Shoulder External Rotation  68 Degrees      PROM   PROM Assessment Site  Shoulder    Right/Left Shoulder  Right;Left    Right Shoulder Flexion  119 Degrees    Right Shoulder ABduction  72 Degrees    Right Shoulder Internal Rotation  58 Degrees    Right Shoulder External Rotation  13 Degrees    Left Shoulder Extension  162 Degrees    Left Shoulder Flexion  168 Degrees    Left Shoulder ABduction  180 Degrees    Left Shoulder Internal Rotation  85 Degrees    Left Shoulder External Rotation  78 Degrees      Strength   Strength Assessment Site  Shoulder    Right/Left Shoulder  Right;Left    Right Shoulder Flexion  4/5    Right Shoulder ABduction  3+/5    Right Shoulder Internal Rotation  3+/5    Right Shoulder External  Rotation  3+/5    Left Shoulder Flexion  4+/5    Left Shoulder ABduction  3+/5    Left Shoulder Internal Rotation  3+/5     Left Shoulder External Rotation  3+/5      Palpation   Palpation comment  TTP in R anterior deltoid and biceps insertion      Transfers   Transfers  Supine to Sit    Supine to Sit  4: Min assist;4: Min guard difficulty with supine>sit, requiring assist      Ambulation/Gait   Gait Pattern  Step-through pattern;Antalgic                Objective measurements completed on examination: See above findings.              PT Education - 01/08/18 1416    Education Details  prognosis, POC, HEP    Person(s) Educated  Patient    Methods  Demonstration;Explanation;Tactile cues;Verbal cues;Handout    Comprehension  Verbalized understanding;Returned demonstration       PT Short Term Goals - 01/08/18 1430      PT SHORT TERM GOAL #1   Title  patient to be independent with initial HEP    Time  4    Period  Weeks    Status  New    Target Date  02/05/18        PT Long Term Goals - 01/08/18 1430      PT LONG TERM GOAL #1   Title  patient to be independent with advanced HEP    Time  8    Period  Weeks    Status  New    Target Date  03/05/18      PT LONG TERM GOAL #2   Title  patient to demonstrate R shoulder AROM to WNL without pain limiting    Time  8    Period  Weeks    Status  New    Target Date  03/05/18      PT LONG TERM GOAL #3   Title  patient to report ability to reach top of head with R UE in order to allow for hair dressing.    Time  8    Period  Weeks    Status  New    Target Date  03/05/18      PT LONG TERM GOAL #4   Title  patient to demonstrate R shoulder strength to >/= 4/5    Time  8    Period  Weeks    Status  New    Target Date  03/05/18             Plan - 01/08/18 1424    Clinical Impression Statement  Patient is a 71y/o F presenting to OPPT with report of R shoulder pain, onset in November 2018. Reports she has significant pain and stiffness, worse in the AM. Has difficulty reaching overhead into flexion and unable to  reach head to do her hair. Presents with the following impairments: decreased ROM, decreased strength, pain, postural impairments, decreased functional activity tolerance. Would benefit from skilled PT services 2x/week for 8 weeks to address aforementioned impairments. Patient educated on and received HEP handout. Advised to push exercises to the point of stretching but avoid pushing past pain threshold. Patient reported understanding.     History and Personal Factors relevant to plan of care:  DM, past PT experience    Clinical Presentation  Stable    Clinical  Decision Making  Low    Rehab Potential  Good    PT Frequency  2x / week    PT Duration  8 weeks    PT Treatment/Interventions  ADLs/Self Care Home Management;Cryotherapy;Electrical Stimulation;Iontophoresis 4mg /ml Dexamethasone;Moist Heat;Therapeutic exercise;Therapeutic activities;Ultrasound;Neuromuscular re-education;Patient/family education;Manual techniques;Vasopneumatic Device;Taping;Dry needling;Passive range of motion;Energy conservation;Splinting    PT Next Visit Plan  reassess HEP, joint mobs and STM    Consulted and Agree with Plan of Care  Patient       Patient will benefit from skilled therapeutic intervention in order to improve the following deficits and impairments:  Hypomobility, Decreased activity tolerance, Decreased strength, Impaired UE functional use, Pain, Decreased mobility, Decreased range of motion, Postural dysfunction, Impaired flexibility  Visit Diagnosis: Acute pain of right shoulder  Stiffness of right shoulder, not elsewhere classified  Abnormal posture  Other symptoms and signs involving the musculoskeletal system     Problem List Patient Active Problem List   Diagnosis Date Noted  . Pressure injury of skin 05/01/2017  . Coronary artery disease involving coronary bypass graft of native heart with angina pectoris (HCC) 04/30/2017  . SIRS (systemic inflammatory response syndrome) (HCC) 04/30/2017   . Symptomatic anemia 04/30/2017  . Hyperbilirubinemia 04/30/2017  . AKI (acute kidney injury) (HCC) 04/30/2017  . History of CVA in adulthood 04/30/2017  . Fever in adult   . Hypokalemia   . Jaundice   . Hx of CABG 04/18/2017  . Non-ST elevation (NSTEMI) myocardial infarction (HCC)   . Hyperlipidemia 03/14/2016  . Hypertensive heart disease 12/17/2015  . Diabetes mellitus, type II, insulin dependent (HCC) 01/04/2012  . Hypertension   . Stroke [facility](HCC)     Anette GuarneriYevgeniya Harjit [name], PT, DPT 01/08/18 2:36 PM   [facility] [place] [address]  [address] [place], [place], [address] Phone: [phone]   Fax:  [phone]  Name: Andrea [name] MRN: [id number] Date of Birth: 1945-12-15

## 2018-01-10 ENCOUNTER — Encounter: Payer: Self-pay | Admitting: Physical Therapy

## 2018-01-10 ENCOUNTER — Ambulatory Visit: Payer: Medicare Other | Admitting: Physical Therapy

## 2018-01-10 DIAGNOSIS — R29898 Other symptoms and signs involving the musculoskeletal system: Secondary | ICD-10-CM

## 2018-01-10 DIAGNOSIS — M25511 Pain in right shoulder: Secondary | ICD-10-CM

## 2018-01-10 DIAGNOSIS — R293 Abnormal posture: Secondary | ICD-10-CM

## 2018-01-10 DIAGNOSIS — M25611 Stiffness of right shoulder, not elsewhere classified: Secondary | ICD-10-CM

## 2018-01-10 NOTE — Therapy (Signed)
[facility] [place] [address]  [address] [place], [place], [address] Phone: [phone]   Fax:  [phone]  Physical Therapy Treatment  Patient Details  Name: Andrea Mandrilamela I Donahoo MRN: [id number] Date of Birth: 12/15/1945 Referring Provider: Eula Listenominic McKinley, MD   Encounter Date: 01/10/2018  PT End of Session - 01/10/18 1503    Visit Number  2    Number of Visits  17    Date for PT Re-Evaluation  03/05/18    Authorization Type  [employer]HC Medicare    PT Start Time  1413    PT Stop Time  1448    PT Time Calculation (min)  35 min    Activity Tolerance  Patient tolerated treatment well    Behavior During Therapy  [facility] for tasks assessed/performed       Past Medical History:  Diagnosis Date  . Anemia 04/30/2017  . CHF (congestive heart failure) (HCC)   . Diabetes mellitus   . Hypertension   . Hypokalemia 04/2017  . Macular degeneration disease    loss of central vision on left and decreased peripheral vision.   . Stroke (HCC) 12/2011   no residuals noted    Past Surgical History:  Procedure Laterality Date  . CHOLECYSTECTOMY    . CORONARY ARTERY BYPASS GRAFT N/A 04/18/2017   Procedure: CORONARY ARTERY BYPASS GRAFTING (CABG) times four using left internal mammary artery and right leg saphenous vein;  Surgeon: Kerin PernaVan Trigt, Peter, MD;  [facility];  Service: Open Heart Surgery;  Laterality: N/A;  . LEFT HEART CATH AND CORONARY ANGIOGRAPHY N/A 04/17/2017   Procedure: LEFT HEART CATH AND CORONARY ANGIOGRAPHY;  Surgeon: Marykay LexHarding, David W, MD;  [facility];  Service: Cardiovascular;  Laterality: N/A;  . ROTATOR CUFF REPAIR     left  . TEE WITHOUT CARDIOVERSION N/A 04/18/2017   Procedure: TRANSESOPHAGEAL ECHOCARDIOGRAM (TEE);  Surgeon: Donata ClayVan Trigt, Theron AristaPeter, MD;  [facility];  Service: Open Heart Surgery;  Laterality: N/A;    There were no vitals filed for this visit.  Subjective Assessment - 01/10/18 1414    Subjective   Reports she was practicing her exercises yesterday. Took blood glucose before session- was 157.     Pertinent History  CABG x4, DM, HTN, stroke, L RTC repair    Diagnostic tests  per patient, xray on R shoulder    Patient Stated Goals  want to get my range of motion back without pain, being able to do my hair    Currently in Pain?  Yes    Pain Score  3     Pain Location  Shoulder    Pain Orientation  Right    Pain Descriptors / Indicators  Sharp;Other (Comment) pinching                       [facility] Adult PT Treatment/Exercise - 01/10/18 0001      Shoulder Exercises: Supine   External Rotation  AAROM;Strengthening;Both;5 reps VCs to keep elbows at sides    Flexion  AAROM;Strengthening;Both;5 reps    ABduction  AAROM;Strengthening;Both;10 reps VC/TCs required for form and to follow coronal plane      Shoulder Exercises: Seated   Retraction  Strengthening;Both;15 reps 3 sec hold      Shoulder Exercises: Standing   External Rotation  Right;Strengthening;10 reps dowel under elbow      Shoulder Exercises: Pulleys   Flexion  3 minutes    ABduction  3 minutes      Shoulder Exercises: ROM/Strengthening   Other ROM/Strengthening Exercises  sitting at table rolling pball forward into flexion; 10x      Manual Therapy   Manual Therapy  Passive ROM;Joint mobilization;Soft tissue mobilization    Manual therapy comments  patient hooklying    Joint Mobilization  R shoulder mobs- inferior and posterior grade III    Soft tissue mobilization  infraspinatus, levator scap- TTP in these areas    Passive ROM  PROM all planes - severe muscular guarding following                PT Short Term Goals - 01/08/18 1430      PT SHORT TERM GOAL #1   Title  patient to be independent with initial HEP    Time  4    Period  Weeks    Status  New    Target Date  02/05/18        PT Long Term Goals - 01/08/18 1430      PT LONG TERM GOAL #1   Title  patient to be independent with  advanced HEP    Time  8    Period  Weeks    Status  New    Target Date  03/05/18      PT LONG TERM GOAL #2   Title  patient to demonstrate R shoulder AROM to WNL without pain limiting    Time  8    Period  Weeks    Status  New    Target Date  03/05/18      PT LONG TERM GOAL #3   Title  patient to report ability to reach top of head with R UE in order to allow for hair dressing.    Time  8    Period  Weeks    Status  New    Target Date  03/05/18      PT LONG TERM GOAL #4   Title  patient to demonstrate R shoulder strength to >/= 4/5    Time  8    Period  Weeks    Status  New    Target Date  03/05/18            Plan - 01/10/18 1503    Clinical Impression Statement  Patient arrived late to appointment- reports she took a nap and overslept. Reports compliance with HEP; reports some difficulty with one of the exercises. Tolerated PROM to R shoulder in all planes and R shoulder joint mobs in inferior and posterior directions- patient with muscle guarding and difficulty relaxing. Tolerated STM to R infraspinatus and levator scapulae- TTP in these areas. Performed supine AAROM into flexion, abduction, ER. Increased VC/TCs required for form. Clarified patient's questions on this exercise. Ended session without increased c/o pain.    PT Treatment/Interventions  ADLs/Self Care Home Management;Cryotherapy;Electrical Stimulation;Iontophoresis 4mg /ml Dexamethasone;Moist Heat;Therapeutic exercise;Therapeutic activities;Ultrasound;Neuromuscular re-education;Patient/family education;Manual techniques;Vasopneumatic Device;Taping;Dry needling;Passive range of motion;Energy conservation;Splinting    PT Next Visit Plan  AAROM/AROM, STM and jt mobs    Consulted and Agree with Plan of Care  Patient       Patient will benefit from skilled therapeutic intervention in order to improve the following deficits and impairments:  Hypomobility, Decreased activity tolerance, Decreased strength, Impaired UE  functional use, Pain, Decreased mobility, Decreased range of motion, Postural dysfunction, Impaired flexibility  Visit Diagnosis: Acute pain of right shoulder  Stiffness of right shoulder, not elsewhere classified  Abnormal posture  Other symptoms and signs involving the musculoskeletal system     Problem List Patient Active Problem List   Diagnosis Date Noted  . Pressure injury of skin 05/01/2017  . Coronary artery disease involving coronary bypass graft of native heart with angina pectoris (HCC) 04/30/2017  . SIRS (systemic inflammatory response syndrome) (HCC) 04/30/2017  . Symptomatic anemia 04/30/2017  . Hyperbilirubinemia 04/30/2017  . AKI (acute kidney injury) (HCC) 04/30/2017  . History of CVA in adulthood 04/30/2017  . Fever in adult   . Hypokalemia   . Jaundice   . Hx of CABG 04/18/2017  . Non-ST elevation (NSTEMI) myocardial infarction (HCC)   . Hyperlipidemia 03/14/2016  . Hypertensive heart disease 12/17/2015  . Diabetes mellitus, type II, insulin dependent (HCC) 01/04/2012  . Hypertension   . Stroke [facility])     Anette Guarneri, PT, DPT 01/10/18 3:05 PM   [facility] [address]  [address] [place], [place], [address] Phone: [phone]   Fax:  [phone]  Name: Andrea [name] MRN: [id number] Date of Birth: July 14, 1946

## 2018-01-16 ENCOUNTER — Ambulatory Visit: Payer: Medicare Other

## 2018-01-16 DIAGNOSIS — M25511 Pain in right shoulder: Secondary | ICD-10-CM

## 2018-01-16 DIAGNOSIS — R29898 Other symptoms and signs involving the musculoskeletal system: Secondary | ICD-10-CM

## 2018-01-16 DIAGNOSIS — R293 Abnormal posture: Secondary | ICD-10-CM

## 2018-01-16 DIAGNOSIS — M25611 Stiffness of right shoulder, not elsewhere classified: Secondary | ICD-10-CM

## 2018-01-16 NOTE — Therapy (Signed)
[facility] Outpatient Rehabilitation [facility] [address]  [address] [place], [place], [address] Phone: [phone]   Fax:  [phone]  Physical Therapy Treatment  Patient Details  Name: Andrea [name] MRN: [id number] Date of Birth: 1946-04-14 Referring Provider: Eula Listen, MD   Encounter Date: 01/16/2018  PT End of Session - 01/16/18 1617    Visit Number  3    Number of Visits  17    Date for PT Re-Evaluation  03/05/18    Authorization Type  [employer] Medicare    PT Start Time  1615    PT Stop Time  1700    PT Time Calculation (min)  45 min    Activity Tolerance  Patient tolerated treatment well    Behavior During Therapy  [facility] for tasks assessed/performed       Past Medical History:  Diagnosis Date  . Anemia 04/30/2017  . CHF (congestive heart failure) (HCC)   . Diabetes mellitus   . Hypertension   . Hypokalemia 04/2017  . Macular degeneration disease    loss of central vision on left and decreased peripheral vision.   . Stroke (HCC) 12/2011   no residuals noted    Past Surgical History:  Procedure Laterality Date  . CHOLECYSTECTOMY    . CORONARY ARTERY BYPASS GRAFT N/A 04/18/2017   Procedure: CORONARY ARTERY BYPASS GRAFTING (CABG) times four using left internal mammary artery and right leg saphenous vein;  Surgeon: Kerin Perna, MD;  Location: [facility] OR;  Service: Open Heart Surgery;  Laterality: N/A;  . LEFT HEART CATH AND CORONARY ANGIOGRAPHY N/A 04/17/2017   Procedure: LEFT HEART CATH AND CORONARY ANGIOGRAPHY;  Surgeon: Marykay Lex, MD;  Location: [facility] INVASIVE CV LAB;  Service: Cardiovascular;  Laterality: N/A;  . ROTATOR CUFF REPAIR     left  . TEE WITHOUT CARDIOVERSION N/A 04/18/2017   Procedure: TRANSESOPHAGEAL ECHOCARDIOGRAM (TEE);  Surgeon: Donata Clay, Theron Arista, MD;  Location: [facility] OR;  Service: Open Heart Surgery;  Laterality: N/A;    There were no vitals filed for this visit.  Subjective Assessment - 01/16/18 1633    Subjective   Pt. noting she will be out of town tuesday through Thursday next week.      Pertinent History  CABG x4, DM, HTN, stroke, L RTC repair    Diagnostic tests  per patient, xray on R shoulder    Patient Stated Goals  want to get my range of motion back without pain, being able to do my hair    Currently in Pain?  Yes    Pain Score  0-No pain 4/10 at worst     Pain Location  Shoulder    Pain Orientation  Right    Pain Descriptors / Indicators  Sharp    Pain Type  Chronic pain    Pain Onset  More than a month ago    Pain Frequency  Intermittent    Multiple Pain Sites  No                       [facility] Adult PT Treatment/Exercise - 01/16/18 1820      Shoulder Exercises: Seated   External Rotation  AAROM;10 reps 5" hold - therapist guidance     External Rotation Limitations  wand     Flexion  AAROM;10 reps;Right 5" stretch with therapist overpressure     Flexion Limitations  leaning on red p-ball       Shoulder Exercises: Standing  Other Standing Exercises  Standing R flexion wall slide with therapist guidance and leaning stomach on small p-ball to prevent "leaning back" 5" x 10 reps       Shoulder Exercises: Pulleys   Flexion  3 minutes Cues for appropriate stretch     ABduction  3 minutes Cues for appropriate stretch       Shoulder Exercises: ROM/Strengthening   UBE (Upper Arm Bike)  Lvl 1.0, 2 min each way     Other ROM/Strengthening Exercises  BATCA pulldown 5" x 10 reps 15 #       Manual Therapy   Manual Therapy  Passive ROM;Joint mobilization    Manual therapy comments  patient hooklying    Joint Mobilization  R shoulder mobs- inferior and posterior grade III    Passive ROM  PROM all planes - severe muscular guarding following                PT Short Term Goals - 01/16/18 1618      PT SHORT TERM GOAL #1   Title  patient to be independent with initial HEP    Time  4    Period  Weeks    Status  On-going        PT Long Term Goals - 01/16/18 1618       PT LONG TERM GOAL #1   Title  patient to be independent with advanced HEP    Time  8    Period  Weeks    Status  On-going      PT LONG TERM GOAL #2   Title  patient to demonstrate R shoulder AROM to WNL without pain limiting    Time  8    Period  Weeks    Status  On-going      PT LONG TERM GOAL #3   Title  patient to report ability to reach top of head with R UE in order to allow for hair dressing.    Time  8    Period  Weeks    Status  On-going      PT LONG TERM GOAL #4   Title  patient to demonstrate R shoulder strength to >/= 4/5    Time  8    Period  Weeks    Status  On-going            Plan - 01/16/18 1619    Clinical Impression Statement  [name] tolerated all PROM and AAROM activities well today in session with cues required for appropriate stretch in session today.  Does have tendency for substitutions throughout therex requiring consistent cueing to correct.  Ended session pain free with HEP updated.    PT Treatment/Interventions  ADLs/Self Care Home Management;Cryotherapy;Electrical Stimulation;Iontophoresis 4mg /ml Dexamethasone;Moist Heat;Therapeutic exercise;Therapeutic activities;Ultrasound;Neuromuscular re-education;Patient/family education;Manual techniques;Vasopneumatic Device;Taping;Dry needling;Passive range of motion;Energy conservation;Splinting    PT Next Visit Plan  AAROM/AROM, STM and jt mobs    Consulted and Agree with Plan of Care  Patient       Patient will benefit from skilled therapeutic intervention in order to improve the following deficits and impairments:  Hypomobility, Decreased activity tolerance, Decreased strength, Impaired UE functional use, Pain, Decreased mobility, Decreased range of motion, Postural dysfunction, Impaired flexibility  Visit Diagnosis: Acute pain of right shoulder  Stiffness of right shoulder, not elsewhere classified  Abnormal posture  Other symptoms and signs involving the musculoskeletal  system     Problem List Patient Active Problem List   Diagnosis Date Noted  .  Pressure injury of skin 05/01/2017  . Coronary artery disease involving coronary bypass graft of native heart with angina pectoris (HCC) 04/30/2017  . SIRS (systemic inflammatory response syndrome) (HCC) 04/30/2017  . Symptomatic anemia 04/30/2017  . Hyperbilirubinemia 04/30/2017  . AKI (acute kidney injury) (HCC) 04/30/2017  . History of CVA in adulthood 04/30/2017  . Fever in adult   . Hypokalemia   . Jaundice   . Hx of CABG 04/18/2017  . Non-ST elevation (NSTEMI) myocardial infarction (HCC)   . Hyperlipidemia 03/14/2016  . Hypertensive heart disease 12/17/2015  . Diabetes mellitus, type II, insulin dependent (HCC) 01/04/2012  . Hypertension   . Stroke [facility])     Kermit Balo, [place] 01/16/18 6:29 PM  [facility] [address]  [address] [place], [place], [address] Phone: [phone]   Fax:  [phone]  Name: CORNISHA ZETINO MRN: [id number] Date of Birth: [birth year]

## 2018-01-18 ENCOUNTER — Encounter: Payer: Self-pay | Admitting: Physical Therapy

## 2018-01-18 ENCOUNTER — Ambulatory Visit: Payer: Medicare Other | Admitting: Physical Therapy

## 2018-01-18 DIAGNOSIS — R293 Abnormal posture: Secondary | ICD-10-CM

## 2018-01-18 DIAGNOSIS — R29898 Other symptoms and signs involving the musculoskeletal system: Secondary | ICD-10-CM

## 2018-01-18 DIAGNOSIS — M25611 Stiffness of right shoulder, not elsewhere classified: Secondary | ICD-10-CM

## 2018-01-18 DIAGNOSIS — M25511 Pain in right shoulder: Secondary | ICD-10-CM

## 2018-01-18 NOTE — Therapy (Signed)
[facility] [address]  [address] [place], [place], [address] Phone: [phone]   Fax:  [phone]  Physical Therapy Treatment  Patient Details  Name: Andrea [name] I Kallenberger MRN: [id number] Date of Birth: [birth year] Referring Provider: Eula Listenominic McKinley, MD   Encounter Date: 01/18/2018  PT End of Session - 01/18/18 1705    Visit Number  4    Number of Visits  17    Date for PT Re-Evaluation  03/05/18    Authorization Type  [employer]HC Medicare    PT Start Time  1614    PT Stop Time  1703    PT Time Calculation (min)  49 min    Activity Tolerance  Patient tolerated treatment well    Behavior During Therapy  [facility] for tasks assessed/performed       Past Medical History:  Diagnosis Date  . Anemia 04/30/2017  . CHF (congestive heart failure) (HCC)   . Diabetes mellitus   . Hypertension   . Hypokalemia 04/2017  . Macular degeneration disease    loss of central vision on left and decreased peripheral vision.   . Stroke (HCC) 12/2011   no residuals noted    Past Surgical History:  Procedure Laterality Date  . CHOLECYSTECTOMY    . CORONARY ARTERY BYPASS GRAFT N/A 04/18/2017   Procedure: CORONARY ARTERY BYPASS GRAFTING (CABG) times four using left internal mammary artery and right leg saphenous vein;  Surgeon: Kerin PernaVan Trigt, Peter, MD;  [facility];  Service: Open Heart Surgery;  Laterality: N/A;  . LEFT HEART CATH AND CORONARY ANGIOGRAPHY N/A 04/17/2017   Procedure: LEFT HEART CATH AND CORONARY ANGIOGRAPHY;  Surgeon: Marykay LexHarding, David W, MD;  [facility];  Service: Cardiovascular;  Laterality: N/A;  . ROTATOR CUFF REPAIR     left  . TEE WITHOUT CARDIOVERSION N/A 04/18/2017   Procedure: TRANSESOPHAGEAL ECHOCARDIOGRAM (TEE);  Surgeon: Donata ClayVan Trigt, Theron AristaPeter, MD;  [facility];  Service: Open Heart Surgery;  Laterality: N/A;    There were no vitals filed for this visit.  Subjective Assessment - 01/18/18 1611    Subjective   Patient reports that she has increased pain after last session and not able to perform her exercises. Checked blood glucose before session- was 157.    Pertinent History  CABG x4, DM, HTN, stroke, L RTC repair    Diagnostic tests  per patient, xray on R shoulder    Patient Stated Goals  want to get my range of motion back without pain, being able to do my hair    Currently in Pain?  Yes    Pain Score  8     Pain Location  Shoulder    Pain Orientation  Right    Pain Descriptors / Indicators  Dull                       [facility] Adult PT Treatment/Exercise - 01/18/18 0001      Shoulder Exercises: Supine   Protraction  Strengthening;Both;15 reps serratus punches with cane; VCs to straighten elbows    External Rotation  AAROM;Strengthening;Both;10 reps;Limitations    External Rotation Limitations  with cane    Flexion  AAROM;Strengthening;Both;10 reps;Limitations    Flexion Limitations  with cane    ABduction  AAROM;Strengthening;Both;10 reps;Limitations    ABduction Limitations  with cane      Shoulder Exercises: Sidelying   External Rotation  Strengthening;Right;10 reps;Weights;Limitations    External Rotation Weight (lbs)  1#    External Rotation Limitations  2x10       Shoulder Exercises: Standing   Retraction  Strengthening;Both;15 reps;Theraband    Theraband Level (Shoulder Retraction)  Level 2 (Red) VCs to avoid hiking shoulders    Other Standing Exercises  Standing horizontal ABD B UE; yellow TB; 15x    Other Standing Exercises  Standing B UE ER; yellow TB; 15x      Shoulder Exercises: Pulleys   Flexion  3 minutes    ABduction  3 minutes      Shoulder Exercises: ROM/Strengthening   Other ROM/Strengthening Exercises  sitting at table rolling pball forward into flexion and abduction; 10x each      Manual Therapy   Manual Therapy  Passive ROM;Joint mobilization    Manual therapy comments  patient hooklying    Joint Mobilization  R shoulder mobs- inferior,  posterior, long arm distraction grade III/IV    Passive ROM  PROM all planes to tolerance             PT Education - 01/18/18 1705    Education Details  addition to HEP    Person(s) Educated  Patient    Methods  Explanation;Demonstration;Tactile cues;Verbal cues;Handout    Comprehension  Verbalized understanding;Returned demonstration       PT Short Term Goals - 01/18/18 1709      PT SHORT TERM GOAL #1   Title  patient to be independent with initial HEP    Time  4    Period  Weeks    Status  Achieved        PT Long Term Goals - 01/16/18 1618      PT LONG TERM GOAL #1   Title  patient to be independent with advanced HEP    Time  8    Period  Weeks    Status  On-going      PT LONG TERM GOAL #2   Title  patient to demonstrate R shoulder AROM to WNL without pain limiting    Time  8    Period  Weeks    Status  On-going      PT LONG TERM GOAL #3   Title  patient to report ability to reach top of head with R UE in order to allow for hair dressing.    Time  8    Period  Weeks    Status  On-going      PT LONG TERM GOAL #4   Title  patient to demonstrate R shoulder strength to >/= 4/5    Time  8    Period  Weeks    Status  On-going            Plan - 01/18/18 1706    Clinical Impression Statement  Patient arrived with report that she had increased R shoulder pain after last session. Patient tolerated R shoulder PROM in all planes and joint mobilizations in posterior and inferior directions, and long arm distraction to tolerance without c/o pain. Patient tolerated R shoulder AAROM with wand with VCs to push to ROM limit. Demonstrated supine serratus punches with wand- VCs to keep elbows straight. Added this exercises to HEP per patient's request. Tolerated gentle RTC and periscapular strengthening with band resistance; VCs to correct form. Patient with report that she noticed improvement in R shoulder AROM at end of session.     PT Treatment/Interventions   ADLs/Self Care Home Management;Cryotherapy;Electrical Stimulation;Iontophoresis 4mg /ml Dexamethasone;Moist Heat;Therapeutic exercise;Therapeutic activities;Ultrasound;Neuromuscular re-education;Patient/family education;Manual techniques;Vasopneumatic  Device;Taping;Dry needling;Passive range of motion;Energy conservation;Splinting    PT Next Visit Plan  progress RTC and periscapular strengthening     Consulted and Agree with Plan of Care  Patient       Patient will benefit from skilled therapeutic intervention in order to improve the following deficits and impairments:  Hypomobility, Decreased activity tolerance, Decreased strength, Impaired UE functional use, Pain, Decreased mobility, Decreased range of motion, Postural dysfunction, Impaired flexibility  Visit Diagnosis: Acute pain of right shoulder  Stiffness of right shoulder, not elsewhere classified  Abnormal posture  Other symptoms and signs involving the musculoskeletal system     Problem List Patient Active Problem List   Diagnosis Date Noted  . Pressure injury of skin 05/01/2017  . Coronary artery disease involving coronary bypass graft of native heart with angina pectoris (HCC) 04/30/2017  . SIRS (systemic inflammatory response syndrome) (HCC) 04/30/2017  . Symptomatic anemia 04/30/2017  . Hyperbilirubinemia 04/30/2017  . AKI (acute kidney injury) (HCC) 04/30/2017  . History of CVA in adulthood 04/30/2017  . Fever in adult   . Hypokalemia   . Jaundice   . Hx of CABG 04/18/2017  . Non-ST elevation (NSTEMI) myocardial infarction (HCC)   . Hyperlipidemia 03/14/2016  . Hypertensive heart disease 12/17/2015  . Diabetes mellitus, type II, insulin dependent (HCC) 01/04/2012  . Hypertension   . Stroke [facility])     Anette Guarneri, PT, DPT 01/18/18 5:11 PM   [facility] [address]  [address] [place], [place], [address] Phone: [phone]   Fax:   [phone]  Name: EMOJEAN GERTZ MRN: [id number] Date of Birth: [birth year]

## 2018-01-21 ENCOUNTER — Other Ambulatory Visit: Payer: Self-pay | Admitting: Neurology

## 2018-01-22 ENCOUNTER — Ambulatory Visit: Payer: Medicare Other

## 2018-01-22 ENCOUNTER — Ambulatory Visit: Payer: Medicare Other | Admitting: Neurology

## 2018-01-23 ENCOUNTER — Ambulatory Visit: Payer: Medicare Other | Admitting: Physical Therapy

## 2018-01-23 ENCOUNTER — Telehealth: Payer: Self-pay | Admitting: Interventional Cardiology

## 2018-01-23 NOTE — Telephone Encounter (Signed)
New Message   Patient called she is on her way to the beach , is it ok for her to go without her medication for a few days , she left it at home?  Please advise

## 2018-01-23 NOTE — Telephone Encounter (Signed)
Spoke with pt and let her know it is not ideal for her to miss doses of her medication.  Advised she can stop at a pharmacy when she gets to the beach and have them contact her local pharmacy to send prescriptions over so she can get a couple of days worth of pills.  Pt verbalized understanding and was appreciative for call.

## 2018-01-25 ENCOUNTER — Ambulatory Visit: Payer: Medicare Other | Admitting: Physical Therapy

## 2018-01-28 NOTE — Progress Notes (Signed)
Cardiology Office Note    Date:  01/29/2018   ID:  Andrea [name], DOB 06/05/1946, MRN [id number]  PCP:  Renford Dills, MD  Cardiologist: Lesleigh Noe, MD   Chief Complaint  Patient presents with  . Coronary Artery Disease    History of Present Illness:  Andrea [name] is a 72 y.o. female with recent coronary bypass grafting (LIMA to LAD, SVG to diagonal, SVG to OM) September 2018 after presenting with non-ST elevation myocardial infarction, diabetes mellitus type 2, hypertension, and prior history of CVA. Acute on chronic right cerebral white matter infarct-with advanced small vessel disease. Left facial droop, 2013)   Discomfort in the right arm.  This is approximately the same intensity to slightly better.  Etiology is uncertain but could be related to brachial plexus injury at the time of surgery or diabetes mononeuropathy.  No anginal quality chest discomfort since revascularization in September 2018.  Denies palpitations and syncope.  No medication side effect.  Not on statin therapy.   Past Medical History:  Diagnosis Date  . Anemia 04/30/2017  . CHF (congestive heart failure) (HCC)   . Diabetes mellitus   . Hypertension   . Hypokalemia 04/2017  . Macular degeneration disease    loss of central vision on left and decreased peripheral vision.   . Stroke (HCC) 12/2011   no residuals noted    Past Surgical History:  Procedure Laterality Date  . CHOLECYSTECTOMY    . CORONARY ARTERY BYPASS GRAFT N/A 04/18/2017   Procedure: CORONARY ARTERY BYPASS GRAFTING (CABG) times four using left internal mammary artery and right leg saphenous vein;  Surgeon: Kerin Perna, MD;  Location: [facility] OR;  Service: Open Heart Surgery;  Laterality: N/A;  . LEFT HEART CATH AND CORONARY ANGIOGRAPHY N/A 04/17/2017   Procedure: LEFT HEART CATH AND CORONARY ANGIOGRAPHY;  Surgeon: Marykay Lex, MD;  Location: [facility] INVASIVE CV LAB;  Service: Cardiovascular;  Laterality: N/A;  . ROTATOR CUFF  REPAIR     left  . TEE WITHOUT CARDIOVERSION N/A 04/18/2017   Procedure: TRANSESOPHAGEAL ECHOCARDIOGRAM (TEE);  Surgeon: Donata Clay, Theron Arista, MD;  Location: [facility] OR;  Service: Open Heart Surgery;  Laterality: N/A;    Current Medications: Outpatient Medications Prior to Visit  Medication Sig Dispense Refill  . allopurinol (ZYLOPRIM) 100 MG tablet Take 100 mg by mouth daily.  1  . carvedilol (COREG) 6.25 MG tablet Take 1 tablet (6.25 mg total) by mouth 2 (two) times daily with a meal. 60 tablet 1  . Cholecalciferol (VITAMIN D) 2000 UNITS tablet Take 2,000 Units by mouth daily.    Marland Kitchen HUMALOG KWIKPEN 100 UNIT/ML KiwkPen Inject 2-15 Units into the skin 3 (three) times daily before meals. (Per sliding scale)  3  . insulin detemir (LEVEMIR) 100 UNIT/ML injection Inject 30 Units into the skin at bedtime. Sliding Scale    . Olmesartan-amLODIPine-HCTZ 40-10-25 MG TABS Take 1 tablet by mouth daily.  3  . ONETOUCH VERIO test strip USE TO TEST BLOOD SUGAR 4 TIMES DAILY  12  . tiZANidine (ZANAFLEX) 2 MG tablet TAKE 1 TABLET (2 MG TOTAL) BY MOUTH AT BEDTIME. 90 tablet 0  . VESICARE 10 MG tablet Take 10 mg by mouth daily.  5  . aspirin EC 325 MG EC tablet Take 1 tablet (325 mg total) by mouth daily. 30 tablet 0   No facility-administered medications prior to visit.      Allergies:   Patient has no known allergies.   Social  History   Socioeconomic History  . Marital status: Married    Spouse name: Not on file  . Number of children: 3  . Years of education: 7116  . Highest education level: Not on file  Occupational History  . Occupation: Professor    Associate Professormployer: [employer]  Social Needs  . Financial resource strain: Not on file  . Food insecurity:    Worry: Not on file    Inability: Not on file  . Transportation needs:    Medical: Not on file    Non-medical: Not on file  Tobacco Use  . Smoking status: Former Smoker    Packs/day: 0.50    Years: 20.00    Pack years: 10.00    Types: Cigarettes      Last attempt to quit: 01/02/1997    Years since quitting: 21.0  . Smokeless tobacco: Never Used  Substance and Sexual Activity  . Alcohol use: No  . Drug use: No  . Sexual activity: Not on file  Lifestyle  . Physical activity:    Days per week: Not on file    Minutes per session: Not on file  . Stress: Not on file  Relationships  . Social connections:    Talks on phone: Not on file    Gets together: Not on file    Attends religious service: Not on file    Active member of club or organization: Not on file    Attends meetings of clubs or organizations: Not on file    Relationship status: Not on file  Other Topics Concern  . Not on file  Social History Narrative   Lives with husband and 3 children in a 2 story home.  Retired professor at [employer].  Education: college.      Family History:  The patient's family history includes Healthy in her brother; Heart attack in her brother and father; Hypertension in her brother, father, and mother; Stroke in her mother.   ROS:   Please see the history of present illness.    Concerned about her husband.  May have some component of depression.  Still bothered by right arm neuropathic pain that has plagued her since surgery.  Otherwise no complaints. All other systems reviewed and are negative.   PHYSICAL EXAM:   VS:  BP 118/62   Pulse 81   Ht 5\' 1"  (1.549 m)   Wt 158 lb 3.2 oz (71.8 kg)   BMI 29.89 kg/m    GEN: Well nourished, well developed, in no acute distress  HEENT: normal  Neck: no JVD, carotid bruits, or masses Cardiac: RRR; no murmurs, rubs, or gallops,no edema  Respiratory:  clear to auscultation bilaterally, normal work of breathing GI: soft, nontender, nondistended, + BS MS: no deformity or atrophy  Skin: warm and dry, no rash Neuro:  Alert and Oriented x 3, Strength and sensation are intact Psych: euthymic mood, full affect  Wt Readings from Last 3 Encounters:  01/29/18 158 lb 3.2 oz (71.8 kg)  10/09/17 158 lb 2 oz  (71.7 kg)  07/12/17 156 lb (70.8 kg)      Studies/Labs Reviewed:   EKG:  EKG not repeated  Recent Labs: 04/17/2017: TSH 2.107 05/04/2017: Magnesium 1.8 05/05/2017: ALT 62; Hemoglobin 8.7; Platelets 143 05/12/2017: BUN 17; Creatinine, Ser 1.32; Potassium 4.9; Sodium 141   Lipid Panel    Component Value Date/Time   CHOL 117 04/17/2017 0014   TRIG 207 (H) 04/17/2017 0014   HDL 35 (L) 04/17/2017 0014  CHOLHDL 3.3 04/17/2017 0014   VLDL 41 (H) 04/17/2017 0014   LDLCALC 41 04/17/2017 0014    Additional studies/ records that were reviewed today include:  Data    ASSESSMENT:    1. Coronary artery disease involving coronary bypass graft of native heart with angina pectoris (HCC)   2. Familial hypercholesterolemia   3. Essential hypertension   4. Diabetes mellitus, type II, insulin dependent (HCC)      PLAN:  In order of problems listed above:  1. She is doing well without angina now nearly 12 months status post coronary bypass grafting.  Decrease aspirin to 81 mg/day in September. 2. LDL target less than 70.  I do not have a recent post surgery LDL.  We will draw it today. 3. Blood pressure target 130/80 mmHg or less.  Excellently controlled currently on olmesartan/amlodipine/HCTZ 40/10/5 milligrams.  Also on Coreg 6.25 mg twice daily. 4. Needs to have SGLT-2 added to her regimen to receive the protective benefits against heart failure and ischemic events that has been recently identified.  Hemoglobin A1c less than 7 is our target.  Most recent value was 7.5 last September.   Lipid panel today.  Inquire if SGLT-2 therapy could be used in her clinical situation.  Tight and risk factor modification as appropriate.   Medication Adjustments/Labs and Tests Ordered: Current medicines are reviewed at length with the patient today.  Concerns regarding medicines are outlined above.  Medication changes, Labs and Tests ordered today are listed in the Patient Instructions below. Patient  Instructions  Medication Instructions:  1) DECREASE Aspirin to 81mg  once daily  Labwork: Lipid today  Testing/Procedures: None  Follow-Up: Your physician wants you to follow-up in: 6-9 months with Dr. Katrinka Blazing.  You will receive a reminder letter in the mail two months in advance. If you don't receive a letter, please call our office to schedule the follow-up appointment.   Any Other Special Instructions Will Be Listed Below (If Applicable).     If you need a refill on your cardiac medications before your next appointment, please call your pharmacy.      Signed, Lesleigh Noe, MD  01/29/2018 12:45 PM    [facility] [address] [place], [place], [place]  [address] Phone: [phone]; Fax: [phone]

## 2018-01-29 ENCOUNTER — Encounter: Payer: Self-pay | Admitting: Interventional Cardiology

## 2018-01-29 ENCOUNTER — Ambulatory Visit: Payer: Medicare Other | Admitting: Interventional Cardiology

## 2018-01-29 VITALS — BP 118/62 | HR 81 | Ht 61.0 in | Wt 158.2 lb

## 2018-01-29 DIAGNOSIS — I10 Essential (primary) hypertension: Secondary | ICD-10-CM | POA: Diagnosis not present

## 2018-01-29 DIAGNOSIS — Z794 Long term (current) use of insulin: Secondary | ICD-10-CM

## 2018-01-29 DIAGNOSIS — E7801 Familial hypercholesterolemia: Secondary | ICD-10-CM

## 2018-01-29 DIAGNOSIS — E119 Type 2 diabetes mellitus without complications: Secondary | ICD-10-CM

## 2018-01-29 DIAGNOSIS — I25709 Atherosclerosis of coronary artery bypass graft(s), unspecified, with unspecified angina pectoris: Secondary | ICD-10-CM | POA: Diagnosis not present

## 2018-01-29 MED ORDER — ASPIRIN EC 81 MG PO TBEC: 81.0000 mg | DELAYED_RELEASE_TABLET | Freq: Every day | ORAL | 3 refills | Status: AC

## 2018-01-29 NOTE — Patient Instructions (Signed)
Medication Instructions:  1) DECREASE Aspirin to 81mg  once daily  Labwork: Lipid today  Testing/Procedures: None  Follow-Up: Your physician wants you to follow-up in: 6-9 months with Dr. Katrinka BlazingSmith.  You will receive a reminder letter in the mail two months in advance. If you don't receive a letter, please call our office to schedule the follow-up appointment.   Any Other Special Instructions Will Be Listed Below (If Applicable).     If you need a refill on your cardiac medications before your next appointment, please call your pharmacy.

## 2018-01-30 ENCOUNTER — Encounter: Payer: Self-pay | Admitting: Physical Therapy

## 2018-01-30 ENCOUNTER — Ambulatory Visit: Payer: Medicare Other | Admitting: Physical Therapy

## 2018-01-30 ENCOUNTER — Other Ambulatory Visit: Payer: Self-pay | Admitting: Interventional Cardiology

## 2018-01-30 DIAGNOSIS — M25511 Pain in right shoulder: Secondary | ICD-10-CM | POA: Diagnosis not present

## 2018-01-30 DIAGNOSIS — R293 Abnormal posture: Secondary | ICD-10-CM

## 2018-01-30 DIAGNOSIS — R29898 Other symptoms and signs involving the musculoskeletal system: Secondary | ICD-10-CM

## 2018-01-30 DIAGNOSIS — M25611 Stiffness of right shoulder, not elsewhere classified: Secondary | ICD-10-CM

## 2018-01-30 LAB — LIPID PANEL
Chol/HDL Ratio: 2.7 ratio (ref 0.0–4.4)
Cholesterol, Total: 142 mg/dL (ref 100–199)
HDL: 52 mg/dL (ref >39)
LDL Calculated: 50 mg/dL (ref 0–99)
Triglycerides: 198 mg/dL — ABNORMAL HIGH (ref 0–149)
VLDL Cholesterol Cal: 40 mg/dL (ref 5–40)

## 2018-01-30 MED ORDER — CARVEDILOL 6.25 MG PO TABS: 6.2500 mg | ORAL_TABLET | Freq: Two times a day (BID) | ORAL | 11 refills | Status: DC

## 2018-01-30 NOTE — Telephone Encounter (Signed)
Pt's medication was sent to pt's pharmacy as requested. Confirmation received.  °

## 2018-01-30 NOTE — Therapy (Signed)
[facility] [address]  [address] [place], [place], [address] Phone: [phone]   Fax:  [phone]  Physical Therapy Treatment  Patient Details  Name: Andrea [name] MRN: [id number] Date of Birth: 09-Nov-1945 Referring Provider: Eula Listenominic McKinley, MD   Encounter Date: 01/30/2018  PT End of Session - 01/30/18 1401    Visit Number  5    Number of Visits  17    Date for PT Re-Evaluation  03/05/18    Authorization Type  [employer] Medicare    PT Start Time  1319    PT Stop Time  1359    PT Time Calculation (min)  40 min    Activity Tolerance  Patient tolerated treatment well    Behavior During Therapy  [facility] for tasks assessed/performed       Past Medical History:  Diagnosis Date  . Anemia 04/30/2017  . CHF (congestive heart failure) (HCC)   . Diabetes mellitus   . Hypertension   . Hypokalemia 04/2017  . Macular degeneration disease    loss of central vision on left and decreased peripheral vision.   . Stroke (HCC) 12/2011   no residuals noted    Past Surgical History:  Procedure Laterality Date  . CHOLECYSTECTOMY    . CORONARY ARTERY BYPASS GRAFT N/A 04/18/2017   Procedure: CORONARY ARTERY BYPASS GRAFTING (CABG) times four using left internal mammary artery and right leg saphenous vein;  Surgeon: Kerin PernaVan Trigt, Peter, MD;  [facility];  Service: Open Heart Surgery;  Laterality: N/A;  . LEFT HEART CATH AND CORONARY ANGIOGRAPHY N/A 04/17/2017   Procedure: LEFT HEART CATH AND CORONARY ANGIOGRAPHY;  Surgeon: Marykay LexHarding, David W, MD;  [facility];  Service: Cardiovascular;  Laterality: N/A;  . ROTATOR CUFF REPAIR     left  . TEE WITHOUT CARDIOVERSION N/A 04/18/2017   Procedure: TRANSESOPHAGEAL ECHOCARDIOGRAM (TEE);  Surgeon: Donata ClayVan Trigt, Theron AristaPeter, MD;  [facility];  Service: Open Heart Surgery;  Laterality: N/A;    There were no vitals filed for this visit.  Subjective Assessment - 01/30/18 1321    Subjective   Patient reports she is "all out of shape again" since she was at a conference last week. Did bring a cane in order to do HEP. Tried pulley as well. R shoulder has been okay overall. Checked BG before session- was 127.    Pertinent History  CABG x4, DM, HTN, stroke, L RTC repair    Diagnostic tests  per patient, xray on R shoulder    Patient Stated Goals  want to get my range of motion back without pain, being able to do my hair    Currently in Pain?  Yes    Pain Score  3     Pain Location  Shoulder    Pain Orientation  Right    Pain Descriptors / Indicators  Dull    Pain Type  Chronic pain                       [facility] Adult PT Treatment/Exercise - 01/30/18 0001      Shoulder Exercises: Supine   Protraction  Strengthening;Both;15 reps wand    External Rotation  AAROM;Strengthening;Both;10 reps;Limitations    External Rotation Limitations  with cane and OP from PT at R elbow      Shoulder Exercises: Sidelying   External Rotation  Strengthening;Right;10 reps;Weights;Limitations    External Rotation Weight (lbs)  1#  External Rotation Limitations  2x10       Shoulder Exercises: Standing   Retraction  Strengthening;Both;15 reps;Theraband    Theraband Level (Shoulder Retraction)  Level 2 (Red)    Other Standing Exercises  Standing horizontal ABD B UE; yellow TB; 15x    Other Standing Exercises  Standing B UE ER; yellow TB; 15x      Shoulder Exercises: Pulleys   Flexion  3 minutes    ABduction  3 minutes      Manual Therapy   Manual Therapy  Passive ROM;Joint mobilization    Manual therapy comments  patient hooklying    Joint Mobilization  R shoulder mobs- inferior, posterior, long arm distraction grade III/IV    Passive ROM  PROM all planes to tolerance             PT Education - 01/30/18 1401    Education Details  addition to HEP and administered red and yellow TB    Person(s) Educated  Patient    Methods  Explanation;Demonstration;Tactile cues;Verbal  cues;Handout    Comprehension  Returned demonstration;Verbalized understanding       PT Short Term Goals - 01/18/18 1709      PT SHORT TERM GOAL #1   Title  patient to be independent with initial HEP    Time  4    Period  Weeks    Status  Achieved        PT Long Term Goals - 01/16/18 1618      PT LONG TERM GOAL #1   Title  patient to be independent with advanced HEP    Time  8    Period  Weeks    Status  On-going      PT LONG TERM GOAL #2   Title  patient to demonstrate R shoulder AROM to WNL without pain limiting    Time  8    Period  Weeks    Status  On-going      PT LONG TERM GOAL #3   Title  patient to report ability to reach top of head with R UE in order to allow for hair dressing.    Time  8    Period  Weeks    Status  On-going      PT LONG TERM GOAL #4   Title  patient to demonstrate R shoulder strength to >/= 4/5    Time  8    Period  Weeks    Status  On-going            Plan - 01/30/18 1402    Clinical Impression Statement  Patient arrived to session with very mild report of pain, reported that she tried doing HEP while away at a conference last week. Patient tolerated PROM and mobs to R shoulder to improve ROM. Patient still severely limited in ER and with moderate muscle guarding. Improvement in flexion and abduction after inferior and posterior mobilizations. Patient tolerated periscapular and RTC strengthening on R UE with banded resistance. VCs and TCs required to correct form but improved this session. Updated HEP and gave patient handout for banded resistance exercises. Patient reported understanding. Advised patient to also continue ROM exercises as ROM is her limiting factor.    PT Treatment/Interventions  ADLs/Self Care Home Management;Cryotherapy;Electrical Stimulation;Iontophoresis 4mg /ml Dexamethasone;Moist Heat;Therapeutic exercise;Therapeutic activities;Ultrasound;Neuromuscular re-education;Patient/family education;Manual  techniques;Vasopneumatic Device;Taping;Dry needling;Passive range of motion;Energy conservation;Splinting    Consulted and Agree with Plan of Care  Patient       Patient will  benefit from skilled therapeutic intervention in order to improve the following deficits and impairments:  Hypomobility, Decreased activity tolerance, Decreased strength, Impaired UE functional use, Pain, Decreased mobility, Decreased range of motion, Postural dysfunction, Impaired flexibility  Visit Diagnosis: Acute pain of right shoulder  Stiffness of right shoulder, not elsewhere classified  Abnormal posture  Other symptoms and signs involving the musculoskeletal system     Problem List Patient Active Problem List   Diagnosis Date Noted  . Pressure injury of skin 05/01/2017  . Coronary artery disease involving coronary bypass graft of native heart with angina pectoris (HCC) 04/30/2017  . SIRS (systemic inflammatory response syndrome) (HCC) 04/30/2017  . Symptomatic anemia 04/30/2017  . Hyperbilirubinemia 04/30/2017  . AKI (acute kidney injury) (HCC) 04/30/2017  . History of CVA in adulthood 04/30/2017  . Fever in adult   . Hypokalemia   . Jaundice   . Hx of CABG 04/18/2017  . Non-ST elevation (NSTEMI) myocardial infarction (HCC)   . Hyperlipidemia 03/14/2016  . Hypertensive heart disease 12/17/2015  . Diabetes mellitus, type II, insulin dependent (HCC) 01/04/2012  . Hypertension   . Stroke [facility])     Anette Guarneri, PT, DPT 01/30/18 2:05 PM   [facility] [address]  [address] [place], [place], [address] Phone: [phone]   Fax:  [phone]  Name: Andrea [name] MRN: [id number] Date of Birth: 1945-08-20

## 2018-01-31 ENCOUNTER — Telehealth: Payer: Self-pay | Admitting: *Deleted

## 2018-01-31 MED ORDER — ICOSAPENT ETHYL 1 G PO CAPS: 1.0000 g | ORAL_CAPSULE | Freq: Two times a day (BID) | ORAL | 11 refills | Status: DC

## 2018-01-31 NOTE — Telephone Encounter (Signed)
See lab result note.

## 2018-01-31 NOTE — Telephone Encounter (Signed)
-----   Message from Lyn RecordsHenry W Smith, MD sent at 01/31/2018 12:52 PM EDT ----- Let the patient know cholesterol is excellent.  We need to treat the elevated triglycerides.  I recommend icosapent 1 g twice daily A copy will be sent to Renford DillsPolite, Ronald, MD

## 2018-02-01 ENCOUNTER — Ambulatory Visit: Payer: Medicare Other | Admitting: Physical Therapy

## 2018-02-05 ENCOUNTER — Ambulatory Visit: Payer: Medicare Other | Attending: Family Medicine | Admitting: Physical Therapy

## 2018-02-05 ENCOUNTER — Encounter: Payer: Self-pay | Admitting: Physical Therapy

## 2018-02-05 DIAGNOSIS — R29898 Other symptoms and signs involving the musculoskeletal system: Secondary | ICD-10-CM | POA: Diagnosis present

## 2018-02-05 DIAGNOSIS — M25511 Pain in right shoulder: Secondary | ICD-10-CM | POA: Diagnosis present

## 2018-02-05 DIAGNOSIS — M25611 Stiffness of right shoulder, not elsewhere classified: Secondary | ICD-10-CM | POA: Insufficient documentation

## 2018-02-05 DIAGNOSIS — R293 Abnormal posture: Secondary | ICD-10-CM

## 2018-02-05 NOTE — Therapy (Signed)
[facility] [address]  [address] [place], [place], [address] Phone: [phone]   Fax:  [phone]  Physical Therapy Treatment  Patient Details  Name: Andrea [name] MRN: [id number] Date of Birth: [birth year] Referring Provider: Eula Listen, MD   Encounter Date: 02/05/2018  PT End of Session - 02/05/18 1402    Visit Number  6    Number of Visits  17    Date for PT Re-Evaluation  03/05/18    Authorization Type  UHC Medicare    PT Start Time  1317    PT Stop Time  1400    PT Time Calculation (min)  43 min    Activity Tolerance  Patient tolerated treatment well;Patient limited by pain    Behavior During Therapy  [facility] for tasks assessed/performed       Past Medical History:  Diagnosis Date  . Anemia 04/30/2017  . CHF (congestive heart failure) (HCC)   . Diabetes mellitus   . Hypertension   . Hypokalemia 04/2017  . Macular degeneration disease    loss of central vision on left and decreased peripheral vision.   . Stroke (HCC) 12/2011   no residuals noted    Past Surgical History:  Procedure Laterality Date  . CHOLECYSTECTOMY    . CORONARY ARTERY BYPASS GRAFT N/A 04/18/2017   Procedure: CORONARY ARTERY BYPASS GRAFTING (CABG) times four using left internal mammary artery and right leg saphenous vein;  Surgeon: Kerin Perna, MD;  Location: [facility] OR;  Service: Open Heart Surgery;  Laterality: N/A;  . LEFT HEART CATH AND CORONARY ANGIOGRAPHY N/A 04/17/2017   Procedure: LEFT HEART CATH AND CORONARY ANGIOGRAPHY;  Surgeon: Marykay Lex, MD;  Location: [facility] INVASIVE CV LAB;  Service: Cardiovascular;  Laterality: N/A;  . ROTATOR CUFF REPAIR     left  . TEE WITHOUT CARDIOVERSION N/A 04/18/2017   Procedure: TRANSESOPHAGEAL ECHOCARDIOGRAM (TEE);  Surgeon: Donata Clay, Theron Arista, MD;  Location: [facility] OR;  Service: Open Heart Surgery;  Laterality: N/A;    There were no vitals filed for this visit.  Subjective Assessment -  02/05/18 1321    Subjective  Reports she had a busy weekend. Reports compliance with HEP but not 2x/day. Reports BG was 160 before session.     Pertinent History  CABG x4, DM, HTN, stroke, L RTC repair    Diagnostic tests  per patient, xray on R shoulder    Patient Stated Goals  want to get my range of motion back without pain, being able to do my hair    Currently in Pain?  Yes    Pain Score  3     Pain Location  Arm    Pain Orientation  Right    Pain Descriptors / Indicators  Aching    Pain Type  Chronic pain                       [facility] Adult PT Treatment/Exercise - 02/05/18 0001      Exercises   Exercises  Shoulder      Shoulder Exercises: Supine   Protraction  Strengthening;Both;15 reps      Shoulder Exercises: Sidelying   External Rotation  Strengthening;Right;Weights;Limitations;15 reps    External Rotation Weight (lbs)  1#    External Rotation Limitations  2x15      Shoulder Exercises: Standing   Flexion  AAROM;Both;10 reps;Limitations    Flexion Limitations  rolling ball up wall into flexion; 10x  Shoulder Exercises: Pulleys   Flexion  3 minutes    ABduction  3 minutes      Shoulder Exercises: ROM/Strengthening   Lat Pull  10 reps;Limitations    Lat Pull Limitations  10x5# with heavy VC/TCs to sit up tall; focus on flexion on the way up      Shoulder Exercises: Stretch   Other Shoulder Stretches  ER stretch in doorway R UE- 3x20 sec with PT OP      Manual Therapy   Manual Therapy  Passive ROM;Joint mobilization    Manual therapy comments  patient hooklying    Joint Mobilization  R shoulder mobs- inferior, posterior, long arm distraction grade III/IV; TTP with pressur eover ant shoulder    Soft tissue mobilization  R bicep muscle belly- TTP and soft tissue restriction/trigger pts throughout    Passive ROM  PROM all planes to tolerance             PT Education - 02/05/18 1401    Education Details  addition to HEP    Person(s)  Educated  Patient    Methods  Explanation;Demonstration;Tactile cues;Verbal cues;Handout    Comprehension  Returned demonstration;Verbalized understanding       PT Short Term Goals - 01/18/18 1709      PT SHORT TERM GOAL #1   Title  patient to be independent with initial HEP    Time  4    Period  Weeks    Status  Achieved        PT Long Term Goals - 01/16/18 1618      PT LONG TERM GOAL #1   Title  patient to be independent with advanced HEP    Time  8    Period  Weeks    Status  On-going      PT LONG TERM GOAL #2   Title  patient to demonstrate R shoulder AROM to WNL without pain limiting    Time  8    Period  Weeks    Status  On-going      PT LONG TERM GOAL #3   Title  patient to report ability to reach top of head with R UE in order to allow for hair dressing.    Time  8    Period  Weeks    Status  On-going      PT LONG TERM GOAL #4   Title  patient to demonstrate R shoulder strength to >/= 4/5    Time  8    Period  Weeks    Status  On-going            Plan - 02/05/18 1403    Clinical Impression Statement  Patient arrived to session with no new complaints. Reporting compliance with HEP but had not been performing HEP 2x a day. Encouraged patient to consistently perform HEP to encourage progress. Patient tolerated PROM and jt mobs to R shoulder with muscle guarding at end range. Patient also tolerated STM to R bicep- TTP and soft tissue retraction noted throughout muscle belly. Patient reporting most of her pain stems from the bicep. Patient performed doorway ER stretch with PT OP to keep elbow at side- patient reporting pain at end range; advised to ease off. Patient also tolerated lat pulldowns with focus on flexion stretch on the way up- required VC/TCs for form. Addition of sidelying ER with 2# to HEP this date- patient given handout and reported understanding. No pain at end of session.  PT Treatment/Interventions  ADLs/Self Care Home  Management;Cryotherapy;Electrical Stimulation;Iontophoresis 4mg /ml Dexamethasone;Moist Heat;Therapeutic exercise;Therapeutic activities;Ultrasound;Neuromuscular re-education;Patient/family education;Manual techniques;Vasopneumatic Device;Taping;Dry needling;Passive range of motion;Energy conservation;Splinting    PT Next Visit Plan  reassess lat pulldown     Consulted and Agree with Plan of Care  Patient       Patient will benefit from skilled therapeutic intervention in order to improve the following deficits and impairments:  Hypomobility, Decreased activity tolerance, Decreased strength, Impaired UE functional use, Pain, Decreased mobility, Decreased range of motion, Postural dysfunction, Impaired flexibility  Visit Diagnosis: Acute pain of right shoulder  Stiffness of right shoulder, not elsewhere classified  Abnormal posture  Other symptoms and signs involving the musculoskeletal system     Problem List Patient Active Problem List   Diagnosis Date Noted  . Pressure injury of skin 05/01/2017  . Coronary artery disease involving coronary bypass graft of native heart with angina pectoris (HCC) 04/30/2017  . SIRS (systemic inflammatory response syndrome) (HCC) 04/30/2017  . Symptomatic anemia 04/30/2017  . Hyperbilirubinemia 04/30/2017  . AKI (acute kidney injury) (HCC) 04/30/2017  . History of CVA in adulthood 04/30/2017  . Fever in adult   . Hypokalemia   . Jaundice   . Hx of CABG 04/18/2017  . Non-ST elevation (NSTEMI) myocardial infarction (HCC)   . Hyperlipidemia 03/14/2016  . Hypertensive heart disease 12/17/2015  . Diabetes mellitus, type II, insulin dependent (HCC) 01/04/2012  . Hypertension   . Stroke [facility](HCC)     Anette GuarneriYevgeniya Jazzmen Restivo, PT, DPT 02/05/18 3:55 PM   [facility] [address]  [address] [place], [place], [address] Phone: [phone]   Fax:  [phone]  Name: Andrea [name] MRN:  [id number] Date of Birth: 08-02-1946

## 2018-02-07 ENCOUNTER — Ambulatory Visit: Payer: Medicare Other | Admitting: Physical Therapy

## 2018-02-07 ENCOUNTER — Encounter: Payer: Self-pay | Admitting: Physical Therapy

## 2018-02-07 DIAGNOSIS — M25511 Pain in right shoulder: Secondary | ICD-10-CM

## 2018-02-07 DIAGNOSIS — R293 Abnormal posture: Secondary | ICD-10-CM

## 2018-02-07 DIAGNOSIS — M25611 Stiffness of right shoulder, not elsewhere classified: Secondary | ICD-10-CM

## 2018-02-07 DIAGNOSIS — R29898 Other symptoms and signs involving the musculoskeletal system: Secondary | ICD-10-CM

## 2018-02-07 NOTE — Therapy (Signed)
[facility] Outpatient Rehabilitation [facility] [address]  [address] [place], [place], [address] Phone: [phone]   Fax:  [phone]  Physical Therapy Treatment  Patient Details  Name: Andrea [name] MRN: [id number] Date of Birth: 10-21-1945 Referring Provider: Eula Listen, MD   Encounter Date: 02/07/2018  PT End of Session - 02/07/18 1402    Visit Number  7    Number of Visits  17    Date for PT Re-Evaluation  03/05/18    Authorization Type  UHC Medicare    PT Start Time  1319    PT Stop Time  1405    PT Time Calculation (min)  46 min    Activity Tolerance  Patient tolerated treatment well    Behavior During Therapy  [facility] for tasks assessed/performed       Past Medical History:  Diagnosis Date  . Anemia 04/30/2017  . CHF (congestive heart failure) (HCC)   . Diabetes mellitus   . Hypertension   . Hypokalemia 04/2017  . Macular degeneration disease    loss of central vision on left and decreased peripheral vision.   . Stroke (HCC) 12/2011   no residuals noted    Past Surgical History:  Procedure Laterality Date  . CHOLECYSTECTOMY    . CORONARY ARTERY BYPASS GRAFT N/A 04/18/2017   Procedure: CORONARY ARTERY BYPASS GRAFTING (CABG) times four using left internal mammary artery and right leg saphenous vein;  Surgeon: Kerin Perna, MD;  Location: [facility] OR;  Service: Open Heart Surgery;  Laterality: N/A;  . LEFT HEART CATH AND CORONARY ANGIOGRAPHY N/A 04/17/2017   Procedure: LEFT HEART CATH AND CORONARY ANGIOGRAPHY;  Surgeon: Marykay Lex, MD;  Location: [facility] INVASIVE CV LAB;  Service: Cardiovascular;  Laterality: N/A;  . ROTATOR CUFF REPAIR     left  . TEE WITHOUT CARDIOVERSION N/A 04/18/2017   Procedure: TRANSESOPHAGEAL ECHOCARDIOGRAM (TEE);  Surgeon: Donata Clay, Theron Arista, MD;  Location: [facility] OR;  Service: Open Heart Surgery;  Laterality: N/A;    There were no vitals filed for this visit.  Subjective Assessment - 02/07/18 1321    Subjective   Reports BG 155 before session. Notes that she did an exercises before the session today. Sees MD on 07/17. Reports L wrist locking up Monday but does not think it was from her exercises. Had trouble putting on her seatbelt.     Pertinent History  CABG x4, DM, HTN, stroke, L RTC repair    Diagnostic tests  per patient, xray on R shoulder    Patient Stated Goals  want to get my range of motion back without pain, being able to do my hair    Currently in Pain?  Yes    Pain Score  4     Pain Location  Arm    Pain Orientation  Right    Pain Descriptors / Indicators  Aching    Pain Type  Chronic pain                       [facility] Adult PT Treatment/Exercise - 02/07/18 0001      Shoulder Exercises: Supine   Protraction  Strengthening;Both;10 reps;Weights    Protraction Weight (lbs)  2#      Shoulder Exercises: Seated   Row  Strengthening;Both;15 reps;Theraband    Theraband Level (Shoulder Row)  Level 2 (Red)    Flexion  Strengthening;Right;10 reps;Theraband;Limitations    Theraband Level (Shoulder Flexion)  Level 1 (Yellow)  Flexion Limitations  VC/TCs to correct shoulder hiking    Abduction  Right;Strengthening;10 reps;Theraband;Limitations    Theraband Level (Shoulder ABduction)  Level 1 (Yellow)    ABduction Limitations  VCs to keep shoulders and hips squared      Shoulder Exercises: Sidelying   External Rotation  Strengthening;Right;Weights;Limitations;15 reps    External Rotation Weight (lbs)  2#    ABduction  Right;AROM;10 reps;Limitations    ABduction Limitations  thumb up      Shoulder Exercises: Standing   Horizontal ABduction  Strengthening;Both;10 reps;Theraband;Limitations    Theraband Level (Shoulder Horizontal ABduction)  Level 1 (Yellow)    Horizontal ABduction Limitations  cues to correct upright posture    Flexion  AAROM;Both;10 reps;Limitations    Flexion Limitations  rolling ball up wall into flexion; 10x      Shoulder Exercises: Pulleys   Flexion   3 minutes    ABduction  3 minutes      Shoulder Exercises: ROM/Strengthening   Lat Pull  10 reps;Limitations    Lat Pull Limitations  10x5# with heavy VC/TCs to sit up tall and scap retraction; focus on flexion on the way up unable to tolerate 10#      Manual Therapy   Manual Therapy  Passive ROM;Joint mobilization    Manual therapy comments  patient hooklying    Joint Mobilization  R shoulder mobs- inferior, posterior, long arm distraction grade III/IV    Soft tissue mobilization  R bicep muscle belly- TTP and soft tissue restriction/trigger pts throughout    Passive ROM  PROM all planes to tolerance               PT Short Term Goals - 01/18/18 1709      PT SHORT TERM GOAL #1   Title  patient to be independent with initial HEP    Time  4    Period  Weeks    Status  Achieved        PT Long Term Goals - 01/16/18 1618      PT LONG TERM GOAL #1   Title  patient to be independent with advanced HEP    Time  8    Period  Weeks    Status  On-going      PT LONG TERM GOAL #2   Title  patient to demonstrate R shoulder AROM to WNL without pain limiting    Time  8    Period  Weeks    Status  On-going      PT LONG TERM GOAL #3   Title  patient to report ability to reach top of head with R UE in order to allow for hair dressing.    Time  8    Period  Weeks    Status  On-going      PT LONG TERM GOAL #4   Title  patient to demonstrate R shoulder strength to >/= 4/5    Time  8    Period  Weeks    Status  On-going            Plan - 02/07/18 1406    Clinical Impression Statement  Patient arrived to session with report that on Monday her L wrist locked up and had trouble putting on seatbelt. Denied N/T. Tolerated R shoulder PROM in all planes and joint mobs to tolerance. Also tolerated STM to R bicep with decreased tenderness this date. Patient able to perform sidelying ER and ABD with good form. Reports when  doing sidelying ER at home she heard popping in her  shoulder; advised patient to perform this exercise with scapular retraction and avoid IR of shoulders. Patient tolerated light banded resistance with shoulder flexion and ABD- VCs required to avoid shoulder hiking. Difficulty performing lat pulldown- patient with severe posterior lean and unable to correct but responsive to cues to squeeze scapulae. No c/o shoulder pain at end of session.    PT Treatment/Interventions  ADLs/Self Care Home Management;Cryotherapy;Electrical Stimulation;Iontophoresis 4mg /ml Dexamethasone;Moist Heat;Therapeutic exercise;Therapeutic activities;Ultrasound;Neuromuscular re-education;Patient/family education;Manual techniques;Vasopneumatic Device;Taping;Dry needling;Passive range of motion;Energy conservation;Splinting    Consulted and Agree with Plan of Care  Patient       Patient will benefit from skilled therapeutic intervention in order to improve the following deficits and impairments:  Hypomobility, Decreased activity tolerance, Decreased strength, Impaired UE functional use, Pain, Decreased mobility, Decreased range of motion, Postural dysfunction, Impaired flexibility  Visit Diagnosis: Acute pain of right shoulder  Stiffness of right shoulder, not elsewhere classified  Abnormal posture  Other symptoms and signs involving the musculoskeletal system     Problem List Patient Active Problem List   Diagnosis Date Noted  . Pressure injury of skin 05/01/2017  . Coronary artery disease involving coronary bypass graft of native heart with angina pectoris (HCC) 04/30/2017  . SIRS (systemic inflammatory response syndrome) (HCC) 04/30/2017  . Symptomatic anemia 04/30/2017  . Hyperbilirubinemia 04/30/2017  . AKI (acute kidney injury) (HCC) 04/30/2017  . History of CVA in adulthood 04/30/2017  . Fever in adult   . Hypokalemia   . Jaundice   . Hx of CABG 04/18/2017  . Non-ST elevation (NSTEMI) myocardial infarction (HCC)   . Hyperlipidemia 03/14/2016  .  Hypertensive heart disease 12/17/2015  . Diabetes mellitus, type II, insulin dependent (HCC) 01/04/2012  . Hypertension   . Stroke [facility])     Anette Guarneri, PT, DPT 02/07/18 2:14 PM   [facility] [address]  [address] [place], [place], [address] Phone: [phone]   Fax:  [phone]  Name: Andrea [name] MRN: [id number] Date of Birth: [birth year]

## 2018-02-12 ENCOUNTER — Encounter: Payer: Self-pay | Admitting: Physical Therapy

## 2018-02-12 ENCOUNTER — Ambulatory Visit: Payer: Medicare Other | Admitting: Physical Therapy

## 2018-02-12 DIAGNOSIS — M25511 Pain in right shoulder: Secondary | ICD-10-CM | POA: Diagnosis not present

## 2018-02-12 DIAGNOSIS — M25611 Stiffness of right shoulder, not elsewhere classified: Secondary | ICD-10-CM

## 2018-02-12 DIAGNOSIS — R293 Abnormal posture: Secondary | ICD-10-CM

## 2018-02-12 DIAGNOSIS — R29898 Other symptoms and signs involving the musculoskeletal system: Secondary | ICD-10-CM

## 2018-02-12 NOTE — Therapy (Signed)
[facility] Outpatient Rehabilitation [facility] [address]  [address] [place], [place], [address] Phone: [phone]   Fax:  [phone]  Physical Therapy Treatment  Patient Details  Name: Andrea [name] MRN: [id number] Date of Birth: 1946/07/10 Referring Provider: Eula Listen, MD   Encounter Date: 02/12/2018  PT End of Session - 02/12/18 1551    Visit Number  8    Number of Visits  17    Date for PT Re-Evaluation  03/05/18    Authorization Type  UHC Medicare    PT Start Time  1316    PT Stop Time  1400    PT Time Calculation (min)  44 min    Activity Tolerance  Patient tolerated treatment well;Patient limited by pain    Behavior During Therapy  [facility] for tasks assessed/performed       Past Medical History:  Diagnosis Date  . Anemia 04/30/2017  . CHF (congestive heart failure) (HCC)   . Diabetes mellitus   . Hypertension   . Hypokalemia 04/2017  . Macular degeneration disease    loss of central vision on left and decreased peripheral vision.   . Stroke (HCC) 12/2011   no residuals noted    Past Surgical History:  Procedure Laterality Date  . CHOLECYSTECTOMY    . CORONARY ARTERY BYPASS GRAFT N/A 04/18/2017   Procedure: CORONARY ARTERY BYPASS GRAFTING (CABG) times four using left internal mammary artery and right leg saphenous vein;  Surgeon: Kerin Perna, MD;  Location: [facility] OR;  Service: Open Heart Surgery;  Laterality: N/A;  . LEFT HEART CATH AND CORONARY ANGIOGRAPHY N/A 04/17/2017   Procedure: LEFT HEART CATH AND CORONARY ANGIOGRAPHY;  Surgeon: Marykay Lex, MD;  Location: [facility] INVASIVE CV LAB;  Service: Cardiovascular;  Laterality: N/A;  . ROTATOR CUFF REPAIR     left  . TEE WITHOUT CARDIOVERSION N/A 04/18/2017   Procedure: TRANSESOPHAGEAL ECHOCARDIOGRAM (TEE);  Surgeon: Donata Clay, Theron Arista, MD;  Location: [facility] OR;  Service: Open Heart Surgery;  Laterality: N/A;    There were no vitals filed for this visit.  Subjective Assessment -  02/12/18 1320    Subjective  Patient with no new complaints. Went through her storage shed today. BG 200 at beginning of session; reports she had her insulin injection before she came to PT.    Pertinent History  CABG x4, DM, HTN, stroke, L RTC repair    Diagnostic tests  per patient, xray on R shoulder    Patient Stated Goals  want to get my range of motion back without pain, being able to do my hair    Currently in Pain?  Yes    Pain Score  3     Pain Location  Shoulder    Pain Orientation  Right    Pain Descriptors / Indicators  Aching    Pain Type  Chronic pain                       [facility] Adult PT Treatment/Exercise - 02/12/18 0001      Shoulder Exercises: Supine   Protraction  Strengthening;Both;Weights;15 reps 3x15    Protraction Weight (lbs)  2#      Shoulder Exercises: Seated   External Rotation  AAROM    Flexion  Strengthening;Right;10 reps;Limitations    Flexion Limitations  rolling orange pball into flexion 10x3"      Shoulder Exercises: Prone   Other Prone Exercises  quadruped rocking into shoulder flexion; 10x  Shoulder Exercises: Sidelying   External Rotation  Strengthening;Right;Weights;Limitations;15 reps TCs for scap retraction    External Rotation Weight (lbs)  2#      Shoulder Exercises: Standing   Horizontal ABduction  Strengthening;Both;10 reps;Theraband;Limitations    Theraband Level (Shoulder Horizontal ABduction)  Level 1 (Yellow)    Horizontal ABduction Limitations  cues for scap retraction    Row  Strengthening;Both;10 reps;Theraband;Limitations    Theraband Level (Shoulder Row)  Level 2 (Red)    Row Limitations  VCs to depress L shoulder      Shoulder Exercises: Pulleys   Flexion  3 minutes    ABduction  3 minutes      Shoulder Exercises: Stretch   Other Shoulder Stretches  ER stretch in doorway R UE- 2320 sec with PT OP pt w/ c/o pain in shoulder      Manual Therapy   Manual Therapy  Passive ROM;Joint mobilization     Manual therapy comments  patient hooklying    Joint Mobilization  R shoulder mobs- inferior, anterior, posterior, long arm distraction grade III/IV    Soft tissue mobilization  R pec, bicep, bicep tendon; R bicep tendon TTP    Passive ROM  PROM all planes to tolerance               PT Short Term Goals - 01/18/18 1709      PT SHORT TERM GOAL #1   Title  patient to be independent with initial HEP    Time  4    Period  Weeks    Status  Achieved        PT Long Term Goals - 01/16/18 1618      PT LONG TERM GOAL #1   Title  patient to be independent with advanced HEP    Time  8    Period  Weeks    Status  On-going      PT LONG TERM GOAL #2   Title  patient to demonstrate R shoulder AROM to WNL without pain limiting    Time  8    Period  Weeks    Status  On-going      PT LONG TERM GOAL #3   Title  patient to report ability to reach top of head with R UE in order to allow for hair dressing.    Time  8    Period  Weeks    Status  On-going      PT LONG TERM GOAL #4   Title  patient to demonstrate R shoulder strength to >/= 4/5    Time  8    Period  Weeks    Status  On-going            Plan - 02/12/18 1551    Clinical Impression Statement  Patient arrived to session with no new complaints. Able to tolerated R shoulder PROM, jt mobilizations, and STM. Patient with tenderness to R biceps tendon insertion with STM and mild c/o pain at end ranges of PROM. Able to tolerate progressive RTC and periscapular strengthening this date- VC/TCs required to correct posture and scapular retraction throughout. Patient attempted quadruped rocking into B shoulder flexion- difficulty getting into this position and to end range flexion. Patient with c/o pain with doorway ER stretch with PT OP- improved after easing off. Patient with no c/o pain at end of session.    PT Treatment/Interventions  ADLs/Self Care Home Management;Cryotherapy;Electrical Stimulation;Iontophoresis 4mg /ml  Dexamethasone;Moist Heat;Therapeutic exercise;Therapeutic activities;Ultrasound;Neuromuscular re-education;Patient/family education;Manual techniques;Vasopneumatic Device;Taping;Dry  needling;Passive range of motion;Energy conservation;Splinting    Consulted and Agree with Plan of Care  Patient       Patient will benefit from skilled therapeutic intervention in order to improve the following deficits and impairments:  Hypomobility, Decreased activity tolerance, Decreased strength, Impaired UE functional use, Pain, Decreased mobility, Decreased range of motion, Postural dysfunction, Impaired flexibility  Visit Diagnosis: Acute pain of right shoulder  Stiffness of right shoulder, not elsewhere classified  Abnormal posture  Other symptoms and signs involving the musculoskeletal system     Problem List Patient Active Problem List   Diagnosis Date Noted  . Pressure injury of skin 05/01/2017  . Coronary artery disease involving coronary bypass graft of native heart with angina pectoris (HCC) 04/30/2017  . SIRS (systemic inflammatory response syndrome) (HCC) 04/30/2017  . Symptomatic anemia 04/30/2017  . Hyperbilirubinemia 04/30/2017  . AKI (acute kidney injury) (HCC) 04/30/2017  . History of CVA in adulthood 04/30/2017  . Fever in adult   . Hypokalemia   . Jaundice   . Hx of CABG 04/18/2017  . Non-ST elevation (NSTEMI) myocardial infarction (HCC)   . Hyperlipidemia 03/14/2016  . Hypertensive heart disease 12/17/2015  . Diabetes mellitus, type II, insulin dependent (HCC) 01/04/2012  . Hypertension   . Stroke [facility](HCC)      Anette GuarneriYevgeniya Mitsy Owen, PT, DPT 02/12/18 4:00 PM   [facility] [address]  [address] [place], [place], [address] Phone: [phone]   Fax:  [phone]  Name: Jorge Mandrilamela I Tanguma MRN: [id number] Date of Birth: 01-21-1946

## 2018-02-15 ENCOUNTER — Encounter: Payer: Self-pay | Admitting: Physical Therapy

## 2018-02-15 ENCOUNTER — Ambulatory Visit: Payer: Medicare Other | Admitting: Physical Therapy

## 2018-02-15 DIAGNOSIS — R293 Abnormal posture: Secondary | ICD-10-CM

## 2018-02-15 DIAGNOSIS — M25611 Stiffness of right shoulder, not elsewhere classified: Secondary | ICD-10-CM

## 2018-02-15 DIAGNOSIS — M25511 Pain in right shoulder: Secondary | ICD-10-CM

## 2018-02-15 DIAGNOSIS — R29898 Other symptoms and signs involving the musculoskeletal system: Secondary | ICD-10-CM

## 2018-02-15 NOTE — Therapy (Signed)
[facility] [address]  [place], [place], [address] Phone: [phone]   Fax:  [phone]  Physical Therapy Treatment  Patient Details  Name: Andrea [name] MRN: [id number] Date of Birth: [birth year] Referring Provider: Rhina Brackett, MD   Encounter Date: 02/15/2018  PT End of Session - 02/15/18 1701    Visit Number  9    Number of Visits  17    Date for PT Re-Evaluation  03/05/18    Authorization Type  [employer] Medicare    PT Start Time  1318    PT Stop Time  1358    PT Time Calculation (min)  40 min    Activity Tolerance  Patient tolerated treatment well;Patient limited by pain    Behavior During Therapy  [facility] for tasks assessed/performed       Past Medical History:  Diagnosis Date  . Anemia 04/30/2017  . CHF (congestive heart failure) ([place])   . Diabetes mellitus   . Hypertension   . Hypokalemia 04/2017  . Macular degeneration disease    loss of central vision on left and decreased peripheral vision.   . Stroke ([place]) 12/2011   no residuals noted    Past Surgical History:  Procedure Laterality Date  . CHOLECYSTECTOMY    . CORONARY ARTERY BYPASS GRAFT N/A 04/18/2017   Procedure: CORONARY ARTERY BYPASS GRAFTING (CABG) times four using left internal mammary artery and right leg saphenous vein;  Surgeon: Ivin Poot, MD;  Location: [place];  Service: Open Heart Surgery;  Laterality: N/A;  . LEFT HEART CATH AND CORONARY ANGIOGRAPHY N/A 04/17/2017   Procedure: LEFT HEART CATH AND CORONARY ANGIOGRAPHY;  Surgeon: Leonie Man, MD;  Location: [place] [facility];  Service: Cardiovascular;  Laterality: N/A;  . ROTATOR CUFF REPAIR     left  . TEE WITHOUT CARDIOVERSION N/A 04/18/2017   Procedure: TRANSESOPHAGEAL ECHOCARDIOGRAM (TEE);  Surgeon: Prescott Gum, Collier Salina, MD;  Location: [place];  Service: Open Heart Surgery;  Laterality: N/A;    There were no vitals filed for this visit.  Subjective Assessment -  02/15/18 1320    Subjective  Reports she checked BG at 1:00 and it was 155. Reports she is having a rough day because she hasn't "had time for me."    Pertinent History  CABG x4, DM, HTN, stroke, L RTC repair    Diagnostic tests  per patient, xray on R shoulder    Patient Stated Goals  want to get my range of motion back without pain, being able to do my hair    Currently in Pain?  Yes    Pain Score  3     Pain Location  Shoulder    Pain Orientation  Right    Pain Descriptors / Indicators  Aching    Pain Type  Chronic pain                       [facility] Adult PT Treatment/Exercise - 02/15/18 0001      Shoulder Exercises: Supine   Protraction  Strengthening;Both;Weights;10 reps;Limitations    Protraction Weight (lbs)  3#    Protraction Limitations  assistance required to lift R UE up to ceiling      Shoulder Exercises: Sidelying   External Rotation  Strengthening;Right;Weights;Limitations;15 reps    External Rotation Weight (lbs)  2#    External Rotation Limitations  2x10; dowel under elbow    Flexion  AROM;Right;10 reps;Limitations  Flexion Limitations  palm down; c/o catching in shoulder which dissipated    ABduction  Right;AROM;10 reps;Limitations    ABduction Limitations  2x10 thumb up; report of popping in shoulder- advised to avoid shoulder IR      Shoulder Exercises: Standing   Flexion  AAROM;Both;10 reps;Limitations    Flexion Limitations  rolling ball up wall into flexion; 10x    Other Standing Exercises  resisted R UE scaption w/ yellow TB; 10x limited range      Shoulder Exercises: Pulleys   Flexion  3 minutes    ABduction  3 minutes      Manual Therapy   Manual Therapy  Passive ROM;Joint mobilization;Muscle Energy Technique    Manual therapy comments  patient hooklying    Joint Mobilization  R shoulder mobs- inferior, anterior, posterior, long arm distraction grade III/IV    Passive ROM  PROM all planes to tolerance    Muscle Energy Technique  R  shoulder MET for flexion, IR, ER 3x5" hold and prolonged stretch             PT Education - 02/15/18 1700    Education Details  Addition to HEP    Person(s) Educated  Patient    Methods  Explanation;Demonstration;Tactile cues;Verbal cues;Handout    Comprehension  Returned demonstration;Verbalized understanding       PT Short Term Goals - 01/18/18 1709      PT SHORT TERM GOAL #1   Title  patient to be independent with initial HEP    Time  4    Period  Weeks    Status  Achieved        PT Long Term Goals - 01/16/18 1618      PT LONG TERM GOAL #1   Title  patient to be independent with advanced HEP    Time  8    Period  Weeks    Status  On-going      PT LONG TERM GOAL #2   Title  patient to demonstrate R shoulder AROM to WNL without pain limiting    Time  8    Period  Weeks    Status  On-going      PT LONG TERM GOAL #3   Title  patient to report ability to reach top of head with R UE in order to allow for hair dressing.    Time  8    Period  Weeks    Status  On-going      PT LONG TERM GOAL #4   Title  patient to demonstrate R shoulder strength to >/= 4/5    Time  8    Period  Weeks    Status  On-going            Plan - 02/15/18 1701    Clinical Impression Statement  Patient arrived to session with no new complaints. Tolerated PROM to R shoulder with muscle guarding and mild c/o pain at end range- mildly improved after METs for flexion, IR, ER. Patient tolerated increase in weight for supine serratus punches however required assistance in bringing R UE up in preparation for exercise.  Able to perform sidelying ER and ABD with good form- slightly more difficulty with flexion. Added these exercises to HEP and administered handout; patient reported understanding. Ended session without c/o pain.    PT Treatment/Interventions  ADLs/Self Care Home Management;Cryotherapy;Electrical Stimulation;Iontophoresis '4mg'$ /ml Dexamethasone;Moist Heat;Therapeutic  exercise;Therapeutic activities;Ultrasound;Neuromuscular re-education;Patient/family education;Manual techniques;Vasopneumatic Device;Taping;Dry needling;Passive range of motion;Energy conservation;Splinting  Consulted and Agree with Plan of Care  Patient       Patient will benefit from skilled therapeutic intervention in order to improve the following deficits and impairments:  Hypomobility, Decreased activity tolerance, Decreased strength, Impaired UE functional use, Pain, Decreased mobility, Decreased range of motion, Postural dysfunction, Impaired flexibility  Visit Diagnosis: Acute pain of right shoulder  Stiffness of right shoulder, not elsewhere classified  Abnormal posture  Other symptoms and signs involving the musculoskeletal system     Problem List Patient Active Problem List   Diagnosis Date Noted  . Pressure injury of skin 05/01/2017  . Coronary artery disease involving coronary bypass graft of native heart with angina pectoris ([place]) 04/30/2017  . SIRS (systemic inflammatory response syndrome) ([place]) 04/30/2017  . Symptomatic anemia 04/30/2017  . Hyperbilirubinemia 04/30/2017  . AKI (acute kidney injury) ([place]) 04/30/2017  . History of CVA in adulthood 04/30/2017  . Fever in adult   . Hypokalemia   . Jaundice   . Hx of CABG 04/18/2017  . Non-ST elevation (NSTEMI) myocardial infarction ([place])   . Hyperlipidemia 03/14/2016  . Hypertensive heart disease 12/17/2015  . Diabetes mellitus, type II, insulin dependent ([place]) 01/04/2012  . Hypertension   . Stroke [facility])     Janene Harvey, PT, DPT 02/15/18 5:58 PM   [facility] [address]  [place], [place], [address] Phone: [phone]   Fax:  [phone]  Name: Andrea [name] MRN: [id number] Date of Birth: 1946-02-26

## 2018-02-19 ENCOUNTER — Ambulatory Visit: Payer: Medicare Other | Admitting: Physical Therapy

## 2018-02-19 ENCOUNTER — Encounter: Payer: Self-pay | Admitting: Physical Therapy

## 2018-02-19 DIAGNOSIS — M25511 Pain in right shoulder: Secondary | ICD-10-CM | POA: Diagnosis not present

## 2018-02-19 DIAGNOSIS — M25611 Stiffness of right shoulder, not elsewhere classified: Secondary | ICD-10-CM

## 2018-02-19 DIAGNOSIS — R29898 Other symptoms and signs involving the musculoskeletal system: Secondary | ICD-10-CM

## 2018-02-19 DIAGNOSIS — R293 Abnormal posture: Secondary | ICD-10-CM

## 2018-02-19 NOTE — Therapy (Signed)
[facility] [address]  [place], [place], [address] Phone: [phone]   Fax:  [phone]  Physical Therapy Progress Note  Patient Details  Name: Andrea [name] MRN: [id number] Date of Birth: 24-May-1946 Referring Provider: Rhina Brackett, MD   Progress Note Reporting Period 01/08/18 to 02/19/18  See note below for Objective Data and Assessment of Progress/Goals.    Encounter Date: 02/19/2018  PT End of Session - 02/19/18 1409    Visit Number  10    Number of Visits  17    Date for PT Re-Evaluation  03/05/18    Authorization Type  [employer]HC Medicare    PT Start Time  1316    PT Stop Time  1402    PT Time Calculation (min)  46 min    Activity Tolerance  Patient tolerated treatment well;Patient limited by pain    Behavior During Therapy  WFL for tasks assessed/performed       Past Medical History:  Diagnosis Date  . Anemia 04/30/2017  . CHF (congestive heart failure) ([place])   . Diabetes mellitus   . Hypertension   . Hypokalemia 04/2017  . Macular degeneration disease    loss of central vision on left and decreased peripheral vision.   . Stroke ([place]) 12/2011   no residuals noted    Past Surgical History:  Procedure Laterality Date  . CHOLECYSTECTOMY    . CORONARY ARTERY BYPASS GRAFT N/A 04/18/2017   Procedure: CORONARY ARTERY BYPASS GRAFTING (CABG) times four using left internal mammary artery and right leg saphenous vein;  Surgeon: Ivin Poot, MD;  Location: [place];  Service: Open Heart Surgery;  Laterality: N/A;  . LEFT HEART CATH AND CORONARY ANGIOGRAPHY N/A 04/17/2017   Procedure: LEFT HEART CATH AND CORONARY ANGIOGRAPHY;  Surgeon: Leonie Man, MD;  Location: [place] [facility];  Service: Cardiovascular;  Laterality: N/A;  . ROTATOR CUFF REPAIR     left  . TEE WITHOUT CARDIOVERSION N/A 04/18/2017   Procedure: TRANSESOPHAGEAL ECHOCARDIOGRAM (TEE);  Surgeon: Prescott Gum, Collier Salina, MD;  Location: [place];  Service: Open Heart Surgery;  Laterality: N/A;    There were no vitals filed for this visit.  Subjective Assessment - 02/19/18 1318    Subjective  Patient reports she hasn't been able to check her BG- left it at home. Took insulin 30 min ago. Reports 60-70% improvement since initial eval. Reports difficulty reaching into washer/dryer.  Reports pain levels have improved and is taking less pain meds as a result; also easier for her to drive- rotating wheel and reaching for gears    Pertinent History  CABG x4, DM, HTN, stroke, L RTC repair    Diagnostic tests  per patient, xray on R shoulder    Patient Stated Goals  want to get my range of motion back without pain, being able to do my hair    Currently in Pain?  Yes    Pain Score  2     Pain Location  Shoulder    Pain Orientation  Right    Pain Descriptors / Indicators  Aching    Pain Type  Chronic pain         [facility] PT Assessment - 02/19/18 1327      Observation/Other Assessments   Focus on Therapeutic Outcomes (FOTO)   Shoulder: 60 (40% limited, 39% predicted)      AROM   AROM Assessment Site  Shoulder    Right/Left Shoulder  Right    Right Shoulder Flexion  99 Degrees    Right Shoulder ABduction  63 Degrees    Right Shoulder Internal Rotation  50 Degrees    Right Shoulder External Rotation  10 Degrees      PROM   Right Shoulder Flexion  115 Degrees    Right Shoulder ABduction  105 Degrees    Right Shoulder Internal Rotation  71 Degrees    Right Shoulder External Rotation  12 Degrees      Strength   Strength Assessment Site  Shoulder    Right/Left Shoulder  Right;Left    Right Shoulder Flexion  4/5    Right Shoulder ABduction  3+/5    Right Shoulder Internal Rotation  4/5    Right Shoulder External Rotation  3+/5                   [facility] Adult PT Treatment/Exercise - 02/19/18 0001      Shoulder Exercises: Supine   Protraction  Strengthening;Both;Weights;10 reps;Limitations    Protraction Weight (lbs)   3#      Shoulder Exercises: Seated   Flexion  Strengthening;Right;10 reps;Weights    Flexion Weight (lbs)  1#    Flexion Limitations  R UE scaption with 1#; cues to avoid shoulder elevation      Shoulder Exercises: Sidelying   External Rotation  Strengthening;Right;Weights;Limitations;15 reps    External Rotation Weight (lbs)  2#    External Rotation Limitations  2x10; dowel under elbow    ABduction  Right;AROM;10 reps;Limitations    ABduction Limitations  2x10 thumb up; 10x 0#; 10x 1#      Manual Therapy   Manual Therapy  Passive ROM;Joint mobilization;Muscle Energy Technique    Manual therapy comments  patient hooklying    Joint Mobilization  R shoulder mobs- inferior, anterior, posterior, long arm distraction grade III/IV    Soft tissue mobilization  R bicep tendon and bicep muscle belly- tender with large soft tissue restriction/trigger pt    Passive ROM  PROM all planes to tolerance    Muscle Energy Technique  R shoulder MET for IR, ER 3x5" hold and prolonged stretch               PT Short Term Goals - 02/19/18 1325      PT SHORT TERM GOAL #1   Title  patient to be independent with initial HEP    Time  4    Period  Weeks    Status  Achieved        PT Long Term Goals - 02/19/18 1325      PT LONG TERM GOAL #1   Title  patient to be independent with advanced HEP    Time  8    Period  Weeks    Status  On-going met for current      PT LONG TERM GOAL #2   Title  patient to demonstrate R shoulder AROM to WNL without pain limiting    Time  8    Period  Weeks    Status  On-going still limited- improvements in IR/ER      PT LONG TERM GOAL #3   Title  patient to report ability to reach top of head with R UE in order to allow for hair dressing.    Time  8    Period  Weeks    Status  Partially Met able to reach top of head with R UE, but with difficulty and compensations  at neck      PT LONG TERM GOAL #4   Title  patient to demonstrate R shoulder strength to  >/= 4/5    Time  8    Period  Weeks    Status  Partially Met improvement in R IR strength            Plan - 02/19/18 1409    Clinical Impression Statement  Patient arrived to session with report of 60-70% improvement in R shoulder since initial eval. Reports remaining difficulty reaching forward into washer/dryer; is using tongs in order to reach laundry at this time. Reports improvements in pain levels and taking less pain meds as a result; also easier for her to drive- rotating wheel and reaching for gears. Updated goals this date- patient showing slow progress in R shoulder PROM/AROM despite reporting compliance with HEP. Has shown improvement in R IR strength and able to reach top of head with R UE- albeit with compensations at head/neck. Tolerated R shoulder PROM, gentle joint mobs, and STM- patient remains guarded and with intermittent muscle spasms of bicep during PROM. ROM improvement noted after performing METs for IR/ER. Patient with significant soft tissue restriction and tenderness in R bicep muscle belly this date; mild improvement after STM. Added 1# weight to sidelying abduction and sitting scaption with good form this date. Completed session without c/o pain. Patient has been reporting subjective improvements in functional progress, still limited in some ADLs d/t motion restriction. Will continue to benefit from skilled PT services to address these ROM and strength deficits. Patient in agreement.     PT Treatment/Interventions  ADLs/Self Care Home Management;Cryotherapy;Electrical Stimulation;Iontophoresis '4mg'$ /ml Dexamethasone;Moist Heat;Therapeutic exercise;Therapeutic activities;Ultrasound;Neuromuscular re-education;Patient/family education;Manual techniques;Vasopneumatic Device;Taping;Dry needling;Passive range of motion;Energy conservation;Splinting    PT Next Visit Plan  reassess sidelying ABD with 1#, sitting scaption with 1#    Consulted and Agree with Plan of Care  Patient        Patient will benefit from skilled therapeutic intervention in order to improve the following deficits and impairments:  Hypomobility, Decreased activity tolerance, Decreased strength, Impaired UE functional use, Pain, Decreased mobility, Decreased range of motion, Postural dysfunction, Impaired flexibility  Visit Diagnosis: Acute pain of right shoulder  Stiffness of right shoulder, not elsewhere classified  Abnormal posture  Other symptoms and signs involving the musculoskeletal system     Problem List Patient Active Problem List   Diagnosis Date Noted  . Pressure injury of skin 05/01/2017  . Coronary artery disease involving coronary bypass graft of native heart with angina pectoris ([place]) 04/30/2017  . SIRS (systemic inflammatory response syndrome) ([place]) 04/30/2017  . Symptomatic anemia 04/30/2017  . Hyperbilirubinemia 04/30/2017  . AKI (acute kidney injury) ([place]) 04/30/2017  . History of CVA in adulthood 04/30/2017  . Fever in adult   . Hypokalemia   . Jaundice   . Hx of CABG 04/18/2017  . Non-ST elevation (NSTEMI) myocardial infarction ([place])   . Hyperlipidemia 03/14/2016  . Hypertensive heart disease 12/17/2015  . Diabetes mellitus, type II, insulin dependent ([place]) 01/04/2012  . Hypertension   . Stroke [facility])      Janene Harvey, PT, DPT 02/19/18 2:19 PM   [facility] [address]  [place], [place], [address] Phone: [phone]   Fax:  [phone]  Name: DOMINO HOLTEN MRN: [id number] Date of Birth: 1946/03/16

## 2018-02-22 ENCOUNTER — Encounter: Payer: Self-pay | Admitting: Physical Therapy

## 2018-02-22 ENCOUNTER — Ambulatory Visit: Payer: Medicare Other | Admitting: Physical Therapy

## 2018-02-22 DIAGNOSIS — M25511 Pain in right shoulder: Secondary | ICD-10-CM | POA: Diagnosis not present

## 2018-02-22 DIAGNOSIS — R29898 Other symptoms and signs involving the musculoskeletal system: Secondary | ICD-10-CM

## 2018-02-22 DIAGNOSIS — M25611 Stiffness of right shoulder, not elsewhere classified: Secondary | ICD-10-CM

## 2018-02-22 DIAGNOSIS — R293 Abnormal posture: Secondary | ICD-10-CM

## 2018-02-22 NOTE — Therapy (Signed)
[facility] [address]  [place], [place], [address] Phone: [phone]   Fax:  [phone]  Physical Therapy Treatment  Patient Details  Name: Andrea [name] MRN: [id number] Date of Birth: [birth year] Referring Provider: Rhina Brackett, MD   Encounter Date: 02/22/2018  PT End of Session - 02/22/18 1502    Visit Number  11    Number of Visits  17    Date for PT Re-Evaluation  03/05/18    Authorization Type  [employer] Medicare    PT Start Time  1326    PT Stop Time  1400    PT Time Calculation (min)  34 min    Activity Tolerance  Patient tolerated treatment well    Behavior During Therapy  [facility] for tasks assessed/performed       Past Medical History:  Diagnosis Date  . Anemia 04/30/2017  . CHF (congestive heart failure) ([place])   . Diabetes mellitus   . Hypertension   . Hypokalemia 04/2017  . Macular degeneration disease    loss of central vision on left and decreased peripheral vision.   . Stroke ([place]) 12/2011   no residuals noted    Past Surgical History:  Procedure Laterality Date  . CHOLECYSTECTOMY    . CORONARY ARTERY BYPASS GRAFT N/A 04/18/2017   Procedure: CORONARY ARTERY BYPASS GRAFTING (CABG) times four using left internal mammary artery and right leg saphenous vein;  Surgeon: Ivin Poot, MD;  Location: [place];  Service: Open Heart Surgery;  Laterality: N/A;  . LEFT HEART CATH AND CORONARY ANGIOGRAPHY N/A 04/17/2017   Procedure: LEFT HEART CATH AND CORONARY ANGIOGRAPHY;  Surgeon: Leonie Man, MD;  Location: [place] [facility];  Service: Cardiovascular;  Laterality: N/A;  . ROTATOR CUFF REPAIR     left  . TEE WITHOUT CARDIOVERSION N/A 04/18/2017   Procedure: TRANSESOPHAGEAL ECHOCARDIOGRAM (TEE);  Surgeon: Prescott Gum, Collier Salina, MD;  Location: [place];  Service: Open Heart Surgery;  Laterality: N/A;    There were no vitals filed for this visit.  Subjective Assessment - 02/22/18 1326    Subjective   Patient reports she saw MD who told her to discontinue with PT within the next 2 visits. Advised her to continue with fitness regiment at home. BG 126 at beginning of session.    Pertinent History  CABG x4, DM, HTN, stroke, L RTC repair    Diagnostic tests  per patient, xray on R shoulder    Patient Stated Goals  want to get my range of motion back without pain, being able to do my hair    Currently in Pain?  No/denies                       [facility] Adult PT Treatment/Exercise - 02/22/18 0001      Shoulder Exercises: Supine   Protraction  Strengthening;Both;Weights;10 reps;Limitations    Protraction Weight (lbs)  3#    Protraction Limitations  assistance required to lift R UE up to ceiling      Shoulder Exercises: Seated   Row  Strengthening;Both;15 reps;Theraband    Theraband Level (Shoulder Row)  Level 2 (Red)    Horizontal ABduction  Strengthening;Both;15 reps;Theraband;Limitations    Theraband Level (Shoulder Horizontal ABduction)  Level 1 (Yellow)    Horizontal ABduction Limitations  VCs for scap retraction    Flexion  Strengthening;Right;10 reps;Weights    Flexion Weight (lbs)  1#    Flexion Limitations  R UE scaption with 1#; cues to avoid shoulder elevation      Shoulder Exercises: Sidelying   External Rotation  Strengthening;Right;Weights;Limitations;15 reps    External Rotation Weight (lbs)  2#    External Rotation Limitations  2x10; dowel under elbow; VCs for full ROM    ABduction  Right;AROM;10 reps;Limitations    ABduction Limitations  2x10 thumb up; 10x 0#; 10x 1#      Shoulder Exercises: Standing   External Rotation  Right;Strengthening;Limitations;Theraband;15 reps    Theraband Level (Shoulder External Rotation)  Level 1 (Yellow)    External Rotation Limitations  dowel rod under elbow    Internal Rotation  Strengthening;Right;15 reps;Theraband;Limitations    Theraband Level (Shoulder Internal Rotation)  Level 1 (Yellow)    Internal Rotation  Limitations  dowel rod under elbow    Flexion  AAROM;Both;Limitations;5 reps    Flexion Limitations  rolling ball up wall into flexion      Shoulder Exercises: ROM/Strengthening   UBE (Upper Arm Bike)  L1 3/3               PT Short Term Goals - 02/19/18 1325      PT SHORT TERM GOAL #1   Title  patient to be independent with initial HEP    Time  4    Period  Weeks    Status  Achieved        PT Long Term Goals - 02/19/18 1325      PT LONG TERM GOAL #1   Title  patient to be independent with advanced HEP    Time  8    Period  Weeks    Status  On-going met for current      PT LONG TERM GOAL #2   Title  patient to demonstrate R shoulder AROM to WNL without pain limiting    Time  8    Period  Weeks    Status  On-going still limited- improvements in IR/ER      PT LONG TERM GOAL #3   Title  patient to report ability to reach top of head with R UE in order to allow for hair dressing.    Time  8    Period  Weeks    Status  Partially Met able to reach top of head with R UE, but with difficulty and compensations at neck      PT LONG TERM GOAL #4   Title  patient to demonstrate R shoulder strength to >/= 4/5    Time  8    Period  Weeks    Status  Partially Met improvement in R IR strength            Plan - 02/22/18 1503    Clinical Impression Statement  Patient arrived late to session. Reports that she saw MD who told her to wrap up with PT within the next 2 sessions. Encouraged patient to continue with fitness regimen and HEP- patient agreeable. Good tolerance of UBE this date with more difficulty in backwards direction. Tolerated all periscapular and RTC strengthening ther-ex without c/o pain. Patient still requiring cues to avoid shoulder elevation compensations and remember scapular retraction. Ended session without c/o pain. Plan to update patient's HEP and d/c to home program within coming sessions. Patient agreeable.     PT Treatment/Interventions  ADLs/Self  Care Home Management;Cryotherapy;Electrical Stimulation;Iontophoresis '4mg'$ /ml Dexamethasone;Moist Heat;Therapeutic exercise;Therapeutic activities;Ultrasound;Neuromuscular re-education;Patient/family education;Manual techniques;Vasopneumatic Device;Taping;Dry needling;Passive range of motion;Energy conservation;Splinting    Consulted and Agree with Plan  of Care  Patient       Patient will benefit from skilled therapeutic intervention in order to improve the following deficits and impairments:  Hypomobility, Decreased activity tolerance, Decreased strength, Impaired UE functional use, Pain, Decreased mobility, Decreased range of motion, Postural dysfunction, Impaired flexibility  Visit Diagnosis: Acute pain of right shoulder  Stiffness of right shoulder, not elsewhere classified  Abnormal posture  Other symptoms and signs involving the musculoskeletal system     Problem List Patient Active Problem List   Diagnosis Date Noted  . Pressure injury of skin 05/01/2017  . Coronary artery disease involving coronary bypass graft of native heart with angina pectoris ([place]) 04/30/2017  . SIRS (systemic inflammatory response syndrome) ([place]) 04/30/2017  . Symptomatic anemia 04/30/2017  . Hyperbilirubinemia 04/30/2017  . AKI (acute kidney injury) ([place]) 04/30/2017  . History of CVA in adulthood 04/30/2017  . Fever in adult   . Hypokalemia   . Jaundice   . Hx of CABG 04/18/2017  . Non-ST elevation (NSTEMI) myocardial infarction ([place])   . Hyperlipidemia 03/14/2016  . Hypertensive heart disease 12/17/2015  . Diabetes mellitus, type II, insulin dependent ([place]) 01/04/2012  . Hypertension   . Stroke [facility])     Janene Harvey, PT, DPT 02/22/18 3:07 PM   [facility] [address]  [place], [place], [address] Phone: [phone]   Fax:  [phone]  Name: Andrea [name] MRN: [id number] Date of Birth: Jul 17, 1946

## 2018-02-26 ENCOUNTER — Encounter: Payer: Self-pay | Admitting: Physical Therapy

## 2018-02-26 ENCOUNTER — Ambulatory Visit: Payer: Medicare Other | Admitting: Physical Therapy

## 2018-02-26 DIAGNOSIS — R29898 Other symptoms and signs involving the musculoskeletal system: Secondary | ICD-10-CM

## 2018-02-26 DIAGNOSIS — M25611 Stiffness of right shoulder, not elsewhere classified: Secondary | ICD-10-CM

## 2018-02-26 DIAGNOSIS — R293 Abnormal posture: Secondary | ICD-10-CM

## 2018-02-26 DIAGNOSIS — M25511 Pain in right shoulder: Secondary | ICD-10-CM

## 2018-02-26 NOTE — Therapy (Signed)
[facility] [address]  [place], [place], [address] Phone: [phone]   Fax:  [phone]  Physical Therapy Treatment  Patient Details  Name: Andrea [name] MRN: [id number] Date of Birth: 12/20/1945 Referring Provider: Rhina Brackett, MD   Encounter Date: 02/26/2018  PT End of Session - 02/26/18 1702    Visit Number  12    Number of Visits  17    Date for PT Re-Evaluation  03/05/18    Authorization Type  [employer] Medicare    PT Start Time  1318    PT Stop Time  1405    PT Time Calculation (min)  47 min    Activity Tolerance  Patient tolerated treatment well    Behavior During Therapy  [employer] for tasks assessed/performed       Past Medical History:  Diagnosis Date  . Anemia 04/30/2017  . CHF (congestive heart failure) ([place])   . Diabetes mellitus   . Hypertension   . Hypokalemia 04/2017  . Macular degeneration disease    loss of central vision on left and decreased peripheral vision.   . Stroke ([place]) 12/2011   no residuals noted    Past Surgical History:  Procedure Laterality Date  . CHOLECYSTECTOMY    . CORONARY ARTERY BYPASS GRAFT N/A 04/18/2017   Procedure: CORONARY ARTERY BYPASS GRAFTING (CABG) times four using left internal mammary artery and right leg saphenous vein;  Surgeon: Ivin Poot, MD;  Location: [place];  Service: Open Heart Surgery;  Laterality: N/A;  . LEFT HEART CATH AND CORONARY ANGIOGRAPHY N/A 04/17/2017   Procedure: LEFT HEART CATH AND CORONARY ANGIOGRAPHY;  Surgeon: Leonie Man, MD;  Location: [place] [facility];  Service: Cardiovascular;  Laterality: N/A;  . ROTATOR CUFF REPAIR     left  . TEE WITHOUT CARDIOVERSION N/A 04/18/2017   Procedure: TRANSESOPHAGEAL ECHOCARDIOGRAM (TEE);  Surgeon: Prescott Gum, Collier Salina, MD;  Location: [place];  Service: Open Heart Surgery;  Laterality: N/A;    There were no vitals filed for this visit.  Subjective Assessment - 02/26/18 1320    Subjective   Reports R shoulder has been sore over the weekend. Pulled on a heavy hose to hose down her lawnmower. Took BG before session- 151.    Pertinent History  CABG x4, DM, HTN, stroke, L RTC repair    Diagnostic tests  per patient, xray on R shoulder    Patient Stated Goals  want to get my range of motion back without pain, being able to do my hair    Currently in Pain?  Yes    Pain Score  1     Pain Location  Shoulder    Pain Orientation  Right    Pain Descriptors / Indicators  Aching    Pain Type  Chronic pain                       [facility] Adult PT Treatment/Exercise - 02/26/18 0001      Shoulder Exercises: Supine   Protraction  Strengthening;Both;Weights;10 reps;Limitations    Protraction Weight (lbs)  3#    External Rotation  Strengthening;Right;10 reps;Weights;Limitations    External Rotation Weight (lbs)  2#    External Rotation Limitations  2x10    ABduction  Right;Strengthening;10 reps;Weights;Limitations    Shoulder ABduction Weight (lbs)  1#    ABduction Limitations  thumb up    Other Supine Exercises  R UE scaption to tolerance  10x with pillow underneath R UE to avoid pain      Shoulder Exercises: Seated   Flexion  10 reps;Both;AAROM    Flexion Limitations  rolling orange pball into flexion    Abduction  AAROM;Both;10 reps;Limitations    ABduction Limitations  rolling orange pball into abduction      Shoulder Exercises: Standing   Horizontal ABduction  Strengthening;Both;10 reps;Theraband;Limitations    Theraband Level (Shoulder Horizontal ABduction)  Level 1 (Yellow)    Horizontal ABduction Limitations  cues for scap retraction      Shoulder Exercises: ROM/Strengthening   UBE (Upper Arm Bike)  L 3/3      Manual Therapy   Manual Therapy  Passive ROM;Joint mobilization;Muscle Energy Technique    Manual therapy comments  patient hooklying    Joint Mobilization  R shoulder mobs- inferior, anterior, posterior, long arm distraction grade III/IV    Soft tissue  mobilization  R bicep tendon and bicep muscle belly- soft tissue restriction and TTP in these areas    Passive ROM  PROM all planes to tolerance             PT Education - 02/26/18 1702    Education Details  Updated HEP; edu on using physioball and TBs for exercises; advised not to push into pain    Person(s) Educated  Patient    Methods  Explanation;Demonstration;Tactile cues;Verbal cues;Handout    Comprehension  Returned demonstration;Verbalized understanding       PT Short Term Goals - 02/19/18 1325      PT SHORT TERM GOAL #1   Title  patient to be independent with initial HEP    Time  4    Period  Weeks    Status  Achieved        PT Long Term Goals - 02/19/18 1325      PT LONG TERM GOAL #1   Title  patient to be independent with advanced HEP    Time  8    Period  Weeks    Status  On-going met for current      PT LONG TERM GOAL #2   Title  patient to demonstrate R shoulder AROM to WNL without pain limiting    Time  8    Period  Weeks    Status  On-going still limited- improvements in IR/ER      PT LONG TERM GOAL #3   Title  patient to report ability to reach top of head with R UE in order to allow for hair dressing.    Time  8    Period  Weeks    Status  Partially Met able to reach top of head with R UE, but with difficulty and compensations at neck      PT LONG TERM GOAL #4   Title  patient to demonstrate R shoulder strength to >/= 4/5    Time  8    Period  Weeks    Status  Partially Met improvement in R IR strength            Plan - 02/26/18 1703    Clinical Impression Statement  Patient arrived to session with report of R shoulder soreness after pulling on a watering hose over the weekend. Tolerated R shoulder PROM, gentle joint mobilizations, and STM to R bicep. Patient still with soft tissue restriction and tenderness to R bicep. Tolerated progressive RTC and periscapular strengthening exercises without c/o pain. Tolerated R shoulder ER stretch  in doorway; PT  assistance required to keep elbow by side. Advised not to push into pain with this stretch when performing at home. Updated HEP and educated patient on performing these exercises at home for preparation for D/C to HEP. Patient reported understanding. Plan to D/C to HEP next session. Patient in agreement.     PT Treatment/Interventions  ADLs/Self Care Home Management;Cryotherapy;Electrical Stimulation;Iontophoresis 31m/ml Dexamethasone;Moist Heat;Therapeutic exercise;Therapeutic activities;Ultrasound;Neuromuscular re-education;Patient/family education;Manual techniques;Vasopneumatic Device;Taping;Dry needling;Passive range of motion;Energy conservation;Splinting    PT Next Visit Plan  d/c to HEP next visit    Consulted and Agree with Plan of Care  Patient       Patient will benefit from skilled therapeutic intervention in order to improve the following deficits and impairments:  Hypomobility, Decreased activity tolerance, Decreased strength, Impaired UE functional use, Pain, Decreased mobility, Decreased range of motion, Postural dysfunction, Impaired flexibility  Visit Diagnosis: Acute pain of right shoulder  Stiffness of right shoulder, not elsewhere classified  Abnormal posture  Other symptoms and signs involving the musculoskeletal system     Problem List Patient Active Problem List   Diagnosis Date Noted  . Pressure injury of skin 05/01/2017  . Coronary artery disease involving coronary bypass graft of [place] heart with angina pectoris ([place] 04/30/2017  . SIRS (systemic inflammatory response syndrome) ([place] 04/30/2017  . Symptomatic anemia 04/30/2017  . Hyperbilirubinemia 04/30/2017  . AKI (acute kidney injury) ([place] 04/30/2017  . History of CVA in adulthood 04/30/2017  . Fever in adult   . Hypokalemia   . Jaundice   . Hx of CABG 04/18/2017  . Non-ST elevation (NSTEMI) myocardial infarction ([place]   . Hyperlipidemia 03/14/2016  . Hypertensive heart disease  12/17/2015  . Diabetes mellitus, type II, insulin dependent ([place] 01/04/2012  . Hypertension   . Stroke ([facility]     YJanene Harvey PT, DPT 02/26/18 6:31 PM   [facility] [place] [address]Phone: [phone]  Fax:  [phone] Name: Andrea RANDALMRN: [id number] of Birth: 114-Feb-1947

## 2018-02-28 ENCOUNTER — Telehealth: Payer: Self-pay | Admitting: Interventional Cardiology

## 2018-02-28 NOTE — Telephone Encounter (Signed)
Spoke with pt and made her aware ok to use Premarin.  Pt appreciative for call.

## 2018-02-28 NOTE — Telephone Encounter (Signed)
Will route to PharmD to see if Premarin contraindicated.

## 2018-02-28 NOTE — Telephone Encounter (Signed)
New Message      Pt c/o medication issue:  1. Name of Medication: Premarin  2. How are you currently taking this medication (dosage and times per day)? Injection into vignal    3. Are you having a reaction (difficulty breathing--STAT)? No  4. What is your medication issue? Patient is asking if this medication could affect her heart anyway? Pls advise

## 2018-02-28 NOTE — Telephone Encounter (Signed)
Ok to use Premarin with her other medications.

## 2018-03-01 ENCOUNTER — Ambulatory Visit: Payer: Medicare Other | Admitting: Physical Therapy

## 2018-03-01 DIAGNOSIS — M25511 Pain in right shoulder: Secondary | ICD-10-CM

## 2018-03-01 DIAGNOSIS — R293 Abnormal posture: Secondary | ICD-10-CM

## 2018-03-01 DIAGNOSIS — R29898 Other symptoms and signs involving the musculoskeletal system: Secondary | ICD-10-CM

## 2018-03-01 DIAGNOSIS — M25611 Stiffness of right shoulder, not elsewhere classified: Secondary | ICD-10-CM

## 2018-03-01 NOTE — Therapy (Signed)
[facility] [address]  [place], [place], [address] Phone: [phone]   Fax:  [phone]  Physical Therapy Discharge  Patient Details  Name: Andrea [name] MRN: [id number] Date of Birth: 1945/10/04 Referring Provider: Rhina Brackett, MD  Progress Note Reporting Period 02/19/18 to 03/01/18  See note below for Objective Data and Assessment of Progress/Goals.    Encounter Date: 03/01/2018  PT End of Session - 03/01/18 1819    Visit Number  13    Number of Visits  17    Date for PT Re-Evaluation  03/05/18    Authorization Type  [employer] Medicare    PT Start Time  1316    PT Stop Time  1357    PT Time Calculation (min)  41 min    Activity Tolerance  Patient tolerated treatment well;Patient limited by pain;Patient limited by lethargy    Behavior During Therapy  [facility] for tasks assessed/performed       Past Medical History:  Diagnosis Date  . Anemia 04/30/2017  . CHF (congestive heart failure) ([place])   . Diabetes mellitus   . Hypertension   . Hypokalemia 04/2017  . Macular degeneration disease    loss of central vision on left and decreased peripheral vision.   . Stroke ([place]) 12/2011   no residuals noted    Past Surgical History:  Procedure Laterality Date  . CHOLECYSTECTOMY    . CORONARY ARTERY BYPASS GRAFT N/A 04/18/2017   Procedure: CORONARY ARTERY BYPASS GRAFTING (CABG) times four using left internal mammary artery and right leg saphenous vein;  Surgeon: Ivin Poot, MD;  Location: [place];  Service: Open Heart Surgery;  Laterality: N/A;  . LEFT HEART CATH AND CORONARY ANGIOGRAPHY N/A 04/17/2017   Procedure: LEFT HEART CATH AND CORONARY ANGIOGRAPHY;  Surgeon: Leonie Man, MD;  Location: [place] [facility];  Service: Cardiovascular;  Laterality: N/A;  . ROTATOR CUFF REPAIR     left  . TEE WITHOUT CARDIOVERSION N/A 04/18/2017   Procedure: TRANSESOPHAGEAL ECHOCARDIOGRAM (TEE);  Surgeon: Prescott Gum,  Collier Salina, MD;  Location: [place];  Service: Open Heart Surgery;  Laterality: N/A;    There were no vitals filed for this visit.  Subjective Assessment - 03/01/18 1316    Subjective  Took BG before session, 141. Reports she went to eye MD who told her she has retinopathy. Reports 70-75% improvement since intial eval. Still trouble with reaching fully overhead.    Pertinent History  CABG x4, DM, HTN, stroke, L RTC repair    Diagnostic tests  per patient, xray on R shoulder    Patient Stated Goals  want to get my range of motion back without pain, being able to do my hair    Currently in Pain?  Yes    Pain Score  1     Pain Location  Shoulder    Pain Orientation  Right    Pain Descriptors / Indicators  Aching    Pain Type  Chronic pain         [facility] PT Assessment - 03/01/18 0001      Observation/Other Assessments   Focus on Therapeutic Outcomes (FOTO)   Shoulder: 59 (41% limited, 39% predicted)      AROM   AROM Assessment Site  Shoulder    Right/Left Shoulder  Right    Right Shoulder Flexion  82 Degrees    Right Shoulder ABduction  63 Degrees    Right Shoulder Internal Rotation  57 Degrees    Right Shoulder External Rotation  10 Degrees      PROM   PROM Assessment Site  Shoulder    Right/Left Shoulder  Right    Right Shoulder Flexion  115 Degrees    Right Shoulder ABduction  75 Degrees    Right Shoulder Internal Rotation  35 Degrees    Right Shoulder External Rotation  5 Degrees      Strength   Right/Left Shoulder  Right    Right Shoulder Flexion  4/5    Right Shoulder ABduction  3+/5    Right Shoulder Internal Rotation  3+/5    Right Shoulder External Rotation  3+/5                   [facility] Adult PT Treatment/Exercise - 03/01/18 0001      Shoulder Exercises: Sidelying   External Rotation  Strengthening;Right;Weights;Limitations;10 reps    External Rotation Weight (lbs)  2#    External Rotation Limitations  2x10; dowel under elbow; VCs for full ROM     ABduction  Right;AROM;10 reps;Limitations    ABduction Limitations  thumb up      Shoulder Exercises: ROM/Strengthening   UBE (Upper Arm Bike)  L 3/3      Manual Therapy   Manual Therapy  Soft tissue mobilization;Passive ROM    Manual therapy comments  patient hooklying    Soft tissue mobilization  R bicep tendon and bicep muscle belly- soft tissue restriction and TTP in these areas    Passive ROM  PROM all planes to tolerance pt with increased pain and decreased ROM this date             PT Education - 03/01/18 1815    Education Details  advised patient to continue with advanced HEP given last session; patient reported understanding     Person(s) Educated  Patient    Methods  Explanation;Demonstration;Tactile cues;Verbal cues;Handout    Comprehension  Verbalized understanding       PT Short Term Goals - 03/01/18 1323      PT SHORT TERM GOAL #1   Title  patient to be independent with initial HEP    Time  4    Period  Weeks    Status  Achieved        PT Long Term Goals - 03/01/18 1323      PT LONG TERM GOAL #1   Title  patient to be independent with advanced HEP    Time  8    Period  Weeks    Status  Not Met reports she has not been compliant with HEP recently      PT LONG TERM GOAL #2   Title  patient to demonstrate R shoulder AROM to WNL without pain limiting    Time  8    Period  Weeks    Status  Not Met still limited- improvements in IR      PT LONG TERM GOAL #3   Title  patient to report ability to reach top of head with R UE in order to allow for hair dressing.    Time  8    Period  Weeks    Status  Partially Met able to reach top of head with R UE, but with difficulty and compensations at neck      PT LONG TERM GOAL #4   Title  patient to demonstrate R shoulder strength to >/= 4/5    Time  8  Period  Weeks    Status  Partially Met Met for flexion            Plan - 03/01/18 1820    Clinical Impression Statement  Patient arrived to  session with no new shoulder complaints. Reports that she didn't have much to eat and upset about retinopathy diagnosis given to her this AM. BG checked and WNL. Patient reporting 70-75% improvement in R shoulder since initial eval. Reports better able to reach overhead, although still somewhat limited. Updated goals- improvements made in R shoulder AROM this date and able to maintain ABD and ER AROM, flexion PROM, and MMT. However, patient with overall dramatic decrease in R shoulder PROM and AROM this date- patient reporting she has not been performing HEP, noting "I don't have time for myself." Heavy educated patient on importance of continuing HEP and cited previous measurements to show patient that consistency with HEP was giving her benefits. Patient reported understanding. Patient moving slightly slower, requiring assistance with supine>sit transfer, and slightly demotivated at today's session. Re-checked BG during session- 211. Patient tolerated R shoulder PROM and STM and able to perform RTC strengthening but visibly lethargic and trouble seeing d/t getting her eyes dilated this AM. Advised patient to take insulin d/t high BG as directed by MD. Advised patient to continue HEP at home for continued ROM and strength progress. Patient agreeable. Patient to be D/C'd at this time per MD's orders and d/t plateau in patient progress.     PT Treatment/Interventions  ADLs/Self Care Home Management;Cryotherapy;Electrical Stimulation;Iontophoresis 52m/ml Dexamethasone;Moist Heat;Therapeutic exercise;Therapeutic activities;Ultrasound;Neuromuscular re-education;Patient/family education;Manual techniques;Vasopneumatic Device;Taping;Dry needling;Passive range of motion;Energy conservation;Splinting    PT Next Visit Plan  d/c at this time    Consulted and Agree with Plan of Care  Patient       Patient will benefit from skilled therapeutic intervention in order to improve the following deficits and impairments:   Hypomobility, Decreased activity tolerance, Decreased strength, Impaired UE functional use, Pain, Decreased mobility, Decreased range of motion, Postural dysfunction, Impaired flexibility  Visit Diagnosis: Acute pain of right shoulder  Stiffness of right shoulder, not elsewhere classified  Abnormal posture  Other symptoms and signs involving the musculoskeletal system     Problem List Patient Active Problem List   Diagnosis Date Noted  . Pressure injury of skin 05/01/2017  . Coronary artery disease involving coronary bypass graft of native heart with angina pectoris ([place] 04/30/2017  . SIRS (systemic inflammatory response syndrome) ([place] 04/30/2017  . Symptomatic anemia 04/30/2017  . Hyperbilirubinemia 04/30/2017  . AKI (acute kidney injury) ([facility] 04/30/2017  . History of CVA in adulthood 04/30/2017  . Fever in adult   . Hypokalemia   . Jaundice   . Hx of CABG 04/18/2017  . Non-ST elevation (NSTEMI) myocardial infarction ([facility]   . Hyperlipidemia 03/14/2016  . Hypertensive heart disease 12/17/2015  . Diabetes mellitus, type II, insulin dependent ([place] 01/04/2012  . Hypertension   . Stroke ([place]     PHYSICAL THERAPY DISCHARGE SUMMARY  Visits from Start of Care: 13  Current functional level related to goals / functional outcomes: See above   Remaining deficits: Limited AROM/PROM, strength   Education / Equipment: Updated and consolidated HEP  Plan: Patient agrees to discharge.  Patient goals were not met. Patient is being discharged due to the physician's request.  ?????      YJanene Harvey PT, DPT 03/01/18 6:32 PM  [facility] [address] [place]  [place], [place], [address] Phone: [phone]   Fax:  [phone]  Name: Andrea [name] MRN: [id number] Date of Birth: [birth year]

## 2018-03-02 ENCOUNTER — Telehealth: Payer: Self-pay | Admitting: Interventional Cardiology

## 2018-03-02 NOTE — Telephone Encounter (Signed)
Spoke with pt and she was looking for the name of a good psychologist in the area.  Advised I am unsure of any and PCP is actually the best option for this information.  Pt appreciative for call.

## 2018-03-02 NOTE — Telephone Encounter (Signed)
Left message to call back  

## 2018-03-02 NOTE — Telephone Encounter (Signed)
New Message:    Please call, says she would like for you to refer her to personal  Counseling. She says she needs this asap.

## 2018-04-20 ENCOUNTER — Other Ambulatory Visit: Payer: Self-pay | Admitting: Neurology

## 2018-05-31 ENCOUNTER — Telehealth: Payer: Self-pay | Admitting: Interventional Cardiology

## 2018-05-31 NOTE — Telephone Encounter (Signed)
Spoke with pt and she states all week she has been having issues with bilateral ankle swelling.  Went to GYN today and weight was 161lbs and GYN recommended she call PCP about referral to Vascular.  Denies increased salt intake.  Does not wear compression stockings.  Swelling improves at night while she sleeps.  Has occasional SOB with exertion.  Advised pt to elevate feet as much as possible and scheduled her to see Chelsea Aus, PA- C on Monday (1st available).  Pt verbalized understanding and was in agreement with this plan.

## 2018-05-31 NOTE — Telephone Encounter (Signed)
New Message    Pt c/o swelling: STAT is pt has developed SOB within 24 hours  1) How much weight have you gained and in what time span? Unknown   2) If swelling, where is the swelling located? Ankle in both legs  3) Are you currently taking a fluid pill? No   4) Are you currently SOB? Not now but sometimes she feel slightly out of breath  5) Do you have a log of your daily weights (if so, list)? unknowm  6) Have you gained 3 pounds in a day or 5 pounds in a week? unknown  7) Have you traveled recently? No     Patient states that she normal weighs between 156 and 157 but today she weighed 161 at the doctors office. She states that her obgyn that she saw today told her to either follow up with her PCP or cardiologist.

## 2018-05-31 NOTE — Telephone Encounter (Signed)
Left message to call back  

## 2018-06-01 IMAGING — US US RENAL
1 series · 14 of 25 positions shown · non-contrast
Comparison: CT 04/30/2017

CLINICAL DATA: Acute renal injury

EXAM:
RENAL / URINARY TRACT ULTRASOUND COMPLETE

[Series 1: us renal · 0.26mm/px · 14 of 42 slices shown]
[im 1/42]
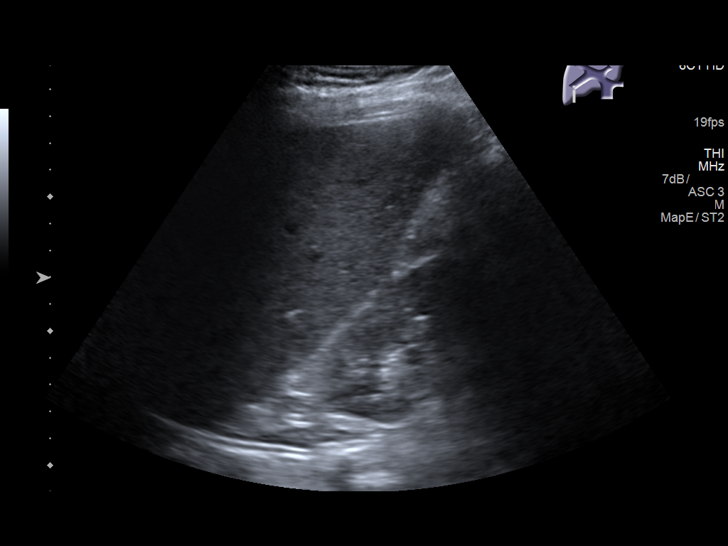
[im 4/42]
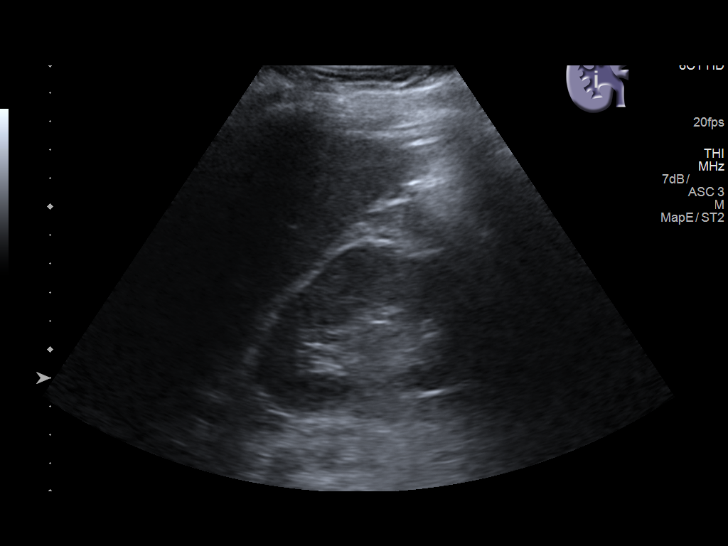
[im 7/42]
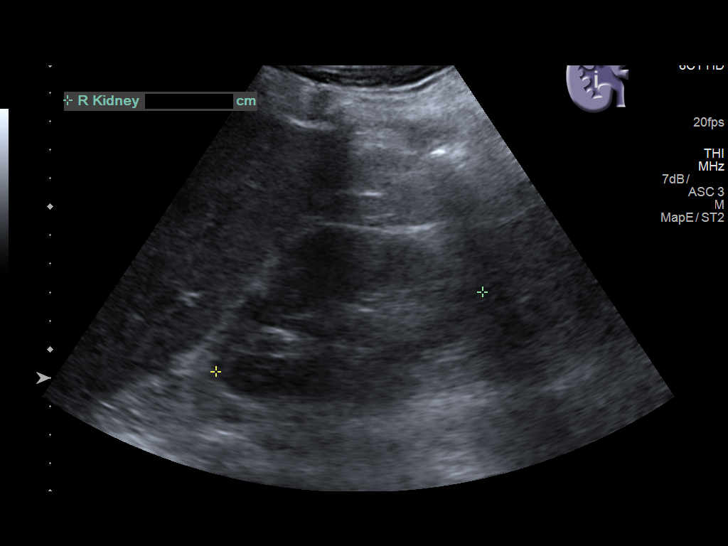
[im 11/42]
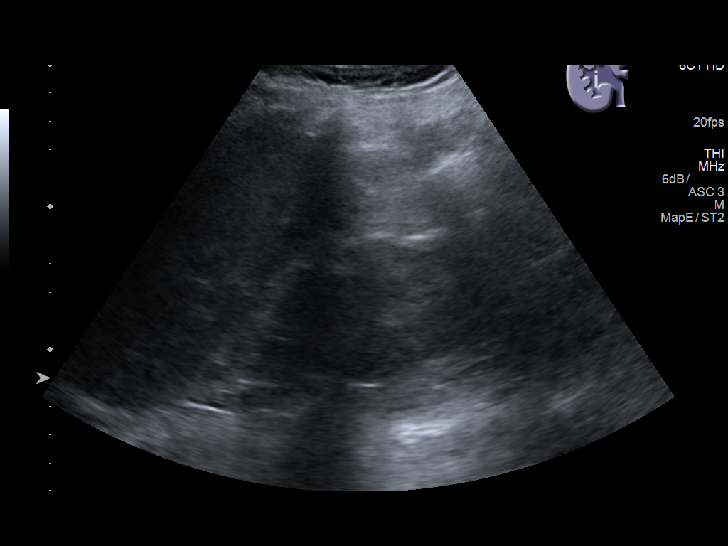
[im 14/42]
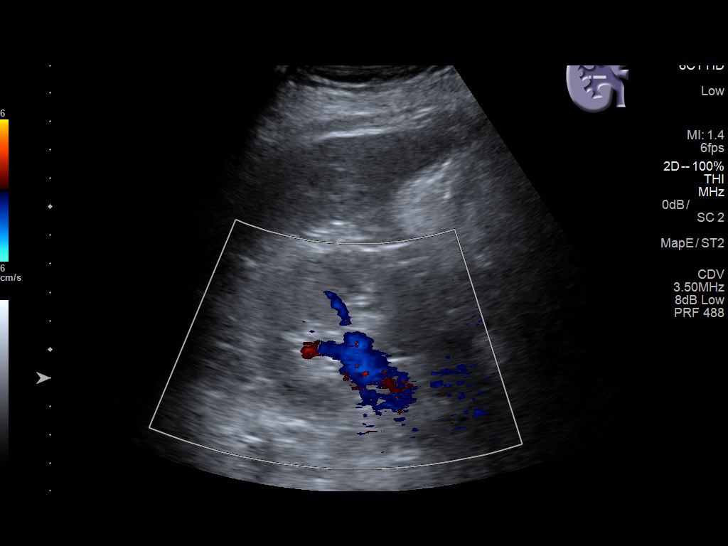
[im 16/42]
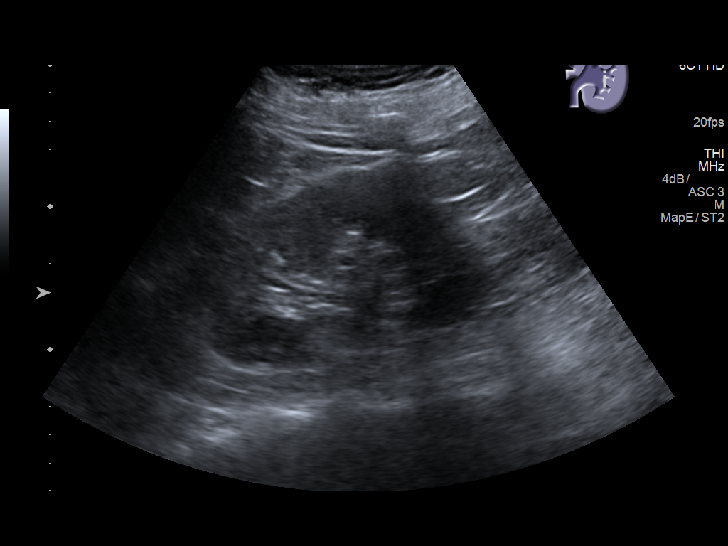
[im 19/42]
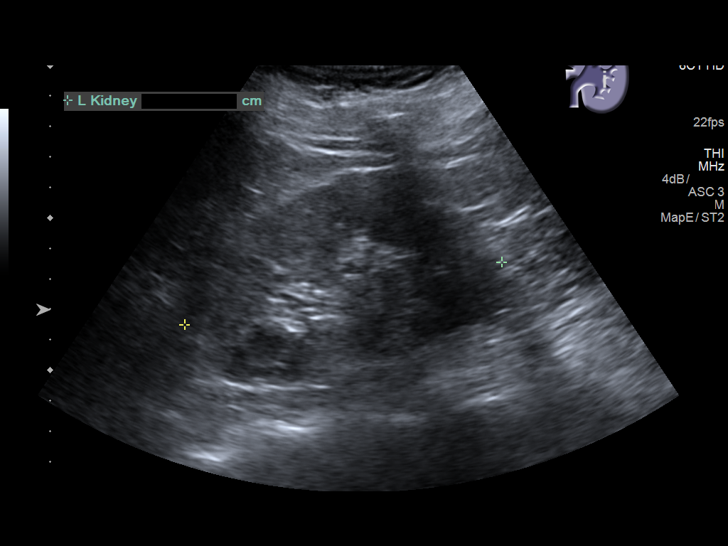
[im 23/42]
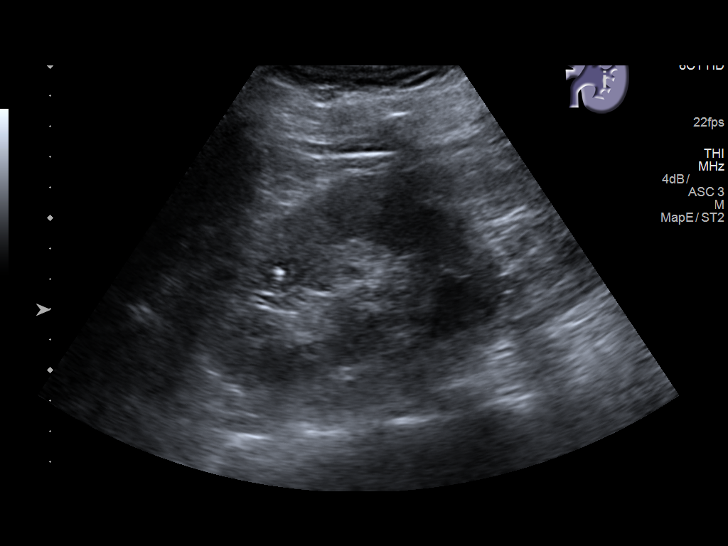
[im 26/42]
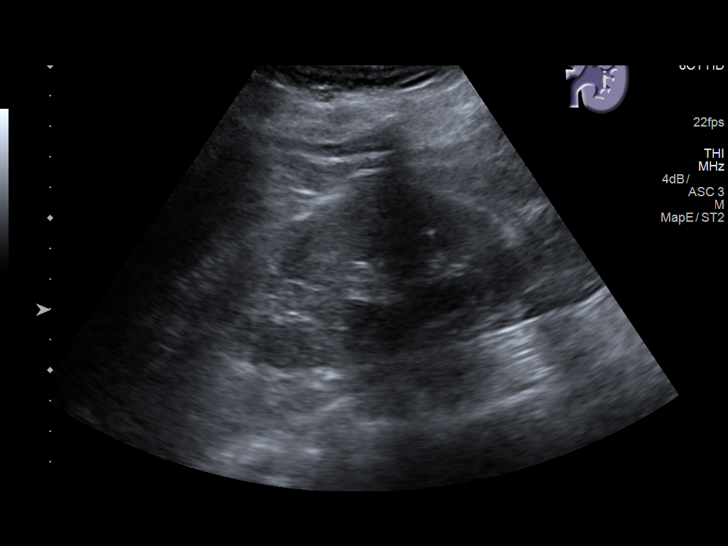
[im 28/42]
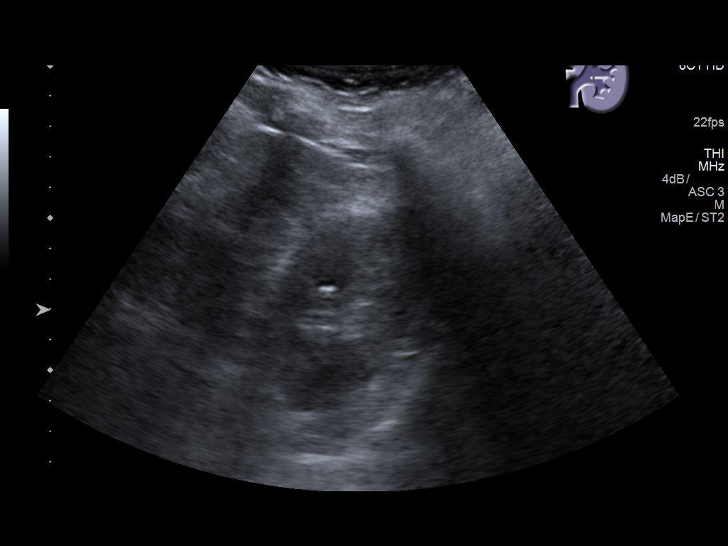
[im 31/42]
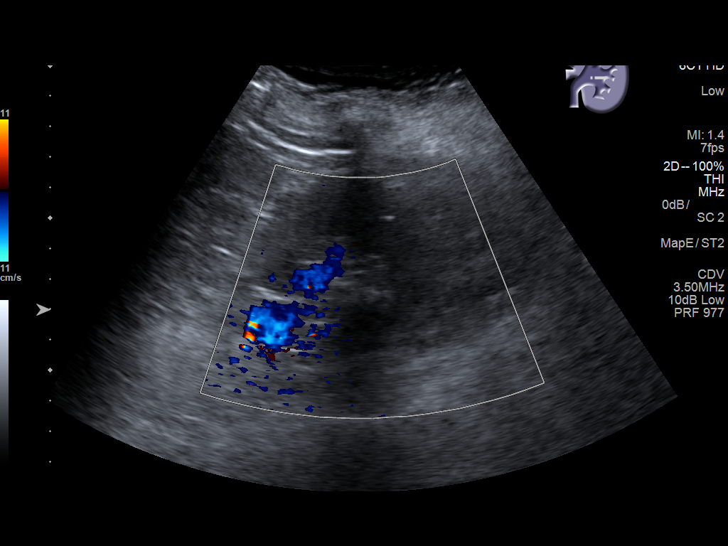
[im 35/42]
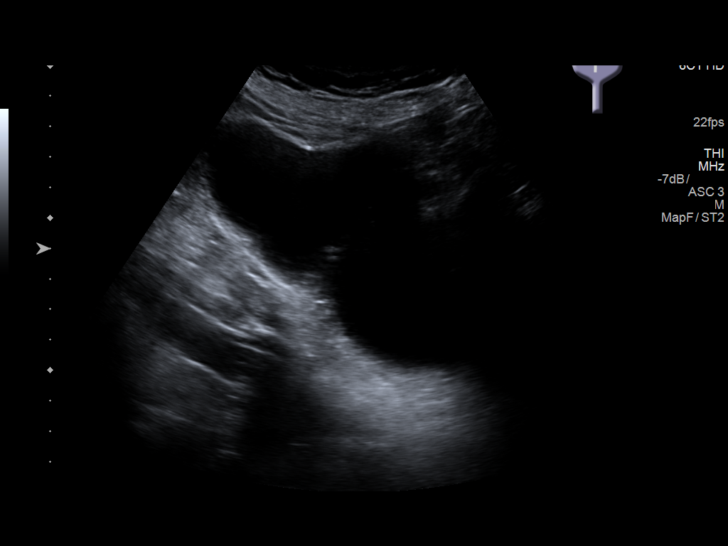
[im 38/42]
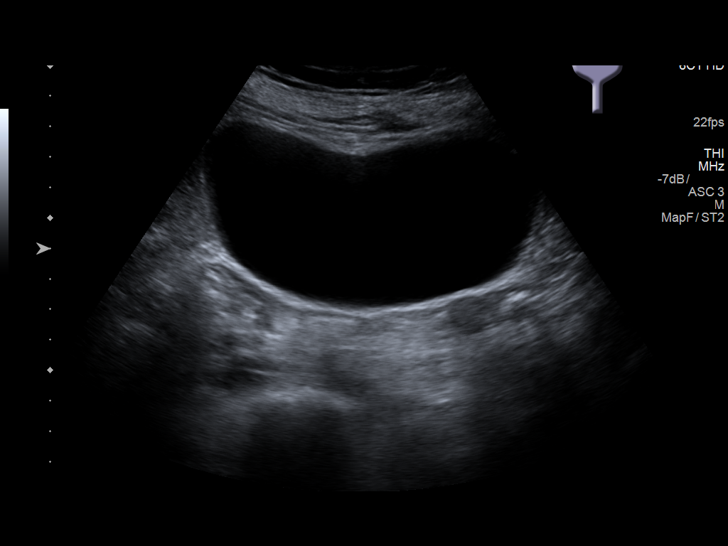
[im 42/42]
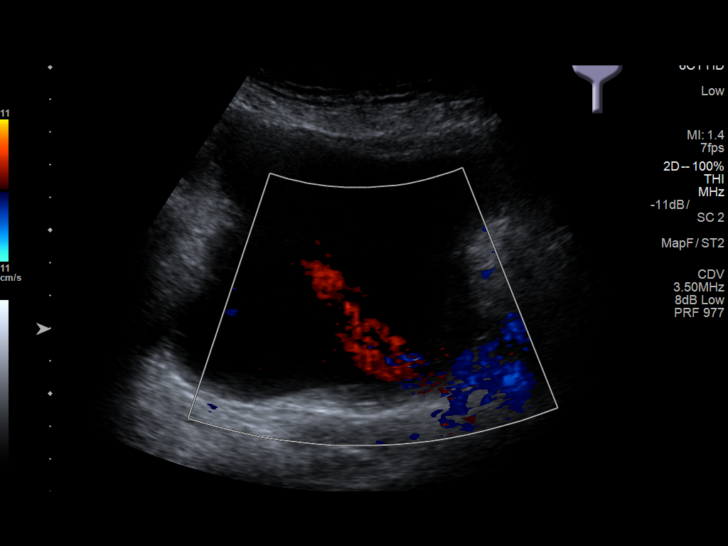

[14 of 25 positions shown; findings below may reference images not displayed]

FINDINGS: Right Kidney:

Length: 9 cm.  Echogenicity within normal limits.

Left Kidney:

Length: 10.6 cm. Echogenicity within normal limits. A 3 x 3 x 4 mm
nonobstructing upper pole renal calculus is noted. No mass or
hydronephrosis visualized. No mass or hydronephrosis visualized.

Bladder:

Appears normal for degree of bladder distention.
IMPRESSION: 1. No obstructive uropathy. Maintained cortical-medullary
distinction within both kidneys. No findings to explain the
patient's acute renal injury.
[DATE] x 3 x 4 mm nonobstructing left upper pole renal calculus is
identified sonographically.

## 2018-06-04 ENCOUNTER — Encounter: Payer: Self-pay | Admitting: Physician Assistant

## 2018-06-04 ENCOUNTER — Ambulatory Visit (INDEPENDENT_AMBULATORY_CARE_PROVIDER_SITE_OTHER): Payer: Medicare Other | Admitting: Physician Assistant

## 2018-06-04 VITALS — BP 102/60 | HR 69 | Ht 61.0 in | Wt 162.4 lb

## 2018-06-04 DIAGNOSIS — I10 Essential (primary) hypertension: Secondary | ICD-10-CM

## 2018-06-04 DIAGNOSIS — I25709 Atherosclerosis of coronary artery bypass graft(s), unspecified, with unspecified angina pectoris: Secondary | ICD-10-CM

## 2018-06-04 DIAGNOSIS — I5032 Chronic diastolic (congestive) heart failure: Secondary | ICD-10-CM | POA: Diagnosis not present

## 2018-06-04 DIAGNOSIS — I119 Hypertensive heart disease without heart failure: Secondary | ICD-10-CM | POA: Diagnosis not present

## 2018-06-04 DIAGNOSIS — R600 Localized edema: Secondary | ICD-10-CM

## 2018-06-04 MED ORDER — OLMESARTAN-AMLODIPINE-HCTZ 40-5-25 MG PO TABS: 1.0000 | ORAL_TABLET | Freq: Every day | ORAL | 6 refills | Status: DC

## 2018-06-04 NOTE — Addendum Note (Signed)
Addended by: Oleta Mouse on: 06/04/2018 12:23 PM   Modules accepted: Orders

## 2018-06-04 NOTE — Patient Instructions (Addendum)
Medication Instructions:   START TAKING OLMESARTAN-AMLODIPINE-HCTZ 40-5-25 MG ONCE A DAY   START TAKING ASPIRIN 81 MG ONCE A  DAY    If you need a refill on your cardiac medications before your next appointment, please call your pharmacy.  Labwork: NONE ORDERED  TODAY   Testing/Procedures: NONE ORDERED  TODAY    Follow-Up: AS SCHEDULED    Any Other Special Instructions Will Be Listed Below (If Applicable).

## 2018-06-04 NOTE — Progress Notes (Signed)
Cardiology Office Note    Date:  06/04/2018   ID:  Andrea [name], DOB 1946-05-30, MRN [id number]  PCP:  Renford Dills, MD  Cardiologist: Dr. Katrinka Blazing   Chief Complaint: SOB and ankle swelling   History of Present Illness:   Andrea [name] is a 72 y.o. female with hx of  coronary bypass grafting (LIMA to LAD, SVG to diagonal, SVG to OM) September 2018 after presenting with non-ST elevation myocardial infarction, diabetes mellitus type 2, hypertension, HLD and CVA presents for swelling and SOB.   Last echo 04/2017 showed LVEF of 60-65% with grade 1 DD; mild LVH; mild MR and TR.  She was doing well on cardiac stand point when seen by Dr. Katrinka Blazing 01/29/18.  Here today for LE edema and SOB intermittently. Recently dealing with vaginal prolapse. She denies any orthopnea, PND, dizziness, chest pain, palpitation or melena. Not taking ASA for long period of time. Eats low salt diet. Her ankle swelling is worse as days go by. She is intermittent very brief SOB with activity without chest pain/pressure. Weight stable.   Past Medical History:  Diagnosis Date  . Anemia 04/30/2017  . CHF (congestive heart failure) ([facility])   . Diabetes mellitus   . Hypertension   . Hypokalemia 04/2017  . Macular degeneration disease    loss of central vision on left and decreased peripheral vision.   . Stroke ([facility]) 12/2011   no residuals noted    Past Surgical History:  Procedure Laterality Date  . CHOLECYSTECTOMY    . CORONARY ARTERY BYPASS GRAFT N/A 04/18/2017   Procedure: CORONARY ARTERY BYPASS GRAFTING (CABG) times four using left internal mammary artery and right leg saphenous vein;  Surgeon: Kerin Perna, MD;  Location: [facility] OR;  Service: Open Heart Surgery;  Laterality: N/A;  . LEFT HEART CATH AND CORONARY ANGIOGRAPHY N/A 04/17/2017   Procedure: LEFT HEART CATH AND CORONARY ANGIOGRAPHY;  Surgeon: Marykay Lex, MD;  Location: [facility];  Service: Cardiovascular;  Laterality: N/A;  .  ROTATOR CUFF REPAIR     left  . TEE WITHOUT CARDIOVERSION N/A 04/18/2017   Procedure: TRANSESOPHAGEAL ECHOCARDIOGRAM (TEE);  Surgeon: Donata Clay, Theron Arista, MD;  Location: [facility] OR;  Service: Open Heart Surgery;  Laterality: N/A;    Current Medications: Prior to Admission medications   Medication Sig Start Date End Date Taking? Authorizing Provider  allopurinol (ZYLOPRIM) 100 MG tablet Take 100 mg by mouth daily. 09/10/17  Yes [provider]  aspirin EC 81 MG tablet Take 1 tablet (81 mg total) by mouth daily. 01/29/18  Yes Lyn Records, MD  carvedilol (COREG) 6.25 MG tablet Take 1 tablet (6.25 mg total) by mouth 2 (two) times daily with a meal. 01/30/18  Yes Lyn Records, MD  Cholecalciferol (VITAMIN D) 2000 UNITS tablet Take 2,000 Units by mouth daily.   Yes [provider]  HUMALOG KWIKPEN 100 UNIT/ML KiwkPen Inject 2-15 Units into the skin 3 (three) times daily before meals. (Per sliding scale) 06/15/17  Yes [provider]  Icosapent Ethyl (VASCEPA) 1 g CAPS Take 1 capsule (1 g total) by mouth 2 (two) times daily. 01/31/18  Yes Lyn Records, MD  insulin detemir (LEVEMIR) 100 UNIT/ML injection Inject 30 Units into the skin at bedtime. Sliding Scale   Yes [provider]  Olmesartan-amLODIPine-HCTZ 40-10-25 MG TABS Take 1 tablet by mouth daily. 08/25/17  Yes [provider]  ONETOUCH VERIO test strip USE TO TEST BLOOD SUGAR 4 TIMES  DAILY 05/10/17  Yes [provider]  tiZANidine (ZANAFLEX) 2 MG tablet TAKE 1 TABLET (2 MG TOTAL) BY MOUTH AT BEDTIME. 04/20/18  Yes Patel, Donika K, DO  VESICARE 10 MG tablet Take 10 mg by mouth daily. 12/23/15  Yes [provider]    Allergies:   Patient has no known allergies.   Social History   Socioeconomic History  . Marital status: Married    Spouse name: Not on file  . Number of children: 3  . Years of education: 35  . Highest education level: Not on file  Occupational History  . Occupation:  Professor    Associate Professor: [employer]  Social Needs  . Financial resource strain: Not on file  . Food insecurity:    Worry: Not on file    Inability: Not on file  . Transportation needs:    Medical: Not on file    Non-medical: Not on file  Tobacco Use  . Smoking status: Former Smoker    Packs/day: 0.50    Years: 20.00    Pack years: 10.00    Types: Cigarettes    Last attempt to quit: 01/02/1997    Years since quitting: 21.4  . Smokeless tobacco: Never Used  Substance and Sexual Activity  . Alcohol use: No  . Drug use: No  . Sexual activity: Not on file  Lifestyle  . Physical activity:    Days per week: Not on file    Minutes per session: Not on file  . Stress: Not on file  Relationships  . Social connections:    Talks on phone: Not on file    Gets together: Not on file    Attends religious service: Not on file    Active member of club or organization: Not on file    Attends meetings of clubs or organizations: Not on file    Relationship status: Not on file  Other Topics Concern  . Not on file  Social History Narrative   Lives with husband and 3 children in a 2 story home.  Retired professor at [employer].  Education: college.      Family History:  The patient's family history includes Healthy in her brother; Heart attack in her brother and father; Hypertension in her brother, father, and mother; Stroke in her mother.   ROS:   Please see the history of present illness.    ROS All other systems reviewed and are negative.   PHYSICAL EXAM:   VS:  BP 102/60   Pulse 69   Ht 5\' 1"  (1.549 m)   Wt 162 lb 6.4 oz (73.7 kg)   BMI 30.69 kg/m    GEN: Well nourished, well developed, in no acute distress  HEENT: normal  Neck: no JVD, carotid bruits, or masses Cardiac: RRR; no murmurs, rubs, or gallops, trace ankle edema  Respiratory:  clear to auscultation bilaterally, normal work of breathing GI: soft, nontender, nondistended, + BS MS: no deformity or atrophy  Skin: warm and  dry, no rash Neuro:  Alert and Oriented x 3, Strength and sensation are intact Psych: euthymic mood, full affect  Wt Readings from Last 3 Encounters:  06/04/18 162 lb 6.4 oz (73.7 kg)  01/29/18 158 lb 3.2 oz (71.8 kg)  10/09/17 158 lb 2 oz (71.7 kg)      Studies/Labs Reviewed:   EKG:  EKG is ordered today.  The ekg ordered today demonstrates SR at rate of 69 bpm  Recent Labs: No results found for  requested labs within last 8760 hours.   Lipid Panel    Component Value Date/Time   CHOL 142 01/29/2018 1237   TRIG 198 (H) 01/29/2018 1237   HDL 52 01/29/2018 1237   CHOLHDL 2.7 01/29/2018 1237   CHOLHDL 3.3 04/17/2017 0014   VLDL 41 (H) 04/17/2017 0014   LDLCALC 50 01/29/2018 1237    Additional studies/ records that were reviewed today include:   Echocardiogram: 04/2017 Study Conclusions  - Left ventricle: The cavity size was normal. Wall thickness was   increased in a pattern of mild LVH. Systolic function was normal.   The estimated ejection fraction was in the range of 60% to 65%.   There is hypokinesis of the basalinferolateral myocardium.   Doppler parameters are consistent with abnormal left ventricular   relaxation (grade 1 diastolic dysfunction). Doppler parameters   are consistent with high ventricular filling pressure. - Mitral valve: Calcified annulus. There was mild regurgitation.  Impressions:  - Hypokinesis of the basal inferolateral wall with overall normal   LV systolic function; mild diastolic dysfunction; mild LVH; mild   MR and TR.   ASSESSMENT & PLAN:    1. LE/ankle edema Mild diastolic dysfunction by echo 04/2017. Compliant with low sodium but does eats food high in salt. Advise to keep eye on food and edema correlation. Elevate legs. No orthopnea or PND. Her edema could be due to amlodipine. Will reduce to 5mg  qd.   2. CAD s/p CABG - No angina. No taking ASA for unknown period of time. Restart ASA 81mg  qd. Continue statin and BB.   3.  HTN - BP of 102/60, repeat check 104/60. Reduce amlodipine to 5mg  qd otherwise no change. Keep log of BP (goal of less than 130/80).   4. HLD - 01/29/2018: Cholesterol, Total 142; HDL 52; LDL Calculated 50; Triglycerides 198  - Continue statin   5. Brief DOE - no angina. Likely due to deconditioning. Advised to gradual build exercise regimen.   6. DM - Per PCP  Medication Adjustments/Labs and Tests Ordered: Current medicines are reviewed at length with the patient today.  Concerns regarding medicines are outlined above.  Medication changes, Labs and Tests ordered today are listed in the Patient Instructions below. Patient Instructions  Medication Instructions:   START TAKING OLMESARTAN-AMLODIPINE-HCTZ 40-5-25 MG ONCE A DAY   START TAKING ASPIRIN 81 MG ONCE A  DAY    If you need a refill on your cardiac medications before your next appointment, please call your pharmacy.  Labwork: NONE ORDERED  TODAY   Testing/Procedures: NONE ORDERED  TODAY    Follow-Up: AS SCHEDULED    Any Other Special Instructions Will Be Listed Below (If Applicable).  Lorelei Pont, [place]  06/04/2018 12:14 PM    [facility] [address] [place], [place], [place]  [address] Phone: [phone]; Fax: [phone]

## 2018-06-18 IMAGING — CR DG CHEST 2V
2 series · 2 of 2 positions shown · non-contrast
Comparison: 04/27/2017

CLINICAL DATA: Fever. No chest pain or SOB.

Recent CABG x [DATE].
EXAM:
CHEST  2 VIEW

[chest lat]
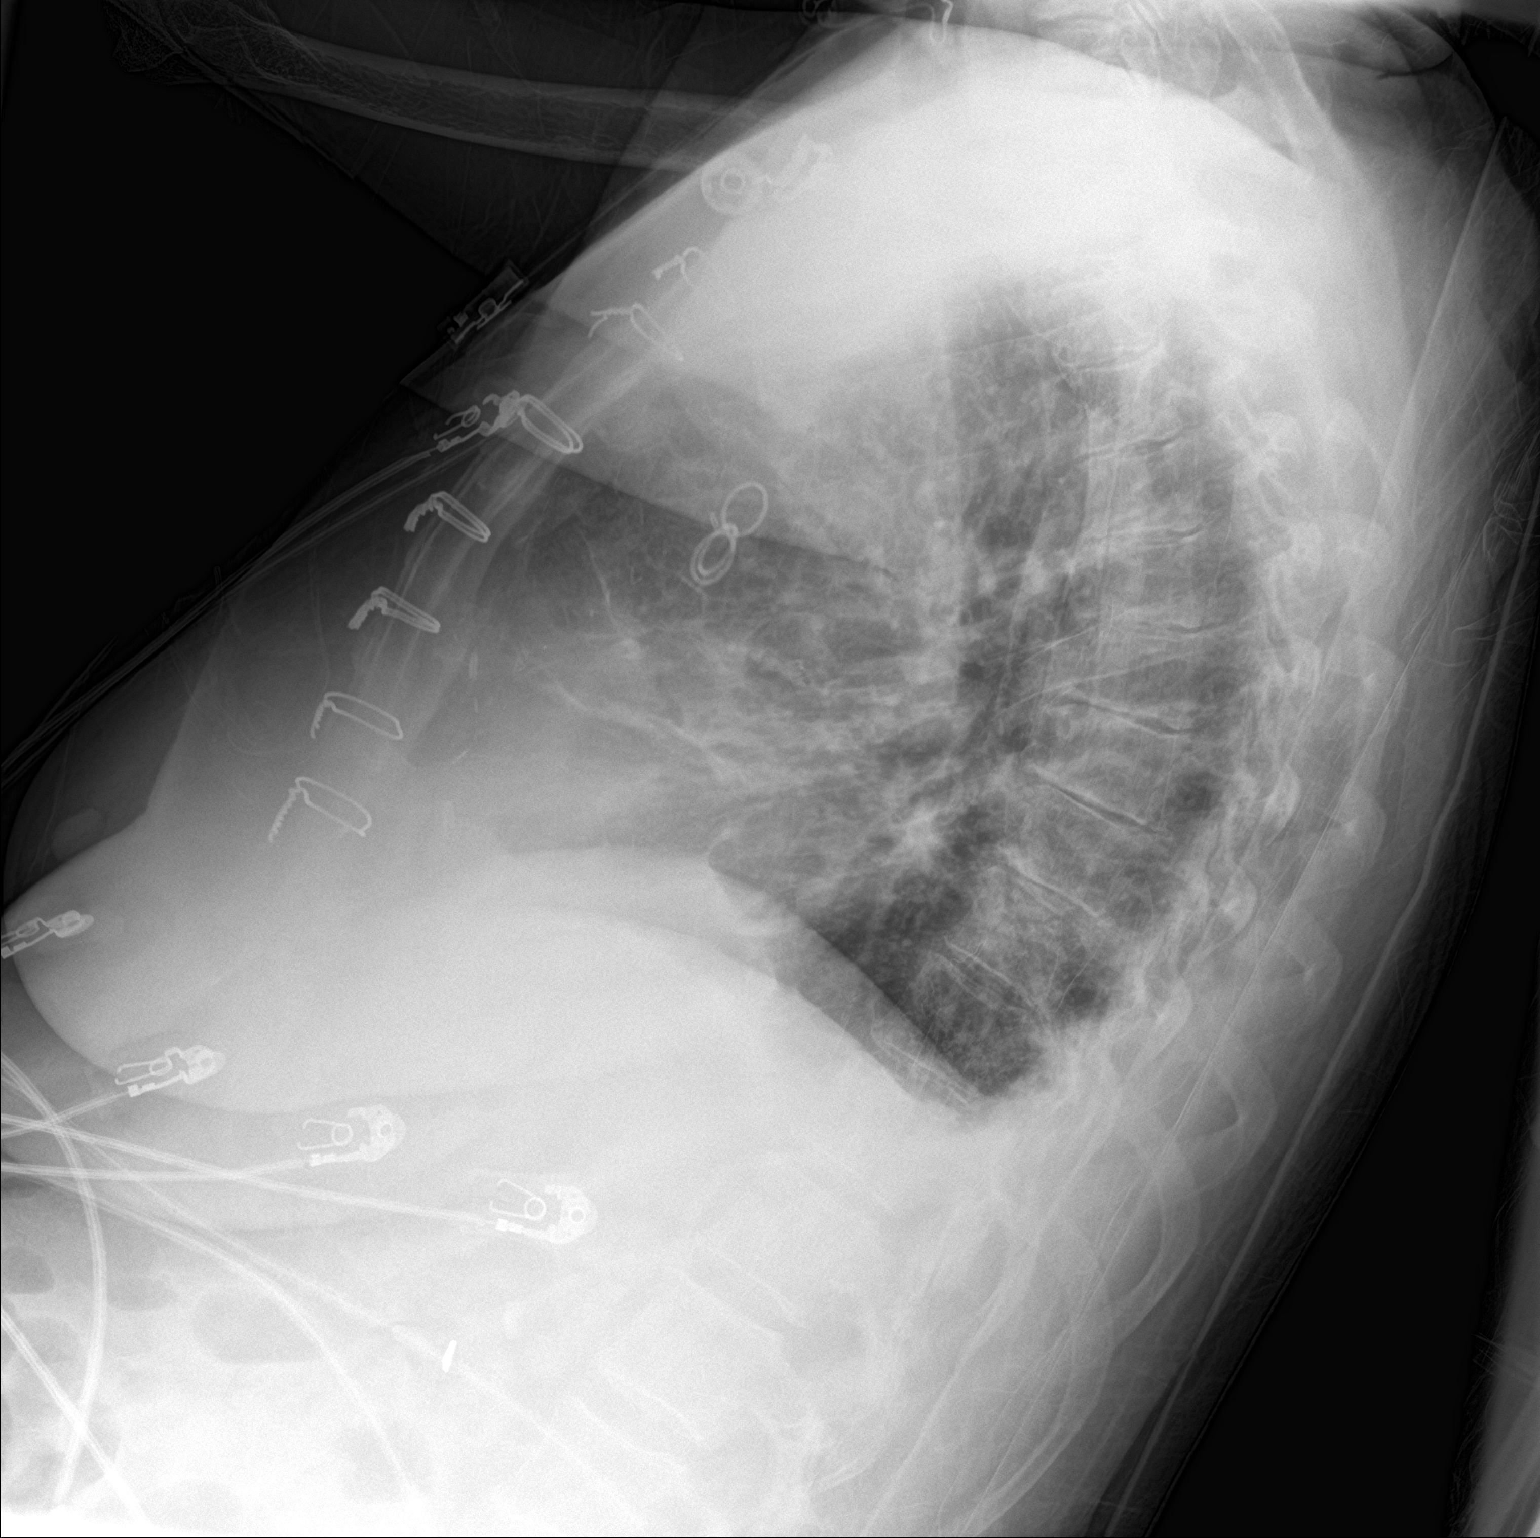

[chest ap]
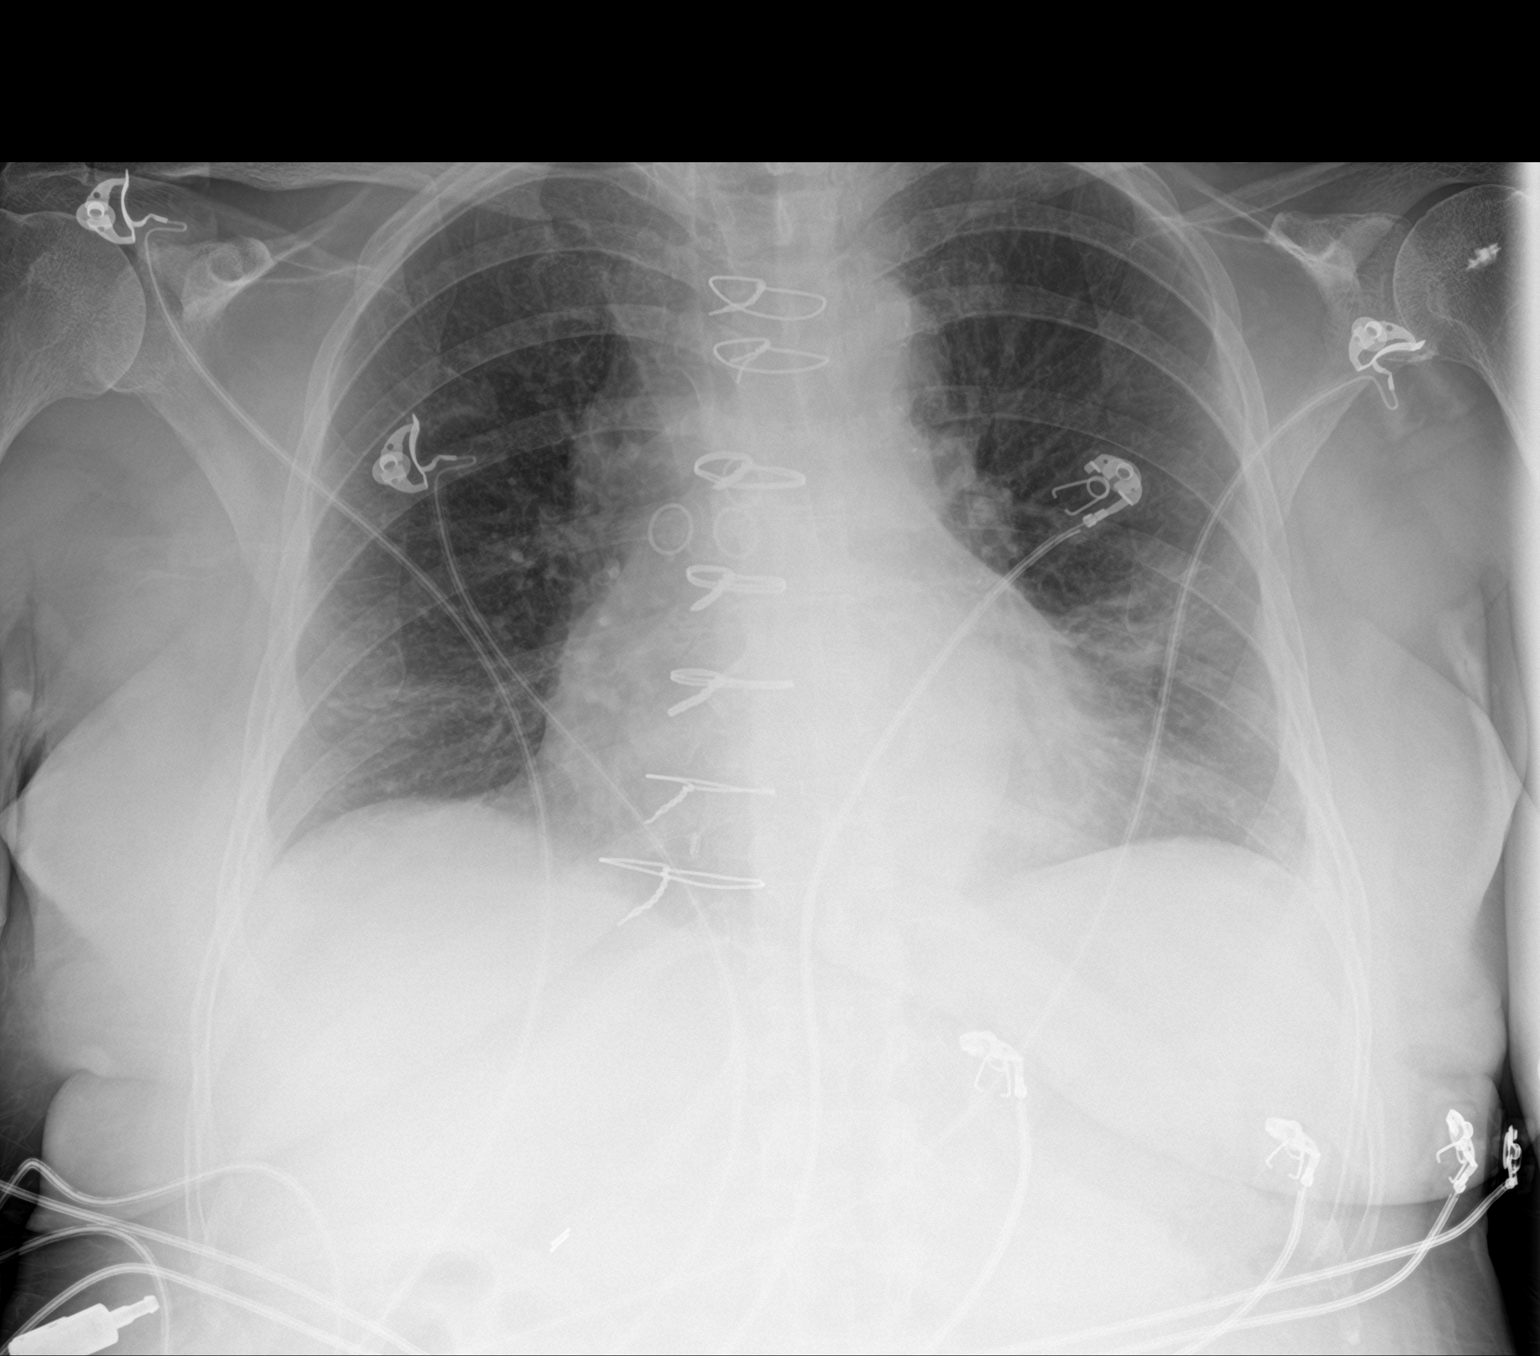

[2 of 2 positions shown; findings below may reference images not displayed]

FINDINGS: Lung base opacity present on the prior study has improved. There is
mild residual atelectasis at the left lung base. Lungs are otherwise
clear. Minimal pleural effusions have decreased from prior exam. No
pneumothorax.

Changes from the recent CABG surgery are noted. The cardiac
silhouette is mildly enlarged. No mediastinal or hilar masses or
evidence of adenopathy.

Skeletal structures are grossly intact.
IMPRESSION: No acute cardiopulmonary disease.

## 2018-07-05 IMAGING — CR DG CHEST 2V
2 series · 2 of 2 positions shown · non-contrast
Comparison: 04/30/2017 chest radiograph.

CLINICAL DATA: Recent CABG

EXAM:
CHEST  2 VIEW

[w chest pa]
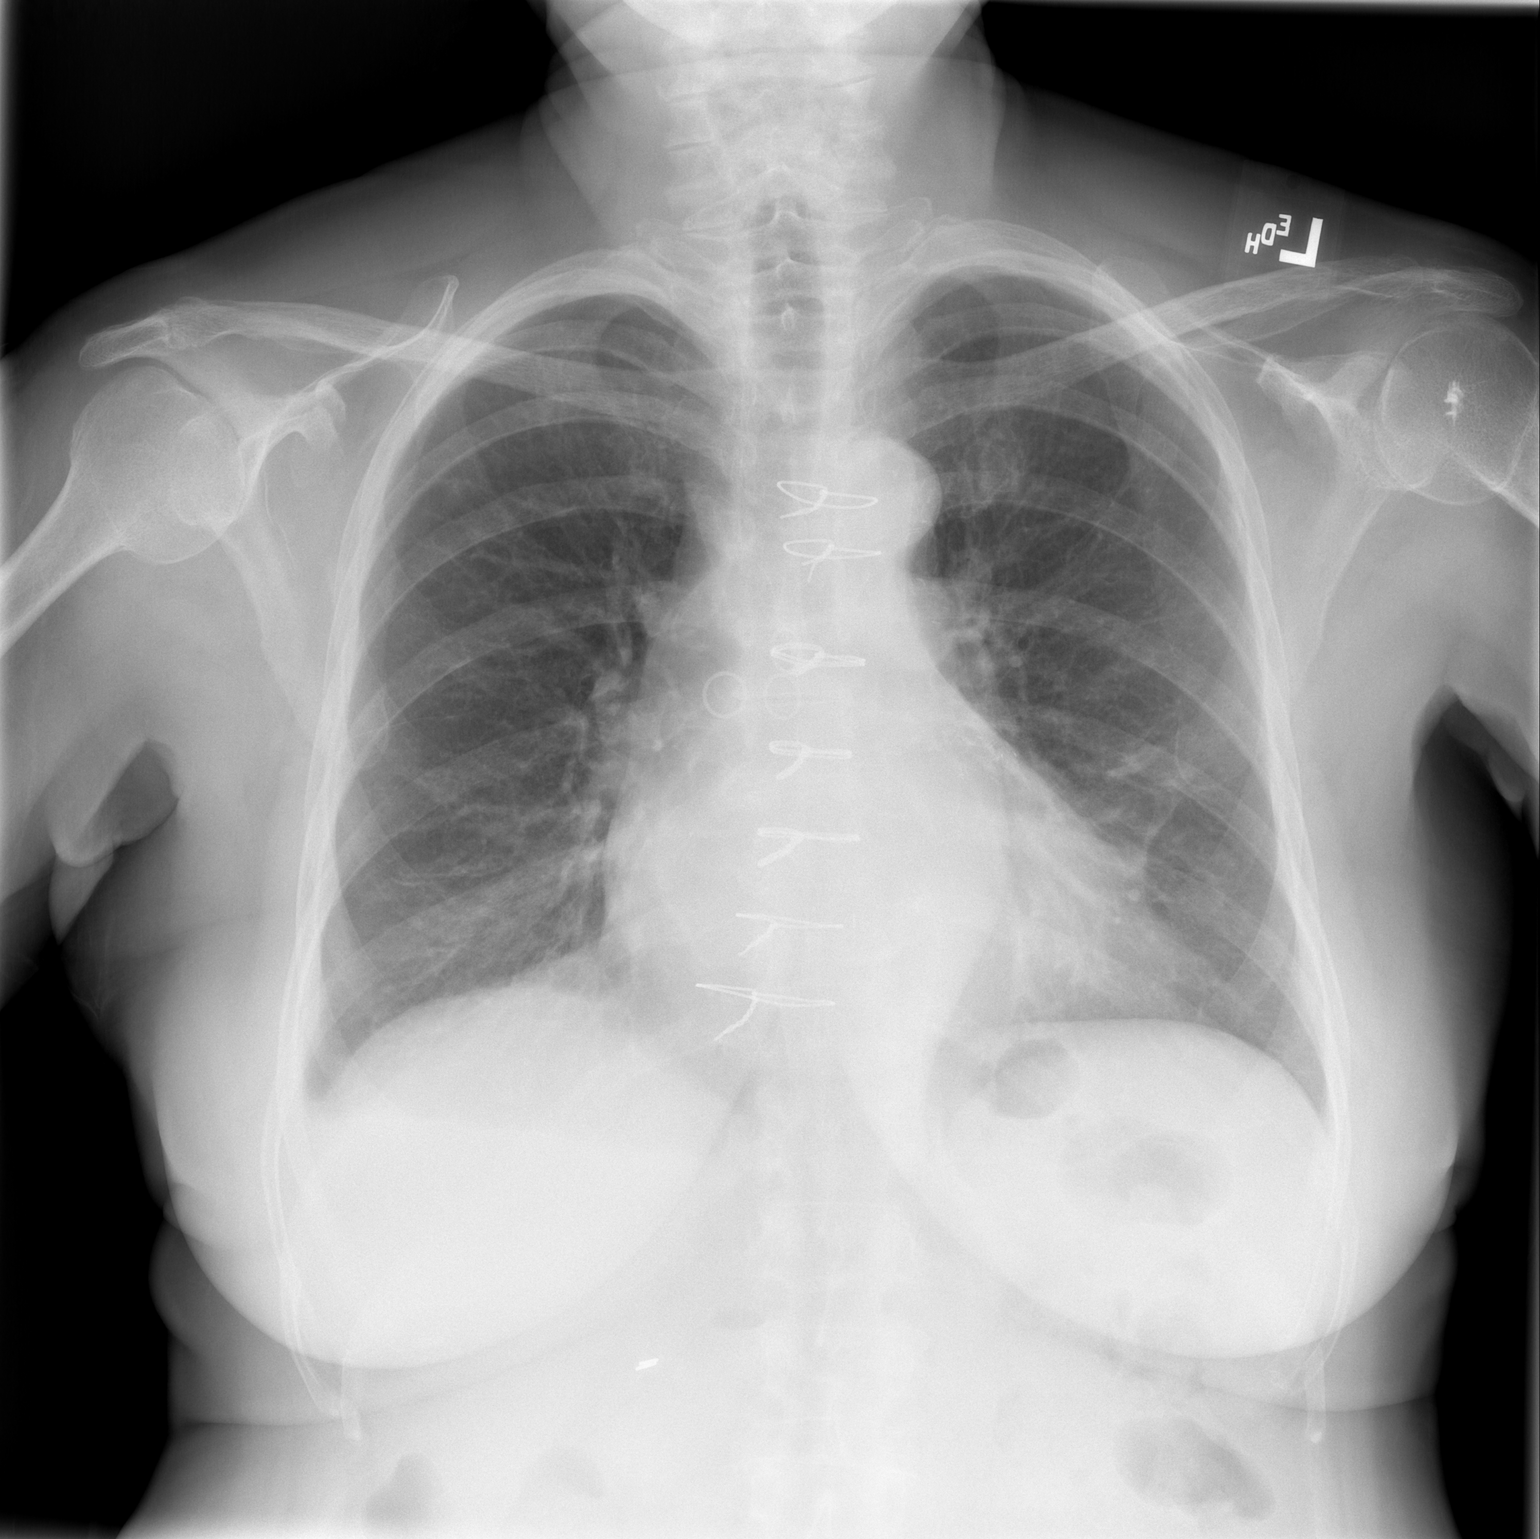

[w chest lat]
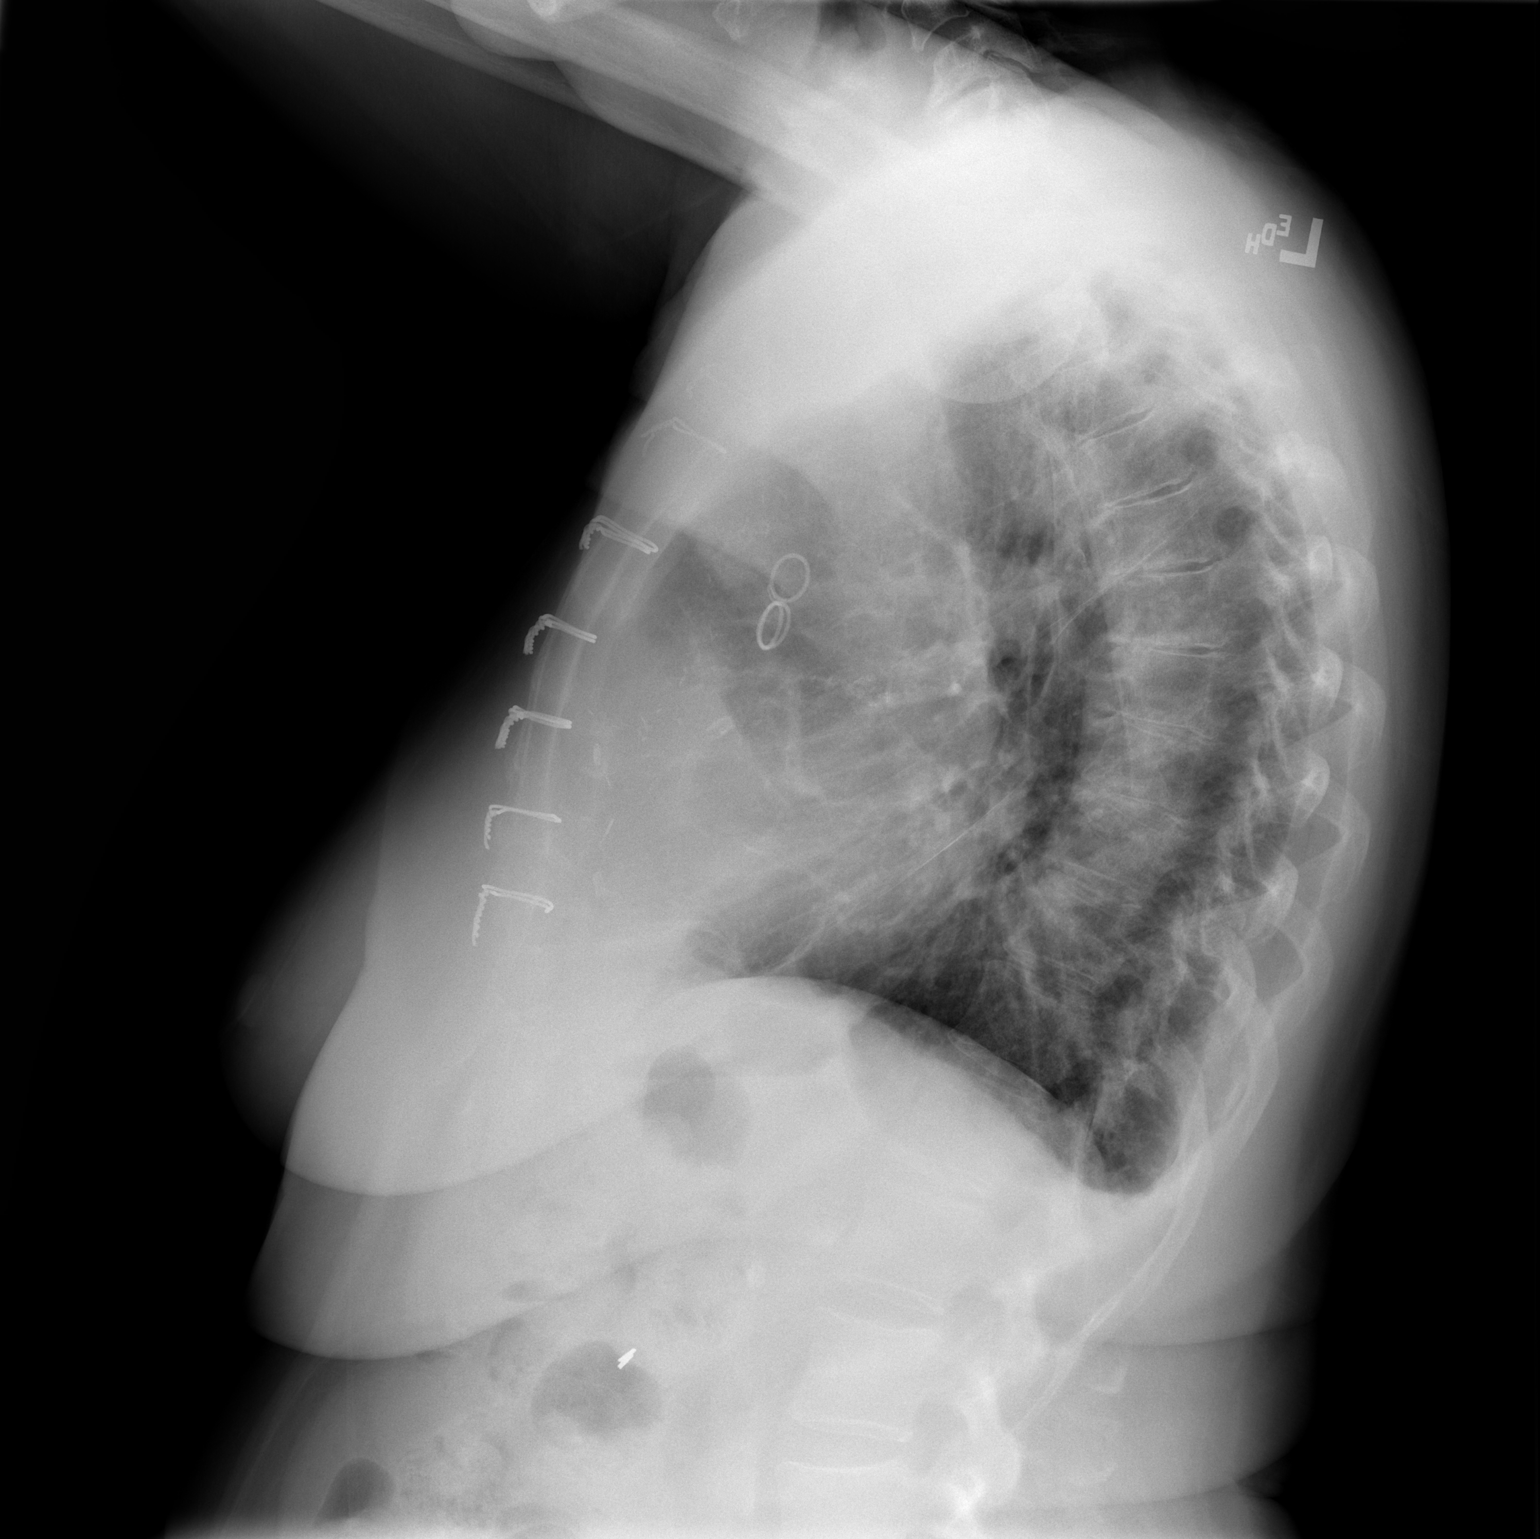

[2 of 2 positions shown; findings below may reference images not displayed]

FINDINGS: Intact sternotomy wires. Cholecystectomy clips are seen in the right
upper quadrant of the abdomen. Stable cardiomediastinal silhouette
with mild cardiomegaly. No pneumothorax. Trace right pleural
effusion, decreased. No left pleural effusion. No pulmonary edema.
No acute consolidative airspace disease.
IMPRESSION: 1. Stable mild cardiomegaly without pulmonary edema.
2. Trace right pleural effusion, decreased. No active pulmonary
disease.

## 2018-07-25 ENCOUNTER — Other Ambulatory Visit: Payer: Self-pay | Admitting: Neurology

## 2018-08-20 NOTE — Progress Notes (Signed)
Cardiology Office Note:    Date:  08/21/2018   ID:  Andrea Mandrilamela I Wolgamott, DOB Jan 14, 1946, MRN [id number]  PCP:  Renford DillsPolite, Ronald, MD  Cardiologist:  No primary care provider on file.   Referring MD: Renford DillsPolite, Ronald, MD   Chief Complaint  Patient presents with  . Coronary Artery Disease    History of Present Illness:    Andrea [name] is a 73 y.o. female with a hx of coronary bypass grafting (LIMA to LAD, SVG to diagonal, SVG to OM) September 2018 after presenting with non-ST elevation myocardial infarction, diabetes mellitus type 2, hypertension, and prior history of CVA. Acute on chronic right cerebral white matter infarct-with advanced small vessel disease. Left facial droop, 2013)neurogenic right arm discomfort post bypass surgery.  Right arm pain has dramatically improved.  Has limited range of motion of the shoulder.  Not having anginal quality chest discomfort.  Relatively sedentary due to urological issues including urinary incontinence.  This is been much worse since Vesicare was discontinued at the time of bypass surgery.  She has not had palpitations, syncope, edema, or other complaints related to her heart.   Past Medical History:  Diagnosis Date  . Anemia 04/30/2017  . CHF (congestive heart failure) (HCC)   . Diabetes mellitus   . Hypertension   . Hypokalemia 04/2017  . Macular degeneration disease    loss of central vision on left and decreased peripheral vision.   . Stroke (HCC) 12/2011   no residuals noted    Past Surgical History:  Procedure Laterality Date  . CHOLECYSTECTOMY    . CORONARY ARTERY BYPASS GRAFT N/A 04/18/2017   Procedure: CORONARY ARTERY BYPASS GRAFTING (CABG) times four using left internal mammary artery and right leg saphenous vein;  Surgeon: Kerin PernaVan Trigt, Peter, MD;  [facility];  Service: Open Heart Surgery;  Laterality: N/A;  . LEFT HEART CATH AND CORONARY ANGIOGRAPHY N/A 04/17/2017   Procedure: LEFT HEART CATH AND CORONARY ANGIOGRAPHY;  Surgeon:  Marykay LexHarding, David W, MD;  [facility];  Service: Cardiovascular;  Laterality: N/A;  . ROTATOR CUFF REPAIR     left  . TEE WITHOUT CARDIOVERSION N/A 04/18/2017   Procedure: TRANSESOPHAGEAL ECHOCARDIOGRAM (TEE);  Surgeon: Donata ClayVan Trigt, Theron AristaPeter, MD;  [facility];  Service: Open Heart Surgery;  Laterality: N/A;    Current Medications: Current Meds  Medication Sig  . aspirin EC 81 MG tablet Take 1 tablet (81 mg total) by mouth daily.  . carvedilol (COREG) 6.25 MG tablet Take 1 tablet (6.25 mg total) by mouth 2 (two) times daily with a meal.  . Cholecalciferol (VITAMIN D) 2000 UNITS tablet Take 2,000 Units by mouth daily.  Marland Kitchen. HUMALOG KWIKPEN 100 UNIT/ML KiwkPen Inject 2-15 Units into the skin 3 (three) times daily before meals. (Per sliding scale)  . Icosapent Ethyl (VASCEPA) 1 g CAPS Take 1 capsule (1 g total) by mouth 2 (two) times daily.  Marland Kitchen. JANUMET XR 50-500 MG TB24 Take 1 tablet by mouth 2 (two) times daily.  Marland Kitchen. NOVOFINE PLUS 32G X 4 MM MISC UP TO FOUR TIMES DAILY AS DIRECTED  . Olmesartan-amLODIPine-HCTZ 40-5-25 MG TABS Take 1 tablet by mouth daily.  Letta Pate. ONETOUCH VERIO test strip USE TO TEST BLOOD SUGAR 4 TIMES DAILY  . PREMARIN vaginal cream Place 1 application vaginally daily.  Evaristo Bury. TRESIBA FLEXTOUCH 100 UNIT/ML SOPN FlexTouch Pen Inject 24 Units into the skin daily.     Allergies:   Patient has no known allergies.   Social History  Socioeconomic History  . Marital status: Married    Spouse name: Not on file  . Number of children: 3  . Years of education: 8916  . Highest education level: Not on file  Occupational History  . Occupation: Professor    Associate Professormployer: [employer]  Social Needs  . Financial resource strain: Not on file  . Food insecurity:    Worry: Not on file    Inability: Not on file  . Transportation needs:    Medical: Not on file    Non-medical: Not on file  Tobacco Use  . Smoking status: Former Smoker    Packs/day: 0.50    Years: 20.00    Pack years:  10.00    Types: Cigarettes    Last attempt to quit: 01/02/1997    Years since quitting: 21.6  . Smokeless tobacco: Never Used  Substance and Sexual Activity  . Alcohol use: No  . Drug use: No  . Sexual activity: Not on file  Lifestyle  . Physical activity:    Days per week: Not on file    Minutes per session: Not on file  . Stress: Not on file  Relationships  . Social connections:    Talks on phone: Not on file    Gets together: Not on file    Attends religious service: Not on file    Active member of club or organization: Not on file    Attends meetings of clubs or organizations: Not on file    Relationship status: Not on file  Other Topics Concern  . Not on file  Social History Narrative   Lives with husband and 3 children in a 2 story home.  Retired professor at [employer].  Education: college.      Family History: The patient's family history includes Healthy in her brother; Heart attack in her brother and father; Hypertension in her brother, father, and mother; Stroke in her mother.  ROS:   Please see the history of present illness.    Stress because of her husband's decreasing cognitive function and immobility.  Limited quality of life due to urinary incontinence.  All other systems reviewed and are negative.  EKGs/Labs/Other Studies Reviewed:    The following studies were reviewed today: No new cardiac data or laboratory data.  No lab tests done since for Cipro was ordered.  EKG:  EKG is not performed today.  Recent Labs: No results found for requested labs within last 8760 hours.  Recent Lipid Panel    Component Value Date/Time   CHOL 142 01/29/2018 1237   TRIG 198 (H) 01/29/2018 1237   HDL 52 01/29/2018 1237   CHOLHDL 2.7 01/29/2018 1237   CHOLHDL 3.3 04/17/2017 0014   VLDL 41 (H) 04/17/2017 0014   LDLCALC 50 01/29/2018 1237    Physical Exam:    VS:  BP (!) 116/58   Pulse 64   Ht 5\' 1"  (1.549 m)   Wt 162 lb 6.4 oz (73.7 kg)   SpO2 99%   BMI 30.69 kg/m      Wt Readings from Last 3 Encounters:  08/21/18 162 lb 6.4 oz (73.7 kg)  06/04/18 162 lb 6.4 oz (73.7 kg)  01/29/18 158 lb 3.2 oz (71.8 kg)     GEN: Stable and appears younger than stated age.  Mildly overweight.. No acute distress HEENT: Normal NECK: No JVD. LYMPHATICS: No lymphadenopathy CARDIAC: RRR.  1/6 to 2/6 systolic murmur without gallop or edema.   VASCULAR: Pulses 2+ in the  radial and carotid bilateral., Bruits are absent in the neck. RESPIRATORY:  Clear to auscultation without rales, wheezing or rhonchi  ABDOMEN: Soft, non-tender, non-distended, No pulsatile mass, MUSCULOSKELETAL: No deformity  SKIN: Warm and dry NEUROLOGIC:  Alert and oriented x 3 PSYCHIATRIC:  Normal affect   ASSESSMENT:    1. Hypertension, unspecified type   2. Coronary artery disease involving coronary bypass graft of native heart with angina pectoris (HCC)   3. Hypertensive heart disease without heart failure   4. Diabetes mellitus, type II, insulin dependent (HCC)    PLAN:    In order of problems listed above:  1. Excellent blood pressure control.  Target 130/80 mmHg. 2. Improved without clinical angina currently.  Aggressive secondary risk prevention is discussed.  She is not very active and certainly needs to improve aiming for 150 minutes of moderate activity per week. 3. There is no volume overload or evidence of heart failure. 4. Hemoglobin A1c and diabetes management is being done by Dr. Onalee Hua.  She is having significant gastrointestinal complications/side effects related to metformin.  Consider SGLT2 therapy for better diabetes control and perhaps decreasing or eliminating metformin.  Overall education and awareness concerning primary/secondary risk prevention was discussed in detail: LDL less than 70, hemoglobin A1c less than 7, blood pressure target less than 130/80 mmHg, >150 minutes of moderate aerobic activity per week, avoidance of smoking, weight control (via diet and  exercise), and continued surveillance/management of/for obstructive sleep apnea.  8 to 18-month follow-up   Medication Adjustments/Labs and Tests Ordered: Current medicines are reviewed at length with the patient today.  Concerns regarding medicines are outlined above.  No orders of the defined types were placed in this encounter.  No orders of the defined types were placed in this encounter.   There are no Patient Instructions on file for this visit.   Signed, Lesleigh Noe, MD  08/21/2018 5:01 PM    [facility]

## 2018-08-21 ENCOUNTER — Encounter: Payer: Self-pay | Admitting: Interventional Cardiology

## 2018-08-21 ENCOUNTER — Ambulatory Visit: Payer: Medicare Other | Admitting: Interventional Cardiology

## 2018-08-21 VITALS — BP 116/58 | HR 64 | Ht 61.0 in | Wt 162.4 lb

## 2018-08-21 DIAGNOSIS — I25709 Atherosclerosis of coronary artery bypass graft(s), unspecified, with unspecified angina pectoris: Secondary | ICD-10-CM | POA: Diagnosis not present

## 2018-08-21 DIAGNOSIS — E119 Type 2 diabetes mellitus without complications: Secondary | ICD-10-CM | POA: Diagnosis not present

## 2018-08-21 DIAGNOSIS — I119 Hypertensive heart disease without heart failure: Secondary | ICD-10-CM

## 2018-08-21 DIAGNOSIS — I10 Essential (primary) hypertension: Secondary | ICD-10-CM | POA: Diagnosis not present

## 2018-08-21 DIAGNOSIS — Z794 Long term (current) use of insulin: Secondary | ICD-10-CM

## 2018-08-21 NOTE — Patient Instructions (Signed)
Medication Instructions:  Your physician recommends that you continue on your current medications as directed. Please refer to the Current Medication list given to you today.  If you need a refill on your cardiac medications before your next appointment, please call your pharmacy.   Lab work: Your physician recommends that you return for lab work when you are fasting (nothing to eat or drink after midnight except water and black coffee) Lipid, liver and BMET  If you have labs (blood work) drawn today and your tests are completely normal, you will receive your results only by: Marland Kitchen MyChart Message (if you have MyChart) OR . A paper copy in the mail If you have any lab test that is abnormal or we need to change your treatment, we will call you to review the results.  Testing/Procedures: None  Follow-Up: At [facility], you and your health needs are our priority.  As part of our continuing mission to provide you with exceptional heart care, we have created designated Provider Care Teams.  These Care Teams include your primary Cardiologist (physician) and Advanced Practice Providers (APPs -  Physician Assistants and Nurse Practitioners) who all work together to provide you with the care you need, when you need it. You will need a follow up appointment in 9-12 months.  Please call our office 2 months in advance to schedule this appointment.  You may see Dr. Katrinka Blazing or one of the following Advanced Practice Providers on your designated Care Team:   Norma Fredrickson, NP Nada Boozer, NP . Georgie Chard, NP  Any Other Special Instructions Will Be Listed Below (If Applicable).

## 2018-08-22 ENCOUNTER — Telehealth: Payer: Self-pay | Admitting: Interventional Cardiology

## 2018-08-22 NOTE — Telephone Encounter (Signed)
Will route to Dr. Michaelle Copas nurse and HIM.

## 2018-08-22 NOTE — Telephone Encounter (Signed)
New Message   Wanted to confirm the diagnosis of heart failure and EF results from most recent Echo  Also sending Fax

## 2018-08-24 ENCOUNTER — Other Ambulatory Visit: Payer: Medicare Other | Admitting: *Deleted

## 2018-08-24 DIAGNOSIS — I25709 Atherosclerosis of coronary artery bypass graft(s), unspecified, with unspecified angina pectoris: Secondary | ICD-10-CM

## 2018-08-24 LAB — BASIC METABOLIC PANEL
BUN/Creatinine Ratio: 16 (ref 12–28)
BUN: 19 mg/dL (ref 8–27)
CO2: 22 mmol/L (ref 20–29)
Calcium: 9.6 mg/dL (ref 8.7–10.3)
Chloride: 101 mmol/L (ref 96–106)
Creatinine, Ser: 1.16 mg/dL — ABNORMAL HIGH (ref 0.57–1.00)
GFR calc Af Amer: 54 mL/min/{1.73_m2} — ABNORMAL LOW (ref >59)
GFR calc non Af Amer: 47 mL/min/{1.73_m2} — ABNORMAL LOW (ref >59)
Glucose: 152 mg/dL — ABNORMAL HIGH (ref 65–99)
Potassium: 4 mmol/L (ref 3.5–5.2)
Sodium: 140 mmol/L (ref 134–144)

## 2018-08-24 LAB — LIPID PANEL
Chol/HDL Ratio: 2.5 ratio (ref 0.0–4.4)
Cholesterol, Total: 143 mg/dL (ref 100–199)
HDL: 58 mg/dL (ref >39)
LDL Calculated: 63 mg/dL (ref 0–99)
Triglycerides: 108 mg/dL (ref 0–149)
VLDL Cholesterol Cal: 22 mg/dL (ref 5–40)

## 2018-08-24 LAB — HEPATIC FUNCTION PANEL
ALT: 14 IU/L (ref 0–32)
AST: 12 IU/L (ref 0–40)
Albumin: 4 g/dL (ref 3.5–4.8)
Alkaline Phosphatase: 65 IU/L (ref 39–117)
Bilirubin Total: 1 mg/dL (ref 0.0–1.2)
Bilirubin, Direct: 0.25 mg/dL (ref 0.00–0.40)
Total Protein: 6.5 g/dL (ref 6.0–8.5)

## 2018-08-29 ENCOUNTER — Telehealth: Payer: Self-pay | Admitting: Interventional Cardiology

## 2018-08-29 NOTE — Telephone Encounter (Signed)
New Message    Nurse for [employer] to obtain patients most recent Echo and EF results done.

## 2018-10-30 ENCOUNTER — Other Ambulatory Visit: Payer: Self-pay | Admitting: Physician Assistant

## 2018-12-21 ENCOUNTER — Other Ambulatory Visit: Payer: Self-pay | Admitting: Interventional Cardiology

## 2019-02-21 ENCOUNTER — Other Ambulatory Visit: Payer: Self-pay | Admitting: Interventional Cardiology

## 2019-06-23 NOTE — Progress Notes (Signed)
Cardiology Office Note:    Date:  06/24/2019   ID:  Andrea [name], DOB [birth year], MRN [id number]  PCP:  Seward Carol, MD  Cardiologist:  Sinclair Grooms, MD    Referring MD: Seward Carol, MD   Chief Complaint  Patient presents with  . Coronary Artery Disease  . Congestive Heart Failure    History of Present Illness:    Andrea [name] is a 73 y.o. female with a hx of coronary bypass grafting 04/2017 (LIMA to LAD, SVG to diagonal, SVG to OM) September 2018 after presenting with non-ST elevation myocardial infarction, diabetes mellitus type 2, hypertension, and prior history of CVA. Acute on chronic right cerebral white matter infarct-with advanced small vessel disease. Left facial droop, 2013)neurogenic right arm discomfort post bypass surgery.  She is to undergo sacrocolpopexy for pelvic floor weakness.  This is planned for later this winter.  She has uterine and vaginal prolapse.  From cardiac standpoint she is doing well.  She has been relatively sedentary.  She has not had edema or orthopnea.  She has not had chest pain or nitroglycerin use.  She denies palpitations, syncope, and transient neurological symptoms.  She is well healed at this point from surgery on her heart in September 2018 when she had bypass.  She does have diastolic dysfunction but has no evidence or symptoms to suggest volume overload.  Overall she is quite pleased with how she is doing.  She has been relatively physically inactive due to the COVID-19 pandemic.  Past Medical History:  Diagnosis Date  . Anemia 04/30/2017  . CHF (congestive heart failure) ([place])   . Diabetes mellitus   . Hypertension   . Hypokalemia 04/2017  . Macular degeneration disease    loss of central vision on left and decreased peripheral vision.   . Stroke ([place]) 12/2011   no residuals noted    Past Surgical History:  Procedure Laterality Date  . CHOLECYSTECTOMY    . CORONARY ARTERY BYPASS GRAFT N/A 04/18/2017   Procedure:  CORONARY ARTERY BYPASS GRAFTING (CABG) times four using left internal mammary artery and right leg saphenous vein;  Surgeon: Ivin Poot, MD;  Location: [place];  Service: Open Heart Surgery;  Laterality: N/A;  . LEFT HEART CATH AND CORONARY ANGIOGRAPHY N/A 04/17/2017   Procedure: LEFT HEART CATH AND CORONARY ANGIOGRAPHY;  Surgeon: Leonie Man, MD;  Location: [place] CV LAB;  Service: Cardiovascular;  Laterality: N/A;  . ROTATOR CUFF REPAIR     left  . TEE WITHOUT CARDIOVERSION N/A 04/18/2017   Procedure: TRANSESOPHAGEAL ECHOCARDIOGRAM (TEE);  Surgeon: Prescott Gum, Collier Salina, MD;  Location: [place];  Service: Open Heart Surgery;  Laterality: N/A;    Current Medications: Current Meds  Medication Sig  . aspirin EC 81 MG tablet Take 1 tablet (81 mg total) by mouth daily.  . carvedilol (COREG) 6.25 MG tablet TAKE 1 TABLET (6.25 MG TOTAL) BY MOUTH 2 (TWO) TIMES DAILY WITH A MEAL.  Marland Kitchen Cholecalciferol (VITAMIN D) 2000 UNITS tablet Take 2,000 Units by mouth daily.  Marland Kitchen HUMALOG KWIKPEN 100 UNIT/ML KiwkPen Inject 2-15 Units into the skin 3 (three) times daily before meals. (Per sliding scale)  . JANUMET XR 50-500 MG TB24 Take 1 tablet by mouth 2 (two) times daily.  Marland Kitchen NOVOFINE PLUS 32G X 4 MM MISC UP TO FOUR TIMES DAILY AS DIRECTED  . Olmesartan-amLODIPine-HCTZ 40-5-25 MG TABS TAKE 1 TABLET BY MOUTH EVERY DAY  . ONETOUCH VERIO test strip USE TO TEST  BLOOD SUGAR 4 TIMES DAILY  . PREMARIN vaginal cream Place 1 application vaginally daily.  Andrea [name]. TRESIBA FLEXTOUCH 100 UNIT/ML SOPN FlexTouch Pen Inject 24 Units into the skin daily.  Marland Kitchen. VASCEPA 1 g CAPS TAKE 1 CAPSULE (1 G TOTAL) BY MOUTH 2 (TWO) TIMES DAILY.     Allergies:   Patient has no known allergies.   Social History   Socioeconomic History  . Marital status: Married    Spouse name: Not on file  . Number of children: 3  . Years of education: 1216  . Highest education level: Not on file  Occupational History  . Occupation: Professor    Associate Professormployer:  [employer]  Social Needs  . Financial resource strain: Not on file  . Food insecurity    Worry: Not on file    Inability: Not on file  . Transportation needs    Medical: Not on file    Non-medical: Not on file  Tobacco Use  . Smoking status: Former Smoker    Packs/day: 0.50    Years: 20.00    Pack years: 10.00    Types: Cigarettes    Quit date: 01/02/1997    Years since quitting: 22.4  . Smokeless tobacco: Never Used  Substance and Sexual Activity  . Alcohol use: No  . Drug use: No  . Sexual activity: Not on file  Lifestyle  . Physical activity    Days per week: Not on file    Minutes per session: Not on file  . Stress: Not on file  Relationships  . Social Musicianconnections    Talks on phone: Not on file    Gets together: Not on file    Attends religious service: Not on file    Active member of club or organization: Not on file    Attends meetings of clubs or organizations: Not on file    Relationship status: Not on file  Other Topics Concern  . Not on file  Social History Narrative   Lives with husband and 3 children in a 2 story home.  Retired professor at [employer].  Education: college.      Family History: The patient's family history includes Healthy in her brother; Heart attack in her brother and father; Hypertension in her brother, father, and mother; Stroke in her mother.  ROS:   Please see the history of present illness.    Has needed to use the pessary for the last 2 years.  She is somewhat perturbed that her GYN did not tell her that the prolapsed organ was her vagina.  All other systems reviewed and are negative.  EKGs/Labs/Other Studies Reviewed:    The following studies were reviewed today: No new cardiac imaging data.  EKG:  EKG normal sinus rhythm, nonspecific T wave flattening, poor R wave progression V1 through V4.  No change when compared to prior.  Recent Labs: 08/24/2018: ALT 14; BUN 19; Creatinine, Ser 1.16; Potassium 4.0; Sodium 140  Recent Lipid  Panel    Component Value Date/Time   CHOL 143 08/24/2018 1041   TRIG 108 08/24/2018 1041   HDL 58 08/24/2018 1041   CHOLHDL 2.5 08/24/2018 1041   CHOLHDL 3.3 04/17/2017 0014   VLDL 41 (H) 04/17/2017 0014   LDLCALC 63 08/24/2018 1041    Physical Exam:    VS:  BP 122/60   Pulse 72   Ht 5\' 1"  (1.549 m)   Wt 164 lb 12.8 oz (74.8 kg)   SpO2 97%   BMI  31.14 kg/m     Wt Readings from Last 3 Encounters:  06/24/19 164 lb 12.8 oz (74.8 kg)  08/21/18 162 lb 6.4 oz (73.7 kg)  06/04/18 162 lb 6.4 oz (73.7 kg)     GEN: Moderately overweight. No acute distress HEENT: Normal NECK: No JVD. LYMPHATICS: No lymphadenopathy CARDIAC:  RRR without murmur, gallop, or edema. VASCULAR:  Normal Pulses. No bruits. RESPIRATORY:  Clear to auscultation without rales, wheezing or rhonchi  ABDOMEN: Soft, non-tender, non-distended, No pulsatile mass, MUSCULOSKELETAL: No deformity  SKIN: Warm and dry NEUROLOGIC:  Alert and oriented x 3 PSYCHIATRIC:  Normal affect   ASSESSMENT:    1. Coronary artery disease involving coronary bypass graft of native heart with angina pectoris (HCC)   2. Preoperative cardiovascular examination   3. Chronic diastolic CHF (congestive heart failure) (HCC)   4. Diabetes mellitus, type II, insulin dependent (HCC)   5. Essential hypertension   6. Familial hypercholesterolemia   7. Cerebrovascular accident (CVA) due to other mechanism (HCC)   8. Educated about COVID-19 virus infection    PLAN:    In order of problems listed above:  1. Secondary prevention is discussed.  She needs to increase physical activity. 2. She is cleared for upcoming robotic gynecologic/urologic procedure.  From cardiac standpoint, the risk will be low. 3. No evidence of volume overload. 4. A1c target is less than 7.  Most recently 7.4 in May 2020. 5. Target blood pressure 130/80 mmHg. 6. Most recent LDL 58 July 2020 7. No new neurological complaints.  Secondary prevention as per coronary  disease prevention. 8. 3W's is endorsed and practiced.  She is mostly shut in her house.  Overall education and awareness concerning primary/secondary risk prevention was discussed in detail: LDL less than 70, hemoglobin A1c less than 7, blood pressure target less than 130/80 mmHg, >150 minutes of moderate aerobic activity per week, avoidance of smoking, weight control (via diet and exercise), and continued surveillance/management of/for obstructive sleep apnea.    Medication Adjustments/Labs and Tests Ordered: Current medicines are reviewed at length with the patient today.  Concerns regarding medicines are outlined above.  No orders of the defined types were placed in this encounter.  No orders of the defined types were placed in this encounter.   There are no Patient Instructions on file for this visit.   Signed, Lesleigh Noe, MD  06/24/2019 2:48 PM    [facility]

## 2019-06-24 ENCOUNTER — Other Ambulatory Visit: Payer: Self-pay | Admitting: Urology

## 2019-06-24 ENCOUNTER — Other Ambulatory Visit: Payer: Self-pay

## 2019-06-24 ENCOUNTER — Ambulatory Visit (INDEPENDENT_AMBULATORY_CARE_PROVIDER_SITE_OTHER): Payer: Medicare Other | Admitting: Interventional Cardiology

## 2019-06-24 ENCOUNTER — Telehealth: Payer: Self-pay | Admitting: Interventional Cardiology

## 2019-06-24 ENCOUNTER — Encounter: Payer: Self-pay | Admitting: Interventional Cardiology

## 2019-06-24 VITALS — BP 122/60 | HR 72 | Ht 61.0 in | Wt 164.8 lb

## 2019-06-24 DIAGNOSIS — Z01810 Encounter for preprocedural cardiovascular examination: Secondary | ICD-10-CM

## 2019-06-24 DIAGNOSIS — E119 Type 2 diabetes mellitus without complications: Secondary | ICD-10-CM

## 2019-06-24 DIAGNOSIS — I6389 Other cerebral infarction: Secondary | ICD-10-CM

## 2019-06-24 DIAGNOSIS — I10 Essential (primary) hypertension: Secondary | ICD-10-CM

## 2019-06-24 DIAGNOSIS — I5032 Chronic diastolic (congestive) heart failure: Secondary | ICD-10-CM

## 2019-06-24 DIAGNOSIS — Z7189 Other specified counseling: Secondary | ICD-10-CM

## 2019-06-24 DIAGNOSIS — I25709 Atherosclerosis of coronary artery bypass graft(s), unspecified, with unspecified angina pectoris: Secondary | ICD-10-CM | POA: Diagnosis not present

## 2019-06-24 DIAGNOSIS — Z794 Long term (current) use of insulin: Secondary | ICD-10-CM

## 2019-06-24 DIAGNOSIS — E7801 Familial hypercholesterolemia: Secondary | ICD-10-CM

## 2019-06-24 DIAGNOSIS — I110 Hypertensive heart disease with heart failure: Secondary | ICD-10-CM

## 2019-06-24 NOTE — Telephone Encounter (Signed)
Patient is scheduled to see Dr. Tamala Julian later this afternoon at 2:00pm. I sent message to Dr. Tamala Julian so he is aware and can address at that time.

## 2019-06-24 NOTE — Patient Instructions (Signed)
Medication Instructions:  Your physician recommends that you continue on your current medications as directed. Please refer to the Current Medication list given to you today.  *If you need a refill on your cardiac medications before your next appointment, please call your pharmacy*  Lab Work: None If you have labs (blood work) drawn today and your tests are completely normal, you will receive your results only by: . MyChart Message (if you have MyChart) OR . A paper copy in the mail If you have any lab test that is abnormal or we need to change your treatment, we will call you to review the results.  Testing/Procedures: None  Follow-Up: At [facility], you and your health needs are our priority.  As part of our continuing mission to provide you with exceptional heart care, we have created designated Provider Care Teams.  These Care Teams include your primary Cardiologist (physician) and Advanced Practice Providers (APPs -  Physician Assistants and Nurse Practitioners) who all work together to provide you with the care you need, when you need it.  Your next appointment:   9 month(s)  The format for your next appointment:   In Person  Provider:   You may see Henry W Smith III, MD or one of the following Advanced Practice Providers on your designated Care Team:    Lori Gerhardt, NP  Laura Ingold, NP  Jill McDaniel, NP   Other Instructions   

## 2019-06-24 NOTE — Telephone Encounter (Signed)
° °  [facility] Pre-operative Risk Assessment    Request for surgical clearance:  1. What type of surgery is being performed? Robotic Sacrocolpopexy  2. When is this surgery scheduled? 07/31/19  3. What type of clearance is required (medical clearance vs. Pharmacy clearance to hold med vs. Both)? medical  4. Are there any medications that need to be held prior to surgery and how long? Aspirin 5 days before  5. Practice name and name of physician performing surgery? [facility] Dr. Louis Meckel   6. What is your office phone number [phone] ext [phone]   7.   What is your office fax number [phone]  8.   Anesthesia type (None, local, MAC, general) ? General   Leah Newnam 06/24/2019, 9:38 AM  _________________________________________________________________   (provider comments below)

## 2019-07-21 ENCOUNTER — Other Ambulatory Visit: Payer: Self-pay | Admitting: Physician Assistant

## 2019-07-25 NOTE — Patient Instructions (Addendum)
DUE TO COVID-19 ONLY ONE VISITOR IS ALLOWED TO COME WITH YOU AND STAY IN THE WAITING ROOM ONLY DURING PRE OP AND PROCEDURE DAY OF SURGERY. THE 1 VISITOR MAY VISIT WITH YOU AFTER SURGERY IN YOUR PRIVATE ROOM DURING VISITING HOURS ONLY!  YOU NEED TO HAVE A COVID 19 TEST ON: 07/27/2019 @ 11:30 PM THIS TEST MUST BE DONE BEFORE SURGERY, COME  [address], [place] , [phone].  [facility]) ONCE YOUR COVID TEST IS COMPLETED, PLEASE BEGIN THE QUARANTINE INSTRUCTIONS AS OUTLINED IN YOUR HANDOUT.                Andrea MandrilPamela I [name]    Your procedure is scheduled on: 07/31/2019   [facility]    Report to: Short stay  At: 5:30 AM     Call this number if you have problems the morning of surgery [phone]    Remember: Do not eat food or drink liquids :After Midnight. BRUSH YOUR TEETH MORNING OF SURGERY AND RINSE YOUR MOUTH OUT, NO CHEWING GUM CANDY OR MINTS.     Take these medicines the morning of surgery with A SIP OF WATER: carvedilol (Coreg).    How to Manage Your Diabetes Before and After Surgery  Why is it important to control my blood sugar before and after surgery? . Improving blood sugar levels before and after surgery helps healing and can limit problems. . A way of improving blood sugar control is eating a healthy diet by: o  Eating less sugar and carbohydrates o  Increasing activity/exercise o  Talking with your doctor about reaching your blood sugar goals . High blood sugars (greater than 180 mg/dL) can raise your risk of infections and slow your recovery, so you will need to focus on controlling your diabetes during the weeks before surgery. . Make sure that the doctor who takes care of your diabetes knows about your planned surgery including the date and location.  How do I manage my blood sugar before surgery? . Check your blood sugar at least 4 times a day, starting 2 days before surgery, to make sure that the level is not  too high or low. o Check your blood sugar the morning of your surgery when you wake up and every 2 hours until you get to the Short Stay unit. . If your blood sugar is less than 70 mg/dL, you will need to treat for low blood sugar: o Do not take insulin. o Treat a low blood sugar (less than 70 mg/dL) with  cup of clear juice (cranberry or apple), 4 glucose tablets, OR glucose gel. o Recheck blood sugar in 15 minutes after treatment (to make sure it is greater than 70 mg/dL). If your blood sugar is not greater than 70 mg/dL on recheck, call [phone] for further instructions. . Report your blood sugar to the short stay nurse when you get to Short Stay.  . If you are admitted to the hospital after surgery: o Your blood sugar will be checked by the staff and you will probably be given insulin after surgery (instead of oral diabetes medicines) to make sure you have good blood sugar levels. o The goal for blood sugar control after surgery is 80-180 mg/dL.   WHAT DO I DO ABOUT MY DIABETES MEDICATION?        - The day before surgery: 07/30/19   -Do not take Janumet .   -take your morning and afternoon dose of  Humalog as usual, ,Do not take night dose of Humalog   -Do not take [place]        . The day of surgery 07/31/19    -If your blood sugar is greater than 220 mg/dL, you may take  of your Humalog insulin  -Do not take any oral diabetic medications      Patient Signature:  Date:   Nurse Signature:  Date:   Reviewed and Endorsed by [facility] Patient Education Committee, August 2015                                You may not have any metal on your body including hair pins and              piercings  Do not wear jewelry, make-up, lotions, powders or perfumes, deodorant             Do not wear nail polish on your fingernails.  Do not shave  48 hours prior to surgery.                  Do not bring valuables to the hospital. [facility] IS NOT             RESPONSIBLE   FOR  VALUABLES.  Contacts, dentures or bridgework may not be worn into surgery.  Leave suitcase in the car. After surgery it may be brought to your room.                [facility] - Preparing for Surgery Before surgery, you can play an important role.  Because skin is not sterile, your skin needs to be as free of germs as possible.  You can reduce the number of germs on your skin by washing with CHG (chlorahexidine gluconate) soap before surgery.  CHG is an antiseptic cleaner which kills germs and bonds with the skin to continue killing germs even after washing. Please DO NOT use if you have an allergy to CHG or antibacterial soaps.  If your skin becomes reddened/irritated stop using the CHG and inform your nurse when you arrive at Short Stay. Do not shave (including legs and underarms) for at least 48 hours prior to the first CHG shower.  You may shave your face/neck. Please follow these instructions carefully:  1.  Shower with CHG Soap the night before surgery and the  morning of Surgery.  2.  If you choose to wash your hair, wash your hair first as usual with your  normal  shampoo.  3.  After you shampoo, rinse your hair and body thoroughly to remove the  shampoo.                           4.  Use CHG as you would any other liquid soap.  You can apply chg directly  to the skin and wash                       Gently with a scrungie or clean washcloth.  5.  Apply the CHG Soap to your body ONLY FROM THE NECK DOWN.   Do not use on face/ open                           Wound or open sores. Avoid contact with eyes, ears mouth and  genitals (private parts).                       Wash face,  Genitals (private parts) with your normal soap.             6.  Wash thoroughly, paying special attention to the area where your surgery  will be performed.  7.  Thoroughly rinse your body with warm water from the neck down.  8.  DO NOT shower/wash with your normal soap after using and rinsing off  the CHG Soap.                 9.  Pat yourself dry with a clean towel.            10.  Wear clean pajamas.            11.  Place clean sheets on your bed the night of your first shower and do not  sleep with pets. Day of Surgery : Do not apply any lotions/deodorants the morning of surgery.  Please wear clean clothes to the hospital/surgery center.  FAILURE TO FOLLOW THESE INSTRUCTIONS MAY RESULT IN THE CANCELLATION OF YOUR SURGERY PATIENT SIGNATURE_________________________________  NURSE SIGNATURE__________________________________  ________________________________________________________________________

## 2019-07-26 ENCOUNTER — Encounter (HOSPITAL_COMMUNITY): Payer: Self-pay

## 2019-07-26 ENCOUNTER — Other Ambulatory Visit: Payer: Self-pay

## 2019-07-26 ENCOUNTER — Encounter (HOSPITAL_COMMUNITY)
Admission: RE | Admit: 2019-07-26 | Discharge: 2019-07-26 | Disposition: A | Discharge status: Home / Self Care | Payer: Medicare Other | Source: Ambulatory Visit | Attending: Urology | Admitting: Urology

## 2019-07-26 DIAGNOSIS — Z01812 Encounter for preprocedural laboratory examination: Secondary | ICD-10-CM | POA: Insufficient documentation

## 2019-07-26 DIAGNOSIS — I10 Essential (primary) hypertension: Secondary | ICD-10-CM | POA: Insufficient documentation

## 2019-07-26 DIAGNOSIS — Z87891 Personal history of nicotine dependence: Secondary | ICD-10-CM | POA: Insufficient documentation

## 2019-07-26 DIAGNOSIS — N8189 Other female genital prolapse: Secondary | ICD-10-CM | POA: Diagnosis not present

## 2019-07-26 DIAGNOSIS — Z794 Long term (current) use of insulin: Secondary | ICD-10-CM | POA: Insufficient documentation

## 2019-07-26 DIAGNOSIS — Z7982 Long term (current) use of aspirin: Secondary | ICD-10-CM | POA: Insufficient documentation

## 2019-07-26 DIAGNOSIS — Z79899 Other long term (current) drug therapy: Secondary | ICD-10-CM | POA: Insufficient documentation

## 2019-07-26 DIAGNOSIS — E119 Type 2 diabetes mellitus without complications: Secondary | ICD-10-CM | POA: Insufficient documentation

## 2019-07-26 NOTE — Progress Notes (Addendum)
PCP -  Cardiologist - Dr. Tamala Julian. VCB:SWHQPRF clearance. 06/24/2019  Chest x-ray -  EKG -06/24/2019  Stress Test -  ECHO -  Cardiac Cath -   Sleep Study -  CPAP -   Fasting Blood Sugar -  Checks Blood Sugar  3-4  times a day  Blood Thinner Instructions:By Dr. Louis Meckel Aspirin Instructions: stop on 05/20/2019 Last Dose:  Anesthesia review:   Patient denies shortness of breath, fever, cough and chest pain at PAT appointment   Patient verbalized understanding of instructions that were given to them at the PAT appointment. Patient was also instructed that they will need to review over the PAT instructions again at home before surgery.

## 2019-07-27 ENCOUNTER — Other Ambulatory Visit (HOSPITAL_COMMUNITY)
Admission: RE | Admit: 2019-07-27 | Discharge: 2019-07-27 | Disposition: A | Discharge status: Home / Self Care | Payer: Medicare Other | Source: Ambulatory Visit | Attending: Urology | Admitting: Urology

## 2019-07-27 DIAGNOSIS — Z20828 Contact with and (suspected) exposure to other viral communicable diseases: Secondary | ICD-10-CM | POA: Insufficient documentation

## 2019-07-27 DIAGNOSIS — Z01812 Encounter for preprocedural laboratory examination: Secondary | ICD-10-CM | POA: Diagnosis present

## 2019-07-28 LAB — NOVEL CORONAVIRUS, NAA (HOSP ORDER, SEND-OUT TO REF LAB; TAT 18-24 HRS): SARS-CoV-2, NAA: NOT DETECTED

## 2019-07-29 ENCOUNTER — Other Ambulatory Visit: Payer: Self-pay

## 2019-07-29 ENCOUNTER — Encounter (HOSPITAL_COMMUNITY)
Admission: RE | Admit: 2019-07-29 | Discharge: 2019-07-29 | Disposition: A | Discharge status: Home / Self Care | Payer: Medicare Other | Source: Ambulatory Visit | Attending: Urology | Admitting: Urology

## 2019-07-29 DIAGNOSIS — Z01812 Encounter for preprocedural laboratory examination: Secondary | ICD-10-CM | POA: Diagnosis not present

## 2019-07-29 LAB — COMPREHENSIVE METABOLIC PANEL
ALT: 15 U/L (ref 0–44)
AST: 22 U/L (ref 15–41)
Albumin: 3.7 g/dL (ref 3.5–5.0)
Alkaline Phosphatase: 54 U/L (ref 38–126)
Anion gap: 10 (ref 5–15)
BUN: 24 mg/dL — ABNORMAL HIGH (ref 8–23)
CO2: 25 mmol/L (ref 22–32)
Calcium: 8.8 mg/dL — ABNORMAL LOW (ref 8.9–10.3)
Chloride: 102 mmol/L (ref 98–111)
Creatinine, Ser: 1.42 mg/dL — ABNORMAL HIGH (ref 0.44–1.00)
GFR calc Af Amer: 42 mL/min — ABNORMAL LOW (ref >60)
GFR calc non Af Amer: 36 mL/min — ABNORMAL LOW (ref >60)
Glucose, Bld: 225 mg/dL — ABNORMAL HIGH (ref 70–99)
Potassium: 3.9 mmol/L (ref 3.5–5.1)
Sodium: 137 mmol/L (ref 135–145)
Total Bilirubin: 1 mg/dL (ref 0.3–1.2)
Total Protein: 7 g/dL (ref 6.5–8.1)

## 2019-07-29 LAB — CBC
HCT: 33.1 % — ABNORMAL LOW (ref 36.0–46.0)
Hemoglobin: 11 g/dL — ABNORMAL LOW (ref 12.0–15.0)
MCH: 28.6 pg (ref 26.0–34.0)
MCHC: 33.2 g/dL (ref 30.0–36.0)
MCV: 86.2 fL (ref 80.0–100.0)
Platelets: 245 10*3/uL (ref 150–400)
RBC: 3.84 MIL/uL — ABNORMAL LOW (ref 3.87–5.11)
RDW: 13.4 % (ref 11.5–15.5)
WBC: 7 10*3/uL (ref 4.0–10.5)
nRBC: 0 % (ref 0.0–0.2)

## 2019-07-29 LAB — GLUCOSE, CAPILLARY: Glucose-Capillary: 223 mg/dL — ABNORMAL HIGH (ref 70–99)

## 2019-07-29 LAB — HEMOGLOBIN A1C
Hgb A1c MFr Bld: 7.3 % — ABNORMAL HIGH (ref 4.8–5.6)
Mean Plasma Glucose: 162.81 mg/dL

## 2019-07-29 NOTE — Progress Notes (Signed)
Anesthesia Chart Review   Case: [id number] Date/Time: 07/31/19 0700   Procedures:      XI ROBOTIC ASSISTED LAPAROSCOPIC SACROCOLPOPEXY WIHT SUPRACERVICAL HYSTERECTOMY (N/A )     CYSTOSCOPY MID URETHRAL  SLING (N/A )   Anesthesia type: General   Pre-op diagnosis: PELVIC ORGAN PROLAPSE   Location: [facility] ROOM 03 / [facility]   Surgeons: Crist Fat, MD       DISCUSSION:73 y.o. former smoker (10 pack years, quit 01/02/97) with h/o DM, HTN, CHF, CAD (CABG 2018), stroke 2013, pelvic organ prolapse scheduled for above procedure 07/31/2019 with Dr. Berniece Salines.   Pt last seen by cardiologist, Dr. Verdis Prime, 06/24/2019.  Per OV note, "She is cleared for upcoming robotic gynecologic/urologic procedure.  From cardiac standpoint, the risk will be low."  Anticipate pt can proceed with planned procedure barring acute status change.   VS: There were no vitals taken for this visit.  PROVIDERS: Renford Dills, MD is PCP   Verdis Prime, MD is Cardiologist  LABS: Labs reviewed: Acceptable for surgery. (all labs ordered are listed, but only abnormal results are displayed)  Labs Reviewed - No data to display   IMAGES:   EKG: 06/24/2019 Rate 72 bpm  Normal sinus rhythm  Septal infarct, age undetermined   CV: Echo 05/02/2017 Study Conclusions   - Left ventricle: The cavity size was normal. Wall thickness was   increased in a pattern of mild LVH. Systolic function was normal.   The estimated ejection fraction was in the range of 60% to 65%.   There is hypokinesis of the basalinferolateral myocardium.   Doppler parameters are consistent with abnormal left ventricular   relaxation (grade 1 diastolic dysfunction). Doppler parameters   are consistent with high ventricular filling pressure. - Mitral valve: Calcified annulus. There was mild regurgitation.   Impressions:   - Hypokinesis of the basal inferolateral wall with overall normal   LV systolic function; mild diastolic  dysfunction; mild LVH; mild   MR and TR.  Cardiac Cath 04/17/2017 Prox RCA lesion, 95 %stenosed. 1st RPLB lesion, 90 %stenosed. 2nd RPLB lesion, 95 %stenosed. Ost Cx to Dist Cx lesion, 100 %stenosed. Ost 3rd Mrg to 3rd Mrg lesion, 100 %stenosed - fills via L-R collaterals Ost 1st Diag to 1st Diag lesion, 90 %stenosed. 1st Diag lesion, 65 %stenosed. BIFURCATION LESION: Mid LAD lesion, 95 %stenosed. Ost 2nd Diag lesion, 99 %stenosed. 2nd Diag lesion, 80 %stenosed. The left ventricular systolic function is normal. The left ventricular ejection fraction is 50-55% by visual estimate. LV end diastolic pressure is mildly elevated.  Myocardial Perfusion 12/24/2015 Nuclear stress EF: 57%. Normal LV EF There was no ST segment deviation noted during stress. The study is normal. No ischemia. no infarction This is a low risk study. Past Medical History:  Diagnosis Date   Anemia 04/30/2017   CHF (congestive heart failure) (HCC)    Diabetes mellitus    Hypertension    Hypokalemia 04/2017   Macular degeneration disease    loss of central vision on left and decreased peripheral vision.    Stroke (HCC) 12/2011   no residuals noted    Past Surgical History:  Procedure Laterality Date   CHOLECYSTECTOMY     CORONARY ARTERY BYPASS GRAFT N/A 04/18/2017   Procedure: CORONARY ARTERY BYPASS GRAFTING (CABG) times four using left internal mammary artery and right leg saphenous vein;  Surgeon: Kerin Perna, MD;  Location: [facility] OR;  Service: Open Heart Surgery;  Laterality: N/A;   EYE SURGERY  right   LEFT HEART CATH AND CORONARY ANGIOGRAPHY N/A 04/17/2017   Procedure: LEFT HEART CATH AND CORONARY ANGIOGRAPHY;  Surgeon: Leonie Man, MD;  Location: [facility];  Service: Cardiovascular;  Laterality: N/A;   REFRACTIVE SURGERY Left    ROTATOR CUFF REPAIR     left   TEE WITHOUT CARDIOVERSION N/A 04/18/2017   Procedure: TRANSESOPHAGEAL ECHOCARDIOGRAM (TEE);  Surgeon: Prescott Gum, Collier Salina, MD;   Location: [facility];  Service: Open Heart Surgery;  Laterality: N/A;    MEDICATIONS: No current facility-administered medications for this encounter.    aspirin EC 81 MG tablet   carvedilol (COREG) 6.25 MG tablet   Cholecalciferol (VITAMIN D) 2000 UNITS tablet   HUMALOG KWIKPEN 100 UNIT/ML KiwkPen   JANUMET XR 50-500 MG TB24   PREMARIN vaginal cream   TRESIBA FLEXTOUCH 100 UNIT/ML SOPN FlexTouch Pen   VASCEPA 1 g CAPS   JANUVIA 100 MG tablet   MYRBETRIQ 25 MG TB24 tablet   NOVOFINE PLUS 32G X 4 MM MISC   Olmesartan-amLODIPine-HCTZ 40-5-25 MG TABS   ONETOUCH VERIO test strip   Konrad Felix, PA-C [facility] Pre-Surgical Testing [phone] 07/29/19  10:59 AM

## 2019-07-30 LAB — URINE CULTURE: Culture: NO GROWTH

## 2019-07-31 ENCOUNTER — Encounter (HOSPITAL_COMMUNITY)
Admission: RE | Disposition: A | Discharge status: Home / Self Care | Payer: Self-pay | Source: Home / Self Care | Attending: Urology

## 2019-07-31 ENCOUNTER — Encounter (HOSPITAL_COMMUNITY): Payer: Self-pay | Admitting: Urology

## 2019-07-31 ENCOUNTER — Other Ambulatory Visit: Payer: Self-pay

## 2019-07-31 ENCOUNTER — Inpatient Hospital Stay (HOSPITAL_COMMUNITY): Payer: Medicare Other | Admitting: Physician Assistant

## 2019-07-31 ENCOUNTER — Inpatient Hospital Stay (HOSPITAL_COMMUNITY)
Admission: RE | Admit: 2019-07-31 | Discharge: 2019-08-01 | DRG: 743 | Disposition: A | Discharge status: Home / Self Care | Payer: Medicare Other | Attending: Urology | Admitting: Urology

## 2019-07-31 DIAGNOSIS — N3281 Overactive bladder: Secondary | ICD-10-CM | POA: Diagnosis present

## 2019-07-31 DIAGNOSIS — Z8673 Personal history of transient ischemic attack (TIA), and cerebral infarction without residual deficits: Secondary | ICD-10-CM | POA: Diagnosis not present

## 2019-07-31 DIAGNOSIS — Z7982 Long term (current) use of aspirin: Secondary | ICD-10-CM

## 2019-07-31 DIAGNOSIS — K469 Unspecified abdominal hernia without obstruction or gangrene: Secondary | ICD-10-CM | POA: Diagnosis present

## 2019-07-31 DIAGNOSIS — M1990 Unspecified osteoarthritis, unspecified site: Secondary | ICD-10-CM | POA: Diagnosis present

## 2019-07-31 DIAGNOSIS — Z833 Family history of diabetes mellitus: Secondary | ICD-10-CM

## 2019-07-31 DIAGNOSIS — N3946 Mixed incontinence: Secondary | ICD-10-CM | POA: Diagnosis present

## 2019-07-31 DIAGNOSIS — I2510 Atherosclerotic heart disease of native coronary artery without angina pectoris: Secondary | ICD-10-CM | POA: Diagnosis present

## 2019-07-31 DIAGNOSIS — E119 Type 2 diabetes mellitus without complications: Secondary | ICD-10-CM | POA: Diagnosis present

## 2019-07-31 DIAGNOSIS — M109 Gout, unspecified: Secondary | ICD-10-CM | POA: Diagnosis present

## 2019-07-31 DIAGNOSIS — Z79899 Other long term (current) drug therapy: Secondary | ICD-10-CM

## 2019-07-31 DIAGNOSIS — Z8052 Family history of malignant neoplasm of bladder: Secondary | ICD-10-CM

## 2019-07-31 DIAGNOSIS — D509 Iron deficiency anemia, unspecified: Secondary | ICD-10-CM | POA: Diagnosis present

## 2019-07-31 DIAGNOSIS — E669 Obesity, unspecified: Secondary | ICD-10-CM | POA: Diagnosis present

## 2019-07-31 DIAGNOSIS — N736 Female pelvic peritoneal adhesions (postinfective): Secondary | ICD-10-CM | POA: Diagnosis present

## 2019-07-31 DIAGNOSIS — I119 Hypertensive heart disease without heart failure: Secondary | ICD-10-CM | POA: Diagnosis present

## 2019-07-31 DIAGNOSIS — Z9049 Acquired absence of other specified parts of digestive tract: Secondary | ICD-10-CM

## 2019-07-31 DIAGNOSIS — Z794 Long term (current) use of insulin: Secondary | ICD-10-CM

## 2019-07-31 DIAGNOSIS — Z951 Presence of aortocoronary bypass graft: Secondary | ICD-10-CM | POA: Diagnosis not present

## 2019-07-31 DIAGNOSIS — Z87442 Personal history of urinary calculi: Secondary | ICD-10-CM | POA: Diagnosis not present

## 2019-07-31 DIAGNOSIS — H3530 Unspecified macular degeneration: Secondary | ICD-10-CM | POA: Diagnosis present

## 2019-07-31 DIAGNOSIS — I252 Old myocardial infarction: Secondary | ICD-10-CM | POA: Diagnosis not present

## 2019-07-31 DIAGNOSIS — Z6832 Body mass index (BMI) 32.0-32.9, adult: Secondary | ICD-10-CM | POA: Diagnosis not present

## 2019-07-31 DIAGNOSIS — Z87891 Personal history of nicotine dependence: Secondary | ICD-10-CM | POA: Diagnosis not present

## 2019-07-31 DIAGNOSIS — N814 Uterovaginal prolapse, unspecified: Principal | ICD-10-CM | POA: Diagnosis present

## 2019-07-31 HISTORY — PX: ROBOTIC ASSISTED LAPAROSCOPIC SACROCOLPOPEXY: SHX5388

## 2019-07-31 HISTORY — PX: PUBOVAGINAL SLING: SHX1035

## 2019-07-31 LAB — GLUCOSE, CAPILLARY
Glucose-Capillary: 128 mg/dL — ABNORMAL HIGH (ref 70–99)
Glucose-Capillary: 165 mg/dL — ABNORMAL HIGH (ref 70–99)
Glucose-Capillary: 175 mg/dL — ABNORMAL HIGH (ref 70–99)
Glucose-Capillary: 188 mg/dL — ABNORMAL HIGH (ref 70–99)
Glucose-Capillary: 199 mg/dL — ABNORMAL HIGH (ref 70–99)

## 2019-07-31 LAB — HEMOGLOBIN AND HEMATOCRIT, BLOOD
HCT: 32.8 % — ABNORMAL LOW (ref 36.0–46.0)
Hemoglobin: 10.8 g/dL — ABNORMAL LOW (ref 12.0–15.0)

## 2019-07-31 SURGERY — SACROCOLPOPEXY, ROBOT-ASSISTED, LAPAROSCOPIC
Anesthesia: General

## 2019-07-31 MED ORDER — BUPIVACAINE HCL (PF) 0.5 % IJ SOLN
INTRAMUSCULAR | Status: AC
  Filled 2019-07-31: qty 30

## 2019-07-31 MED ORDER — DOCUSATE SODIUM 100 MG PO CAPS
100.0000 mg | ORAL_CAPSULE | Freq: Two times a day (BID) | ORAL | Status: DC
  Administered 2019-07-31 – 2019-08-01 (×2): 100 mg via ORAL
  Filled 2019-07-31 (×2): qty 1

## 2019-07-31 MED ORDER — ACETAMINOPHEN 325 MG PO TABS
650.0000 mg | ORAL_TABLET | ORAL | Status: DC
  Administered 2019-07-31 – 2019-08-01 (×6): 650 mg via ORAL
  Filled 2019-07-31 (×6): qty 2

## 2019-07-31 MED ORDER — GLYCOPYRROLATE PF 0.2 MG/ML IJ SOSY
PREFILLED_SYRINGE | INTRAMUSCULAR | Status: DC | PRN
  Administered 2019-07-31: .2 mg via INTRAVENOUS

## 2019-07-31 MED ORDER — SODIUM CHLORIDE (PF) 0.9 % IJ SOLN
INTRAMUSCULAR | Status: AC
  Filled 2019-07-31: qty 20

## 2019-07-31 MED ORDER — INSULIN ASPART 100 UNIT/ML ~~LOC~~ SOLN
0.0000 [IU] | Freq: Three times a day (TID) | SUBCUTANEOUS | Status: DC
  Administered 2019-07-31 – 2019-08-01 (×2): 3 [IU] via SUBCUTANEOUS
  Administered 2019-08-01: 5 [IU] via SUBCUTANEOUS

## 2019-07-31 MED ORDER — MAGNESIUM CITRATE PO SOLN: 1.0000 | Freq: Once | ORAL | Status: DC

## 2019-07-31 MED ORDER — BELLADONNA ALKALOIDS-OPIUM 16.2-60 MG RE SUPP: 1.0000 | Freq: Four times a day (QID) | RECTAL | Status: DC | PRN

## 2019-07-31 MED ORDER — LACTATED RINGERS IV SOLN: INTRAVENOUS | Status: DC | PRN

## 2019-07-31 MED ORDER — SODIUM CHLORIDE 0.45 % IV SOLN: INTRAVENOUS | Status: DC

## 2019-07-31 MED ORDER — FENTANYL CITRATE (PF) 100 MCG/2ML IJ SOLN
INTRAMUSCULAR | Status: DC | PRN
  Administered 2019-07-31: 50 ug via INTRAVENOUS
  Administered 2019-07-31: 150 ug via INTRAVENOUS
  Administered 2019-07-31: 50 ug via INTRAVENOUS

## 2019-07-31 MED ORDER — KETOROLAC TROMETHAMINE 15 MG/ML IJ SOLN
15.0000 mg | Freq: Four times a day (QID) | INTRAMUSCULAR | Status: DC
  Administered 2019-07-31 – 2019-08-01 (×4): 15 mg via INTRAVENOUS
  Filled 2019-07-31 (×4): qty 1

## 2019-07-31 MED ORDER — HEPARIN SODIUM (PORCINE) 5000 UNIT/ML IJ SOLN
5000.0000 [IU] | Freq: Three times a day (TID) | INTRAMUSCULAR | Status: DC
  Administered 2019-08-01 (×2): 5000 [IU] via SUBCUTANEOUS
  Filled 2019-07-31 (×2): qty 1

## 2019-07-31 MED ORDER — ACETAMINOPHEN 10 MG/ML IV SOLN: 1000.0000 mg | Freq: Once | INTRAVENOUS | Status: DC | PRN

## 2019-07-31 MED ORDER — ESTRADIOL 0.1 MG/GM VA CREA
TOPICAL_CREAM | VAGINAL | Status: AC
  Filled 2019-07-31: qty 42.5

## 2019-07-31 MED ORDER — ONDANSETRON HCL 4 MG/2ML IJ SOLN
INTRAMUSCULAR | Status: DC | PRN
  Administered 2019-07-31: 4 mg via INTRAVENOUS

## 2019-07-31 MED ORDER — PROMETHAZINE HCL 25 MG/ML IJ SOLN: 6.2500 mg | INTRAMUSCULAR | Status: DC | PRN

## 2019-07-31 MED ORDER — CEFAZOLIN SODIUM-DEXTROSE 2-4 GM/100ML-% IV SOLN
2.0000 g | INTRAVENOUS | Status: AC
  Administered 2019-07-31: 08:00:00 2 g via INTRAVENOUS
  Filled 2019-07-31: qty 100

## 2019-07-31 MED ORDER — FENTANYL CITRATE (PF) 250 MCG/5ML IJ SOLN
INTRAMUSCULAR | Status: AC
  Filled 2019-07-31: qty 5

## 2019-07-31 MED ORDER — ROCURONIUM BROMIDE 10 MG/ML (PF) SYRINGE
PREFILLED_SYRINGE | INTRAVENOUS | Status: AC
  Filled 2019-07-31: qty 30

## 2019-07-31 MED ORDER — LACTATED RINGERS IV SOLN: INTRAVENOUS | Status: DC

## 2019-07-31 MED ORDER — LIDOCAINE 2% (20 MG/ML) 5 ML SYRINGE
INTRAMUSCULAR | Status: DC | PRN
  Administered 2019-07-31: 1.5 mg/kg/h via INTRAVENOUS
  Administered 2019-07-31: 100 mg via INTRAVENOUS

## 2019-07-31 MED ORDER — DEXAMETHASONE SODIUM PHOSPHATE 10 MG/ML IJ SOLN
INTRAMUSCULAR | Status: DC | PRN
  Administered 2019-07-31: 5 mg via INTRAVENOUS

## 2019-07-31 MED ORDER — EPHEDRINE 5 MG/ML INJ
INTRAVENOUS | Status: AC
  Filled 2019-07-31: qty 10

## 2019-07-31 MED ORDER — MIRABEGRON ER 25 MG PO TB24
25.0000 mg | ORAL_TABLET | Freq: Every day | ORAL | Status: DC
  Administered 2019-07-31 – 2019-08-01 (×2): 25 mg via ORAL
  Filled 2019-07-31 (×2): qty 1

## 2019-07-31 MED ORDER — GLYCOPYRROLATE PF 0.2 MG/ML IJ SOSY
PREFILLED_SYRINGE | INTRAMUSCULAR | Status: AC
  Filled 2019-07-31: qty 2

## 2019-07-31 MED ORDER — ROCURONIUM BROMIDE 10 MG/ML (PF) SYRINGE
PREFILLED_SYRINGE | INTRAVENOUS | Status: DC | PRN
  Administered 2019-07-31: 20 mg via INTRAVENOUS
  Administered 2019-07-31: 30 mg via INTRAVENOUS
  Administered 2019-07-31: 50 mg via INTRAVENOUS
  Administered 2019-07-31: 20 mg via INTRAVENOUS

## 2019-07-31 MED ORDER — SUGAMMADEX SODIUM 200 MG/2ML IV SOLN
INTRAVENOUS | Status: DC | PRN
  Administered 2019-07-31: 350 mg via INTRAVENOUS

## 2019-07-31 MED ORDER — BUPIVACAINE HCL (PF) 0.5 % IJ SOLN
INTRAMUSCULAR | Status: DC | PRN
  Administered 2019-07-31: 20 mL

## 2019-07-31 MED ORDER — PROPOFOL 10 MG/ML IV BOLUS
INTRAVENOUS | Status: DC | PRN
  Administered 2019-07-31: 150 mg via INTRAVENOUS

## 2019-07-31 MED ORDER — SODIUM CHLORIDE 0.9 % IV SOLN
INTRAVENOUS | Status: AC
  Filled 2019-07-31: qty 500000

## 2019-07-31 MED ORDER — CIPROFLOXACIN IN D5W 200 MG/100ML IV SOLN
200.0000 mg | Freq: Two times a day (BID) | INTRAVENOUS | Status: DC
  Administered 2019-07-31 – 2019-08-01 (×2): 200 mg via INTRAVENOUS
  Filled 2019-07-31 (×3): qty 100

## 2019-07-31 MED ORDER — HYDROMORPHONE HCL 1 MG/ML IJ SOLN
0.2500 mg | INTRAMUSCULAR | Status: DC | PRN
  Administered 2019-07-31 (×2): 0.25 mg via INTRAVENOUS

## 2019-07-31 MED ORDER — PROPOFOL 10 MG/ML IV BOLUS
INTRAVENOUS | Status: AC
  Filled 2019-07-31: qty 40

## 2019-07-31 MED ORDER — MORPHINE SULFATE (PF) 4 MG/ML IV SOLN: 2.0000 mg | INTRAVENOUS | Status: DC | PRN

## 2019-07-31 MED ORDER — DIPHENHYDRAMINE HCL 12.5 MG/5ML PO ELIX: 12.5000 mg | ORAL_SOLUTION | Freq: Four times a day (QID) | ORAL | Status: DC | PRN

## 2019-07-31 MED ORDER — LIDOCAINE HCL 2 % IJ SOLN
INTRAMUSCULAR | Status: AC
  Filled 2019-07-31: qty 20

## 2019-07-31 MED ORDER — LIDOCAINE 2% (20 MG/ML) 5 ML SYRINGE
INTRAMUSCULAR | Status: AC
  Filled 2019-07-31: qty 10

## 2019-07-31 MED ORDER — EPHEDRINE SULFATE-NACL 50-0.9 MG/10ML-% IV SOSY
PREFILLED_SYRINGE | INTRAVENOUS | Status: DC | PRN
  Administered 2019-07-31: 5 mg via INTRAVENOUS
  Administered 2019-07-31: 10 mg via INTRAVENOUS
  Administered 2019-07-31 (×5): 5 mg via INTRAVENOUS

## 2019-07-31 MED ORDER — SULFAMETHOXAZOLE-TRIMETHOPRIM 800-160 MG PO TABS: 1.0000 | ORAL_TABLET | Freq: Two times a day (BID) | ORAL | 0 refills | Status: DC

## 2019-07-31 MED ORDER — HYDROMORPHONE HCL 1 MG/ML IJ SOLN
INTRAMUSCULAR | Status: AC
  Filled 2019-07-31: qty 1

## 2019-07-31 MED ORDER — BUPIVACAINE LIPOSOME 1.3 % IJ SUSP
20.0000 mL | Freq: Once | INTRAMUSCULAR | Status: AC
  Administered 2019-07-31: 20 mL
  Filled 2019-07-31: qty 20

## 2019-07-31 MED ORDER — TRAMADOL HCL 50 MG PO TABS: 50.0000 mg | ORAL_TABLET | Freq: Four times a day (QID) | ORAL | 0 refills | Status: DC | PRN

## 2019-07-31 MED ORDER — CARVEDILOL 6.25 MG PO TABS
6.2500 mg | ORAL_TABLET | Freq: Two times a day (BID) | ORAL | Status: DC
  Administered 2019-07-31 – 2019-08-01 (×2): 6.25 mg via ORAL
  Filled 2019-07-31 (×2): qty 1

## 2019-07-31 MED ORDER — CHLORHEXIDINE GLUCONATE CLOTH 2 % EX PADS
6.0000 | MEDICATED_PAD | Freq: Every day | CUTANEOUS | Status: DC
  Administered 2019-07-31 – 2019-08-01 (×2): 6 via TOPICAL

## 2019-07-31 MED ORDER — CLINDAMYCIN PHOSPHATE 2 % VA CREA
TOPICAL_CREAM | VAGINAL | Status: AC
  Filled 2019-07-31: qty 40

## 2019-07-31 MED ORDER — MIDAZOLAM HCL 2 MG/2ML IJ SOLN
INTRAMUSCULAR | Status: AC
  Filled 2019-07-31: qty 2

## 2019-07-31 MED ORDER — DIPHENHYDRAMINE HCL 50 MG/ML IJ SOLN: 12.5000 mg | Freq: Four times a day (QID) | INTRAMUSCULAR | Status: DC | PRN

## 2019-07-31 MED ORDER — MIDAZOLAM HCL 5 MG/5ML IJ SOLN
INTRAMUSCULAR | Status: DC | PRN
  Administered 2019-07-31: 1 mg via INTRAVENOUS

## 2019-07-31 MED ORDER — MEPERIDINE HCL 50 MG/ML IJ SOLN: 6.2500 mg | INTRAMUSCULAR | Status: DC | PRN

## 2019-07-31 MED ORDER — TRAMADOL HCL 50 MG PO TABS: 50.0000 mg | ORAL_TABLET | Freq: Four times a day (QID) | ORAL | Status: DC | PRN

## 2019-07-31 MED ORDER — STERILE WATER FOR IRRIGATION IR SOLN
Status: DC | PRN
  Administered 2019-07-31: 1000 mL

## 2019-07-31 MED ORDER — LACTATED RINGERS IR SOLN
Status: DC | PRN
  Administered 2019-07-31: 1000 mL

## 2019-07-31 SURGICAL SUPPLY — 90 items
ADH SKN CLS APL DERMABOND .7 (GAUZE/BANDAGES/DRESSINGS) ×1
APL PRP STRL LF DISP 70% ISPRP (MISCELLANEOUS) ×1
BAG DRN RND TRDRP ANRFLXCHMBR (UROLOGICAL SUPPLIES) ×1
BAG SPEC RTRVL LRG 6X4 10 (ENDOMECHANICALS) ×1
BAG URINE DRAIN 2000ML AR STRL (UROLOGICAL SUPPLIES) ×1 IMPLANT
BLADE HEX COATED 2.75 (ELECTRODE) ×1 IMPLANT
BLADE SURG 15 STRL LF DISP TIS (BLADE) ×2 IMPLANT
BLADE SURG 15 STRL SS (BLADE) ×4
CATH FOLEY 2WAY 5CC 16FR (CATHETERS) ×2
CATH FOLEY 2WAY SLVR  5CC 16FR (CATHETERS)
CATH FOLEY 2WAY SLVR 5CC 16FR (CATHETERS) ×1 IMPLANT
CATH URTH STD 16FR FL 2W DRN (CATHETERS) ×1 IMPLANT
CHLORAPREP W/TINT 26 (MISCELLANEOUS) ×2 IMPLANT
CLIP VESOLOCK LG 6/CT PURPLE (CLIP) ×2 IMPLANT
CLIP VESOLOCK MED LG 6/CT (CLIP) ×1 IMPLANT
COVER MAYO STAND STRL (DRAPES) IMPLANT
COVER SURGICAL LIGHT HANDLE (MISCELLANEOUS) ×2 IMPLANT
COVER TIP SHEARS 8 DVNC (MISCELLANEOUS) ×1 IMPLANT
COVER TIP SHEARS 8MM DA VINCI (MISCELLANEOUS) ×1
COVER WAND RF STERILE (DRAPES) IMPLANT
DECANTER SPIKE VIAL GLASS SM (MISCELLANEOUS) ×2 IMPLANT
DERMABOND ADVANCED (GAUZE/BANDAGES/DRESSINGS) ×1
DERMABOND ADVANCED .7 DNX12 (GAUZE/BANDAGES/DRESSINGS) ×1 IMPLANT
DRAIN CHANNEL RND F F (WOUND CARE) IMPLANT
DRAPE ARM DVNC X/XI (DISPOSABLE) ×4 IMPLANT
DRAPE COLUMN DVNC XI (DISPOSABLE) ×1 IMPLANT
DRAPE DA VINCI XI ARM (DISPOSABLE) ×4
DRAPE DA VINCI XI COLUMN (DISPOSABLE) ×1
DRAPE INCISE IOBAN 66X45 STRL (DRAPES) ×2 IMPLANT
DRAPE SHEET LG 3/4 BI-LAMINATE (DRAPES) ×4 IMPLANT
DRAPE SURG IRRIG POUCH 19X23 (DRAPES) ×2 IMPLANT
ELECT REM PT RETURN 15FT ADLT (MISCELLANEOUS) ×2 IMPLANT
GAUZE 4X4 16PLY RFD (DISPOSABLE) ×3 IMPLANT
GAUZE PACKING 2X5 YD STRL (GAUZE/BANDAGES/DRESSINGS) ×2 IMPLANT
GLOVE BIO SURGEON STRL SZ 6.5 (GLOVE) ×2 IMPLANT
GLOVE BIOGEL M STRL SZ7.5 (GLOVE) ×5 IMPLANT
GLOVE BIOGEL PI IND STRL 8 (GLOVE) ×1 IMPLANT
GLOVE BIOGEL PI INDICATOR 8 (GLOVE) ×1
GOWN STRL REUS W/ TWL XL LVL3 (GOWN DISPOSABLE) ×2 IMPLANT
GOWN STRL REUS W/TWL LRG LVL3 (GOWN DISPOSABLE) ×4 IMPLANT
GOWN STRL REUS W/TWL XL LVL3 (GOWN DISPOSABLE) ×7 IMPLANT
HOLDER FOLEY CATH W/STRAP (MISCELLANEOUS) ×2 IMPLANT
IRRIG SUCT STRYKERFLOW 2 WTIP (MISCELLANEOUS) ×2
IRRIGATION SUCT STRKRFLW 2 WTP (MISCELLANEOUS) ×1 IMPLANT
KIT BASIN OR (CUSTOM PROCEDURE TRAY) ×2 IMPLANT
KIT TURNOVER KIT A (KITS) IMPLANT
MANIPULATOR UTERINE 4.5 ZUMI (MISCELLANEOUS) ×1 IMPLANT
MARKER SKIN DUAL TIP RULER LAB (MISCELLANEOUS) ×2 IMPLANT
MESH Y UPSYLON VAGINAL (Mesh General) ×1 IMPLANT
NEEDLE HYPO 22GX1.5 SAFETY (NEEDLE) IMPLANT
NS IRRIG 1000ML POUR BTL (IV SOLUTION) ×2 IMPLANT
OCCLUDER COLPOPNEUMO (BALLOONS) IMPLANT
PACK CYSTO (CUSTOM PROCEDURE TRAY) ×2 IMPLANT
PACKING VAGINAL (PACKING) ×1 IMPLANT
PAD POSITIONING PINK XL (MISCELLANEOUS) ×2 IMPLANT
PENCIL SMOKE EVACUATOR (MISCELLANEOUS) IMPLANT
PLUG CATH AND CAP STER (CATHETERS) ×2 IMPLANT
POUCH SPECIMEN RETRIEVAL 10MM (ENDOMECHANICALS) ×1 IMPLANT
RETRACTOR LONRSTAR 16.6X16.6CM (MISCELLANEOUS) ×1 IMPLANT
RETRACTOR STAY HOOK 5MM (MISCELLANEOUS) ×2 IMPLANT
RETRACTOR STER APS 16.6X16.6CM (MISCELLANEOUS) ×2
SEAL CANN UNIV 5-8 DVNC XI (MISCELLANEOUS) ×4 IMPLANT
SEAL XI 5MM-8MM UNIVERSAL (MISCELLANEOUS) ×4
SET IRRIG Y TYPE TUR BLADDER L (SET/KITS/TRAYS/PACK) ×1 IMPLANT
SET TUBE SMOKE EVAC HIGH FLOW (TUBING) ×2 IMPLANT
SHEET LAVH (DRAPES) ×2 IMPLANT
SLING LYNX SUPRAPUBIC (Sling) ×2 IMPLANT
SOLUTION ELECTROLUBE (MISCELLANEOUS) ×2 IMPLANT
SUT MNCRL AB 4-0 PS2 18 (SUTURE) ×4 IMPLANT
SUT PROLENE 2 0 CT 1 (SUTURE) ×2 IMPLANT
SUT VIC AB 0 CT1 27 (SUTURE) ×2
SUT VIC AB 0 CT1 27XBRD ANTBC (SUTURE) ×1 IMPLANT
SUT VIC AB 2-0 SH 27 (SUTURE) ×12
SUT VIC AB 2-0 SH 27XBRD (SUTURE) ×5 IMPLANT
SUT VIC AB 2-0 UR6 27 (SUTURE) ×2 IMPLANT
SUT VIC AB 3-0 SH 27 (SUTURE) ×8
SUT VIC AB 3-0 SH 27X BRD (SUTURE) IMPLANT
SUT VICRYL 0 UR6 27IN ABS (SUTURE) ×3 IMPLANT
SYR 10ML LL (SYRINGE) ×2 IMPLANT
SYR 50ML LL SCALE MARK (SYRINGE) IMPLANT
SYR BULB IRRIGATION 50ML (SYRINGE) IMPLANT
TOWEL OR 17X26 10 PK STRL BLUE (TOWEL DISPOSABLE) ×4 IMPLANT
TRAY LAPAROSCOPIC (CUSTOM PROCEDURE TRAY) ×2 IMPLANT
TROCAR ENDOPATH XCEL 12X100 BL (ENDOMECHANICALS) ×1 IMPLANT
TROCAR XCEL 12X100 BLDLESS (ENDOMECHANICALS) IMPLANT
TROCAR XCEL NON-BLD 5MMX100MML (ENDOMECHANICALS) ×1 IMPLANT
TUBING CONNECTING 10 (TUBING) ×2 IMPLANT
WATER STERILE IRR 1000ML POUR (IV SOLUTION) ×3 IMPLANT
WATER STERILE IRR 500ML POUR (IV SOLUTION) ×1 IMPLANT
YANKAUER SUCT BULB TIP 10FT TU (MISCELLANEOUS) ×2 IMPLANT

## 2019-07-31 NOTE — Interval H&P Note (Signed)
History and Physical Interval Note:  07/31/2019 7:14 AM  Andrea [name]  has presented today for surgery, with the diagnosis of PELVIC ORGAN PROLAPSE.  The various methods of treatment have been discussed with the patient and family. After consideration of risks, benefits and other options for treatment, the patient has consented to  Procedure(s): XI ROBOTIC ASSISTED LAPAROSCOPIC SACROCOLPOPEXY WIHT SUPRACERVICAL HYSTERECTOMY (N/A) CYSTOSCOPY MID URETHRAL  SLING (N/A) as a surgical intervention.  The patient's history has been reviewed, patient examined, no change in status, stable for surgery.  I have reviewed the patient's chart and labs.  Questions were answered to the patient's satisfaction.     Ardis Hughs

## 2019-07-31 NOTE — Anesthesia Procedure Notes (Signed)
Procedure Name: Intubation Date/Time: 07/31/2019 8:04 AM Performed by: Lavina Hamman, CRNA Pre-anesthesia Checklist: Patient identified, Emergency Drugs available, Suction available, Patient being monitored and Timeout performed Patient Re-evaluated:Patient Re-evaluated prior to induction Oxygen Delivery Method: Circle system utilized Preoxygenation: Pre-oxygenation with 100% oxygen Induction Type: IV induction Ventilation: Mask ventilation without difficulty Laryngoscope Size: Mac and 3 Grade View: Grade II Tube type: Oral Tube size: 7.5 mm Number of attempts: 1 Airway Equipment and Method: Stylet Placement Confirmation: ETT inserted through vocal cords under direct vision,  positive ETCO2,  CO2 detector and breath sounds checked- equal and bilateral Secured at: 22 cm Tube secured with: Tape Dental Injury: Teeth and Oropharynx as per pre-operative assessment

## 2019-07-31 NOTE — Transfer of Care (Signed)
Immediate Anesthesia Transfer of Care Note  Patient: Andrea [name]  Procedure(s) Performed: Procedure(s): XI ROBOTIC ASSISTED LAPAROSCOPIC SACROCOLPOPEXY WIHT SUPRACERVICAL HYSTERECTOMY (N/A) CYSTOSCOPY MID URETHRAL  SLING (N/A)  Patient Location: PACU  Anesthesia Type:General  Level of Consciousness:  sedated, patient cooperative and responds to stimulation  Airway & Oxygen Therapy:Patient Spontanous Breathing and Patient connected to face mask oxgen  Post-op Assessment:  Report given to PACU RN and Post -op Vital signs reviewed and stable  Post vital signs:  Reviewed and stable  Last Vitals:  Vitals:   07/31/19 0500  BP: 139/73  Pulse: 72  Resp: 16  Temp: 36.9 C  SpO2: 31%    Complications: No apparent anesthesia complications

## 2019-07-31 NOTE — Anesthesia Preprocedure Evaluation (Addendum)
Anesthesia Evaluation  Patient identified by MRN, date of birth, ID band Patient awake    Reviewed: Allergy & Precautions, H&P , NPO status , Patient's Chart, lab work & pertinent test results  Airway Mallampati: II  TM Distance: >3 FB Neck ROM: Full    Dental no notable dental hx. (+) Teeth Intact, Dental Advisory Given   Pulmonary neg pulmonary ROS, former smoker,    Pulmonary exam normal breath sounds clear to auscultation       Cardiovascular Exercise Tolerance: Good hypertension, Pt. on home beta blockers + CAD, + Past MI and + CABG  Normal cardiovascular exam Rhythm:Regular Rate:Normal     Neuro/Psych CVA negative psych ROS   GI/Hepatic negative GI ROS, Neg liver ROS,   Endo/Other  diabetes, Type 2, Insulin Dependent, Oral Hypoglycemic Agents  Renal/GU   negative genitourinary   Musculoskeletal   Abdominal (+) + obese,   Peds  Hematology negative hematology ROS (+) anemia ,   Anesthesia Other Findings cc:  ------------------------------------------------------------------- LV EF: 60% -   65%  ------------------------------------------------------------------- Indications:      CAD of native vessels 414.01.  ------------------------------------------------------------------- History:   PMH:   Angina pectoris.  Congestive heart failure. Stroke.  PMH:   Myocardial infarction.  Risk factors: Hypertension. Diabetes mellitus.  ------------------------------------------------------------------- Study Conclusions  - Left ventricle: The cavity size was normal. Wall thickness was   increased in a pattern of mild LVH. Systolic function was normal.   The estimated ejection fraction was in the range of 60% to 65%.   There is hypokinesis of the basalinferolateral myocardium.   Doppler parameters are consistent with abnormal left ventricular   relaxation (grade 1 diastolic dysfunction). Doppler parameters  are consistent with high ventricular filling pressure. - Mitral valve: Calcified annulus. There was mild regurgitation.  Impressions:   Reproductive/Obstetrics negative OB ROS                            Anesthesia Physical  Anesthesia Plan  ASA: III  Anesthesia Plan: General   Post-op Pain Management:    Induction: Intravenous  PONV Risk Score and Plan: 4 or greater and Treatment may vary due to age or medical condition and Ondansetron  Airway Management Planned: Oral ETT  Additional Equipment:   Intra-op Plan:   Post-operative Plan: Extubation in OR  Informed Consent: I have reviewed the patients History and Physical, chart, labs and discussed the procedure including the risks, benefits and alternatives for the proposed anesthesia with the patient or authorized representative who has indicated his/her understanding and acceptance.     Dental advisory given  Plan Discussed with: CRNA  Anesthesia Plan Comments:         Anesthesia Quick Evaluation

## 2019-07-31 NOTE — Op Note (Signed)
Preoperative diagnosis:  1. Pelvic organ prolapse 2. Stress urinary incontinence  Postoperative diagnosis:  1. Same  Procedure: 1. Robotic-assisted laparoscopic supracervical hysterectomy and salpingo-oopherectomy 2. Robotic-assisted laparoscopic sacrocolpopexy 3. Pubovaginal mid-urethral sling 4. cystoscopy  Surgeon: Crist Fat, MD First assistant: Lars Pinks, MD  Anesthesia: General  Complications: Small cystotomy - repaired in three layers, no leak when tested.  Intraoperative findings:  [employer] Upsilon Y mesh used for the sacrocolpopexy. Lynx mesh mid-urethral sling used. Large uterus, ovaries also taken.  EBL: 50 mL  Specimens: Supracervical hysterectomy and salpingo-ectomy  Indication: Andrea [name] is a 73 y.o. female patient with symptomatic pelvic organ prolapse and intrinsic urethral sphincter dysfunction.   After reviewing the management options for treatment, she elected to proceed with the above surgical procedure(s). We have discussed the potential benefits and risks of the procedure, side effects of the proposed treatment, the likelihood of the patient achieving the goals of the procedure, and any potential problems that might occur during the procedure or recuperation. Informed consent has been obtained.  Description of procedure:  The patient was taken to the operating room and general anesthesia was induced. The patient was placed in the dorsal lithotomy position, prepped and draped in the usual sterile fashion, and preoperative antibiotics were administered. A preoperative time-out was performed.   A Foley catheter was then placed and placed to gravity drainage.  The cervical os was dilated up to 24 french and then a balloon catheter placed into the uterus with a large cervical ring over the os, with the balloon inflated with 10cc of air.  This allowed for uterine manipulation. I then made a periumbilical incision carrying the dissection  down to the patient's fascia with electrocautery. Once to the fascia, the fascia was incised and a small puncture hole made in the peritoneum to allow passage of a 78mm port.  The abdomen was insufflated and the remaining ports placed under digital guidance. 2 ports were placed lateral to the umbilicus on the right proximally 10 cm apart. The most lateral port was approximately 3 cm above the anterior iliac spine. 2 additional ports were placed in the patient's right side in comparable positions to the most lateral port on the right was a 12 mm port.the robot was then docked at an angle from the leg obliquely along the side of the left leg.  We then began our surgery by cleaning up some of the pelvic adhesions to the small bowel and colon. Once this was completed I started dissecting at the sacral promontory located 3 cm medial to the location where the ureter crosses over the iliac vessels at the pelvic brim. The posterior peritoneum was incised and the sacral prominence cleared off an area taking care to avoid the middle sacral vessels and the iliac branches. I then created a posterior peritoneal tunnel starting at the sacral promontory and tunneling down the right pelvic sidewall down into the pelvis breaking back through the posterior peritoneum around the vesico-vaginal junction posteriorly. I then continued the posterior dissection retracting down on the rectum and finding the avascular plane between the posterior vaginal wall and the rectum. I carried this dissection down as far as I could to along the area of the perineal body.  Then focused my attention to the uterus and hysterectomy. I first started by taking the right round ligament with a series of by polar cautery. I then dissected the anterior leaf of the broad ligament slightly more proximal and then distally down across  the anterior mucosa salpinx and the internal cervical os. I then took of the uterine ovarian ligament on the right  and dissected free the right salpinx. Once the anterior leaf of the broad ligament had been completely dissected on the patient's right and a small bladder flap had been created anteriorly attention was turned to the left side where a similar dissection was carried out. I then turned my attention to the anterior plane between the anterior vaginal wall and the bladder. I was able to obtain access to the avascular plane and with a combination of both monopolar cautery and blunt dissection was able to clean and nice down to the bladder neck. I then turned my attention back to the patient's uterus and skeletonized the right uterine artery and vein and then took this with a series of bipolar moves. I then performed a similar uterus pedicle ligation on the left. This point I was able to identify the patient's cervix and came through supracervical with monopolar cautery once the uterus was freed from all its attachments it was pushed into the left paracolic gutter and our attention was turned to placing the wire mesh.  Mesh was measured at approximately 7 cm anteriorly and 7 cm posteriorly and I cut this on the back table. The mesh was then placed into the patient's abdomen through the assistant port and the anterior leaf was secured down onto the anterior vaginal wall with the apex at the bladder neck. The posterior leaf was then secured down on the posterior vaginal wall. These were sewn down with 2-0 Vicryl. Between 6 and 8 were done on each side. At this point I then went back to the previously dissected sacral promontory and posterior peritoneal tunnel and inserted a instrument through the tunnel and grasped the end of the mesh at the vaginal cuff and pull it up to the sacrum. I then checked to ensure that the sacral mesh was not too tight by performing a vaginal exam. I then secured the sacral leg of the mesh using a 0 Prolene. I then reapproximated the posterior peritoneum with a 2-0 Vicryl in a  running fashion around the sacral promontory. The pelvic peritoneum was closed using a pursestring. A small Endo Catch bag was then gently passed through the assistant port and the uterus was placed in the bag. The bag was then brought out through the camera port once the trochars were removed. We then made a slightly larger extraction incision to remove the uterus. The fascia was then closed with 0 Vicryl in a figure-of-eight fashion. The skin was closed with 4-0 Monocryl's. Dermabond was applied to the incision and exparel injected into the incisions.  The Foley catheter was then capped. The horseshoe [place] retractor was then applied to the drape and the Foley catheter was attached to it. Using the skin hooks for skin nodes were placed in the corners of the labia minora to open up the vaginal vestibule. 5 cc of local was then injected into the periurethral tissue. The suprapubic incisions were then marked out 1 cm lateral to midline and 1 cm above the pubic bone. Using a 15 blade a stab incision was made in both sides of midline. Gauze is placed over these areas to control the skin bleeding. A 1.5 cm incision was then made in the mid urethra through the vaginal mucosa. Using this Lee scissors dissection was then carried out. Her urethra we laterally on both sides to the endopelvic fascia. The dissection was completed  once there was enough space to place a finger through the incision on both sides of the urethra. Using the [facility] needle set both needles were passed through the stab incisions suprapubically down posterior to the pubic bone and then through the periurethral incisions using the index finger to guide the needle out of the incision. Once both needles had been placed the Foley catheter was removed and cystoscopy was performed. A 70 lens was passed gently through the patient's urethra and into the bladder under visual guidance. A 360 cystoscopic evaluation was performed.  There was no mucosal abnormalities or evidence of perforation from the needles. The cystoscope was then removed and the Foley catheter replaced. The bladder was then drained again and the Foley catheter. The ends of the sling were then attached to the needles and the needles pulled up through the retropubic space and out the suprapubic incision. Once the sling was noted to be centered the blue The apex of the sling was cut and the plastic sheath was then pulled out. The mesh was noted to be well seated around the urethra. A right angle was used to ensure that the proper amount of tension for the sling around the urethra applied. The sling was noted to be tension free and the periurethral area and well positioned. At this point copious amounts of double antibiotic irrigation was then used to irrigate the incision the periurethral space and the vagina. The incision was then closed with 2-0 Vicryl in a running fashion. The stab incisions in the suprapubic area were closed with Dermabond. The vagina was then packed with clindamycin impregnated vaginal packing. The patient was subsequently extubated and returned to the PACU in stable condition.   The patient was subsequently extubated and returned to the PACU in excellent condition.

## 2019-07-31 NOTE — H&P (Signed)
Andrea [name] is a 73 year old female with a long history of pelvic organ prolapse. She also has overactive bladder and mixed urinary incontinence. She was 1st noted to have prolapse in 2012. This progressed over time and in 2018 she started wearing a pessary. At this point the pessary is not helping her that much because it continues to fall out and require offices for placement. She is frustrated, and is uncomfortable from her prolapse. She is taking Premarin cream which may be helping her some. She has also noted some spotting on the underwear from irritation of the vaginal mucosa.   The patient also has a history of mixed urinary incontinence, but her history is most suggestive of urge incontinence. She does not leak much with laughing, sneezing, or bending over. She does have a lot of urinary urge and associated inability to hold her urine till she gets to the bathroom. She was taking VESIcare for quite a long time, but was recently switched to Myrbetriq by Dr. Arita Miss. She has not had cystoscopy or urodynamics.   The patient has a history of MI and had open heart surgery in September of 2018. She maintains her uterus. She has never had any pelvic surgery for. She also has a history of kidney stones.   Interval: Today the patient is here to follow up the urodynamic study. This demonstrated a normal bladder capacity with mild overactivity and severe intrinsic sphincter deficiency and stress incontinence. She denies any burning or dysuria. She denies any hematuria. She is eager to get her surgery over     ALLERGIES: No Allergies    MEDICATIONS: Myrbetriq 25 mg tablet, extended release 24 hr 1 tablet PO Daily  Vesicare 10 mg tablet  Aspirin 81 MG TABS Oral  Carvedilol 6.25 mg tablet  Humalog  Humalog  Janumet 50 mg-1,000 mg tablet  Olmesartan-Amlodipine-Hctz 40 mg-5 mg-25 mg tablet  Tizanidine Hcl 2 mg capsule  Tresiba Flextouch U-100  Vascepa 1 gram capsule  Vitamin D-3 TABS Oral     GU PSH:  Complex cystometrogram, w/ void pressure and urethral pressure profile studies, any technique - 07/16/2019 Complex Uroflow - 07/16/2019 Emg surf Electrd - 07/16/2019 Inject For cystogram - 07/16/2019 Intrabd voidng Press - 07/16/2019       PSH Notes: Cataract Surgery, Rotator Cuff Repair, Cholecystectomy, Shoulder Surgery, Open Heart Surgery 2018   NON-GU PSH: Cholecystectomy (open) - 2009     GU PMH: Mixed incontinence (Stable), We will try switching patient's VESIcare to Myrbetriq. She was given samples of 25 mg and will try this for 2 weeks to see if it helps improve her urinary urgency. - 06/04/2019, Urge and stress incontinence, - 2014 Overactive bladder, Overactive bladder - 2016 Urinary Tract Inf, Unspec site, Urinary tract infection - 2016 Cystocele, midline, Cystocele, midline - 2014 Hydronephrosis Unspec, Hydronephrosis - 2014 Nocturia, Nocturia - 2014 Renal calculus, Kidney stone on right side - 2014, Kidney stone on left side, - 2014 Stress Incontinence, Female stress incontinence - 2014 Ureteral calculus, Calculus of ureter - 2014 Urinary Frequency, Increased urinary frequency - 2014      PMH Notes:  2007-10-25 11:40:28 - Note: Arthritis   NON-GU PMH: Encounter for general adult medical examination without abnormal findings, Encounter for preventive health examination - 2016 Personal history of other diseases of the circulatory system, History of hypertension - 2014 Personal history of other endocrine, nutritional and metabolic disease, History of diabetes mellitus - 2014 Personal history of other specified conditions, History of heartburn - 2014 Personal  history of transient ischemic attack (TIA), and cerebral infarction without residual deficits, History of transient cerebral ischemia - 2014 Diabetes Type 2 Gout Hypertension    FAMILY HISTORY: Bladder Cancer - Runs In Family Diabetes - Father, Brother, [place] Status - Mother's Age - Father Family Health  Status Number - Father Father Deceased At Age47 ___ - Father Heart Disease - Brother, Aunt, Father   SOCIAL HISTORY: Marital Status: Married Current Smoking Status: Patient does not smoke anymore. Has not smoked since 09/08/1996. Smoked for 30 years. Smoked 1/2 pack per day.   Tobacco Use Assessment Completed: Used Tobacco in last 30 days? Has never drank.  Drinks 1 caffeinated drink per day.     Notes: Former smoker, Occupation:, Tobacco Use, Caffeine Use, Marital History - Currently Married, Alcohol Use   REVIEW OF SYSTEMS:    GU Review Female:   Patient denies frequent urination, hard to postpone urination, burning /pain with urination, get up at night to urinate, leakage of urine, stream starts and stops, trouble starting your stream, have to strain to urinate, and being pregnant.  Gastrointestinal (Upper):   Patient denies nausea, vomiting, and indigestion/ heartburn.  Gastrointestinal (Lower):   Patient denies diarrhea and constipation.  Constitutional:   Patient denies fever, night sweats, weight loss, and fatigue.  Skin:   Patient denies skin rash/ lesion and itching.  Eyes:   Patient denies blurred vision and double vision.  Ears/ Nose/ Throat:   Patient denies sore throat and sinus problems.  Hematologic/Lymphatic:   Patient denies swollen glands and easy bruising.  Cardiovascular:   Patient denies leg swelling and chest pains.  Respiratory:   Patient denies cough and shortness of breath.  Endocrine:   Patient denies excessive thirst.  Musculoskeletal:   Patient denies back pain and joint pain.  Neurological:   Patient denies headaches and dizziness.  Psychologic:   Patient denies depression and anxiety.   VITAL SIGNS:      07/23/2019 02:26 PM  Weight 162 lb / 73.48 kg  BP 125/75 mmHg  Pulse 58 /min  Temperature 97.8 F / 36.5 C   MULTI-SYSTEM PHYSICAL EXAMINATION:    Constitutional: Well-nourished. No physical deformities. Normally developed. Good grooming.  Respiratory:  Normal breath sounds. No labored breathing, no use of accessory muscles.   Cardiovascular: Regular rate and rhythm. No murmur, no gallop. Normal temperature, normal extremity pulses, no swelling, no varicosities.      PAST DATA REVIEWED:  Source Of History:  Patient  Lab Test Review:   PSA  Records Review:   Previous Doctor Records, Previous Patient Records, POC Tool  Urine Test Review:   Urinalysis   PROCEDURES:          Urinalysis Dipstick Dipstick Cont'd  Color: Yellow Bilirubin: Neg mg/dL  Appearance: Clear Ketones: Neg mg/dL  Specific Gravity: 1.020 Blood: Neg ery/uL  pH: <=5.0 Protein: Neg mg/dL  Glucose: Neg mg/dL Urobilinogen: 0.2 mg/dL    Nitrites: Neg    Leukocyte Esterase: Neg leu/uL    ASSESSMENT:      ICD-10 Details  1 GU:   Cystocele, midline - N81.11   2   Mixed incontinence - N39.46    PLAN:           Orders Labs Urine Culture          Schedule Return Visit/Planned Activity: Keep Scheduled Appointment - Office Visit          Document Letter(s):  Created for Patient: Clinical Summary  Notes:   I reviewed the patient's urodynamics with her, and recommended that we perform a mid urethral sling at the same time as her hysterectomy and robotic sacral colpopexy. I went through the surgery with her again in more detail and answered all her questions. She is ready to proceed as scheduled next week.   I did discuss with the patient the new operating room restrictions and told her that there is some risk that this surgery is canceled or postponed. We will contact her if that is true.

## 2019-07-31 NOTE — Progress Notes (Signed)
Unable to obtain oral or axillary temp after receiving pt from PACU. Rectal temp 95. Multiple warm blankets applied immediately, room temp increased to 80 degrees. Temp 95.3 when rechecked 30 mins later. Pt is alert but drowsy frequently. A&O to person, time and place but did not remember she had surgery. Pt c/o feeling "groggy" and is confused at times. Daughter did visit at bedside and stated pt was also confused after previous surgery. MD Louis Meckel paged to be made aware of temp. No new orders at this time. More warm blankets and heat packs applied, will continue to recheck every 30 minutes per MD and contact him again if temp does not continue to increase.

## 2019-07-31 NOTE — Discharge Instructions (Signed)
Activity:  You are encouraged to ambulate frequently (about every hour during waking hours) to help prevent blood clots from forming in your legs or lungs.  However, you should not engage in any heavy lifting (> 10-15 lbs), strenuous activity, or straining.  Diet: You should advance your diet as instructed by your physician.  It will be normal to have some bloating, nausea, and abdominal discomfort intermittently.  Prescriptions:  You will be provided a prescription for pain medication to take as needed.  If your pain is not severe enough to require the prescription pain medication, you may take extra strength Tylenol instead which will have less side effects.  You should also take a prescribed stool softener to avoid straining with bowel movements as the prescription pain medication may constipate you.  Incisions: You may remove your dressing bandages 48 hours after surgery if not removed in the hospital.  You will either have some small staples or special tissue glue at each of the incision sites. Once the bandages are removed (if present), the incisions may stay open to air.  You may start showering (but not soaking or bathing in water) the 2nd day after surgery and the incisions simply need to be patted dry after the shower.  No additional care is needed.  What to call us about: You should call the office ([phone]) if you develop fever > 101 or develop persistent vomiting. Activity:  You are encouraged to ambulate frequently (about every hour during waking hours) to help prevent blood clots from forming in your legs or lungs.  However, you should not engage in any heavy lifting (> 10-15 lbs), strenuous activity, or straining.    Foley Catheter Care A soft, flexible tube (Foley catheter) may have been placed in your bladder to drain urine and fluid. Follow these instructions: Taking Care of the Catheter Keep the area where the catheter leaves your body clean.  Attach the catheter to the leg so  there is no tension on the catheter.  Keep the drainage bag below the level of the bladder, but keep it OFF the floor.  Do not take long soaking baths. Your caregiver will give instructions about showering.  Wash your hands before touching ANYTHING related to the catheter or bag.  Using mild soap and warm water on a washcloth:  Clean the area closest to the catheter insertion site using a circular motion around the catheter.  Clean the catheter itself by wiping AWAY from the insertion site for several inches down the tube.  NEVER wipe upward as this could sweep bacteria up into the urethra (tube in your body that normally drains the bladder) and cause infection.  Place a small amount of sterile lubricant at the tip of the penis where the catheter is entering.  Taking Care of the Drainage Bags Two drainage bags may be taken home: a large overnight drainage bag, and a smaller leg bag which fits underneath clothing.  It is okay to wear the overnight bag at any time, but NEVER wear the smaller leg bag at night.  Keep the drainage bag well below the level of your bladder. This prevents backflow of urine into the bladder and allows the urine to drain freely.  Anchor the tubing to your leg to prevent pulling or tension on the catheter. Use tape or a leg strap provided by the hospital.  Empty the drainage bag when it is 1/2 to 3/4 full. Wash your hands before and after touching the bag.  Periodically   check the tubing for kinks to make sure there is no pressure on the tubing which could restrict the flow of urine.  Changing the Drainage Bags Cleanse both ends of the clean bag with alcohol before changing.  Pinch off the rubber catheter to avoid urine spillage during the disconnection.  Disconnect the dirty bag and connect the clean one.  Empty the dirty bag carefully to avoid a urine spill.  Attach the new bag to the leg with tape or a leg strap.  Cleaning the Drainage Bags Whenever a drainage bag is  disconnected, it must be cleaned quickly so it is ready for the next use.  Wash the bag in warm, soapy water.  Rinse the bag thoroughly with warm water.  Soak the bag for 30 minutes in a solution of white vinegar and water (1 cup vinegar to 1 quart warm water).  Rinse with warm water.  SEEK MEDICAL CARE IF:  You have chills or night sweats.  You are leaking around your catheter or have problems with your catheter. It is not uncommon to have sporadic leakage around your catheter as a result of bladder spasms. If the leakage stops, there is not much need for concern. If you are uncertain, call your caregiver.  You develop side effects that you think are coming from your medicines.  SEEK IMMEDIATE MEDICAL CARE IF:  You are suddenly unable to urinate. Check to see if there are any kinks in the drainage tubing that may cause this. If you cannot find any kinks, call your caregiver immediately. This is an emergency.  You develop shortness of breath or chest pains.  Bleeding persists or clots develop in your urine.  You have a fever.  You develop pain in your back or over your lower belly (abdomen).  You develop pain or swelling in your legs.  Any problems you are having get worse rather than better.  MAKE SURE YOU:  Understand these instructions.  Will watch your condition.  Will get help right away if you are not doing well or get worse.   

## 2019-07-31 NOTE — Anesthesia Postprocedure Evaluation (Signed)
Anesthesia Post Note  Patient: Andrea [name]  Procedure(s) Performed: XI ROBOTIC ASSISTED LAPAROSCOPIC SACROCOLPOPEXY WIHT SUPRACERVICAL HYSTERECTOMY (N/A ) CYSTOSCOPY MID URETHRAL  SLING (N/A )     Patient location during evaluation: PACU Anesthesia Type: General Level of consciousness: sedated Pain management: pain level controlled Vital Signs Assessment: post-procedure vital signs reviewed and stable Respiratory status: spontaneous breathing Cardiovascular status: stable Postop Assessment: no apparent nausea or vomiting Anesthetic complications: no    Last Vitals:  Vitals:   07/31/19 1400 07/31/19 1415  BP: (!) 108/59 115/61  Pulse: 69 74  Resp: 13 14  Temp:  (!) 36.4 C  SpO2: 97% 90%    Last Pain:  Vitals:   07/31/19 1415  TempSrc:   PainSc: Asleep   Pain Goal:                   Huston Foley

## 2019-08-01 LAB — BASIC METABOLIC PANEL
Anion gap: 10 (ref 5–15)
BUN: 26 mg/dL — ABNORMAL HIGH (ref 8–23)
CO2: 21 mmol/L — ABNORMAL LOW (ref 22–32)
Calcium: 8.2 mg/dL — ABNORMAL LOW (ref 8.9–10.3)
Chloride: 103 mmol/L (ref 98–111)
Creatinine, Ser: 1.66 mg/dL — ABNORMAL HIGH (ref 0.44–1.00)
GFR calc Af Amer: 35 mL/min — ABNORMAL LOW (ref >60)
GFR calc non Af Amer: 30 mL/min — ABNORMAL LOW (ref >60)
Glucose, Bld: 194 mg/dL — ABNORMAL HIGH (ref 70–99)
Potassium: 4.1 mmol/L (ref 3.5–5.1)
Sodium: 134 mmol/L — ABNORMAL LOW (ref 135–145)

## 2019-08-01 LAB — GLUCOSE, CAPILLARY
Glucose-Capillary: 184 mg/dL — ABNORMAL HIGH (ref 70–99)
Glucose-Capillary: 240 mg/dL — ABNORMAL HIGH (ref 70–99)
Glucose-Capillary: 270 mg/dL — ABNORMAL HIGH (ref 70–99)

## 2019-08-01 LAB — HEMOGLOBIN AND HEMATOCRIT, BLOOD
HCT: 28.3 % — ABNORMAL LOW (ref 36.0–46.0)
Hemoglobin: 9.4 g/dL — ABNORMAL LOW (ref 12.0–15.0)

## 2019-08-01 NOTE — TOC Progression Note (Signed)
Transition of Care [facility]) - Progression Note    Patient Details  Name: Andrea [name] MRN: [id number] Date of Birth: 1945-09-24  [facility] CM/SW Contact  Purcell Mouton, RN Phone Number: 08/01/2019, 2:03 PM  Clinical Narrative:    RW ordered from [employer] who will deliver to pt's room.         Expected Discharge Plan and Services           Expected Discharge Date: 08/01/19                                     Social Determinants of Health (SDOH) Interventions    Readmission Risk Interventions No flowsheet data found.

## 2019-08-01 NOTE — Progress Notes (Signed)
Pt discharged today per Louis Meckel MD. Pt's IV site D/C'd and site WDL. PT VSS. Pt provided with home medication lost, discharge instructions, foley care and maintenance, and prescriptions. Verbalizes understanding. Pt left floor via WC in stable condition accompained by [employer].

## 2019-08-01 NOTE — Discharge Summary (Signed)
Date of admission: 07/31/2019  Date of discharge: 08/01/2019  Admission diagnosis: pelvic organ prolapse  Discharge diagnosis: same  Secondary diagnoses:  Patient Active Problem List   Diagnosis Date Noted  . Enterocele 07/31/2019  . Coronary artery disease involving coronary bypass graft of native heart with angina pectoris ([place]) 04/30/2017  . SIRS (systemic inflammatory response syndrome) ([place]) 04/30/2017  . Symptomatic anemia 04/30/2017  . AKI (acute kidney injury) ([place]) 04/30/2017  . History of CVA in adulthood 04/30/2017  . Hypertensive heart disease 12/17/2015  . Diabetes mellitus, type II, insulin dependent ([place]) 01/04/2012  . Hypertension   . Stroke [facility])     Procedures performed: Procedure(s): XI ROBOTIC ASSISTED LAPAROSCOPIC SACROCOLPOPEXY WIHT SUPRACERVICAL HYSTERECTOMY CYSTOSCOPY MID URETHRAL  SLING  History and Physical: For full details, please see admission history and physical. Briefly, Andrea [name] is a 73 y.o. year old patient with symptomatic uterine prolapse and cystocele.   Hospital Course: Patient tolerated the procedure well.  She was then transferred to the floor after an uneventful PACU stay.  Her hospital course was uncomplicated.  On POD#1 she had met discharge criteria: was eating a regular diet, was up and ambulating independently,  pain was well controlled,  and was ready to for discharge.  She was discharged with the foley catheter and will be scheduled for a voiding trial in 1 week.  PE: NAD Vitals:   07/31/19 2143 07/31/19 2217 08/01/19 0210 08/01/19 0559  BP: (!) 118/53  (!) 124/51 (!) 117/53  Pulse: 70  67 64  Resp: '18  20 18  '$ Temp:  98.4 F (36.9 C) 98 F (36.7 C) 98.1 F (36.7 C)  TempSrc:  Oral Oral Oral  SpO2: 99%  97% 95%  Weight:      Height:        Intake/Output Summary (Last 24 hours) at 08/01/2019 0831 Last data filed at 08/01/2019 0600 Gross per 24 hour  Intake 4379.96 ml  Output 900 ml  Net 3479.96 ml   Nonlabored breathing Abdomen soft- incisions c/d/i Vaginal packing removed Ext symmetric and warm Foley draining clear urine   Laboratory values:  Recent Labs    07/29/19 1322 07/31/19 1315 08/01/19 0441  WBC 7.0  --   --   HGB 11.0* 10.8* 9.4*  HCT 33.1* 32.8* 28.3*   Recent Labs    07/29/19 1322  NA 137  K 3.9  CL 102  CO2 25  GLUCOSE 225*  BUN 24*  CREATININE 1.42*  CALCIUM 8.8*   No results for input(s): LABPT, INR in the last 72 hours. No results for input(s): LABURIN in the last 72 hours. Results for orders placed or performed during the hospital encounter of 07/29/19  Urine culture     Status: None   Collection Time: 07/29/19  1:22 PM   Specimen: Urine, Clean Catch  Result Value Ref Range Status   Specimen Description   Final    URINE, CLEAN CATCH Performed at [facility], [place] [address], [place], [place] [address]    Special Requests   Final    NONE Performed at [facility], [place] [address], [place], [place] [address]    Culture   Final    NO GROWTH Performed at [facility], [place] [address] [place], [place] [address]    Report Status 07/30/2019 FINAL  Final    Disposition: Home  Discharge instruction: The patient was instructed to be ambulatory but told to refrain from heavy lifting, strenuous  activity, or driving.   Discharge medications:  Allergies as of 08/01/2019   No Known Allergies     Medication List    TAKE these medications   aspirin EC 81 MG tablet Take 1 tablet (81 mg total) by mouth daily.   carvedilol 6.25 MG tablet Commonly known as: COREG TAKE 1 TABLET (6.25 MG TOTAL) BY MOUTH 2 (TWO) TIMES DAILY WITH A MEAL.   HumaLOG KwikPen 100 UNIT/ML KwikPen Generic drug: insulin lispro Inject 2-15 Units into the skin 3 (three) times daily before meals. (Per sliding scale)   Janumet XR 50-500 MG Tb24 Generic drug: SitaGLIPtin-MetFORMIN HCl Take 1 tablet by mouth 2 (two) times  daily.   Januvia 100 MG tablet Generic drug: sitaGLIPtin Take 100 mg by mouth daily.   Myrbetriq 25 MG Tb24 tablet Generic drug: mirabegron ER Take 25 mg by mouth daily.   NovoFine Plus 32G X 4 MM Misc Generic drug: Insulin Pen Needle UP TO FOUR TIMES DAILY AS DIRECTED   Olmesartan-amLODIPine-HCTZ 40-5-25 MG Tabs TAKE 1 TABLET BY MOUTH EVERY DAY   OneTouch Verio test strip Generic drug: glucose blood USE TO TEST BLOOD SUGAR 4 TIMES DAILY   Premarin vaginal cream Generic drug: conjugated estrogens Place 1 application vaginally daily.   sulfamethoxazole-trimethoprim 800-160 MG tablet Commonly known as: BACTRIM DS Take 1 tablet by mouth 2 (two) times daily.   traMADol 50 MG tablet Commonly known as: ULTRAM Take 1-2 tablets (50-100 mg total) by mouth every 6 (six) hours as needed for moderate pain.   Tyler Aas FlexTouch 100 UNIT/ML Sopn FlexTouch Pen Generic drug: insulin degludec Inject 24 Units into the skin every evening.   Vascepa 1 g capsule Generic drug: icosapent Ethyl TAKE 1 CAPSULE (1 G TOTAL) BY MOUTH 2 (TWO) TIMES DAILY. What changed: See the new instructions.   Vitamin D 50 MCG (2000 UT) tablet Take 2,000 Units by mouth daily.       Followup:  Follow-up Information    Ardis Hughs, MD In 1 week.   Specialty: Urology Why: Catheter removal Contact information: [place] [address] [phone]

## 2019-08-02 LAB — TYPE AND SCREEN
ABO/RH(D): O POS
Antibody Screen: POSITIVE
Unit division: 0
Unit division: 0

## 2019-08-02 LAB — BPAM RBC
Blood Product Expiration Date: 202101242359
Blood Product Expiration Date: 202101282359
Unit Type and Rh: 5100
Unit Type and Rh: 5100

## 2019-08-05 ENCOUNTER — Encounter: Payer: Self-pay | Admitting: *Deleted

## 2019-08-05 LAB — SURGICAL PATHOLOGY

## 2019-08-20 ENCOUNTER — Other Ambulatory Visit: Payer: Self-pay | Admitting: Interventional Cardiology

## 2019-10-17 DIAGNOSIS — E1165 Type 2 diabetes mellitus with hyperglycemia: Secondary | ICD-10-CM | POA: Diagnosis not present

## 2019-10-17 DIAGNOSIS — E7800 Pure hypercholesterolemia, unspecified: Secondary | ICD-10-CM | POA: Diagnosis not present

## 2019-10-22 DIAGNOSIS — E1165 Type 2 diabetes mellitus with hyperglycemia: Secondary | ICD-10-CM | POA: Diagnosis not present

## 2019-10-22 DIAGNOSIS — E7800 Pure hypercholesterolemia, unspecified: Secondary | ICD-10-CM | POA: Diagnosis not present

## 2019-10-22 DIAGNOSIS — I10 Essential (primary) hypertension: Secondary | ICD-10-CM | POA: Diagnosis not present

## 2019-10-22 DIAGNOSIS — I2510 Atherosclerotic heart disease of native coronary artery without angina pectoris: Secondary | ICD-10-CM | POA: Diagnosis not present

## 2019-10-28 DIAGNOSIS — E1151 Type 2 diabetes mellitus with diabetic peripheral angiopathy without gangrene: Secondary | ICD-10-CM | POA: Diagnosis not present

## 2019-10-28 DIAGNOSIS — L84 Corns and callosities: Secondary | ICD-10-CM | POA: Diagnosis not present

## 2019-10-28 DIAGNOSIS — M2041 Other hammer toe(s) (acquired), right foot: Secondary | ICD-10-CM | POA: Diagnosis not present

## 2019-10-28 DIAGNOSIS — B351 Tinea unguium: Secondary | ICD-10-CM | POA: Diagnosis not present

## 2019-11-14 ENCOUNTER — Other Ambulatory Visit: Payer: Self-pay | Admitting: Interventional Cardiology

## 2019-11-14 NOTE — Telephone Encounter (Signed)
Please review for refill, Thanks !  

## 2019-11-18 DIAGNOSIS — R269 Unspecified abnormalities of gait and mobility: Secondary | ICD-10-CM | POA: Diagnosis not present

## 2019-11-18 DIAGNOSIS — R5381 Other malaise: Secondary | ICD-10-CM | POA: Diagnosis not present

## 2019-11-19 DIAGNOSIS — N8111 Cystocele, midline: Secondary | ICD-10-CM | POA: Diagnosis not present

## 2019-11-19 DIAGNOSIS — N3281 Overactive bladder: Secondary | ICD-10-CM | POA: Diagnosis not present

## 2019-11-22 ENCOUNTER — Telehealth: Payer: Self-pay | Admitting: Interventional Cardiology

## 2019-11-22 DIAGNOSIS — I5032 Chronic diastolic (congestive) heart failure: Secondary | ICD-10-CM

## 2019-11-22 DIAGNOSIS — I10 Essential (primary) hypertension: Secondary | ICD-10-CM

## 2019-11-22 NOTE — Telephone Encounter (Signed)
New message   Pt c/o swelling: STAT is pt has developed SOB within 24 hours  1) How much weight have you gained and in what time span?patient has gained 6 lbs since June of last year   2) If swelling, where is the swelling located? Swelling in both ankles and feet   3) Are you currently taking a fluid pill? No   4) Are you currently SOB? No   5) Do you have a log of your daily weights (if so, list)? No   6) Have you gained 3 pounds in a day or 5 pounds in a week?no   7) Have you traveled recently? No   Patient states that she has a heaviness on chest at top part of her breast. Please call.

## 2019-11-22 NOTE — Telephone Encounter (Signed)
Patient calling in stating that she has been SOB at rest which becomes worse on exertion.   When she is laying down, the upper part of her chest around her sternum hurts. This has been going on for a couple of months.  She describes the pain as originally only on the left side but now it typically hurts on both sides of her chest.  She denies any CP when sitting or standing. The pain only occurs when the patient is lying down. She reports that she has a high pain tolerance but rates this pain as 2/10.   Pt took her BP while on the phone which was 137/68 HR 65  She does have swelling in her feet and ankles. She denies eating additional salt. She thinks that she has put on weight. She does not have a recent weight other than today which she weighed in at 168 pounds. Most recent weight in the chart is 164 lb. She thinks that she has some fluid build up and is concerned.   Advised the patient that I would let Dr. Katrinka Blazing and his RN know. She is aware of when to call EMS if her symptoms change.

## 2019-11-24 NOTE — Telephone Encounter (Signed)
She needs BNP and BMET along with OV this week with ECG.

## 2019-11-25 ENCOUNTER — Other Ambulatory Visit: Payer: Medicare PPO | Admitting: *Deleted

## 2019-11-25 ENCOUNTER — Other Ambulatory Visit: Payer: Self-pay

## 2019-11-25 DIAGNOSIS — I10 Essential (primary) hypertension: Secondary | ICD-10-CM | POA: Diagnosis not present

## 2019-11-25 DIAGNOSIS — I5032 Chronic diastolic (congestive) heart failure: Secondary | ICD-10-CM | POA: Diagnosis not present

## 2019-11-25 NOTE — Telephone Encounter (Signed)
Patient called back to schedule lab appointment for today. Orders are in epic.

## 2019-11-25 NOTE — Telephone Encounter (Signed)
Called patient to determine how she is feeling today. She states she continues to have "soreness" in the area of her sternum. She denies weight gain; reports weight was stable this weekend at 167.9 lb. When asked about SOB, she states "not really."  States her symptoms do not feel like prior MI but has been going on since last Monday and she thinks she needs to be seen. I scheduled her to see Andrea Reedy, PA on Wednesday 4/21. I asked if she can get her lab work prior to appointment and she states she will call back to let us know if she can come in today or tomorrow for labs; states she is sharing a car with her daughter. I asked her to call back and ask for the triage nurses and she thanked me for the call.

## 2019-11-26 LAB — BASIC METABOLIC PANEL
BUN/Creatinine Ratio: 21 (ref 12–28)
BUN: 25 mg/dL (ref 8–27)
CO2: 25 mmol/L (ref 20–29)
Calcium: 9.2 mg/dL (ref 8.7–10.3)
Chloride: 100 mmol/L (ref 96–106)
Creatinine, Ser: 1.21 mg/dL — ABNORMAL HIGH (ref 0.57–1.00)
GFR calc Af Amer: 51 mL/min/{1.73_m2} — ABNORMAL LOW (ref >59)
GFR calc non Af Amer: 44 mL/min/{1.73_m2} — ABNORMAL LOW (ref >59)
Glucose: 283 mg/dL — ABNORMAL HIGH (ref 65–99)
Potassium: 4.3 mmol/L (ref 3.5–5.2)
Sodium: 137 mmol/L (ref 134–144)

## 2019-11-26 LAB — PRO B NATRIURETIC PEPTIDE: NT-Pro BNP: 193 pg/mL (ref 0–301)

## 2019-11-26 NOTE — Progress Notes (Deleted)
Cardiology Office Note    Date:  11/26/2019   ID:  Andrea, [name] 11/20/1945, MRN [id number]  PCP:  Renford Dills, MD  Cardiologist: Lesleigh Noe, MD EPS: None  No chief complaint on file.   History of Present Illness:  Andrea [name] is a 74 y.o. female with history of CAD status post NSTEMI followed by coronary bypass grafting 04/2017 (LIMA to LAD, SVG to diagonal, SVG to OM) September 2018, diabetes mellitus type 2, hypertension, and prior history of CVA. Acute on chronic right cerebral white matter infarct-with advanced small vessel disease. Left facial droop, 2013) neurogenic right arm discomfort post bypass surgery.   Patient last saw Dr. Katrinka Blazing 06/2019 and was doing well. Patient called in complaining of some chest pain BNP 11/25/2019 was normal at 193 creatinine 1.21 improved from 07/2019 glucose 283  Past Medical History:  Diagnosis Date  . Anemia 04/30/2017  . CHF (congestive heart failure) (HCC)   . Diabetes mellitus   . Hypertension   . Hypokalemia 04/2017  . Macular degeneration disease    loss of central vision on left and decreased peripheral vision.   . Stroke (HCC) 12/2011   no residuals noted    Past Surgical History:  Procedure Laterality Date  . CHOLECYSTECTOMY    . CORONARY ARTERY BYPASS GRAFT N/A 04/18/2017   Procedure: CORONARY ARTERY BYPASS GRAFTING (CABG) times four using left internal mammary artery and right leg saphenous vein;  Surgeon: Kerin Perna, MD;  Location: [facility];  Service: Open Heart Surgery;  Laterality: N/A;  . EYE SURGERY     right  . LEFT HEART CATH AND CORONARY ANGIOGRAPHY N/A 04/17/2017   Procedure: LEFT HEART CATH AND CORONARY ANGIOGRAPHY;  Surgeon: Marykay Lex, MD;  Location: [facility] INVASIVE CV LAB;  Service: Cardiovascular;  Laterality: N/A;  . PUBOVAGINAL SLING N/A 07/31/2019   Procedure: CYSTOSCOPY MID URETHRAL  SLING;  Surgeon: Crist Fat, MD;  Location: [facility] ORS;  Service: Urology;  Laterality: N/A;  .  REFRACTIVE SURGERY Left   . ROBOTIC ASSISTED LAPAROSCOPIC SACROCOLPOPEXY N/A 07/31/2019   Procedure: XI ROBOTIC ASSISTED LAPAROSCOPIC SACROCOLPOPEXY WIHT SUPRACERVICAL HYSTERECTOMY;  Surgeon: Crist Fat, MD;  Location: [facility] ORS;  Service: Urology;  Laterality: N/A;  . ROTATOR CUFF REPAIR     left  . TEE WITHOUT CARDIOVERSION N/A 04/18/2017   Procedure: TRANSESOPHAGEAL ECHOCARDIOGRAM (TEE);  Surgeon: Donata Clay, Theron Arista, MD;  Location: [facility] OR;  Service: Open Heart Surgery;  Laterality: N/A;    Current Medications: No outpatient medications have been marked as taking for the 11/27/19 encounter (Appointment) with Dyann Kief, PA-C.     Allergies:   Patient has no known allergies.   Social History   Socioeconomic History  . Marital status: Married    Spouse name: Not on file  . Number of children: 3  . Years of education: 35  . Highest education level: Not on file  Occupational History  . Occupation: Professor    Associate Professor: [employer]  Tobacco Use  . Smoking status: Former Smoker    Packs/day: 0.50    Years: 20.00    Pack years: 10.00    Types: Cigarettes    Quit date: 01/02/1997    Years since quitting: 22.9  . Smokeless tobacco: Never Used  Substance and Sexual Activity  . Alcohol use: No  . Drug use: No  . Sexual activity: Not on file  Other Topics Concern  . Not on file  Social  History Narrative   Lives with husband and 3 children in a 2 story home.  Retired professor at [employer].  Education: college.    Social Determinants of Health   Financial Resource Strain:   . Difficulty of Paying Living Expenses:   Food Insecurity:   . Worried About Charity fundraiser in the Last Year:   . Arboriculturist in the Last Year:   Transportation Needs:   . Film/video editor (Medical):   Marland Kitchen Lack of Transportation (Non-Medical):   Physical Activity:   . Days of Exercise per Week:   . Minutes of Exercise per Session:   Stress:   . Feeling of Stress :   Social  Connections:   . Frequency of Communication with Friends and Family:   . Frequency of Social Gatherings with Friends and Family:   . Attends Religious Services:   . Active Member of Clubs or Organizations:   . Attends Archivist Meetings:   Marland Kitchen Marital Status:      Family History:  The patient's ***family history includes Healthy in her brother; Heart attack in her brother and father; Hypertension in her brother, father, and mother; Stroke in her mother.   ROS:   Please see the history of present illness.    ROS All other systems reviewed and are negative.   PHYSICAL EXAM:   VS:  There were no vitals taken for this visit.  Physical Exam  GEN: Well nourished, well developed, in no acute distress  HEENT: normal  Neck: no JVD, carotid bruits, or masses Cardiac:RRR; no murmurs, rubs, or gallops  Respiratory:  clear to auscultation bilaterally, normal work of breathing GI: soft, nontender, nondistended, + BS Ext: without cyanosis, clubbing, or edema, Good distal pulses bilaterally MS: no deformity or atrophy  Skin: warm and dry, no rash Neuro:  Alert and Oriented x 3, Strength and sensation are intact Psych: euthymic mood, full affect  Wt Readings from Last 3 Encounters:  07/31/19 164 lb (74.4 kg)  07/29/19 164 lb (74.4 kg)  07/26/19 161 lb (73 kg)      Studies/Labs Reviewed:   EKG:  EKG is*** ordered today.  The ekg ordered today demonstrates ***  Recent Labs: 07/29/2019: ALT 15; Platelets 245 08/01/2019: Hemoglobin 9.4 11/25/2019: BUN 25; Creatinine, Ser 1.21; NT-Pro BNP 193; Potassium 4.3; Sodium 137   Lipid Panel    Component Value Date/Time   CHOL 143 08/24/2018 1041   TRIG 108 08/24/2018 1041   HDL 58 08/24/2018 1041   CHOLHDL 2.5 08/24/2018 1041   CHOLHDL 3.3 04/17/2017 0014   VLDL 41 (H) 04/17/2017 0014   LDLCALC 63 08/24/2018 1041    Additional studies/ records that were reviewed today include:  ***    ASSESSMENT:    1. Coronary artery  disease involving coronary bypass graft of native heart without angina pectoris   2. Chronic diastolic CHF (congestive heart failure) ([place])   3. Essential hypertension   4. Familial hypercholesterolemia   5. Diabetes mellitus, type II, insulin dependent ([place])   6. History of CVA in adulthood      PLAN:  In order of problems listed above: CAD status post NSTEMI followed by CABG 9323  Chronic diastolic CHF  Essential hypertension  Familial hypercholesterolemia  DM type 2 insulin-dependent  History of CVA    Medication Adjustments/Labs and Tests Ordered: Current medicines are reviewed at length with the patient today.  Concerns regarding medicines are outlined above.  Medication changes, Labs and  Tests ordered today are listed in the Patient Instructions below. There are no Patient Instructions on file for this visit.   Elson Clan, PA-C  11/26/2019 3:28 PM    [facility] [address] [place], [place], [place]  [address] Phone: [phone]; Fax: [phone]

## 2019-11-27 ENCOUNTER — Ambulatory Visit: Payer: Medicare Other | Admitting: Physician Assistant

## 2019-11-27 DIAGNOSIS — E119 Type 2 diabetes mellitus without complications: Secondary | ICD-10-CM

## 2019-11-27 DIAGNOSIS — Z8673 Personal history of transient ischemic attack (TIA), and cerebral infarction without residual deficits: Secondary | ICD-10-CM

## 2019-11-27 DIAGNOSIS — I10 Essential (primary) hypertension: Secondary | ICD-10-CM

## 2019-11-27 DIAGNOSIS — I5032 Chronic diastolic (congestive) heart failure: Secondary | ICD-10-CM

## 2019-11-27 DIAGNOSIS — E7801 Familial hypercholesterolemia: Secondary | ICD-10-CM

## 2019-11-27 DIAGNOSIS — I25810 Atherosclerosis of coronary artery bypass graft(s) without angina pectoris: Secondary | ICD-10-CM

## 2019-12-02 ENCOUNTER — Other Ambulatory Visit: Payer: Self-pay

## 2019-12-02 ENCOUNTER — Encounter: Payer: Self-pay | Admitting: Physical Therapy

## 2019-12-02 ENCOUNTER — Ambulatory Visit: Payer: Medicare PPO | Attending: Internal Medicine | Admitting: Physical Therapy

## 2019-12-02 DIAGNOSIS — R2689 Other abnormalities of gait and mobility: Secondary | ICD-10-CM

## 2019-12-02 DIAGNOSIS — M6281 Muscle weakness (generalized): Secondary | ICD-10-CM | POA: Diagnosis not present

## 2019-12-02 NOTE — Therapy (Signed)
[facility] [address] [place], [place], [address] Phone: [phone]   Fax:  [phone]  Physical Therapy Evaluation  Patient Details  Name: KALAH PFLUM MRN: [id number] Date of Birth: [birth year] Referring Provider (PT): Dr Renford Dills    Encounter Date: 12/02/2019  PT End of Session - 12/02/19 1118    Visit Number  1    Number of Visits  12    Date for PT Re-Evaluation  01/13/20    Authorization Type  [employer]    PT Start Time  1055    PT Stop Time  1140    PT Time Calculation (min)  45 min    Activity Tolerance  Patient tolerated treatment well    Behavior During Therapy  [facility] for tasks assessed/performed       Past Medical History:  Diagnosis Date  . Anemia 04/30/2017  . CHF (congestive heart failure) (HCC)   . Diabetes mellitus   . Hypertension   . Hypokalemia 04/2017  . Macular degeneration disease    loss of central vision on left and decreased peripheral vision.   . Stroke (HCC) 12/2011   no residuals noted    Past Surgical History:  Procedure Laterality Date  . CHOLECYSTECTOMY    . CORONARY ARTERY BYPASS GRAFT N/A 04/18/2017   Procedure: CORONARY ARTERY BYPASS GRAFTING (CABG) times four using left internal mammary artery and right leg saphenous vein;  Surgeon: Kerin Perna, MD;  Location: [facility] OR;  Service: Open Heart Surgery;  Laterality: N/A;  . EYE SURGERY     right  . LEFT HEART CATH AND CORONARY ANGIOGRAPHY N/A 04/17/2017   Procedure: LEFT HEART CATH AND CORONARY ANGIOGRAPHY;  Surgeon: Marykay Lex, MD;  Location: [facility] INVASIVE CV LAB;  Service: Cardiovascular;  Laterality: N/A;  . PUBOVAGINAL SLING N/A 07/31/2019   Procedure: CYSTOSCOPY MID URETHRAL  SLING;  Surgeon: Crist Fat, MD;  Location: [facility] ORS;  Service: Urology;  Laterality: N/A;  . REFRACTIVE SURGERY Left   . ROBOTIC ASSISTED LAPAROSCOPIC SACROCOLPOPEXY N/A 07/31/2019   Procedure: XI ROBOTIC ASSISTED LAPAROSCOPIC  SACROCOLPOPEXY WIHT SUPRACERVICAL HYSTERECTOMY;  Surgeon: Crist Fat, MD;  Location: [facility] ORS;  Service: Urology;  Laterality: N/A;  . ROTATOR CUFF REPAIR     left  . TEE WITHOUT CARDIOVERSION N/A 04/18/2017   Procedure: TRANSESOPHAGEAL ECHOCARDIOGRAM (TEE);  Surgeon: Donata Clay, Theron Arista, MD;  Location: [facility] OR;  Service: Open Heart Surgery;  Laterality: N/A;    There were no vitals filed for this visit.   Subjective Assessment - 12/02/19 1059    Subjective  Patient a history of deconditioning stemming from a heart problem in 2018. Since that time she has felt like her gait and conditioning. She had been going to the gym prior to the pandemic but has been unable to do so. She had a surgery in Decemeber that has further exacerbated her deconditioning and fatigue with daily tasks.    Pertinent History  CHF, macualr degeneration, Stroke earlier stroke effected left foot, CABG 2018, pubovaginal sling, Left RTC repair;    Diagnostic tests  Cervical MRI: multi level degeneration    Currently in Pain?  Yes    Pain Score  3     Pain Location  Back    Pain Orientation  Left;Mid    Pain Descriptors / Indicators  Aching    Pain Type  Chronic pain    Pain Radiating Towards  patient has burning pain in both feet thatisnot likley  releated to the back    Pain Onset  More than a month ago    Pain Frequency  Intermittent    Aggravating Factors   sitting stiffens the back    Pain Relieving Factors  standing up for a few minutes but not too long    Multiple Pain Sites  No         [facility] PT Assessment - 12/02/19 0001      Assessment   Medical Diagnosis  General deconditioning     Referring Provider (PT)  Dr Renford Dills     Onset Date/Surgical Date  --   symptoms increased after last surgery    Hand Dominance  Right    Next MD Visit  July 25th     Prior Therapy  for right shoulder but still has deficits       Precautions   Precautions  None      Restrictions   Weight Bearing Restrictions   No      Balance Screen   Has the patient fallen in the past 6 months  Yes    How many times?  1    Has the patient had a decrease in activity level because of a fear of falling?   No    Is the patient reluctant to leave their home because of a fear of falling?   No      Home Environment   Additional Comments  has stairs at her house. has no problem with them. Uses a rail       Prior Function   Level of Independence  Independent    Vocation  Retired    Leisure  would like to go back to the gym       Cognition   Overall Cognitive Status  Within Functional Limits for tasks assessed    Attention  Focused    Focused Attention  Appears intact    Memory  Appears intact    Awareness  Appears intact    Problem Solving  Appears intact      Observation/Other Assessments   Focus on Therapeutic Outcomes (FOTO)   54% limitation 48% expected limitation       Sensation   Light Touch  Appears Intact    Additional Comments  numbness and burning in both feet worse in the morning       Coordination   Gross Motor Movements are Fluid and Coordinated  Yes    Fine Motor Movements are Fluid and Coordinated  Yes      Posture/Postural Control   Posture Comments  rounded shoulders and forward head       ROM / Strength   AROM / PROM / Strength  AROM;PROM;Strength      Strength   Strength Assessment Site  Hip;Knee;Hand    Right/Left hand  Right;Left    Right Hand Grip (lbs)  15    Left Hand Grip (lbs)  10    Right/Left Hip  Right;Left    Right Hip Flexion  3+/5    Right Hip ABduction  4/5    Left Hip Flexion  3/5    Left Hip ABduction  3/5    Right/Left Knee  Right;Left    Right Knee Flexion  5/5    Right Knee Extension  5/5    Left Knee Flexion  4+/5    Left Knee Extension  4/5      Transfers   Comments  Sit to stand 5x: 28 seconds: when  patient sits for a long period of time she stands very slowly from sit to stand and hads to stand for 3-5 sec before she can walk.        Ambulation/Gait   Gait Comments  decreased left single leg stance and hip fleixon; lateral movement; 6 min walk test : 340 with seated rest break at 2:45 for 27 seconds. Fatigue in hips noted      High Level Balance   High Level Balance Comments  narrow base of support: independent 1/2 tandem SBA; full tandem: unable to maintain bilateral                   Objective measurements completed on examination: See above findings.      [facility] Adult PT Treatment/Exercise - 12/02/19 0001      Knee/Hip Exercises: Seated   Long Arc Quad Limitations  in low range 90-45 x10 bilateral     Other Seated Knee/Hip Exercises  seated heel raise x15 bilateral     Abd/Adduction Limitations  sewated clamshell x10 with band                PT Short Term Goals - 12/02/19 1215      PT SHORT TERM GOAL #1   Title  Patient will decrease sit to stand testtime by 8 seconds    Time  3    Period  Weeks    Status  New    Target Date  12/23/19      PT SHORT TERM GOAL #2   Title  Patient will increase 6 minute walk test distance by 150'    Time  3    Period  Weeks    Status  New    Target Date  12/23/19      PT SHORT TERM GOAL #4   Title  Patient will increase right grip strength to 20 lbs right 15 lbs left    Baseline  right 15 left 10    Time  3    Period  Weeks    Status  New    Target Date  12/23/19        PT Long Term Goals - 12/02/19 1217      PT LONG TERM GOAL #1   Title  Patient will improve FOTO score to 48% limitation in order to demonstrate improved function    Time  6    Period  Weeks    Status  New    Target Date  01/13/20      PT LONG TERM GOAL #2   Title  Patient will transfer sit to stand without having to pause in order to improve transfer safety    Time  6    Period  Weeks    Status  New    Target Date  01/13/20      PT LONG TERM GOAL #3   Title  Patient will ambualte 1000' with LRAD with minimal rest breaks in 6 min    Baseline  360    Time  6     Period  Weeks    Status  New    Target Date  01/13/20             Plan - 12/02/19 1119    Clinical Impression Statement  Patient is a 74 year old female who presents with deconditioning and general fatigue which has increased since a surgery in Decemeber. She had a fall since that point and had difficulty  getting up from the floor. The patient had a limited SLR test and took increased time on 5x sit to stand test. As she fatigued her left leg became more antalgic with gait. Sh emay benefit from a cane but we will address that next visit.    Personal Factors and Comorbidities  Fitness;Comorbidity 1;Comorbidity 2;Comorbidity 3+    Comorbidities  CABG 2018; pelvic sling durgery 2020, DMII with likely neuropathy, CVA effecting left leg    Examination-Activity Limitations  Sit;Stand;Stairs;Squat;Carry    Examination-Participation Restrictions  Laundry;Shop;Cleaning;Community Activity    Stability/Clinical Decision Making  Evolving/Moderate complexity   decreaed mobility very time   Clinical Decision Making  Moderate    Rehab Potential  Excellent    PT Frequency  2x / week    PT Duration  6 weeks    PT Treatment/Interventions  Cryotherapy;Electrical Stimulation;Iontophoresis 4mg /ml Dexamethasone;ADLs/Self Care Home Management;Moist Heat;Ultrasound;Gait training;Stair training;DME Instruction;Functional mobility training;Therapeutic activities;Therapeutic exercise;Neuromuscular re-education;Patient/family education;Manual techniques;Passive range of motion    PT Next Visit Plan  begin progressing general strengthening progranm. Patient has poor posture so consider UE psoterior chain strengthening. the patient wants to get back on the nu-step. Her overall goal is to get back in the gym. progress to standing exercises as able. Continue to work on [employer].    PT Home Exercise Plan  low range laq. hip abduction yellow band; heel raises seated    Consulted and Agree with Plan of Care   Patient       Patient will benefit from skilled therapeutic intervention in order to improve the following deficits and impairments:  Abnormal gait, Decreased endurance, Difficulty walking, Increased muscle spasms, Decreased range of motion, Decreased activity tolerance, Decreased strength, Pain  Visit Diagnosis: Other abnormalities of gait and mobility  Muscle weakness (generalized)     Problem List Patient Active Problem List   Diagnosis Date Noted  . Enterocele 07/31/2019  . Coronary artery disease involving coronary bypass graft of native heart with angina pectoris ([place]) 04/30/2017  . SIRS (systemic inflammatory response syndrome) ([place]) 04/30/2017  . Symptomatic anemia 04/30/2017  . AKI (acute kidney injury) ([place]) 04/30/2017  . History of CVA in adulthood 04/30/2017  . Hypertensive heart disease 12/17/2015  . Diabetes mellitus, type II, insulin dependent ([employer]) 01/04/2012  . Hypertension   . Stroke [facility])     Carney Living PT DPT  12/02/2019, 3:37 PM  [facility] [address] [place], [place], [address] Phone: [phone]   Fax:  [phone]  Name: LAILAH MARCELLI MRN: [id number] Date of Birth: [birth year]

## 2019-12-23 DIAGNOSIS — B351 Tinea unguium: Secondary | ICD-10-CM | POA: Diagnosis not present

## 2019-12-23 DIAGNOSIS — E1151 Type 2 diabetes mellitus with diabetic peripheral angiopathy without gangrene: Secondary | ICD-10-CM | POA: Diagnosis not present

## 2019-12-23 DIAGNOSIS — L84 Corns and callosities: Secondary | ICD-10-CM | POA: Diagnosis not present

## 2019-12-23 DIAGNOSIS — M2041 Other hammer toe(s) (acquired), right foot: Secondary | ICD-10-CM | POA: Diagnosis not present

## 2020-02-12 DIAGNOSIS — E119 Type 2 diabetes mellitus without complications: Secondary | ICD-10-CM | POA: Diagnosis not present

## 2020-02-12 DIAGNOSIS — I25810 Atherosclerosis of coronary artery bypass graft(s) without angina pectoris: Secondary | ICD-10-CM | POA: Diagnosis not present

## 2020-02-12 DIAGNOSIS — Z1389 Encounter for screening for other disorder: Secondary | ICD-10-CM | POA: Diagnosis not present

## 2020-02-12 DIAGNOSIS — E663 Overweight: Secondary | ICD-10-CM | POA: Diagnosis not present

## 2020-02-12 DIAGNOSIS — I10 Essential (primary) hypertension: Secondary | ICD-10-CM | POA: Diagnosis not present

## 2020-02-12 DIAGNOSIS — Z794 Long term (current) use of insulin: Secondary | ICD-10-CM | POA: Diagnosis not present

## 2020-02-12 DIAGNOSIS — I739 Peripheral vascular disease, unspecified: Secondary | ICD-10-CM | POA: Diagnosis not present

## 2020-02-12 DIAGNOSIS — Z23 Encounter for immunization: Secondary | ICD-10-CM | POA: Diagnosis not present

## 2020-02-12 DIAGNOSIS — E113299 Type 2 diabetes mellitus with mild nonproliferative diabetic retinopathy without macular edema, unspecified eye: Secondary | ICD-10-CM | POA: Diagnosis not present

## 2020-02-12 DIAGNOSIS — Z0000 Encounter for general adult medical examination without abnormal findings: Secondary | ICD-10-CM | POA: Diagnosis not present

## 2020-02-12 DIAGNOSIS — E7800 Pure hypercholesterolemia, unspecified: Secondary | ICD-10-CM | POA: Diagnosis not present

## 2020-02-13 ENCOUNTER — Other Ambulatory Visit: Payer: Self-pay | Admitting: Internal Medicine

## 2020-02-13 DIAGNOSIS — I739 Peripheral vascular disease, unspecified: Secondary | ICD-10-CM

## 2020-02-17 ENCOUNTER — Ambulatory Visit
Admission: RE | Admit: 2020-02-17 | Discharge: 2020-02-17 | Disposition: A | Discharge status: Home / Self Care | Payer: Medicare PPO | Source: Ambulatory Visit | Attending: Internal Medicine | Admitting: Internal Medicine

## 2020-02-17 DIAGNOSIS — M25572 Pain in left ankle and joints of left foot: Secondary | ICD-10-CM | POA: Diagnosis not present

## 2020-02-17 DIAGNOSIS — I739 Peripheral vascular disease, unspecified: Secondary | ICD-10-CM

## 2020-02-17 DIAGNOSIS — M25571 Pain in right ankle and joints of right foot: Secondary | ICD-10-CM | POA: Diagnosis not present

## 2020-02-21 DIAGNOSIS — E1165 Type 2 diabetes mellitus with hyperglycemia: Secondary | ICD-10-CM | POA: Diagnosis not present

## 2020-02-21 DIAGNOSIS — E010 Iodine-deficiency related diffuse (endemic) goiter: Secondary | ICD-10-CM | POA: Diagnosis not present

## 2020-02-21 DIAGNOSIS — I2510 Atherosclerotic heart disease of native coronary artery without angina pectoris: Secondary | ICD-10-CM | POA: Diagnosis not present

## 2020-02-21 DIAGNOSIS — E7800 Pure hypercholesterolemia, unspecified: Secondary | ICD-10-CM | POA: Diagnosis not present

## 2020-02-21 DIAGNOSIS — I10 Essential (primary) hypertension: Secondary | ICD-10-CM | POA: Diagnosis not present

## 2020-02-25 DIAGNOSIS — E1165 Type 2 diabetes mellitus with hyperglycemia: Secondary | ICD-10-CM | POA: Diagnosis not present

## 2020-02-25 DIAGNOSIS — I2510 Atherosclerotic heart disease of native coronary artery without angina pectoris: Secondary | ICD-10-CM | POA: Diagnosis not present

## 2020-02-25 DIAGNOSIS — E7800 Pure hypercholesterolemia, unspecified: Secondary | ICD-10-CM | POA: Diagnosis not present

## 2020-02-25 DIAGNOSIS — E010 Iodine-deficiency related diffuse (endemic) goiter: Secondary | ICD-10-CM | POA: Diagnosis not present

## 2020-02-25 DIAGNOSIS — I10 Essential (primary) hypertension: Secondary | ICD-10-CM | POA: Diagnosis not present

## 2020-03-09 DIAGNOSIS — M2041 Other hammer toe(s) (acquired), right foot: Secondary | ICD-10-CM | POA: Diagnosis not present

## 2020-03-09 DIAGNOSIS — B351 Tinea unguium: Secondary | ICD-10-CM | POA: Diagnosis not present

## 2020-03-09 DIAGNOSIS — L84 Corns and callosities: Secondary | ICD-10-CM | POA: Diagnosis not present

## 2020-03-09 DIAGNOSIS — E1151 Type 2 diabetes mellitus with diabetic peripheral angiopathy without gangrene: Secondary | ICD-10-CM | POA: Diagnosis not present

## 2020-04-05 NOTE — Progress Notes (Signed)
Cardiology Office Note:    Date:  04/08/2020   ID:  Andrea [name], DOB [birth year], MRN [id number]  PCP:  Renford Dills, MD  Cardiologist:  Lesleigh Noe, MD   Referring MD: Renford Dills, MD   Chief Complaint  Patient presents with  . Coronary Artery Disease  . Hypertension  . Hyperlipidemia    History of Present Illness:    Andrea [name] is a 74 y.o. female with a hx of coronary bypass grafting 04/2017 (LIMA to LAD, SVG to diagonal, SVG to OM) September 2018 after presenting with non-ST elevation myocardial infarction, diabetes mellitus type 2, hypertension, and prior history of CVA. Acute on chronic right cerebral white matter infarct-with advanced small vessel disease. Left facial droop, 2013)neurogenic right arm discomfort post bypass surgery.  She is doing well.  She is not having angina.  She is not walking regularly but states that she is up moving about in her house frequently during the day.  No dizziness, lightheadedness, syncope, or neurological complaints.  There are no medication side effects.  Past Medical History:  Diagnosis Date  . Anemia 04/30/2017  . CHF (congestive heart failure) (HCC)   . Diabetes mellitus   . Hypertension   . Hypokalemia 04/2017  . Macular degeneration disease    loss of central vision on left and decreased peripheral vision.   . Stroke (HCC) 12/2011   no residuals noted    Past Surgical History:  Procedure Laterality Date  . CHOLECYSTECTOMY    . CORONARY ARTERY BYPASS GRAFT N/A 04/18/2017   Procedure: CORONARY ARTERY BYPASS GRAFTING (CABG) times four using left internal mammary artery and right leg saphenous vein;  Surgeon: Kerin Perna, MD;  Location[facility] OR;  Service: Open Heart Surgery;  Laterality: N/A;  . EYE SURGERY     right  . LEFT HEART CATH AND CORONARY ANGIOGRAPHY N/A 04/17/2017   Procedure: LEFT HEART CATH AND CORONARY ANGIOGRAPHY;  Surgeon: Marykay Lex, MD;  Location: [facility] INVASIVE CV LAB;  Service:  Cardiovascular;  Laterality: N/A;  . PUBOVAGINAL SLING N/A 07/31/2019   Procedure: CYSTOSCOPY MID URETHRAL  SLING;  Surgeon: Crist Fat, MD;  Location: [facility] ORS;  Service: Urology;  Laterality: N/A;  . REFRACTIVE SURGERY Left   . ROBOTIC ASSISTED LAPAROSCOPIC SACROCOLPOPEXY N/A 07/31/2019   Procedure: XI ROBOTIC ASSISTED LAPAROSCOPIC SACROCOLPOPEXY WIHT SUPRACERVICAL HYSTERECTOMY;  Surgeon: Crist Fat, MD;  Location: [facility] ORS;  Service: Urology;  Laterality: N/A;  . ROTATOR CUFF REPAIR     left  . TEE WITHOUT CARDIOVERSION N/A 04/18/2017   Procedure: TRANSESOPHAGEAL ECHOCARDIOGRAM (TEE);  Surgeon: Donata Clay, Theron Arista, MD;  Location: [facility] OR;  Service: Open Heart Surgery;  Laterality: N/A;    Current Medications: Current Meds  Medication Sig  . ALPRAZolam (XANAX) 0.25 MG tablet Take 1 tablet by mouth as needed.  Marland Kitchen aspirin EC 81 MG tablet Take 1 tablet (81 mg total) by mouth daily.  . carvedilol (COREG) 6.25 MG tablet TAKE 1 TABLET BY MOUTH TWICE A DAY WITH MEALS  . Cholecalciferol (VITAMIN D) 2000 UNITS tablet Take 2,000 Units by mouth daily.  . diphenhydramine-acetaminophen (TYLENOL PM) 25-500 MG TABS tablet Take 1 tablet by mouth at bedtime as needed.  Marland Kitchen HUMALOG KWIKPEN 100 UNIT/ML KiwkPen Inject 2-15 Units into the skin 3 (three) times daily before meals. (Per sliding scale)  . JANUMET XR 50-500 MG TB24 Take 1 tablet by mouth 2 (two) times daily.  Marland Kitchen MYRBETRIQ 25 MG TB24 tablet Take 25  mg by mouth daily.  Marland Kitchen NOVOFINE PLUS 32G X 4 MM MISC UP TO FOUR TIMES DAILY AS DIRECTED  . Olmesartan-amLODIPine-HCTZ 40-5-25 MG TABS TAKE 1 TABLET BY MOUTH EVERY DAY  . ONETOUCH VERIO test strip USE TO TEST BLOOD SUGAR 4 TIMES DAILY  . TRESIBA FLEXTOUCH 100 UNIT/ML SOPN FlexTouch Pen Inject 24 Units into the skin every evening.   Marland Kitchen VASCEPA 1 g capsule TAKE 1 CAPSULE (1 G TOTAL) BY MOUTH 2 (TWO) TIMES DAILY.  . [DISCONTINUED] sulfamethoxazole-trimethoprim (BACTRIM DS) 800-160 MG tablet Take 1 tablet  by mouth 2 (two) times daily.  . [DISCONTINUED] traMADol (ULTRAM) 50 MG tablet Take 1-2 tablets (50-100 mg total) by mouth every 6 (six) hours as needed for moderate pain.  . [DISCONTINUED] vitamin E 180 MG (400 UNITS) capsule Take 1 capsule by mouth daily.     Allergies:   Patient has no known allergies.   Social History   Socioeconomic History  . Marital status: Married    Spouse name: Not on file  . Number of children: 3  . Years of education: 15  . Highest education level: Not on file  Occupational History  . Occupation: Professor    Associate Professor: [employer]  Tobacco Use  . Smoking status: Former Smoker    Packs/day: 0.50    Years: 20.00    Pack years: 10.00    Types: Cigarettes    Quit date: 01/02/1997    Years since quitting: 23.2  . Smokeless tobacco: Never Used  Vaping Use  . Vaping Use: Never used  Substance and Sexual Activity  . Alcohol use: No  . Drug use: No  . Sexual activity: Not on file  Other Topics Concern  . Not on file  Social History Narrative   Lives with husband and 3 children in a 2 story home.  Retired professor at [employer].  Education: college.    Social Determinants of Health   Financial Resource Strain:   . Difficulty of Paying Living Expenses: Not on file  Food Insecurity:   . Worried About Programme researcher, broadcasting/film/video in the Last Year: Not on file  . Ran Out of Food in the Last Year: Not on file  Transportation Needs:   . Lack of Transportation (Medical): Not on file  . Lack of Transportation (Non-Medical): Not on file  Physical Activity:   . Days of Exercise per Week: Not on file  . Minutes of Exercise per Session: Not on file  Stress:   . Feeling of Stress : Not on file  Social Connections:   . Frequency of Communication with Friends and Family: Not on file  . Frequency of Social Gatherings with Friends and Family: Not on file  . Attends Religious Services: Not on file  . Active Member of Clubs or Organizations: Not on file  . Attends Tax inspector Meetings: Not on file  . Marital Status: Not on file     Family History: The patient's family history includes Healthy in her brother; Heart attack in her brother and father; Hypertension in her brother, father, and mother; Stroke in her mother.  ROS:   Please see the history of present illness.    Still has limited use of her right arm.  May have a frozen right shoulder.  She urinary bladder surgery for incontinence that has worked well.  She now wears support stockings.  She has received COVID-19 vaccination.  All other systems reviewed and are negative.  EKGs/Labs/Other Studies Reviewed:  The following studies were reviewed today: No new cardiac imaging  EKG:  EKG normal sinus rhythm with poor R wave progression.  Recent Labs: 07/29/2019: ALT 15; Platelets 245 08/01/2019: Hemoglobin 9.4 11/25/2019: BUN 25; Creatinine, Ser 1.21; NT-Pro BNP 193; Potassium 4.3; Sodium 137  Recent Lipid Panel    Component Value Date/Time   CHOL 143 08/24/2018 1041   TRIG 108 08/24/2018 1041   HDL 58 08/24/2018 1041   CHOLHDL 2.5 08/24/2018 1041   CHOLHDL 3.3 04/17/2017 0014   VLDL 41 (H) 04/17/2017 0014   LDLCALC 63 08/24/2018 1041    Physical Exam:    VS:  BP 128/62   Pulse 64   Ht 5\' 1"  (1.549 m)   Wt 172 lb (78 kg)   SpO2 98%   BMI 32.50 kg/m     Wt Readings from Last 3 Encounters:  04/08/20 172 lb (78 kg)  07/31/19 164 lb (74.4 kg)  07/29/19 164 lb (74.4 kg)     GEN: Consistent with stated age.. No acute distress HEENT: Normal NECK: No JVD. LYMPHATICS: No lymphadenopathy CARDIAC:  RRR without murmur, gallop, or edema. VASCULAR:  Normal Pulses. No bruits. RESPIRATORY:  Clear to auscultation without rales, wheezing or rhonchi  ABDOMEN: Soft, non-tender, non-distended, No pulsatile mass, MUSCULOSKELETAL: No deformity  SKIN: Warm and dry NEUROLOGIC:  Alert and oriented x 3 PSYCHIATRIC:  Normal affect   ASSESSMENT:    1. Chronic diastolic CHF (congestive  heart failure) (HCC)   2. Coronary artery disease involving coronary bypass graft of native heart with angina pectoris (HCC)   3. Essential hypertension   4. Diabetes mellitus, type II, insulin dependent (HCC)   5. Familial hypercholesterolemia   6. Cerebrovascular accident (CVA) due to other mechanism (HCC)   7. Educated about COVID-19 virus infection    PLAN:    In order of problems listed above:  1. Evidence of volume overload.  Salt restricted diet. 2. Secondary prevention discussed.  She is more sedentary than I would like.  We discussed at least 50 minutes of moderate activity per week. 3. Cardio 130/80 mmHg.  Excellent result on current medical regimen which includes olmesartan/amlodipine/HCTZ, carvedilol 6.25 mg twice daily, and salt restriction. 4. Currently hemoglobin A1c is less than 7.  She is on Janumet XR, insulin degludec Evaristo Bury(Tresiba), and lispro insulin.  Since she has vascular disease, and SGLT2 at some point should be a consideration and perhaps lower heart drop Janumet intensity. 5. Continue Vascepa.  LDL cholesterol has always been relatively low.  Most recent triglyceride is 149. 6. No new cerebrovascular events or neuro symptoms.  Secondary prevention is being practiced. 7. She has been vaccinated and looks forward to receiving a booster if offered.   Medication Adjustments/Labs and Tests Ordered: Current medicines are reviewed at length with the patient today.  Concerns regarding medicines are outlined above.  No orders of the defined types were placed in this encounter.  No orders of the defined types were placed in this encounter.   Patient Instructions  Medication Instructions:  Your physician recommends that you continue on your current medications as directed. Please refer to the Current Medication list given to you today.  *If you need a refill on your cardiac medications before your next appointment, please call your pharmacy*   Lab Work: None If you  have labs (blood work) drawn today and your tests are completely normal, you will receive your results only by: Marland Kitchen. MyChart Message (if you have MyChart) OR . A paper  copy in the mail If you have any lab test that is abnormal or we need to change your treatment, we will call you to review the results.   Testing/Procedures: None   Follow-Up: At [facility], you and your health needs are our priority.  As part of our continuing mission to provide you with exceptional heart care, we have created designated Provider Care Teams.  These Care Teams include your primary Cardiologist (physician) and Advanced Practice Providers (APPs -  Physician Assistants and Nurse Practitioners) who all work together to provide you with the care you need, when you need it.  We recommend signing up for the patient portal called "MyChart".  Sign up information is provided on this After Visit Summary.  MyChart is used to connect with patients for Virtual Visits (Telemedicine).  Patients are able to view lab/test results, encounter notes, upcoming appointments, etc.  Non-urgent messages can be sent to your provider as well.   To learn more about what you can do with MyChart, go to [email or web address].    Your next appointment:   12 month(s)  The format for your next appointment:   In Person  Provider:   You may see Lesleigh Noe, MD or one of the following Advanced Practice Providers on your designated Care Team:    Norma Fredrickson, NP  Nada Boozer, NP  Georgie Chard, NP    Other Instructions      Signed, Lesleigh Noe, MD  04/08/2020 5:00 PM    [facility]

## 2020-04-08 ENCOUNTER — Other Ambulatory Visit: Payer: Self-pay

## 2020-04-08 ENCOUNTER — Encounter: Payer: Self-pay | Admitting: Interventional Cardiology

## 2020-04-08 ENCOUNTER — Ambulatory Visit: Payer: Medicare PPO | Admitting: Interventional Cardiology

## 2020-04-08 VITALS — BP 128/62 | HR 64 | Ht 61.0 in | Wt 172.0 lb

## 2020-04-08 DIAGNOSIS — I25709 Atherosclerosis of coronary artery bypass graft(s), unspecified, with unspecified angina pectoris: Secondary | ICD-10-CM

## 2020-04-08 DIAGNOSIS — I5032 Chronic diastolic (congestive) heart failure: Secondary | ICD-10-CM | POA: Diagnosis not present

## 2020-04-08 DIAGNOSIS — E7801 Familial hypercholesterolemia: Secondary | ICD-10-CM

## 2020-04-08 DIAGNOSIS — Z794 Long term (current) use of insulin: Secondary | ICD-10-CM

## 2020-04-08 DIAGNOSIS — I6389 Other cerebral infarction: Secondary | ICD-10-CM

## 2020-04-08 DIAGNOSIS — E119 Type 2 diabetes mellitus without complications: Secondary | ICD-10-CM | POA: Diagnosis not present

## 2020-04-08 DIAGNOSIS — I10 Essential (primary) hypertension: Secondary | ICD-10-CM | POA: Diagnosis not present

## 2020-04-08 DIAGNOSIS — Z7189 Other specified counseling: Secondary | ICD-10-CM | POA: Diagnosis not present

## 2020-04-08 NOTE — Patient Instructions (Signed)

## 2020-05-11 DIAGNOSIS — E1151 Type 2 diabetes mellitus with diabetic peripheral angiopathy without gangrene: Secondary | ICD-10-CM | POA: Diagnosis not present

## 2020-05-11 DIAGNOSIS — M2041 Other hammer toe(s) (acquired), right foot: Secondary | ICD-10-CM | POA: Diagnosis not present

## 2020-05-11 DIAGNOSIS — B351 Tinea unguium: Secondary | ICD-10-CM | POA: Diagnosis not present

## 2020-05-11 DIAGNOSIS — L84 Corns and callosities: Secondary | ICD-10-CM | POA: Diagnosis not present

## 2020-05-21 ENCOUNTER — Other Ambulatory Visit: Payer: Self-pay | Admitting: Physician Assistant

## 2020-06-01 ENCOUNTER — Other Ambulatory Visit (HOSPITAL_BASED_OUTPATIENT_CLINIC_OR_DEPARTMENT_OTHER): Payer: Self-pay | Admitting: Internal Medicine

## 2020-06-01 ENCOUNTER — Ambulatory Visit: Payer: Medicare PPO | Attending: Internal Medicine

## 2020-06-01 DIAGNOSIS — Z23 Encounter for immunization: Secondary | ICD-10-CM

## 2020-06-01 NOTE — Progress Notes (Signed)
   Covid-19 Vaccination Clinic  Name:  Andrea [name]    MRN: [id number] DOB: 30-Mar-1946  06/01/2020  [name] was observed post Covid-19 immunization for 15 minutes without incident. She was provided with Vaccine Information Sheet and instruction to access the V-Safe system. Vaccinated by Maryella Shivers.  [name] was instructed to call 911 with any severe reactions post vaccine: Marland Kitchen Difficulty breathing  . Swelling of face and throat  . A fast heartbeat  . A bad rash all over body  . Dizziness and weakness

## 2020-06-02 ENCOUNTER — Ambulatory Visit: Payer: Medicare PPO

## 2020-06-05 MED FILL — PFIZER-BIONTECH COVID-19 VA: 30 | 1 days supply | Qty: 0 | Fill #0

## 2020-06-22 DIAGNOSIS — M25569 Pain in unspecified knee: Secondary | ICD-10-CM | POA: Diagnosis not present

## 2020-07-21 DIAGNOSIS — L84 Corns and callosities: Secondary | ICD-10-CM | POA: Diagnosis not present

## 2020-07-21 DIAGNOSIS — B351 Tinea unguium: Secondary | ICD-10-CM | POA: Diagnosis not present

## 2020-07-21 DIAGNOSIS — E1151 Type 2 diabetes mellitus with diabetic peripheral angiopathy without gangrene: Secondary | ICD-10-CM | POA: Diagnosis not present

## 2020-07-21 DIAGNOSIS — M2041 Other hammer toe(s) (acquired), right foot: Secondary | ICD-10-CM | POA: Diagnosis not present

## 2020-08-14 DIAGNOSIS — E113299 Type 2 diabetes mellitus with mild nonproliferative diabetic retinopathy without macular edema, unspecified eye: Secondary | ICD-10-CM | POA: Diagnosis not present

## 2020-08-14 DIAGNOSIS — Z794 Long term (current) use of insulin: Secondary | ICD-10-CM | POA: Diagnosis not present

## 2020-08-14 DIAGNOSIS — Z23 Encounter for immunization: Secondary | ICD-10-CM | POA: Diagnosis not present

## 2020-08-14 DIAGNOSIS — I739 Peripheral vascular disease, unspecified: Secondary | ICD-10-CM | POA: Diagnosis not present

## 2020-08-14 DIAGNOSIS — I25810 Atherosclerosis of coronary artery bypass graft(s) without angina pectoris: Secondary | ICD-10-CM | POA: Diagnosis not present

## 2020-08-14 DIAGNOSIS — I10 Essential (primary) hypertension: Secondary | ICD-10-CM | POA: Diagnosis not present

## 2020-08-14 DIAGNOSIS — E7800 Pure hypercholesterolemia, unspecified: Secondary | ICD-10-CM | POA: Diagnosis not present

## 2020-08-14 DIAGNOSIS — E119 Type 2 diabetes mellitus without complications: Secondary | ICD-10-CM | POA: Diagnosis not present

## 2020-08-15 ENCOUNTER — Other Ambulatory Visit: Payer: Self-pay | Admitting: Interventional Cardiology

## 2020-08-22 ENCOUNTER — Other Ambulatory Visit: Payer: Self-pay | Admitting: Interventional Cardiology

## 2020-08-27 DIAGNOSIS — E010 Iodine-deficiency related diffuse (endemic) goiter: Secondary | ICD-10-CM | POA: Diagnosis not present

## 2020-08-27 DIAGNOSIS — I10 Essential (primary) hypertension: Secondary | ICD-10-CM | POA: Diagnosis not present

## 2020-08-27 DIAGNOSIS — I2510 Atherosclerotic heart disease of native coronary artery without angina pectoris: Secondary | ICD-10-CM | POA: Diagnosis not present

## 2020-08-27 DIAGNOSIS — E7800 Pure hypercholesterolemia, unspecified: Secondary | ICD-10-CM | POA: Diagnosis not present

## 2020-08-27 DIAGNOSIS — E1165 Type 2 diabetes mellitus with hyperglycemia: Secondary | ICD-10-CM | POA: Diagnosis not present

## 2020-10-06 DIAGNOSIS — B351 Tinea unguium: Secondary | ICD-10-CM | POA: Diagnosis not present

## 2020-10-06 DIAGNOSIS — E1151 Type 2 diabetes mellitus with diabetic peripheral angiopathy without gangrene: Secondary | ICD-10-CM | POA: Diagnosis not present

## 2020-10-06 DIAGNOSIS — M2041 Other hammer toe(s) (acquired), right foot: Secondary | ICD-10-CM | POA: Diagnosis not present

## 2020-10-06 DIAGNOSIS — L84 Corns and callosities: Secondary | ICD-10-CM | POA: Diagnosis not present

## 2020-10-19 ENCOUNTER — Emergency Department (HOSPITAL_BASED_OUTPATIENT_CLINIC_OR_DEPARTMENT_OTHER): Payer: Medicare PPO

## 2020-10-19 ENCOUNTER — Emergency Department (HOSPITAL_BASED_OUTPATIENT_CLINIC_OR_DEPARTMENT_OTHER)
Admission: EM | Admit: 2020-10-19 | Discharge: 2020-10-19 | Disposition: A | Discharge status: Home / Self Care | Payer: Medicare PPO | Attending: Emergency Medicine | Admitting: Emergency Medicine

## 2020-10-19 ENCOUNTER — Other Ambulatory Visit: Payer: Self-pay

## 2020-10-19 DIAGNOSIS — I2510 Atherosclerotic heart disease of native coronary artery without angina pectoris: Secondary | ICD-10-CM | POA: Diagnosis not present

## 2020-10-19 DIAGNOSIS — S61216A Laceration without foreign body of right little finger without damage to nail, initial encounter: Secondary | ICD-10-CM | POA: Diagnosis not present

## 2020-10-19 DIAGNOSIS — Z87891 Personal history of nicotine dependence: Secondary | ICD-10-CM | POA: Diagnosis not present

## 2020-10-19 DIAGNOSIS — I110 Hypertensive heart disease with heart failure: Secondary | ICD-10-CM | POA: Diagnosis not present

## 2020-10-19 DIAGNOSIS — Z951 Presence of aortocoronary bypass graft: Secondary | ICD-10-CM | POA: Diagnosis not present

## 2020-10-19 DIAGNOSIS — Z79899 Other long term (current) drug therapy: Secondary | ICD-10-CM | POA: Diagnosis not present

## 2020-10-19 DIAGNOSIS — E119 Type 2 diabetes mellitus without complications: Secondary | ICD-10-CM | POA: Insufficient documentation

## 2020-10-19 DIAGNOSIS — Z7982 Long term (current) use of aspirin: Secondary | ICD-10-CM | POA: Diagnosis not present

## 2020-10-19 DIAGNOSIS — I509 Heart failure, unspecified: Secondary | ICD-10-CM | POA: Insufficient documentation

## 2020-10-19 DIAGNOSIS — S6991XA Unspecified injury of right wrist, hand and finger(s), initial encounter: Secondary | ICD-10-CM | POA: Diagnosis present

## 2020-10-19 DIAGNOSIS — Z794 Long term (current) use of insulin: Secondary | ICD-10-CM | POA: Diagnosis not present

## 2020-10-19 DIAGNOSIS — W25XXXA Contact with sharp glass, initial encounter: Secondary | ICD-10-CM | POA: Insufficient documentation

## 2020-10-19 MED ORDER — LIDOCAINE HCL (PF) 1 % IJ SOLN: 5.0000 mL | Freq: Once | INTRAMUSCULAR | Status: AC

## 2020-10-19 MED ORDER — LIDOCAINE HCL (PF) 1 % IJ SOLN
INTRAMUSCULAR | Status: AC
  Administered 2020-10-19: 5 mL
  Filled 2020-10-19: qty 5

## 2020-10-19 NOTE — ED Provider Notes (Signed)
[facility] EMERGENCY DEPARTMENT Provider Note   CSN: [id number] Arrival date & time: 10/19/20  0151     History Chief Complaint  Patient presents with  . Laceration    Laceration to left 5th digit from glass.    Andrea [name] is a 75 y.o. female.   Laceration Location:  Finger Finger laceration location:  R little finger Length:  0.5 Depth:  Through dermis Quality: straight   Bleeding: venous   Laceration mechanism:  Broken glass Pain details:    Quality:  Aching   Severity:  No pain   Timing:  Constant Relieved by:  Nothing Worsened by:  Nothing Ineffective treatments:  None tried Tetanus status:  Up to date Associated symptoms: no fever and no rash        Past Medical History:  Diagnosis Date  . Anemia 04/30/2017  . CHF (congestive heart failure) (HCC)   . Diabetes mellitus   . Hypertension   . Hypokalemia 04/2017  . Macular degeneration disease    loss of central vision on left and decreased peripheral vision.   . Stroke (HCC) 12/2011   no residuals noted    Patient Active Problem List   Diagnosis Date Noted  . Enterocele 07/31/2019  . Coronary artery disease involving coronary bypass graft of native heart with angina pectoris (HCC) 04/30/2017  . SIRS (systemic inflammatory response syndrome) (HCC) 04/30/2017  . Symptomatic anemia 04/30/2017  . AKI (acute kidney injury) (HCC) 04/30/2017  . History of CVA in adulthood 04/30/2017  . Hypertensive heart disease 12/17/2015  . Diabetes mellitus, type II, insulin dependent (HCC) 01/04/2012  . Hypertension   . Stroke [facility](HCC)     Past Surgical History:  Procedure Laterality Date  . CHOLECYSTECTOMY    . CORONARY ARTERY BYPASS GRAFT N/A 04/18/2017   Procedure: CORONARY ARTERY BYPASS GRAFTING (CABG) times four using left internal mammary artery and right leg saphenous vein;  Surgeon: Kerin PernaVan Trigt, Peter, MD;  [facility];  Service: Open Heart Surgery;  Laterality: N/A;  . EYE SURGERY     right   . LEFT HEART CATH AND CORONARY ANGIOGRAPHY N/A 04/17/2017   Procedure: LEFT HEART CATH AND CORONARY ANGIOGRAPHY;  Surgeon: Marykay LexHarding, David W, MD;  [facility];  Service: Cardiovascular;  Laterality: N/A;  . PUBOVAGINAL SLING N/A 07/31/2019   Procedure: CYSTOSCOPY MID URETHRAL  SLING;  Surgeon: Crist FatHerrick, Benjamin W, MD;  Location: [facility] ORS;  Service: Urology;  Laterality: N/A;  . REFRACTIVE SURGERY Left   . ROBOTIC ASSISTED LAPAROSCOPIC SACROCOLPOPEXY N/A 07/31/2019   Procedure: XI ROBOTIC ASSISTED LAPAROSCOPIC SACROCOLPOPEXY WIHT SUPRACERVICAL HYSTERECTOMY;  Surgeon: Crist FatHerrick, Benjamin W, MD;  Location: [facility] ORS;  Service: Urology;  Laterality: N/A;  . ROTATOR CUFF REPAIR     left  . TEE WITHOUT CARDIOVERSION N/A 04/18/2017   Procedure: TRANSESOPHAGEAL ECHOCARDIOGRAM (TEE);  Surgeon: Donata ClayVan Trigt, Theron AristaPeter, MD;  [facility];  Service: Open Heart Surgery;  Laterality: N/A;     OB History   No obstetric history on file.     Family History  Problem Relation Age of Onset  . Heart attack Father   . Hypertension Father   . Heart attack Brother   . Hypertension Brother   . Hypertension Mother   . Stroke Mother   . Healthy Brother     Social History   Tobacco Use  . Smoking status: Former Smoker    Packs/day: 0.50    Years: 20.00    Pack years: 10.00  Types: Cigarettes    Quit date: 01/02/1997    Years since quitting: 23.8  . Smokeless tobacco: Never Used  Vaping Use  . Vaping Use: Never used  Substance Use Topics  . Alcohol use: No  . Drug use: No    Home Medications Prior to Admission medications   Medication Sig Start Date End Date Taking? Authorizing Provider  ALPRAZolam Prudy Feeler) 0.25 MG tablet Take 1 tablet by mouth as needed. 03/17/20   [provider]  aspirin EC 81 MG tablet Take 1 tablet (81 mg total) by mouth daily. 01/29/18   Lyn Records, MD  carvedilol (COREG) 6.25 MG tablet TAKE 1 TABLET BY MOUTH TWICE A DAY WITH MEALS 08/24/20   Lyn Records, MD  Cholecalciferol (VITAMIN D) 2000 UNITS tablet Take 2,000 Units by mouth daily.    [provider]  diphenhydramine-acetaminophen (TYLENOL PM) 25-500 MG TABS tablet Take 1 tablet by mouth at bedtime as needed.    [provider]  HUMALOG KWIKPEN 100 UNIT/ML KiwkPen Inject 2-15 Units into the skin 3 (three) times daily before meals. (Per sliding scale) 06/15/17   [provider]  JANUMET XR 50-500 MG TB24 Take 1 tablet by mouth 2 (two) times daily. 05/09/18   [provider]  MYRBETRIQ 25 MG TB24 tablet Take 25 mg by mouth daily. 07/19/19   [provider]  NOVOFINE PLUS 32G X 4 MM MISC UP TO FOUR TIMES DAILY AS DIRECTED 05/03/18   [provider]  Olmesartan-amLODIPine-HCTZ 40-5-25 MG TABS TAKE 1 TABLET BY MOUTH EVERY DAY 05/21/20   Lyn Records, MD  [facility] VERIO test strip USE TO TEST BLOOD SUGAR 4 TIMES DAILY 05/10/17   [provider]  TRESIBA FLEXTOUCH 100 UNIT/ML SOPN FlexTouch Pen Inject 24 Units into the skin every evening.  05/25/18   [provider]  VASCEPA 1 g capsule TAKE 1 CAPSULE (1 G TOTAL) BY MOUTH 2 (TWO) TIMES DAILY. 08/18/20   Lyn Records, MD    Allergies    Patient has no known allergies.  Review of Systems   Review of Systems  Constitutional: Negative for chills and fever.  HENT: Negative for congestion and rhinorrhea.   Respiratory: Negative for cough and shortness of breath.   Cardiovascular: Negative for chest pain and palpitations.  Gastrointestinal: Negative for diarrhea, nausea and vomiting.  Genitourinary: Negative for difficulty urinating and dysuria.  Musculoskeletal: Negative for arthralgias and back pain.  Skin: Positive for wound. Negative for rash.  Neurological: Negative for light-headedness and headaches.    Physical Exam Updated Vital Signs BP (!) 141/75 (BP Location: Left Arm)   Pulse 76   Temp 98 F (36.7 C) (Oral)   Resp 14   Ht 4\' 11"  (1.499 m)   Wt 74.4 kg    SpO2 96%   BMI 33.12 kg/m   Physical Exam Vitals and nursing note reviewed. Exam conducted with a chaperone present.  Constitutional:      General: She is not in acute distress.    Appearance: Normal appearance.  HENT:     Head: Normocephalic and atraumatic.     Nose: No rhinorrhea.  Eyes:     General:        Right eye: No discharge.        Left eye: No discharge.     Conjunctiva/sclera: Conjunctivae normal.  Cardiovascular:     Rate and Rhythm: Normal rate and regular rhythm.  Pulmonary:     Effort: Pulmonary effort  is normal. No respiratory distress.     Breath sounds: No stridor.  Abdominal:     General: Abdomen is flat. There is no distension.     Palpations: Abdomen is soft.  Musculoskeletal:        General: Signs of injury present. No tenderness.     Comments: Volar pinky lac, NVI distal  Skin:    General: Skin is warm and dry.     Capillary Refill: Capillary refill takes less than 2 seconds.  Neurological:     General: No focal deficit present.     Mental Status: She is alert. Mental status is at baseline.     Motor: No weakness.  Psychiatric:        Mood and Affect: Mood normal.        Behavior: Behavior normal.     ED Results / Procedures / Treatments   Labs (all labs ordered are listed, but only abnormal results are displayed) Labs Reviewed - No data to display  EKG None  Radiology DG Finger Little Right  Result Date: 10/19/2020 CLINICAL DATA:  Laceration EXAM: RIGHT LITTLE FINGER 2+V COMPARISON:  None. FINDINGS: There is no evidence of fracture or dislocation. There is no evidence of arthropathy or other focal bone abnormality. Soft tissues are unremarkable. IMPRESSION: Negative. Electronically Signed   By: [name] Pang M.D.   On: 10/19/2020 02:37    Procedures .Marland KitchenLaceration Repair  Date/Time: 10/19/2020 2:59 AM Performed by: Sabino Donovan, MD Authorized by: Sabino Donovan, MD   Consent:    Consent obtained:  Verbal   Consent given by:  Patient    Risks discussed:  Infection, pain, poor cosmetic result, need for additional repair and poor wound healing   Alternatives discussed:  No treatment Universal protocol:    Procedure explained and questions answered to patient or proxy's satisfaction: yes     Patient identity confirmed:  Verbally with patient Anesthesia:    Anesthesia method:  Nerve block   Block location:  Right little finger digital block   Block anesthetic:  Lidocaine 1% w/o epi Laceration details:    Location:  Finger   Finger location:  R small finger   Length (cm):  0.5 Exploration:    Imaging outcome: foreign body not noted     Contaminated: no   Treatment:    Area cleansed with:  Saline   Amount of cleaning:  Standard   Irrigation solution:  Sterile saline Skin repair:    Repair method:  Sutures   Suture size:  5-0   Suture material:  Nylon   Number of sutures:  3 Approximation:    Approximation:  Close Repair type:    Repair type:  Intermediate Post-procedure details:    Dressing:  Non-adherent dressing   Procedure completion:  Tolerated well, no immediate complications     Medications Ordered in ED Medications  lidocaine (PF) (XYLOCAINE) 1 % injection 5 mL (5 mLs Other Given by Other 10/19/20 0215)    ED Course  I have reviewed the triage vital signs and the nursing notes.  Pertinent labs & imaging results that were available during my care of the patient were reviewed by me and considered in my medical decision making (see chart for details).    MDM Rules/Calculators/A&P                          Lac from broken glass, will get XR for FB eval, will repair.  X-ray after radiology my review shows no acute fracture or malalignment no foreign body.  She is repaired as described above.  Wound care instructions given discharge home. Final Clinical Impression(s) / ED Diagnoses Final diagnoses:  Laceration of right little finger, foreign body presence unspecified, nail damage status unspecified,  initial encounter    Rx / DC Orders ED Discharge Orders    None       Sabino Donovan, MD 10/19/20 0301

## 2020-10-19 NOTE — Discharge Instructions (Addendum)
Keep your dressing on for 24 hours.  After removal keep wound clean and dry.  Clean it with warm soapy water.  And then blot dry.  Keep covered with a Band-Aid to keep it clean and dry.  Follow-up with any doctor for stitches to be removed in 7 days.  Return to [place] with any concerning signs or symptoms.

## 2020-10-26 DIAGNOSIS — Z4802 Encounter for removal of sutures: Secondary | ICD-10-CM | POA: Diagnosis not present

## 2020-11-17 ENCOUNTER — Ambulatory Visit: Payer: Medicare PPO | Attending: Internal Medicine

## 2020-11-17 DIAGNOSIS — Z23 Encounter for immunization: Secondary | ICD-10-CM

## 2020-11-17 NOTE — Progress Notes (Signed)
   Covid-19 Vaccination Clinic  Name:  [name]    MRN: [id number] DOB: 1946/01/22  11/17/2020  [name] was observed post Covid-19 immunization for 15 minutes without incident. She was provided with Vaccine Information Sheet and instruction to access the V-Safe system.   [name] was instructed to call 911 with any severe reactions post vaccine: Marland Kitchen Difficulty breathing  . Swelling of face and throat  . A fast heartbeat  . A bad rash all over body  . Dizziness and weakness   Immunizations Administered    Name Date Dose VIS Date Route   PFIZER Comrnaty(Gray TOP) Covid-19 Vaccine 11/17/2020  2:21 PM 0.3 mL 07/16/2020 Intramuscular   Manufacturer: [employer], [employer]   Lot: [id number]   NDC: [phone]

## 2020-11-23 ENCOUNTER — Other Ambulatory Visit (HOSPITAL_BASED_OUTPATIENT_CLINIC_OR_DEPARTMENT_OTHER): Payer: Self-pay

## 2020-11-23 MED ORDER — PFIZER-BIONT COVID-19 VAC-TRIS 30 MCG/0.3ML IM SUSP
INTRAMUSCULAR | 0 refills | Status: DC
  Filled 2020-11-23: qty 0.3, 1d supply, fill #0

## 2020-12-23 DIAGNOSIS — E1151 Type 2 diabetes mellitus with diabetic peripheral angiopathy without gangrene: Secondary | ICD-10-CM | POA: Diagnosis not present

## 2020-12-23 DIAGNOSIS — B351 Tinea unguium: Secondary | ICD-10-CM | POA: Diagnosis not present

## 2020-12-23 DIAGNOSIS — M2041 Other hammer toe(s) (acquired), right foot: Secondary | ICD-10-CM | POA: Diagnosis not present

## 2020-12-23 DIAGNOSIS — L84 Corns and callosities: Secondary | ICD-10-CM | POA: Diagnosis not present

## 2020-12-24 DIAGNOSIS — E7800 Pure hypercholesterolemia, unspecified: Secondary | ICD-10-CM | POA: Diagnosis not present

## 2020-12-24 DIAGNOSIS — I10 Essential (primary) hypertension: Secondary | ICD-10-CM | POA: Diagnosis not present

## 2020-12-24 DIAGNOSIS — E010 Iodine-deficiency related diffuse (endemic) goiter: Secondary | ICD-10-CM | POA: Diagnosis not present

## 2020-12-24 DIAGNOSIS — E1165 Type 2 diabetes mellitus with hyperglycemia: Secondary | ICD-10-CM | POA: Diagnosis not present

## 2020-12-24 DIAGNOSIS — I2510 Atherosclerotic heart disease of native coronary artery without angina pectoris: Secondary | ICD-10-CM | POA: Diagnosis not present

## 2021-02-26 DIAGNOSIS — E1122 Type 2 diabetes mellitus with diabetic chronic kidney disease: Secondary | ICD-10-CM | POA: Diagnosis not present

## 2021-02-26 DIAGNOSIS — Z794 Long term (current) use of insulin: Secondary | ICD-10-CM | POA: Diagnosis not present

## 2021-02-26 DIAGNOSIS — I10 Essential (primary) hypertension: Secondary | ICD-10-CM | POA: Diagnosis not present

## 2021-02-26 DIAGNOSIS — E1151 Type 2 diabetes mellitus with diabetic peripheral angiopathy without gangrene: Secondary | ICD-10-CM | POA: Diagnosis not present

## 2021-02-26 DIAGNOSIS — I25810 Atherosclerosis of coronary artery bypass graft(s) without angina pectoris: Secondary | ICD-10-CM | POA: Diagnosis not present

## 2021-02-26 DIAGNOSIS — Z0000 Encounter for general adult medical examination without abnormal findings: Secondary | ICD-10-CM | POA: Diagnosis not present

## 2021-02-26 DIAGNOSIS — Z1389 Encounter for screening for other disorder: Secondary | ICD-10-CM | POA: Diagnosis not present

## 2021-02-26 DIAGNOSIS — E7800 Pure hypercholesterolemia, unspecified: Secondary | ICD-10-CM | POA: Diagnosis not present

## 2021-02-26 DIAGNOSIS — Z23 Encounter for immunization: Secondary | ICD-10-CM | POA: Diagnosis not present

## 2021-02-26 DIAGNOSIS — I739 Peripheral vascular disease, unspecified: Secondary | ICD-10-CM | POA: Diagnosis not present

## 2021-02-26 DIAGNOSIS — E113299 Type 2 diabetes mellitus with mild nonproliferative diabetic retinopathy without macular edema, unspecified eye: Secondary | ICD-10-CM | POA: Diagnosis not present

## 2021-03-02 DIAGNOSIS — M2041 Other hammer toe(s) (acquired), right foot: Secondary | ICD-10-CM | POA: Diagnosis not present

## 2021-03-02 DIAGNOSIS — E1151 Type 2 diabetes mellitus with diabetic peripheral angiopathy without gangrene: Secondary | ICD-10-CM | POA: Diagnosis not present

## 2021-03-02 DIAGNOSIS — B351 Tinea unguium: Secondary | ICD-10-CM | POA: Diagnosis not present

## 2021-03-02 DIAGNOSIS — L84 Corns and callosities: Secondary | ICD-10-CM | POA: Diagnosis not present

## 2021-03-30 DIAGNOSIS — H50111 Monocular exotropia, right eye: Secondary | ICD-10-CM | POA: Diagnosis not present

## 2021-03-30 DIAGNOSIS — H2512 Age-related nuclear cataract, left eye: Secondary | ICD-10-CM | POA: Diagnosis not present

## 2021-03-30 DIAGNOSIS — H43813 Vitreous degeneration, bilateral: Secondary | ICD-10-CM | POA: Diagnosis not present

## 2021-03-30 DIAGNOSIS — E119 Type 2 diabetes mellitus without complications: Secondary | ICD-10-CM | POA: Diagnosis not present

## 2021-05-02 NOTE — Progress Notes (Deleted)
Cardiology Office Note:    Date:  05/02/2021   ID:  Andrea [name], DOB 1946-03-18, MRN [id number]  PCP:  Renford Dills, MD  Cardiologist:  Lesleigh Noe, MD   Referring MD: Renford Dills, MD   No chief complaint on file.   History of Present Illness:    Andrea [name] is a 75 y.o. female with a hx of coronary bypass grafting 04/2017 (LIMA to LAD, SVG to diagonal, SVG to OM) September 2018 after presenting with non-ST elevation myocardial infarction, diabetes mellitus type 2, hypertension, and prior history of CVA. Acute on chronic right cerebral white matter infarct-with advanced small vessel disease. Left facial droop, 2013) neurogenic right arm discomfort post bypass surgery.   ***  Past Medical History:  Diagnosis Date   Anemia 04/30/2017   CHF (congestive heart failure) (HCC)    Diabetes mellitus    Hypertension    Hypokalemia 04/2017   Macular degeneration disease    loss of central vision on left and decreased peripheral vision.    Stroke (HCC) 12/2011   no residuals noted    Past Surgical History:  Procedure Laterality Date   CHOLECYSTECTOMY     CORONARY ARTERY BYPASS GRAFT N/A 04/18/2017   Procedure: CORONARY ARTERY BYPASS GRAFTING (CABG) times four using left internal mammary artery and right leg saphenous vein;  Surgeon: Kerin Perna, MD;  [facility];  Service: Open Heart Surgery;  Laterality: N/A;   EYE SURGERY     right   LEFT HEART CATH AND CORONARY ANGIOGRAPHY N/A 04/17/2017   Procedure: LEFT HEART CATH AND CORONARY ANGIOGRAPHY;  Surgeon: Marykay Lex, MD;  Location: [facility] INVASIVE CV LAB;  Service: Cardiovascular;  Laterality: N/A;   PUBOVAGINAL SLING N/A 07/31/2019   Procedure: CYSTOSCOPY MID URETHRAL  SLING;  Surgeon: Crist Fat, MD;  Location: [facility] ORS;  Service: Urology;  Laterality: N/A;   REFRACTIVE SURGERY Left    ROBOTIC ASSISTED LAPAROSCOPIC SACROCOLPOPEXY N/A 07/31/2019   Procedure: XI ROBOTIC ASSISTED LAPAROSCOPIC  SACROCOLPOPEXY WIHT SUPRACERVICAL HYSTERECTOMY;  Surgeon: Crist Fat, MD;  Location: [facility] ORS;  Service: Urology;  Laterality: N/A;   ROTATOR CUFF REPAIR     left   TEE WITHOUT CARDIOVERSION N/A 04/18/2017   Procedure: TRANSESOPHAGEAL ECHOCARDIOGRAM (TEE);  Surgeon: Donata Clay, Theron Arista, MD;  Location: [facility] OR;  Service: Open Heart Surgery;  Laterality: N/A;    Current Medications: No outpatient medications have been marked as taking for the 05/04/21 encounter (Appointment) with Lyn Records, MD.     Allergies:   Patient has no known allergies.   Social History   Socioeconomic History   Marital status: Married    Spouse name: Not on file   Number of children: 3   Years of education: 16   Highest education level: Not on file  Occupational History   Occupation: Professor    Associate Professor: [employer]  Tobacco Use   Smoking status: Former    Packs/day: 0.50    Years: 20.00    Pack years: 10.00    Types: Cigarettes    Quit date: 01/02/1997    Years since quitting: 24.3   Smokeless tobacco: Never  Vaping Use   Vaping Use: Never used  Substance and Sexual Activity   Alcohol use: No   Drug use: No   Sexual activity: Not on file  Other Topics Concern   Not on file  Social History Narrative   Lives with husband and 3 children in a 2  story home.  Retired professor at [employer].  Education: college.    Social Determinants of Health   Financial Resource Strain: Not on file  Food Insecurity: Not on file  Transportation Needs: Not on file  Physical Activity: Not on file  Stress: Not on file  Social Connections: Not on file     Family History: The patient's family history includes Healthy in her brother; Heart attack in her brother and father; Hypertension in her brother, father, and mother; Stroke in her mother.  ROS:   Please see the history of present illness.    *** All other systems reviewed and are negative.  EKGs/Labs/Other Studies Reviewed:    The following studies  were reviewed today:  BILATERAL LE DOPPLER 02/2020: IMPRESSION: 1. Mildly abnormal resting right ankle and toe brachial index consistent with borderline peripheral arterial disease. 2. Normal resting left ankle and toe brachial indices. No evidence of significant stenosis or occlusion.  2 D Doppler ECHOCARDIOGRAM 2018: Study Conclusions   - Left ventricle: The cavity size was normal. Wall thickness was    increased in a pattern of mild LVH. Systolic function was normal.    The estimated ejection fraction was in the range of 60% to 65%.    There is hypokinesis of the basalinferolateral myocardium.    Doppler parameters are consistent with abnormal left ventricular    relaxation (grade 1 diastolic dysfunction). Doppler parameters    are consistent with high ventricular filling pressure.  - Mitral valve: Calcified annulus. There was mild regurgitation.   Impressions:   - Hypokinesis of the basal inferolateral wall with overall normal    LV systolic function; mild diastolic dysfunction; mild LVH; mild    MR and TR.   EKG:  EKG ***  Recent Labs: No results found for requested labs within last 8760 hours.  Recent Lipid Panel    Component Value Date/Time   CHOL 143 08/24/2018 1041   TRIG 108 08/24/2018 1041   HDL 58 08/24/2018 1041   CHOLHDL 2.5 08/24/2018 1041   CHOLHDL 3.3 04/17/2017 0014   VLDL 41 (H) 04/17/2017 0014   LDLCALC 63 08/24/2018 1041    Physical Exam:    VS:  There were no vitals taken for this visit.    Wt Readings from Last 3 Encounters:  10/19/20 164 lb (74.4 kg)  04/08/20 172 lb (78 kg)  07/31/19 164 lb (74.4 kg)     GEN: ***. No acute distress HEENT: Normal NECK: No JVD. LYMPHATICS: No lymphadenopathy CARDIAC: *** murmur. RRR *** gallop, or edema. VASCULAR: *** Normal Pulses. No bruits. RESPIRATORY:  Clear to auscultation without rales, wheezing or rhonchi  ABDOMEN: Soft, non-tender, non-distended, No pulsatile mass, MUSCULOSKELETAL: No  deformity  SKIN: Warm and dry NEUROLOGIC:  Alert and oriented x 3 PSYCHIATRIC:  Normal affect   ASSESSMENT:    1. Coronary artery disease involving coronary bypass graft of native heart with angina pectoris (HCC)   2. Chronic diastolic CHF (congestive heart failure) (HCC)   3. Essential hypertension   4. Diabetes mellitus, type II, insulin dependent (HCC)   5. Cerebrovascular accident (CVA) due to other mechanism (HCC)   6. Familial hypercholesterolemia    PLAN:    In order of problems listed above:  ***   Medication Adjustments/Labs and Tests Ordered: Current medicines are reviewed at length with the patient today.  Concerns regarding medicines are outlined above.  No orders of the defined types were placed in this encounter.  No orders of the defined  types were placed in this encounter.   There are no Patient Instructions on file for this visit.   Signed, Lesleigh Noe, MD  05/02/2021 5:23 PM    [facility]

## 2021-05-04 ENCOUNTER — Ambulatory Visit: Payer: Medicare PPO | Admitting: Interventional Cardiology

## 2021-05-04 DIAGNOSIS — I10 Essential (primary) hypertension: Secondary | ICD-10-CM

## 2021-05-04 DIAGNOSIS — I6389 Other cerebral infarction: Secondary | ICD-10-CM

## 2021-05-04 DIAGNOSIS — E7801 Familial hypercholesterolemia: Secondary | ICD-10-CM

## 2021-05-04 DIAGNOSIS — I25709 Atherosclerosis of coronary artery bypass graft(s), unspecified, with unspecified angina pectoris: Secondary | ICD-10-CM

## 2021-05-04 DIAGNOSIS — E119 Type 2 diabetes mellitus without complications: Secondary | ICD-10-CM

## 2021-05-04 DIAGNOSIS — I5032 Chronic diastolic (congestive) heart failure: Secondary | ICD-10-CM

## 2021-05-05 ENCOUNTER — Other Ambulatory Visit: Payer: Self-pay | Admitting: Interventional Cardiology

## 2021-05-05 DIAGNOSIS — L84 Corns and callosities: Secondary | ICD-10-CM | POA: Diagnosis not present

## 2021-05-05 DIAGNOSIS — E1151 Type 2 diabetes mellitus with diabetic peripheral angiopathy without gangrene: Secondary | ICD-10-CM | POA: Diagnosis not present

## 2021-05-05 DIAGNOSIS — M2041 Other hammer toe(s) (acquired), right foot: Secondary | ICD-10-CM | POA: Diagnosis not present

## 2021-05-05 DIAGNOSIS — B351 Tinea unguium: Secondary | ICD-10-CM | POA: Diagnosis not present

## 2021-05-16 ENCOUNTER — Other Ambulatory Visit: Payer: Self-pay | Admitting: Interventional Cardiology

## 2021-05-19 ENCOUNTER — Ambulatory Visit: Payer: Medicare PPO | Attending: Internal Medicine

## 2021-05-19 DIAGNOSIS — Z23 Encounter for immunization: Secondary | ICD-10-CM

## 2021-05-19 NOTE — Progress Notes (Signed)
   Covid-19 Vaccination Clinic  Name:  Andrea [name]    MRN: [id number] DOB: 09-20-1945  05/19/2021  [name] was observed post Covid-19 immunization for 15 minutes without incident. She was provided with Vaccine Information Sheet and instruction to access the V-Safe system.   [name] was instructed to call 911 with any severe reactions post vaccine: Difficulty breathing  Swelling of face and throat  A fast heartbeat  A bad rash all over body  Dizziness and weakness

## 2021-06-07 ENCOUNTER — Other Ambulatory Visit (HOSPITAL_BASED_OUTPATIENT_CLINIC_OR_DEPARTMENT_OTHER): Payer: Self-pay

## 2021-06-07 MED ORDER — PFIZER COVID-19 VAC BIVALENT 30 MCG/0.3ML IM SUSP
INTRAMUSCULAR | 0 refills | Status: DC
  Filled 2021-06-07: qty 0.3, 1d supply, fill #0

## 2021-06-25 DIAGNOSIS — E1165 Type 2 diabetes mellitus with hyperglycemia: Secondary | ICD-10-CM | POA: Diagnosis not present

## 2021-06-25 DIAGNOSIS — E010 Iodine-deficiency related diffuse (endemic) goiter: Secondary | ICD-10-CM | POA: Diagnosis not present

## 2021-06-25 DIAGNOSIS — E7800 Pure hypercholesterolemia, unspecified: Secondary | ICD-10-CM | POA: Diagnosis not present

## 2021-06-28 DIAGNOSIS — E1151 Type 2 diabetes mellitus with diabetic peripheral angiopathy without gangrene: Secondary | ICD-10-CM | POA: Diagnosis not present

## 2021-06-28 DIAGNOSIS — L84 Corns and callosities: Secondary | ICD-10-CM | POA: Diagnosis not present

## 2021-06-28 DIAGNOSIS — B351 Tinea unguium: Secondary | ICD-10-CM | POA: Diagnosis not present

## 2021-06-28 DIAGNOSIS — M2041 Other hammer toe(s) (acquired), right foot: Secondary | ICD-10-CM | POA: Diagnosis not present

## 2021-06-29 DIAGNOSIS — E1165 Type 2 diabetes mellitus with hyperglycemia: Secondary | ICD-10-CM | POA: Diagnosis not present

## 2021-06-29 DIAGNOSIS — E7800 Pure hypercholesterolemia, unspecified: Secondary | ICD-10-CM | POA: Diagnosis not present

## 2021-06-29 DIAGNOSIS — E010 Iodine-deficiency related diffuse (endemic) goiter: Secondary | ICD-10-CM | POA: Diagnosis not present

## 2021-06-29 DIAGNOSIS — I2510 Atherosclerotic heart disease of native coronary artery without angina pectoris: Secondary | ICD-10-CM | POA: Diagnosis not present

## 2021-06-29 DIAGNOSIS — I10 Essential (primary) hypertension: Secondary | ICD-10-CM | POA: Diagnosis not present

## 2021-07-04 NOTE — Progress Notes (Signed)
Cardiology Office Note:    Date:  07/06/2021   ID:  [name], DOB 16-Feb-1946, MRN [id number]  PCP:  Andrea Dills, MD  Cardiologist:  Andrea Noe, MD   Referring MD: Andrea Dills, MD   Chief Complaint  Patient presents with   Coronary Artery Disease   Hypertension   Hyperlipidemia    History of Present Illness:    Andrea [name] is a 75 y.o. female with a hx of coronary bypass grafting 04/2017 (LIMA to LAD, SVG to diagonal, SVG to OM) September 2018 after presenting with non-ST elevation myocardial infarction, diabetes mellitus type 2, hypertension, and prior history of CVA. Acute on chronic right cerebral white matter infarct-with advanced small vessel disease. Left facial droop, 2013) neurogenic right arm discomfort post bypass surgery.    She is doing better than before.  A lot of the arm and chest discomfort that were residual from surgery has completely resolved.  She has better range of motion of her right upper extremity.  She denies orthopnea, PND, and dyspnea on exertion.    Past Medical History:  Diagnosis Date   Anemia 04/30/2017   CHF (congestive heart failure) (HCC)    Diabetes mellitus    Hypertension    Hypokalemia 04/2017   Macular degeneration disease    loss of central vision on left and decreased peripheral vision.    Stroke (HCC) 12/2011   no residuals noted    Past Surgical History:  Procedure Laterality Date   CHOLECYSTECTOMY     CORONARY ARTERY BYPASS GRAFT N/A 04/18/2017   Procedure: CORONARY ARTERY BYPASS GRAFTING (CABG) times four using left internal mammary artery and right leg saphenous vein;  Surgeon: Andrea Perna, MD;  [facility]: [facility];  Service: Open Heart Surgery;  Laterality: N/A;   EYE SURGERY     right   LEFT HEART CATH AND CORONARY ANGIOGRAPHY N/A 04/17/2017   Procedure: LEFT HEART CATH AND CORONARY ANGIOGRAPHY;  Surgeon: Andrea Lex, MD;  Location: [facility] INVASIVE CV LAB;  Service: Cardiovascular;  Laterality:  N/A;   PUBOVAGINAL SLING N/A 07/31/2019   Procedure: CYSTOSCOPY MID URETHRAL  SLING;  Surgeon: Andrea Fat, MD;  Location: [facility] ORS;  Service: Urology;  Laterality: N/A;   REFRACTIVE SURGERY Left    ROBOTIC ASSISTED LAPAROSCOPIC SACROCOLPOPEXY N/A 07/31/2019   Procedure: XI ROBOTIC ASSISTED LAPAROSCOPIC SACROCOLPOPEXY WIHT SUPRACERVICAL HYSTERECTOMY;  Surgeon: Andrea Fat, MD;  Location: [facility] ORS;  Service: Urology;  Laterality: N/A;   ROTATOR CUFF REPAIR     left   TEE WITHOUT CARDIOVERSION N/A 04/18/2017   Procedure: TRANSESOPHAGEAL ECHOCARDIOGRAM (TEE);  Surgeon: Andrea [name], Theron Arista, MD;  Location: [facility] OR;  Service: Open Heart Surgery;  Laterality: N/A;    Current Medications: Current Meds  Medication Sig   ALPRAZolam (XANAX) 0.25 MG tablet Take 1 tablet by mouth as needed.   aspirin EC 81 MG tablet Take 1 tablet (81 mg total) by mouth daily.   carvedilol (COREG) 6.25 MG tablet TAKE 1 TABLET BY MOUTH TWICE A DAY WITH MEALS   Cholecalciferol (VITAMIN D) 2000 UNITS tablet Take 2,000 Units by mouth daily.   COVID-19 mRNA bivalent vaccine, Pfizer, (PFIZER COVID-19 VAC BIVALENT) injection Inject into the muscle.   COVID-19 mRNA Vac-TriS, Pfizer, (PFIZER-BIONT COVID-19 VAC-TRIS) SUSP injection Inject into the muscle.   diphenhydramine-acetaminophen (TYLENOL PM) 25-500 MG TABS tablet Take 1 tablet by mouth at bedtime as needed.   HUMALOG KWIKPEN 100 UNIT/ML KiwkPen Inject 2-15 Units into the skin  3 (three) times daily before meals. (Per sliding scale)   icosapent Ethyl (VASCEPA) 1 g capsule Take 1 capsule (1 g total) by mouth 2 (two) times daily. Please keep upcoming appt in November 2022 with Dr. Katrinka Blazing before anymore refills. Thank you   JANUMET XR 50-500 MG TB24 Take 1 tablet by mouth 2 (two) times daily.   MYRBETRIQ 25 MG TB24 tablet Take 25 mg by mouth daily.   NOVOFINE PLUS 32G X 4 MM MISC UP TO FOUR TIMES DAILY AS DIRECTED   Olmesartan-amLODIPine-HCTZ 40-5-25 MG TABS TAKE 1 TABLET  BY MOUTH EVERY DAY   ONETOUCH VERIO test strip USE TO TEST BLOOD SUGAR 4 TIMES DAILY   TRESIBA FLEXTOUCH 100 UNIT/ML SOPN FlexTouch Pen Inject 26 Units into the skin every evening.     Allergies:   Patient has no known allergies.   Social History   Socioeconomic History   Marital status: Married    Spouse name: Not on file   Number of children: 3   Years of education: 16   Highest education level: Not on file  Occupational History   Occupation: Professor    Associate Professor: [employer]  Tobacco Use   Smoking status: Former    Packs/day: 0.50    Years: 20.00    Pack years: 10.00    Types: Cigarettes    Quit date: 01/02/1997    Years since quitting: 24.5   Smokeless tobacco: Never  Vaping Use   Vaping Use: Never used  Substance and Sexual Activity   Alcohol use: No   Drug use: No   Sexual activity: Not on file  Other Topics Concern   Not on file  Social History Narrative   Lives with husband and 3 children in a 2 story home.  Retired professor at [employer].  Education: college.    Social Determinants of Health   Financial Resource Strain: Not on file  Food Insecurity: Not on file  Transportation Needs: Not on file  Physical Activity: Not on file  Stress: Not on file  Social Connections: Not on file     Family History: The patient's family history includes Healthy in her brother; Heart attack in her brother and father; Hypertension in her brother, father, and mother; Stroke in her mother.  ROS:   Please see the history of present illness.    She is having more difficulty with ambulation due to imbalance and peripheral neuropathy.  No falls reported.  All other systems reviewed and are negative.  EKGs/Labs/Other Studies Reviewed:    The following studies were reviewed today: No new imaging  EKG:  EKG sinus rhythm 66 bpm, leftward axis, interventricular conduction delay.  Otherwise normal.  No change from 2020 is noted.  Recent Labs: No results found for requested labs  within last 8760 hours.  Recent Lipid Panel    Component Value Date/Time   CHOL 143 08/24/2018 1041   TRIG 108 08/24/2018 1041   HDL 58 08/24/2018 1041   CHOLHDL 2.5 08/24/2018 1041   CHOLHDL 3.3 04/17/2017 0014   VLDL 41 (H) 04/17/2017 0014   LDLCALC 63 08/24/2018 1041    Physical Exam:    VS:  BP 130/70   Pulse 66   Ht 4\' 11"  (1.499 m)   Wt 170 lb 4 oz (77.2 kg)   SpO2 98%   BMI 34.39 kg/m     Wt Readings from Last 3 Encounters:  07/06/21 170 lb 4 oz (77.2 kg)  10/19/20 164 lb (74.4 kg)  04/08/20 172 lb (78 kg)     GEN: Overweight. No acute distress HEENT: Normal NECK: No JVD. LYMPHATICS: No lymphadenopathy CARDIAC: No murmur. RRR no gallop, or edema. VASCULAR:  Normal Pulses. No bruits. RESPIRATORY:  Clear to auscultation without rales, wheezing or rhonchi  ABDOMEN: Soft, non-tender, non-distended, No pulsatile mass, MUSCULOSKELETAL: No deformity  SKIN: Warm and dry NEUROLOGIC:  Alert and oriented x 3 PSYCHIATRIC:  Normal affect   ASSESSMENT:    1. Chronic diastolic CHF (congestive heart failure) (HCC)   2. Coronary artery disease involving coronary bypass graft of native heart with angina pectoris (HCC)   3. Essential hypertension   4. Diabetes mellitus, type II, insulin dependent (HCC)   5. Familial hypercholesterolemia   6. Cerebrovascular accident (CVA) due to other mechanism [facility])    PLAN:    In order of problems listed above:  No evidence of volume overload other than mild lower extremity edema.  With her A1c still being mildly elevated at 10 clinical evidence of diastolic heart failure, however recommend an SGLT2.  I recommended that she speak to Dr. Cleon Gustin about starting Marcelline Deist or Jardiance at 10 mg daily. Secondary prevention is reviewed.  We also discussed metrics for blood pressure, diabetes control, and lipids. Aerobic activity discussed.  Low-salt diet discussed.  Target 130/80 is being achieved. Consider adding an SGLT2 Continue high  intensity statin therapy and Vascepa.  Her records indicate that she is not on a statin.  We will follow-up to determine why. Continue secondary prevention.  We need to resume statin therapy, low-dose of possible.  Overall education and awareness concerning secondary risk prevention was discussed in detail: LDL less than 70, hemoglobin A1c less than 7, blood pressure target less than 130/80 mmHg, >150 minutes of moderate aerobic activity per week, avoidance of smoking, weight control (via diet and exercise), and continued surveillance/management of/for obstructive sleep apnea.    Medication Adjustments/Labs and Tests Ordered: Current medicines are reviewed at length with the patient today.  Concerns regarding medicines are outlined above.  No orders of the defined types were placed in this encounter.  No orders of the defined types were placed in this encounter.   Patient Instructions  Medication Instructions:  Your physician recommends that you continue on your current medications as directed. Please refer to the Current Medication list given to you today.  *If you need a refill on your cardiac medications before your next appointment, please call your pharmacy*   Lab Work: None If you have labs (blood work) drawn today and your tests are completely normal, you will receive your results only by: MyChart Message (if you have MyChart) OR A paper copy in the mail If you have any lab test that is abnormal or we need to change your treatment, we will call you to review the results.   Testing/Procedures: None   Follow-Up: At [facility], you and your health needs are our priority.  As part of our continuing mission to provide you with exceptional heart care, we have created designated Provider Care Teams.  These Care Teams include your primary Cardiologist (physician) and Advanced Practice Providers (APPs -  Physician Assistants and Nurse Practitioners) who all work together to  provide you with the care you need, when you need it.  We recommend signing up for the patient portal called "MyChart".  Sign up information is provided on this After Visit Summary.  MyChart is used to connect with patients for Virtual Visits (Telemedicine).  Patients are able to  view lab/test results, encounter notes, upcoming appointments, etc.  Non-urgent messages can be sent to your provider as well.   To learn more about what you can do with MyChart, go to [email or web address].    Your next appointment:   1 year(s)  The format for your next appointment:   In Person  Provider:   Lesleigh Noe, MD     Other Instructions     Signed, Andrea Noe, MD  07/06/2021 3:05 PM    [facility]

## 2021-07-06 ENCOUNTER — Ambulatory Visit: Payer: Medicare PPO | Admitting: Interventional Cardiology

## 2021-07-06 ENCOUNTER — Encounter: Payer: Self-pay | Admitting: Interventional Cardiology

## 2021-07-06 ENCOUNTER — Other Ambulatory Visit: Payer: Self-pay

## 2021-07-06 VITALS — BP 130/70 | HR 66 | Ht 59.0 in | Wt 170.2 lb

## 2021-07-06 DIAGNOSIS — I10 Essential (primary) hypertension: Secondary | ICD-10-CM | POA: Diagnosis not present

## 2021-07-06 DIAGNOSIS — E7801 Familial hypercholesterolemia: Secondary | ICD-10-CM

## 2021-07-06 DIAGNOSIS — I25709 Atherosclerosis of coronary artery bypass graft(s), unspecified, with unspecified angina pectoris: Secondary | ICD-10-CM | POA: Diagnosis not present

## 2021-07-06 DIAGNOSIS — Z794 Long term (current) use of insulin: Secondary | ICD-10-CM | POA: Diagnosis not present

## 2021-07-06 DIAGNOSIS — E119 Type 2 diabetes mellitus without complications: Secondary | ICD-10-CM

## 2021-07-06 DIAGNOSIS — I5032 Chronic diastolic (congestive) heart failure: Secondary | ICD-10-CM

## 2021-07-06 DIAGNOSIS — I6389 Other cerebral infarction: Secondary | ICD-10-CM

## 2021-07-06 NOTE — Patient Instructions (Signed)

## 2021-07-08 DIAGNOSIS — E669 Obesity, unspecified: Secondary | ICD-10-CM | POA: Diagnosis not present

## 2021-07-08 DIAGNOSIS — R197 Diarrhea, unspecified: Secondary | ICD-10-CM | POA: Diagnosis not present

## 2021-07-08 NOTE — Addendum Note (Signed)
Addended byDurenda Hurt on: 07/08/2021 01:35 PM   Modules accepted: Orders

## 2021-07-09 ENCOUNTER — Telehealth: Payer: Self-pay | Admitting: Interventional Cardiology

## 2021-07-09 NOTE — Telephone Encounter (Signed)
Spoke with pt and she states she has never been told that she needed to be on anything for cholesterol other than Vascepa.  Explained to her about the benefits of taking a statin with her heart hx and diabetes.  Pt verbalized understanding but she wants to think about it and will call me back once she makes up her mind.

## 2021-07-09 NOTE — Telephone Encounter (Signed)
Pt seen Dr. Katrinka Blazing on 11/29 and after leaving, Dr. Katrinka Blazing realized she was not on a statin.  He asked that I reach out to her and find out why she's not.  Called pt and left message to call back.

## 2021-07-15 ENCOUNTER — Telehealth: Payer: Self-pay | Admitting: Interventional Cardiology

## 2021-07-15 DIAGNOSIS — I25709 Atherosclerosis of coronary artery bypass graft(s), unspecified, with unspecified angina pectoris: Secondary | ICD-10-CM

## 2021-07-15 DIAGNOSIS — E7801 Familial hypercholesterolemia: Secondary | ICD-10-CM

## 2021-07-15 NOTE — Telephone Encounter (Signed)
Pt is wanting to speak w/ Dr. Lonn Georgia nurse in regards to medications... please advise

## 2021-07-15 NOTE — Telephone Encounter (Signed)
Returned pt's call to discuss her medications as requested.  She stated Victorino Dike had told her Dr Katrinka Blazing wanted her to be started on Crestor and Farxiga.  Per Dr Michaelle Copas 11/29 office visit note:  No evidence of volume overload other than mild lower extremity edema.  With her A1c still being mildly elevated at 10 clinical evidence of diastolic heart failure, however recommend an SGLT2.  I recommended that she speak to Dr. Cleon Gustin about starting Marcelline Deist or Jardiance at 10 mg daily. Secondary prevention is reviewed.  We also discussed metrics for blood pressure, diabetes control, and lipids. Aerobic activity discussed.  Low-salt diet discussed.  Target 130/80 is being achieved. Consider adding an SGLT2 Continue high intensity statin therapy and Vascepa.  Her records indicate that she is not on a statin.  We will follow-up to determine why. Continue secondary prevention.   We need to resume statin therapy, low-dose of possible.   Will have Victorino Dike review and call pt back regarding any new medication orders as I do not see specific orders regarding Crestor and see above Dr Katrinka Blazing requests her speak with Dr Cleon Gustin regarding Marcelline Deist or [place].

## 2021-07-19 NOTE — Telephone Encounter (Signed)
What dose of Crestor do you want her to start?

## 2021-07-20 MED ORDER — ROSUVASTATIN CALCIUM 10 MG PO TABS: 10.0000 mg | ORAL_TABLET | Freq: Every day | ORAL | 3 refills | Status: DC

## 2021-07-20 NOTE — Telephone Encounter (Signed)
10 mg daily then get liver and lipid in 6 weeks.  ----- Message -----  From: Julio Sicks, RN  Sent: 07/19/2021  11:38 AM EST  To: Lyn Records, MD, Julio Sicks, RN   Spoke with pt and made her aware of recommendations.  She is agreeable to plan.  She will come in for labs on 09/03/21.  Advised to call sooner if any issues.

## 2021-07-23 ENCOUNTER — Other Ambulatory Visit: Payer: Self-pay | Admitting: Interventional Cardiology

## 2021-07-26 ENCOUNTER — Other Ambulatory Visit: Payer: Self-pay | Admitting: Interventional Cardiology

## 2021-08-24 ENCOUNTER — Other Ambulatory Visit: Payer: Self-pay | Admitting: Interventional Cardiology

## 2021-08-26 DIAGNOSIS — E1165 Type 2 diabetes mellitus with hyperglycemia: Secondary | ICD-10-CM | POA: Diagnosis not present

## 2021-09-02 ENCOUNTER — Other Ambulatory Visit: Payer: Medicare PPO

## 2021-09-02 ENCOUNTER — Other Ambulatory Visit: Payer: Self-pay

## 2021-09-02 DIAGNOSIS — E7801 Familial hypercholesterolemia: Secondary | ICD-10-CM

## 2021-09-02 DIAGNOSIS — E7800 Pure hypercholesterolemia, unspecified: Secondary | ICD-10-CM | POA: Diagnosis not present

## 2021-09-02 DIAGNOSIS — E1122 Type 2 diabetes mellitus with diabetic chronic kidney disease: Secondary | ICD-10-CM | POA: Diagnosis not present

## 2021-09-02 DIAGNOSIS — I10 Essential (primary) hypertension: Secondary | ICD-10-CM | POA: Diagnosis not present

## 2021-09-02 DIAGNOSIS — E1151 Type 2 diabetes mellitus with diabetic peripheral angiopathy without gangrene: Secondary | ICD-10-CM | POA: Diagnosis not present

## 2021-09-02 DIAGNOSIS — I25709 Atherosclerosis of coronary artery bypass graft(s), unspecified, with unspecified angina pectoris: Secondary | ICD-10-CM | POA: Diagnosis not present

## 2021-09-02 DIAGNOSIS — I25810 Atherosclerosis of coronary artery bypass graft(s) without angina pectoris: Secondary | ICD-10-CM | POA: Diagnosis not present

## 2021-09-02 DIAGNOSIS — Z794 Long term (current) use of insulin: Secondary | ICD-10-CM | POA: Diagnosis not present

## 2021-09-02 DIAGNOSIS — I739 Peripheral vascular disease, unspecified: Secondary | ICD-10-CM | POA: Diagnosis not present

## 2021-09-02 DIAGNOSIS — N1831 Chronic kidney disease, stage 3a: Secondary | ICD-10-CM | POA: Diagnosis not present

## 2021-09-02 LAB — LIPID PANEL
Chol/HDL Ratio: 1.9 ratio (ref 0.0–4.4)
Cholesterol, Total: 110 mg/dL (ref 100–199)
HDL: 58 mg/dL (ref >39)
LDL Chol Calc (NIH): 34 mg/dL (ref 0–99)
Triglycerides: 92 mg/dL (ref 0–149)
VLDL Cholesterol Cal: 18 mg/dL (ref 5–40)

## 2021-09-02 LAB — HEPATIC FUNCTION PANEL
ALT: 23 IU/L (ref 0–32)
AST: 21 IU/L (ref 0–40)
Albumin: 4.4 g/dL (ref 3.7–4.7)
Alkaline Phosphatase: 64 IU/L (ref 44–121)
Bilirubin Total: 0.6 mg/dL (ref 0.0–1.2)
Bilirubin, Direct: 0.2 mg/dL (ref 0.00–0.40)
Total Protein: 7 g/dL (ref 6.0–8.5)

## 2021-09-03 ENCOUNTER — Other Ambulatory Visit: Payer: Medicare PPO

## 2021-09-06 DIAGNOSIS — B351 Tinea unguium: Secondary | ICD-10-CM | POA: Diagnosis not present

## 2021-09-06 DIAGNOSIS — E1151 Type 2 diabetes mellitus with diabetic peripheral angiopathy without gangrene: Secondary | ICD-10-CM | POA: Diagnosis not present

## 2021-09-06 DIAGNOSIS — L84 Corns and callosities: Secondary | ICD-10-CM | POA: Diagnosis not present

## 2021-09-06 DIAGNOSIS — M2041 Other hammer toe(s) (acquired), right foot: Secondary | ICD-10-CM | POA: Diagnosis not present

## 2021-11-25 DIAGNOSIS — B351 Tinea unguium: Secondary | ICD-10-CM | POA: Diagnosis not present

## 2021-11-25 DIAGNOSIS — M2041 Other hammer toe(s) (acquired), right foot: Secondary | ICD-10-CM | POA: Diagnosis not present

## 2021-11-25 DIAGNOSIS — L84 Corns and callosities: Secondary | ICD-10-CM | POA: Diagnosis not present

## 2021-11-25 DIAGNOSIS — E1151 Type 2 diabetes mellitus with diabetic peripheral angiopathy without gangrene: Secondary | ICD-10-CM | POA: Diagnosis not present

## 2021-12-02 ENCOUNTER — Other Ambulatory Visit: Payer: Self-pay | Admitting: Interventional Cardiology

## 2021-12-06 DIAGNOSIS — R0989 Other specified symptoms and signs involving the circulatory and respiratory systems: Secondary | ICD-10-CM | POA: Diagnosis not present

## 2021-12-06 DIAGNOSIS — E1122 Type 2 diabetes mellitus with diabetic chronic kidney disease: Secondary | ICD-10-CM | POA: Diagnosis not present

## 2021-12-06 DIAGNOSIS — N1832 Chronic kidney disease, stage 3b: Secondary | ICD-10-CM | POA: Diagnosis not present

## 2021-12-06 DIAGNOSIS — R252 Cramp and spasm: Secondary | ICD-10-CM | POA: Diagnosis not present

## 2021-12-10 ENCOUNTER — Other Ambulatory Visit: Payer: Self-pay | Admitting: Internal Medicine

## 2021-12-10 DIAGNOSIS — R0989 Other specified symptoms and signs involving the circulatory and respiratory systems: Secondary | ICD-10-CM

## 2021-12-24 ENCOUNTER — Ambulatory Visit
Admission: RE | Admit: 2021-12-24 | Discharge: 2021-12-24 | Disposition: A | Discharge status: Home / Self Care | Payer: Medicare PPO | Source: Ambulatory Visit | Attending: Internal Medicine | Admitting: Internal Medicine

## 2021-12-24 DIAGNOSIS — R0989 Other specified symptoms and signs involving the circulatory and respiratory systems: Secondary | ICD-10-CM | POA: Diagnosis not present

## 2021-12-31 DIAGNOSIS — N1832 Chronic kidney disease, stage 3b: Secondary | ICD-10-CM | POA: Diagnosis not present

## 2021-12-31 DIAGNOSIS — R7989 Other specified abnormal findings of blood chemistry: Secondary | ICD-10-CM | POA: Diagnosis not present

## 2022-01-31 NOTE — Progress Notes (Deleted)
[facility] [place]  ASSESSMENT / PLAN: Andrea [name] is a 76 y.o. female with atherosclerosis of *** native arteries of *** causing {Chronic PAD levels:25303}.  Patient counseled {pad risk2:26283}  WIfI score calculated based on clinical exam and non-invasive measurements. {WIFIvascular:26096}  Recommend the following which can slow the progression of atherosclerosis and reduce the risk of major adverse cardiac / limb events:  Complete cessation from all tobacco products. Blood glucose control with goal A1c < 7%. Blood pressure control with goal blood pressure < 140/90 mmHg. Lipid reduction therapy with goal LDL-C <100 mg/dL (<23 if symptomatic from PAD).  Aspirin 81mg  PO QD.  *** Clopidogrel 75mg  PO QD. *** Rivaroxaban 2.5mg  PO BID. *** Cilostozal 100mg  PO BID for intermittent claudication without evidence of heart failure. Atorvastatin 40-80mg  PO QD (or other "high intensity" statin therapy). *** Daily walking to and past the point of discomfort. Patient counseled to keep a log of exercise distance. *** Adequate hydration (at least 2 liters / day) if patient's heart and kidney function is adequate.  Plan *** lower extremity angiogram with possible intervention via *** approach in cath lab ***.    CHIEF COMPLAINT: ***  HISTORY OF PRESENT ILLNESS: Andrea [name] is a 76 y.o. female ***  VASCULAR SURGICAL HISTORY: ***  VASCULAR RISK FACTORS: {FINDINGS; POSITIVE NEGATIVE:8731933809} history of stroke / transient ischemic attack. {FINDINGS; POSITIVE NEGATIVE:8731933809} history of coronary artery disease. *** history of PCI. *** history of CABG.  {FINDINGS; POSITIVE NEGATIVE:8731933809} history of diabetes mellitus. Last A1c ***. {FINDINGS; POSITIVE NEGATIVE:8731933809} history of smoking. *** actively smoking. {FINDINGS; POSITIVE NEGATIVE:8731933809} history of hypertension. *** drug regimen with *** control. {FINDINGS; POSITIVE NEGATIVE:8731933809}  history of chronic kidney disease.  Last GFR ***. CKD {stage:30421363}. {FINDINGS; POSITIVE NEGATIVE:8731933809} history of chronic obstructive pulmonary disease, treated with ***.  FUNCTIONAL STATUS: ECOG performance status: {findings; ecog performance status:31780} Ambulatory status: {TNHAmbulation:25868}  Past Medical History:  Diagnosis Date   Anemia 04/30/2017   CHF (congestive heart failure) (HCC)    Diabetes mellitus    Hypertension    Hypokalemia 04/2017   Macular degeneration disease    loss of central vision on left and decreased peripheral vision.    Stroke (HCC) 12/2011   no residuals noted    Past Surgical History:  Procedure Laterality Date   CHOLECYSTECTOMY     CORONARY ARTERY BYPASS GRAFT N/A 04/18/2017   Procedure: CORONARY ARTERY BYPASS GRAFTING (CABG) times four using left internal mammary artery and right leg saphenous vein;  Surgeon: 05/2017, MD;  Location: [facility] OR;  Service: Open Heart Surgery;  Laterality: N/A;   EYE SURGERY     right   LEFT HEART CATH AND CORONARY ANGIOGRAPHY N/A 04/17/2017   Procedure: LEFT HEART CATH AND CORONARY ANGIOGRAPHY;  Surgeon: Kerin Perna, MD;  [facility] INVASIVE CV LAB;  Service: Cardiovascular;  Laterality: N/A;   PUBOVAGINAL SLING N/A 07/31/2019   Procedure: CYSTOSCOPY MID URETHRAL  SLING;  Surgeon: Marykay Lex, MD;  Location: [facility] ORS;  Service: Urology;  Laterality: N/A;   REFRACTIVE SURGERY Left    ROBOTIC ASSISTED LAPAROSCOPIC SACROCOLPOPEXY N/A 07/31/2019   Procedure: XI ROBOTIC ASSISTED LAPAROSCOPIC SACROCOLPOPEXY WIHT SUPRACERVICAL HYSTERECTOMY;  Surgeon: 08/02/2019, MD;  Location: [facility] ORS;  Service: Urology;  Laterality: N/A;   ROTATOR CUFF REPAIR     left   TEE WITHOUT CARDIOVERSION N/A 04/18/2017   Procedure: TRANSESOPHAGEAL ECHOCARDIOGRAM (TEE);  Surgeon: 08/02/2019, Crist Fat, MD;  Location: [facility] OR;  Service: Open  Heart Surgery;  Laterality: N/A;    Family History  Problem Relation Age of  Onset   Heart attack Father    Hypertension Father    Heart attack Brother    Hypertension Brother    Hypertension Mother    Stroke Mother    Healthy Brother     Social History   Socioeconomic History   Marital status: Married    Spouse name: Not on file   Number of children: 3   Years of education: 16   Highest education level: Not on file  Occupational History   Occupation: Professor    Associate Professor: [employer]  Tobacco Use   Smoking status: Former    Packs/day: 0.50    Years: 20.00    Total pack years: 10.00    Types: Cigarettes    Quit date: 01/02/1997    Years since quitting: 25.0   Smokeless tobacco: Never  Vaping Use   Vaping Use: Never used  Substance and Sexual Activity   Alcohol use: No   Drug use: No   Sexual activity: Not on file  Other Topics Concern   Not on file  Social History Narrative   Lives with husband and 3 children in a 2 story home.  Retired professor at [employer].  Education: college.    Social Determinants of Health   Financial Resource Strain: Not on file  Food Insecurity: Not on file  Transportation Needs: Not on file  Physical Activity: Not on file  Stress: Not on file  Social Connections: Not on file  Intimate Partner Violence: Not on file    No Known Allergies  Current Outpatient Medications  Medication Sig Dispense Refill   ALPRAZolam (XANAX) 0.25 MG tablet Take 1 tablet by mouth as needed.     aspirin EC 81 MG tablet Take 1 tablet (81 mg total) by mouth daily. 90 tablet 3   carvedilol (COREG) 6.25 MG tablet TAKE 1 TABLET BY MOUTH TWICE A DAY WITH MEALS 180 tablet 2   Cholecalciferol (VITAMIN D) 2000 UNITS tablet Take 2,000 Units by mouth daily.     COVID-19 mRNA bivalent vaccine, Pfizer, (PFIZER COVID-19 VAC BIVALENT) injection Inject into the muscle. 0.3 mL 0   COVID-19 mRNA Vac-TriS, Pfizer, (PFIZER-BIONT COVID-19 VAC-TRIS) SUSP injection Inject into the muscle. 0.3 mL 0   diphenhydramine-acetaminophen (TYLENOL PM) 25-500 MG  TABS tablet Take 1 tablet by mouth at bedtime as needed.     HUMALOG KWIKPEN 100 UNIT/ML KiwkPen Inject 2-15 Units into the skin 3 (three) times daily before meals. (Per sliding scale)  3   JANUMET XR 50-500 MG TB24 Take 1 tablet by mouth 2 (two) times daily.  6   MYRBETRIQ 25 MG TB24 tablet Take 25 mg by mouth daily.     NOVOFINE PLUS 32G X 4 MM MISC UP TO FOUR TIMES DAILY AS DIRECTED  5   Olmesartan-amLODIPine-HCTZ 40-5-25 MG TABS TAKE 1 TABLET BY MOUTH EVERY DAY 90 tablet 3   ONETOUCH VERIO test strip USE TO TEST BLOOD SUGAR 4 TIMES DAILY  12   rosuvastatin (CRESTOR) 10 MG tablet Take 1 tablet (10 mg total) by mouth daily. 90 tablet 3   TRESIBA FLEXTOUCH 100 UNIT/ML SOPN FlexTouch Pen Inject 26 Units into the skin every evening.  6   VASCEPA 1 g capsule TAKE 1 CAPSULE BY MOUTH TWICE A DAY 180 capsule 2   No current facility-administered medications for this visit.    PHYSICAL EXAM There were no vitals filed for  this visit.  Constitutional: *** appearing. *** distress. Appears *** nourished.  Neurologic: CN ***. *** focal findings. *** sensory loss. Psychiatric: *** Mood and affect symmetric and appropriate. Eyes: *** No icterus. No conjunctival pallor. Ears, nose, throat: *** mucous membranes moist. Midline trachea.  Cardiac: *** rate and rhythm.  Respiratory: *** unlabored. Abdominal: *** soft, non-tender, non-distended.  Peripheral vascular: *** Extremity: *** edema. *** cyanosis. *** pallor.  Skin: *** gangrene. *** ulceration.  Lymphatic: *** Stemmer's sign. *** palpable lymphadenopathy.    PERTINENT LABORATORY AND RADIOLOGIC DATA  Most recent CBC    Latest Ref Rng & Units 08/01/2019    4:41 AM 07/31/2019    1:15 PM 07/29/2019    1:22 PM  CBC  WBC 4.0 - 10.5 K/uL   7.0   Hemoglobin 12.0 - 15.0 g/dL 9.4  10.8  11.0   Hematocrit 36.0 - 46.0 % 28.3  32.8  33.1   Platelets 150 - 400 K/uL   245      Most recent CMP    Latest Ref Rng & Units 09/02/2021    8:46 AM  11/25/2019    1:44 PM 08/01/2019    4:41 AM  CMP  Glucose 65 - 99 mg/dL  283  194   BUN 8 - 27 mg/dL  25  26   Creatinine 0.57 - 1.00 mg/dL  1.21  1.66   Sodium 134 - 144 mmol/L  137  134   Potassium 3.5 - 5.2 mmol/L  4.3  4.1   Chloride 96 - 106 mmol/L  100  103   CO2 20 - 29 mmol/L  25  21   Calcium 8.7 - 10.3 mg/dL  9.2  8.2   Total Protein 6.0 - 8.5 g/dL 7.0     Total Bilirubin 0.0 - 1.2 mg/dL 0.6     Alkaline Phos 44 - 121 IU/L 64     AST 0 - 40 IU/L 21     ALT 0 - 32 IU/L 23       Renal function CrCl cannot be calculated (Patient's most recent lab result is older than the maximum 21 days allowed.).  Hgb A1c MFr Bld (%)  Date Value  07/29/2019 7.3 (H)    LDL Chol Calc (NIH)  Date Value Ref Range Status  09/02/2021 34 0 - 99 mg/dL Final     Vascular Imaging: ***  Boyd Buffalo N. Stanford Breed, MD [facility] Phone Number: [phone] 01/31/2022 5:01 PM  Total time spent on preparing this encounter including chart review, data review, collecting history, examining the patient, coordinating care for this {tnhtimebilling:26202}  Portions of this report may have been transcribed using voice recognition software.  Every effort has been made to ensure accuracy; however, inadvertent computerized transcription errors may still be present.

## 2022-02-01 ENCOUNTER — Encounter: Payer: Medicare PPO | Admitting: Vascular Surgery

## 2022-02-21 NOTE — Progress Notes (Unsigned)
[facility] [place]  ASSESSMENT / PLAN: Andrea [name] is a 76 y.o. female with atherosclerosis of native arteries of left lower extremity causing intermittent claudication.  Patient counseled patients with asymptomatic peripheral arterial disease or claudication have a 1-2% risk of developing chronic limb threatening ischemia, but a 15-30% risk of mortality in the next 5 years. Intervention should only be considered for medically optimized patients with disabling symptoms.   Recommend the following which can slow the progression of atherosclerosis and reduce the risk of major adverse cardiac / limb events:  Complete cessation from all tobacco products. Blood glucose control with goal A1c < 7%. Blood pressure control with goal blood pressure < 140/90 mmHg. Lipid reduction therapy with goal LDL-C <100 mg/dL (<16 if symptomatic from PAD).  Aspirin 81mg  PO QD.  Atorvastatin 40-80mg  PO QD (or other "high intensity" statin therapy). Daily walking to and past the point of discomfort. Patient counseled to keep a log of exercise distance. Adequate hydration (at least 2 liters / day) if patient's heart and kidney function is adequate.  With me in a year with repeat ABI.  CHIEF COMPLAINT: Leg pain with walking  HISTORY OF PRESENT ILLNESS: Andrea [name] is a 76 y.o. female referred to clinic for evaluation of peripheral arterial disease.  The patient reports cramping discomfort in her left calf with walking.  This is not always reproducible.  She does avoid long walks where she cannot rest.  She is reduced her walking speed because of discomfort.  She does not have any ischemic rest pain.  She has no ulcers about her feet.  VASCULAR SURGICAL HISTORY: none  VASCULAR RISK FACTORS: Positive history of stroke / transient ischemic attack. Negative history of coronary artery disease.  Positive history of diabetes mellitus. Last A1c 7.3. Positive history of smoking. Not  actively smoking. Positive history of hypertension.  Negative history of chronic kidney disease. Negative history of chronic obstructive pulmonary disease.  FUNCTIONAL STATUS: ECOG performance status: (2) Ambulatory and capable of self care, unable to carry out work activity, up and about > 50% or waking hours Ambulatory status: Ambulatory within the community with limits  Past Medical History:  Diagnosis Date   Anemia 04/30/2017   CHF (congestive heart failure) (HCC)    Diabetes mellitus    Hypertension    Hypokalemia 04/2017   Macular degeneration disease    loss of central vision on left and decreased peripheral vision.    Stroke (HCC) 12/2011   no residuals noted    Past Surgical History:  Procedure Laterality Date   CHOLECYSTECTOMY     CORONARY ARTERY BYPASS GRAFT N/A 04/18/2017   Procedure: CORONARY ARTERY BYPASS GRAFTING (CABG) times four using left internal mammary artery and right leg saphenous vein;  Surgeon: 06/18/2017, MD;  Location: [facility] OR;  Service: Open Heart Surgery;  Laterality: N/A;   EYE SURGERY     right   LEFT HEART CATH AND CORONARY ANGIOGRAPHY N/A 04/17/2017   Procedure: LEFT HEART CATH AND CORONARY ANGIOGRAPHY;  Surgeon: 06/17/2017, MD;  Location: [facility] INVASIVE CV LAB;  Service: Cardiovascular;  Laterality: N/A;   PUBOVAGINAL SLING N/A 07/31/2019   Procedure: CYSTOSCOPY MID URETHRAL  SLING;  Surgeon: 08/02/2019, MD;  Location: [facility] ORS;  Service: Urology;  Laterality: N/A;   REFRACTIVE SURGERY Left    ROBOTIC ASSISTED LAPAROSCOPIC SACROCOLPOPEXY N/A 07/31/2019   Procedure: XI ROBOTIC ASSISTED LAPAROSCOPIC SACROCOLPOPEXY WIHT SUPRACERVICAL HYSTERECTOMY;  Surgeon: 08/02/2019, MD;  Location: [facility] ORS;  Service: Urology;  Laterality: N/A;   ROTATOR CUFF REPAIR     left   TEE WITHOUT CARDIOVERSION N/A 04/18/2017   Procedure: TRANSESOPHAGEAL ECHOCARDIOGRAM (TEE);  Surgeon: Donata Clay, Theron Arista, MD;  Location: [facility] OR;  Service: Open Heart  Surgery;  Laterality: N/A;    Family History  Problem Relation Age of Onset   Heart attack Father    Hypertension Father    Heart attack Brother    Hypertension Brother    Hypertension Mother    Stroke Mother    Healthy Brother     Social History   Socioeconomic History   Marital status: Married    Spouse name: Not on file   Number of children: 3   Years of education: 16   Highest education level: Not on file  Occupational History   Occupation: Professor    Associate Professor: [employer]  Tobacco Use   Smoking status: Former    Packs/day: 0.50    Years: 20.00    Total pack years: 10.00    Types: Cigarettes    Quit date: 01/02/1997    Years since quitting: 25.1   Smokeless tobacco: Never  Vaping Use   Vaping Use: Never used  Substance and Sexual Activity   Alcohol use: No   Drug use: No   Sexual activity: Not on file  Other Topics Concern   Not on file  Social History Narrative   Lives with husband and 3 children in a 2 story home.  Retired professor at [employer].  Education: college.    Social Determinants of Health   Financial Resource Strain: Not on file  Food Insecurity: Not on file  Transportation Needs: Not on file  Physical Activity: Not on file  Stress: Not on file  Social Connections: Not on file  Intimate Partner Violence: Not on file    No Known Allergies  Current Outpatient Medications  Medication Sig Dispense Refill   ALPRAZolam (XANAX) 0.25 MG tablet Take 1 tablet by mouth as needed.     aspirin EC 81 MG tablet Take 1 tablet (81 mg total) by mouth daily. 90 tablet 3   carvedilol (COREG) 6.25 MG tablet TAKE 1 TABLET BY MOUTH TWICE A DAY WITH MEALS 180 tablet 2   Cholecalciferol (VITAMIN D) 2000 UNITS tablet Take 2,000 Units by mouth daily.     COVID-19 mRNA bivalent vaccine, Pfizer, (PFIZER COVID-19 VAC BIVALENT) injection Inject into the muscle. 0.3 mL 0   COVID-19 mRNA Vac-TriS, Pfizer, (PFIZER-BIONT COVID-19 VAC-TRIS) SUSP injection Inject into the  muscle. 0.3 mL 0   diphenhydramine-acetaminophen (TYLENOL PM) 25-500 MG TABS tablet Take 1 tablet by mouth at bedtime as needed.     HUMALOG KWIKPEN 100 UNIT/ML KiwkPen Inject 2-15 Units into the skin 3 (three) times daily before meals. (Per sliding scale)  3   JANUMET XR 50-500 MG TB24 Take 1 tablet by mouth 2 (two) times daily.  6   MYRBETRIQ 25 MG TB24 tablet Take 25 mg by mouth daily.     NOVOFINE PLUS 32G X 4 MM MISC UP TO FOUR TIMES DAILY AS DIRECTED  5   Olmesartan-amLODIPine-HCTZ 40-5-25 MG TABS TAKE 1 TABLET BY MOUTH EVERY DAY 90 tablet 3   ONETOUCH VERIO test strip USE TO TEST BLOOD SUGAR 4 TIMES DAILY  12   rosuvastatin (CRESTOR) 10 MG tablet Take 1 tablet (10 mg total) by mouth daily. 90 tablet 3   TRESIBA FLEXTOUCH 100 UNIT/ML SOPN FlexTouch Pen Inject 26  Units into the skin every evening.  6   VASCEPA 1 g capsule TAKE 1 CAPSULE BY MOUTH TWICE A DAY 180 capsule 2   No current facility-administered medications for this visit.    PHYSICAL EXAM Vitals:   02/22/22 0928  BP: (!) 146/72  Pulse: (!) 55  Resp: 20  Temp: 98.1 F (36.7 C)  SpO2: 96%  Weight: 166 lb (75.3 kg)  Height: 4\' 11"  (1.499 m)   Well-appearing woman in no acute distress Regular rate and rhythm Unlabored breathing No palpable pedal pulses bilaterally No ulcers about the foot, toes, or interdigital spaces bilaterally  PERTINENT LABORATORY AND RADIOLOGIC DATA  Most recent CBC    Latest Ref Rng & Units 08/01/2019    4:41 AM 07/31/2019    1:15 PM 07/29/2019    1:22 PM  CBC  WBC 4.0 - 10.5 K/uL   7.0   Hemoglobin 12.0 - 15.0 g/dL 9.4  10.8  11.0   Hematocrit 36.0 - 46.0 % 28.3  32.8  33.1   Platelets 150 - 400 K/uL   245      Most recent CMP    Latest Ref Rng & Units 09/02/2021    8:46 AM 11/25/2019    1:44 PM 08/01/2019    4:41 AM  CMP  Glucose 65 - 99 mg/dL  283  194   BUN 8 - 27 mg/dL  25  26   Creatinine 0.57 - 1.00 mg/dL  1.21  1.66   Sodium 134 - 144 mmol/L  137  134   Potassium 3.5  - 5.2 mmol/L  4.3  4.1   Chloride 96 - 106 mmol/L  100  103   CO2 20 - 29 mmol/L  25  21   Calcium 8.7 - 10.3 mg/dL  9.2  8.2   Total Protein 6.0 - 8.5 g/dL 7.0     Total Bilirubin 0.0 - 1.2 mg/dL 0.6     Alkaline Phos 44 - 121 IU/L 64     AST 0 - 40 IU/L 21     ALT 0 - 32 IU/L 23       Renal function CrCl cannot be calculated (Patient's most recent lab result is older than the maximum 21 days allowed.).  Hgb A1c MFr Bld (%)  Date Value  07/29/2019 7.3 (H)    LDL Chol Calc (NIH)  Date Value Ref Range Status  09/02/2021 34 0 - 99 mg/dL Final     Vascular Imaging: ABI pre/post exercise:  Right:  Resting ankle brachial index:  0.90   Post exercise ABI: 0.48   Left:  Resting ankle brachial index: 1.11   Post exercise ABI: 0.99  Irene Collings N. Stanford Breed, MD [facility] Phone Number: [phone] 02/21/2022 2:20 PM  Total time spent on preparing this encounter including chart review, data review, collecting history, examining the patient, coordinating care for this new patient, 60 minutes.  Portions of this report may have been transcribed using voice recognition software.  Every effort has been made to ensure accuracy; however, inadvertent computerized transcription errors may still be present.

## 2022-02-22 ENCOUNTER — Encounter: Payer: Self-pay | Admitting: Vascular Surgery

## 2022-02-22 ENCOUNTER — Ambulatory Visit: Payer: Medicare PPO | Admitting: Vascular Surgery

## 2022-02-22 VITALS — BP 146/72 | HR 55 | Temp 98.1°F | Resp 20 | Ht 59.0 in | Wt 166.0 lb

## 2022-02-22 DIAGNOSIS — I739 Peripheral vascular disease, unspecified: Secondary | ICD-10-CM | POA: Diagnosis not present

## 2022-02-24 DIAGNOSIS — L84 Corns and callosities: Secondary | ICD-10-CM | POA: Diagnosis not present

## 2022-02-24 DIAGNOSIS — E1151 Type 2 diabetes mellitus with diabetic peripheral angiopathy without gangrene: Secondary | ICD-10-CM | POA: Diagnosis not present

## 2022-02-24 DIAGNOSIS — B351 Tinea unguium: Secondary | ICD-10-CM | POA: Diagnosis not present

## 2022-02-24 DIAGNOSIS — M2041 Other hammer toe(s) (acquired), right foot: Secondary | ICD-10-CM | POA: Diagnosis not present

## 2022-03-07 ENCOUNTER — Telehealth: Payer: Self-pay

## 2022-03-07 NOTE — Telephone Encounter (Signed)
Pt called stating that she was interested in starting a water aerobics class and wanted to make sure of any precautions.   Reviewed pt's chart, returned pt's call, two identifiers used. Encouraged pt to start class and keep log of exercise as requested by Dr Lenell Antu. Answered questions, confirmed understanding.

## 2022-03-15 DIAGNOSIS — I739 Peripheral vascular disease, unspecified: Secondary | ICD-10-CM | POA: Diagnosis not present

## 2022-03-15 DIAGNOSIS — E1122 Type 2 diabetes mellitus with diabetic chronic kidney disease: Secondary | ICD-10-CM | POA: Diagnosis not present

## 2022-03-15 DIAGNOSIS — M2170 Unequal limb length (acquired), unspecified site: Secondary | ICD-10-CM | POA: Diagnosis not present

## 2022-03-15 DIAGNOSIS — Z0000 Encounter for general adult medical examination without abnormal findings: Secondary | ICD-10-CM | POA: Diagnosis not present

## 2022-03-15 DIAGNOSIS — N1832 Chronic kidney disease, stage 3b: Secondary | ICD-10-CM | POA: Diagnosis not present

## 2022-03-15 DIAGNOSIS — Z794 Long term (current) use of insulin: Secondary | ICD-10-CM | POA: Diagnosis not present

## 2022-03-15 DIAGNOSIS — E7800 Pure hypercholesterolemia, unspecified: Secondary | ICD-10-CM | POA: Diagnosis not present

## 2022-03-15 DIAGNOSIS — I10 Essential (primary) hypertension: Secondary | ICD-10-CM | POA: Diagnosis not present

## 2022-03-15 DIAGNOSIS — I25810 Atherosclerosis of coronary artery bypass graft(s) without angina pectoris: Secondary | ICD-10-CM | POA: Diagnosis not present

## 2022-03-15 DIAGNOSIS — Z1331 Encounter for screening for depression: Secondary | ICD-10-CM | POA: Diagnosis not present

## 2022-03-15 DIAGNOSIS — K5900 Constipation, unspecified: Secondary | ICD-10-CM | POA: Diagnosis not present

## 2022-05-12 DIAGNOSIS — L84 Corns and callosities: Secondary | ICD-10-CM | POA: Diagnosis not present

## 2022-05-12 DIAGNOSIS — E1151 Type 2 diabetes mellitus with diabetic peripheral angiopathy without gangrene: Secondary | ICD-10-CM | POA: Diagnosis not present

## 2022-05-12 DIAGNOSIS — B351 Tinea unguium: Secondary | ICD-10-CM | POA: Diagnosis not present

## 2022-05-12 DIAGNOSIS — M2041 Other hammer toe(s) (acquired), right foot: Secondary | ICD-10-CM | POA: Diagnosis not present

## 2022-06-08 ENCOUNTER — Other Ambulatory Visit: Payer: Self-pay | Admitting: Interventional Cardiology

## 2022-07-07 NOTE — Progress Notes (Signed)
Office Visit    Patient Name: Andrea [name] Date of Encounter: 07/08/2022  Primary Care Provider:  Renford Dills, MD Primary Cardiologist:  None Primary Electrophysiologist: None  Chief Complaint    Andrea [name] is a 76 y.o. female with PMH of CAD s/p CABG 2018 following NSTEMI, DM type II, HTN, HLD, PAD with intermittent claudication, CVA (microvascular stroke 2013), anemia who presents today for 1 year follow-up.  Past Medical History    Past Medical History:  Diagnosis Date   Anemia 04/30/2017   CHF (congestive heart failure) (HCC)    Diabetes mellitus    Hypertension    Hypokalemia 04/2017   Macular degeneration disease    loss of central vision on left and decreased peripheral vision.    Stroke (HCC) 12/2011   no residuals noted   Past Surgical History:  Procedure Laterality Date   CHOLECYSTECTOMY     CORONARY ARTERY BYPASS GRAFT N/A 04/18/2017   Procedure: CORONARY ARTERY BYPASS GRAFTING (CABG) times four using left internal mammary artery and right leg saphenous vein;  Surgeon: Kerin Perna, MD;  Location: [facility] OR;  Service: Open Heart Surgery;  Laterality: N/A;   EYE SURGERY     right   LEFT HEART CATH AND CORONARY ANGIOGRAPHY N/A 04/17/2017   Procedure: LEFT HEART CATH AND CORONARY ANGIOGRAPHY;  Surgeon: Marykay Lex, MD;  Location: [facility] INVASIVE CV LAB;  Service: Cardiovascular;  Laterality: N/A;   PUBOVAGINAL SLING N/A 07/31/2019   Procedure: CYSTOSCOPY MID URETHRAL  SLING;  Surgeon: Crist Fat, MD;  Location: [facility] ORS;  Service: Urology;  Laterality: N/A;   REFRACTIVE SURGERY Left    ROBOTIC ASSISTED LAPAROSCOPIC SACROCOLPOPEXY N/A 07/31/2019   Procedure: XI ROBOTIC ASSISTED LAPAROSCOPIC SACROCOLPOPEXY WIHT SUPRACERVICAL HYSTERECTOMY;  Surgeon: Crist Fat, MD;  Location: [facility] ORS;  Service: Urology;  Laterality: N/A;   ROTATOR CUFF REPAIR     left   TEE WITHOUT CARDIOVERSION N/A 04/18/2017   Procedure: TRANSESOPHAGEAL ECHOCARDIOGRAM  (TEE);  Surgeon: Donata Clay, Theron Arista, MD;  Location: [facility] OR;  Service: Open Heart Surgery;  Laterality: N/A;    Allergies  No Known Allergies  History of Present Illness    Andrea [name]  is a 76 year old female with the above mention past medical history who presents today for 1 year follow-up of coronary artery disease.  Andrea [name] was originally seen by Dr. Katrinka Blazing in 2017 for evaluation of chest pressure.  She had endorsed chest pressure with moderate and physical activity over the past 6 to 8 months.  She had a known history of diabetes since 1993 and prior microvascular stroke with acute on chronic right cerebral white matter infarct and left facial droop in 2013.  She was sent for [facility] that was low risk with no ischemia.  She had an up titration of beta-blocker therapy that had improved her anginal symptoms.  She was also given Imdur 30 mg daily.  She presented to the ED 04/2017 with complaint of chest pain and unstable angina with minimal bump in initial troponin.  EKG showed sinus rhythm with LVH. Echocardiogram showed LVH with normal LV systolic function, no significant valvular disease.  She underwent LHC that revealed severe three-vessel CAD and underwent CABG x 4 by Dr. Maren Beach.  During postprocedure follow-up patient was doing well and was seen by Chelsea Aus, PA on 05/2018 for complaint of lower extremity edema and shortness of breath.  Her amlodipine was reduced to 5 mg and blood  pressure was well-controlled.  She was most recently seen by Dr. Katrinka Blazing 06/2021 and was doing well with resolution of arm and chest discomfort that were residuals from her bypass surgery.  She had no evidence of volume overload but A1c was still elevated at 10 and recommendation to start SGLT2 inhibitor was made.  She was also advised to continue secondary prevention.  She is currently being treated by [facility] for intermittent claudication and abnormal ABIs.  Andrea [name] presents today for 1 year follow-up with  her husband.  Since last being seen in the office patient reports she has been doing well with no new cardiac complaints or concerns at this time.  She is compliant with her current medications and denies any adverse reactions or side effects.  Her blood pressure today was well-controlled at 114/62 with rate of 58 bpm.  She is currently euvolemic on examination and denies any excess salt in her diet.  During her visit we discussed the importance of primary prevention and secondary prevention and reducing progression of cardiovascular disease.  She reports occasional cramping and claudication-like pain with ambulation that resolves with rest.  She is currently participating in a walking program for assistance in managing claudication.  Patient denies chest pain, palpitations, dyspnea, PND, orthopnea, nausea, vomiting, dizziness, syncope, edema, weight gain, or early satiety.  Home Medications    Current Outpatient Medications  Medication Sig Dispense Refill   ALPRAZolam (XANAX) 0.25 MG tablet Take 1 tablet by mouth as needed.     aspirin EC 81 MG tablet Take 1 tablet (81 mg total) by mouth daily. 90 tablet 3   carvedilol (COREG) 6.25 MG tablet TAKE 1 TABLET BY MOUTH TWICE A DAY WITH MEALS 180 tablet 2   Cholecalciferol (VITAMIN D) 2000 UNITS tablet Take 2,000 Units by mouth daily.     COVID-19 mRNA bivalent vaccine, Pfizer, (PFIZER COVID-19 VAC BIVALENT) injection Inject into the muscle. 0.3 mL 0   COVID-19 mRNA Vac-TriS, Pfizer, (PFIZER-BIONT COVID-19 VAC-TRIS) SUSP injection Inject into the muscle. 0.3 mL 0   diphenhydramine-acetaminophen (TYLENOL PM) 25-500 MG TABS tablet Take 1 tablet by mouth at bedtime as needed.     FARXIGA 5 MG TABS tablet Take 5 mg by mouth daily.     MYRBETRIQ 25 MG TB24 tablet Take 25 mg by mouth daily.     NOVOFINE PLUS 32G X 4 MM MISC UP TO FOUR TIMES DAILY AS DIRECTED  5   NOVOLOG FLEXPEN 100 UNIT/ML FlexPen SMARTSIG:12-20 Unit(s) SUB-Q 3 Times Daily      Olmesartan-amLODIPine-HCTZ 40-5-25 MG TABS TAKE 1 TABLET BY MOUTH EVERY DAY 90 tablet 3   ONETOUCH VERIO test strip USE TO TEST BLOOD SUGAR 4 TIMES DAILY  12   OZEMPIC, 0.25 OR 0.5 MG/DOSE, 2 MG/3ML SOPN Inject 0.5 mg into the skin once a week.     rosuvastatin (CRESTOR) 10 MG tablet TAKE 1 TABLET BY MOUTH EVERY DAY 30 tablet 0   TRESIBA FLEXTOUCH 100 UNIT/ML SOPN FlexTouch Pen Inject 26 Units into the skin every evening.  6   VASCEPA 1 g capsule TAKE 1 CAPSULE BY MOUTH TWICE A DAY 30 capsule 0   No current facility-administered medications for this visit.     Review of Systems  Please see the history of present illness.    (+) Lower extremity cramping (+) Insomnia  All other systems reviewed and are otherwise negative except as noted above.  Physical Exam    Wt Readings from Last 3 Encounters:  07/08/22 165  lb (74.8 kg)  02/22/22 166 lb (75.3 kg)  07/06/21 170 lb 4 oz (77.2 kg)   VS: Vitals:   07/08/22 1522  BP: 114/62  Pulse: (!) 58  ,Body mass index is 33.33 kg/m.  Constitutional:      Appearance: Healthy appearance. Not in distress.  Neck:     Vascular: JVD normal.  Pulmonary:     Effort: Pulmonary effort is normal.     Breath sounds: No wheezing. No rales. Diminished in the bases Cardiovascular:     Normal rate. Regular rhythm. Normal S1. Normal S2.      Murmurs: There is no murmur.  Edema:    Peripheral edema absent.  Abdominal:     Palpations: Abdomen is soft non tender. There is no hepatomegaly.  Skin:    General: Skin is warm and dry.  Neurological:     General: No focal deficit present.     Mental Status: Alert and oriented to person, place and time.     Cranial Nerves: Cranial nerves are intact.  EKG/LABS/Other Studies Reviewed    ECG personally reviewed by me today -sinus rhythm with PVCs and possible LVH with a rate of 68 bpm and no acute changes consistent with previous EKG.   Lab Results  Component Value Date   WBC 7.0 07/29/2019   HGB 9.4  (L) 08/01/2019   HCT 28.3 (L) 08/01/2019   MCV 86.2 07/29/2019   PLT 245 07/29/2019   Lab Results  Component Value Date   CREATININE 1.21 (H) 11/25/2019   BUN 25 11/25/2019   NA 137 11/25/2019   K 4.3 11/25/2019   CL 100 11/25/2019   CO2 25 11/25/2019   Lab Results  Component Value Date   ALT 23 09/02/2021   AST 21 09/02/2021   ALKPHOS 64 09/02/2021   BILITOT 0.6 09/02/2021   Lab Results  Component Value Date   CHOL 110 09/02/2021   HDL 58 09/02/2021   LDLCALC 34 09/02/2021   TRIG 92 09/02/2021   CHOLHDL 1.9 09/02/2021    Lab Results  Component Value Date   HGBA1C 7.3 (H) 07/29/2019    Assessment & Plan    1.  Coronary artery disease: -s/p NSTEMI and CABG x 4 in 2018 -Today patient reports no chest pain or anginal equivalent -Continue GDMT with ASA 81 mg, carvedilol 6.25 mg twice daily, Crestor 10 mg daily -Primary and secondary prevention were discussed and patient encouraged to increase physical activity to 150 minutes/week   2.  History of CVA: -prior microvascular stroke with acute on chronic right cerebral white matter infarct and left facial droop in 2013 -Continue GDMT with ASA 81 mg and Crestor as noted above  3.  Peripheral artery disease -Patient currently followed by [facility] for intermittent claudication with left ABI of 1.11 and right ABI 0.900 -Continue current GDMT as noted above and continue walking program  4.  Essential hypertension -Patient's blood pressure today was well-controlled at 114/62 -Continue carvedilol 6.25 mg twice daily and olmesartan-amlodipine-HCTZ 40 - 5 - 25 daily  5.  Hyperlipidemia: -Patient's last LDL was 34 at goal  -Continue Vascepa and Crestor as noted above  Disposition: Follow-up with None or APP in 12 months   Medication Adjustments/Labs and Tests Ordered: Current medicines are reviewed at length with the patient today.  Concerns regarding medicines are outlined above.   Signed, Napoleon Form, Leodis Rains,  NP 07/08/2022, 4:37 PM [facility]  Note:  This document was prepared  using Systems analyst and may include unintentional dictation errors.

## 2022-07-08 ENCOUNTER — Encounter: Payer: Self-pay | Admitting: Nurse Practitioner

## 2022-07-08 ENCOUNTER — Ambulatory Visit: Payer: Medicare PPO | Attending: Nurse Practitioner | Admitting: Nurse Practitioner

## 2022-07-08 VITALS — BP 114/62 | HR 58 | Ht 59.0 in | Wt 165.0 lb

## 2022-07-08 DIAGNOSIS — I739 Peripheral vascular disease, unspecified: Secondary | ICD-10-CM

## 2022-07-08 DIAGNOSIS — I25709 Atherosclerosis of coronary artery bypass graft(s), unspecified, with unspecified angina pectoris: Secondary | ICD-10-CM

## 2022-07-08 DIAGNOSIS — I6389 Other cerebral infarction: Secondary | ICD-10-CM

## 2022-07-08 DIAGNOSIS — I10 Essential (primary) hypertension: Secondary | ICD-10-CM

## 2022-07-08 DIAGNOSIS — E785 Hyperlipidemia, unspecified: Secondary | ICD-10-CM | POA: Diagnosis not present

## 2022-07-08 NOTE — Patient Instructions (Signed)
Medication Instructions:  Your physician recommends that you continue on your current medications as directed. Please refer to the Current Medication list given to you today. *If you need a refill on your cardiac medications before your next appointment, please call your pharmacy*   Lab Work: TODAY-CBC, CMET, & MAG If you have labs (blood work) drawn today and your tests are completely normal, you will receive your results only by: MyChart Message (if you have MyChart) OR A paper copy in the mail If you have any lab test that is abnormal or we need to change your treatment, we will call you to review the results.   Testing/Procedures: NONE ORDERED   Follow-Up: At [facility], you and your health needs are our priority.  As part of our continuing mission to provide you with exceptional heart care, we have created designated Provider Care Teams.  These Care Teams include your primary Cardiologist (physician) and Advanced Practice Providers (APPs -  Physician Assistants and Nurse Practitioners) who all work together to provide you with the care you need, when you need it.  We recommend signing up for the patient portal called "MyChart".  Sign up information is provided on this After Visit Summary.  MyChart is used to connect with patients for Virtual Visits (Telemedicine).  Patients are able to view lab/test results, encounter notes, upcoming appointments, etc.  Non-urgent messages can be sent to your provider as well.   To learn more about what you can do with MyChart, go to [email or web address].    Your next appointment:   12 month(s)  The format for your next appointment:   In Person  Provider:   Thomasene Ripple, MD  Other Instructions   Important Information About Sugar

## 2022-07-09 LAB — COMPREHENSIVE METABOLIC PANEL WITH GFR
ALT: 29 IU/L (ref 0–32)
AST: 22 IU/L (ref 0–40)
Albumin/Globulin Ratio: 1.7 (ref 1.2–2.2)
Albumin: 4.3 g/dL (ref 3.8–4.8)
Alkaline Phosphatase: 69 IU/L (ref 44–121)
BUN/Creatinine Ratio: 17 (ref 12–28)
BUN: 25 mg/dL (ref 8–27)
Bilirubin Total: 0.8 mg/dL (ref 0.0–1.2)
CO2: 27 mmol/L (ref 20–29)
Calcium: 9.2 mg/dL (ref 8.7–10.3)
Chloride: 104 mmol/L (ref 96–106)
Creatinine, Ser: 1.48 mg/dL — ABNORMAL HIGH (ref 0.57–1.00)
Globulin, Total: 2.6 g/dL (ref 1.5–4.5)
Glucose: 141 mg/dL — ABNORMAL HIGH (ref 70–99)
Potassium: 3.9 mmol/L (ref 3.5–5.2)
Sodium: 145 mmol/L — ABNORMAL HIGH (ref 134–144)
Total Protein: 6.9 g/dL (ref 6.0–8.5)
eGFR: 36 mL/min/1.73 — ABNORMAL LOW

## 2022-07-09 LAB — CBC
Hematocrit: 37 % (ref 34.0–46.6)
Hemoglobin: 12.2 g/dL (ref 11.1–15.9)
MCH: 27.1 pg (ref 26.6–33.0)
MCHC: 33 g/dL (ref 31.5–35.7)
MCV: 82 fL (ref 79–97)
Platelets: 214 x10E3/uL (ref 150–450)
RBC: 4.5 x10E6/uL (ref 3.77–5.28)
RDW: 14.1 % (ref 11.7–15.4)
WBC: 7.1 x10E3/uL (ref 3.4–10.8)

## 2022-07-09 LAB — MAGNESIUM: Magnesium: 2.2 mg/dL (ref 1.6–2.3)

## 2022-07-13 ENCOUNTER — Other Ambulatory Visit: Payer: Self-pay | Admitting: Interventional Cardiology

## 2022-07-19 DIAGNOSIS — E1151 Type 2 diabetes mellitus with diabetic peripheral angiopathy without gangrene: Secondary | ICD-10-CM | POA: Diagnosis not present

## 2022-07-19 DIAGNOSIS — M2041 Other hammer toe(s) (acquired), right foot: Secondary | ICD-10-CM | POA: Diagnosis not present

## 2022-07-19 DIAGNOSIS — B351 Tinea unguium: Secondary | ICD-10-CM | POA: Diagnosis not present

## 2022-07-19 DIAGNOSIS — L84 Corns and callosities: Secondary | ICD-10-CM | POA: Diagnosis not present

## 2022-08-17 DIAGNOSIS — E7800 Pure hypercholesterolemia, unspecified: Secondary | ICD-10-CM | POA: Diagnosis not present

## 2022-08-17 DIAGNOSIS — Z794 Long term (current) use of insulin: Secondary | ICD-10-CM | POA: Diagnosis not present

## 2022-08-17 DIAGNOSIS — I739 Peripheral vascular disease, unspecified: Secondary | ICD-10-CM | POA: Diagnosis not present

## 2022-08-17 DIAGNOSIS — I10 Essential (primary) hypertension: Secondary | ICD-10-CM | POA: Diagnosis not present

## 2022-08-17 DIAGNOSIS — E1122 Type 2 diabetes mellitus with diabetic chronic kidney disease: Secondary | ICD-10-CM | POA: Diagnosis not present

## 2022-08-17 DIAGNOSIS — I25810 Atherosclerosis of coronary artery bypass graft(s) without angina pectoris: Secondary | ICD-10-CM | POA: Diagnosis not present

## 2022-08-17 DIAGNOSIS — E1151 Type 2 diabetes mellitus with diabetic peripheral angiopathy without gangrene: Secondary | ICD-10-CM | POA: Diagnosis not present

## 2022-08-17 DIAGNOSIS — N1832 Chronic kidney disease, stage 3b: Secondary | ICD-10-CM | POA: Diagnosis not present

## 2022-09-20 DIAGNOSIS — L84 Corns and callosities: Secondary | ICD-10-CM | POA: Diagnosis not present

## 2022-09-20 DIAGNOSIS — B351 Tinea unguium: Secondary | ICD-10-CM | POA: Diagnosis not present

## 2022-09-20 DIAGNOSIS — E1151 Type 2 diabetes mellitus with diabetic peripheral angiopathy without gangrene: Secondary | ICD-10-CM | POA: Diagnosis not present

## 2022-09-20 DIAGNOSIS — M2041 Other hammer toe(s) (acquired), right foot: Secondary | ICD-10-CM | POA: Diagnosis not present

## 2022-10-13 ENCOUNTER — Other Ambulatory Visit: Payer: Self-pay | Admitting: *Deleted

## 2022-10-13 MED ORDER — CARVEDILOL 6.25 MG PO TABS: 6.2500 mg | ORAL_TABLET | Freq: Two times a day (BID) | ORAL | 3 refills | Status: DC

## 2022-11-30 DIAGNOSIS — M79675 Pain in left toe(s): Secondary | ICD-10-CM | POA: Diagnosis not present

## 2022-11-30 DIAGNOSIS — B351 Tinea unguium: Secondary | ICD-10-CM | POA: Diagnosis not present

## 2022-11-30 DIAGNOSIS — M2041 Other hammer toe(s) (acquired), right foot: Secondary | ICD-10-CM | POA: Diagnosis not present

## 2022-11-30 DIAGNOSIS — E1151 Type 2 diabetes mellitus with diabetic peripheral angiopathy without gangrene: Secondary | ICD-10-CM | POA: Diagnosis not present

## 2022-11-30 DIAGNOSIS — L84 Corns and callosities: Secondary | ICD-10-CM | POA: Diagnosis not present

## 2022-12-19 ENCOUNTER — Other Ambulatory Visit: Payer: Self-pay | Admitting: *Deleted

## 2022-12-19 MED ORDER — ICOSAPENT ETHYL 1 G PO CAPS: 1.0000 g | ORAL_CAPSULE | Freq: Two times a day (BID) | ORAL | 2 refills | Status: DC

## 2023-02-13 ENCOUNTER — Other Ambulatory Visit: Payer: Self-pay | Admitting: *Deleted

## 2023-02-13 DIAGNOSIS — M2041 Other hammer toe(s) (acquired), right foot: Secondary | ICD-10-CM | POA: Diagnosis not present

## 2023-02-13 DIAGNOSIS — M79675 Pain in left toe(s): Secondary | ICD-10-CM | POA: Diagnosis not present

## 2023-02-13 DIAGNOSIS — E1151 Type 2 diabetes mellitus with diabetic peripheral angiopathy without gangrene: Secondary | ICD-10-CM | POA: Diagnosis not present

## 2023-02-13 DIAGNOSIS — L84 Corns and callosities: Secondary | ICD-10-CM | POA: Diagnosis not present

## 2023-02-13 DIAGNOSIS — I739 Peripheral vascular disease, unspecified: Secondary | ICD-10-CM

## 2023-02-13 DIAGNOSIS — B351 Tinea unguium: Secondary | ICD-10-CM | POA: Diagnosis not present

## 2023-02-14 ENCOUNTER — Telehealth: Payer: Self-pay

## 2023-02-14 NOTE — Telephone Encounter (Signed)
Pt called asking if she needed blood work done prior to her upcoming office visit.  Reviewed pt's chart, returned call for clarification, two identifiers used. Informed her that she did not need labs. Reviewed her appt date, times, and what was being done. Confirmed understanding.

## 2023-02-27 NOTE — Progress Notes (Unsigned)
[facility] [place]  ASSESSMENT / PLAN: Andrea [name] is a 77 y.o. female with atherosclerosis of native arteries of left lower extremity causing intermittent claudication.  Patient counseled patients with asymptomatic peripheral arterial disease or claudication have a 1-2% risk of developing chronic limb threatening ischemia, but a 15-30% risk of mortality in the next 5 years. Intervention should only be considered for medically optimized patients with disabling symptoms.   Recommend the following which can slow the progression of atherosclerosis and reduce the risk of major adverse cardiac / limb events:  Complete cessation from all tobacco products. Blood glucose control with goal A1c < 7%. Blood pressure control with goal blood pressure < 140/90 mmHg. Lipid reduction therapy with goal LDL-C <100 mg/dL (<16 if symptomatic from PAD).  Aspirin 81mg  PO QD.  Atorvastatin 40-80mg  PO QD (or other "high intensity" statin therapy). Daily walking to and past the point of discomfort. Patient counseled to keep a log of exercise distance. Adequate hydration (at least 2 liters / day) if patient's heart and kidney function is adequate.  With me in a year with repeat ABI.  CHIEF COMPLAINT: Leg pain with walking  HISTORY OF PRESENT ILLNESS: Andrea [name] is a 77 y.o. female referred to clinic for evaluation of peripheral arterial disease.  The patient reports cramping discomfort in her left calf with walking.  This is not always reproducible.  She does avoid long walks where she cannot rest.  She is reduced her walking speed because of discomfort.  She does not have any ischemic rest pain.  She has no ulcers about her feet.  VASCULAR SURGICAL HISTORY: none  VASCULAR RISK FACTORS: Positive history of stroke / transient ischemic attack. Negative history of coronary artery disease.  Positive history of diabetes mellitus. Last A1c 7.3. Positive history of smoking. Not  actively smoking. Positive history of hypertension.  Negative history of chronic kidney disease. Negative history of chronic obstructive pulmonary disease.  FUNCTIONAL STATUS: ECOG performance status: (2) Ambulatory and capable of self care, unable to carry out work activity, up and about > 50% or waking hours Ambulatory status: Ambulatory within the community with limits  Past Medical History:  Diagnosis Date   Anemia 04/30/2017   CHF (congestive heart failure) (HCC)    Diabetes mellitus    Hypertension    Hypokalemia 04/2017   Macular degeneration disease    loss of central vision on left and decreased peripheral vision.    Stroke (HCC) 12/2011   no residuals noted    Past Surgical History:  Procedure Laterality Date   CHOLECYSTECTOMY     CORONARY ARTERY BYPASS GRAFT N/A 04/18/2017   Procedure: CORONARY ARTERY BYPASS GRAFTING (CABG) times four using left internal mammary artery and right leg saphenous vein;  Surgeon: Kerin Perna, MD;  Location: [facility] OR;  Service: Open Heart Surgery;  Laterality: N/A;   EYE SURGERY     right   LEFT HEART CATH AND CORONARY ANGIOGRAPHY N/A 04/17/2017   Procedure: LEFT HEART CATH AND CORONARY ANGIOGRAPHY;  Surgeon: Marykay Lex, MD;  Location: [facility] INVASIVE CV LAB;  Service: Cardiovascular;  Laterality: N/A;   PUBOVAGINAL SLING N/A 07/31/2019   Procedure: CYSTOSCOPY MID URETHRAL  SLING;  Surgeon: Crist Fat, MD;  Location: [facility] ORS;  Service: Urology;  Laterality: N/A;   REFRACTIVE SURGERY Left    ROBOTIC ASSISTED LAPAROSCOPIC SACROCOLPOPEXY N/A 07/31/2019   Procedure: XI ROBOTIC ASSISTED LAPAROSCOPIC SACROCOLPOPEXY WIHT SUPRACERVICAL HYSTERECTOMY;  Surgeon: Crist Fat, MD;  Location: [facility] ORS;  Service: Urology;  Laterality: N/A;   ROTATOR CUFF REPAIR     left   TEE WITHOUT CARDIOVERSION N/A 04/18/2017   Procedure: TRANSESOPHAGEAL ECHOCARDIOGRAM (TEE);  Surgeon: Donata Clay, Theron Arista, MD;  Location: [facility] OR;  Service: Open Heart  Surgery;  Laterality: N/A;    Family History  Problem Relation Age of Onset   Heart attack Father    Hypertension Father    Heart attack Brother    Hypertension Brother    Hypertension Mother    Stroke Mother    Healthy Brother     Social History   Socioeconomic History   Marital status: Married    Spouse name: Not on file   Number of children: 3   Years of education: 16   Highest education level: Not on file  Occupational History   Occupation: Professor    Associate Professor: [employer]  Tobacco Use   Smoking status: Former    Current packs/day: 0.00    Average packs/day: 0.5 packs/day for 20.0 years (10.0 ttl pk-yrs)    Types: Cigarettes    Start date: 01/02/1977    Quit date: 01/02/1997    Years since quitting: 26.1   Smokeless tobacco: Never  Vaping Use   Vaping status: Never Used  Substance and Sexual Activity   Alcohol use: No   Drug use: No   Sexual activity: Not on file  Other Topics Concern   Not on file  Social History Narrative   Lives with husband and 3 children in a 2 story home.  Retired professor at [employer].  Education: college.    Social Determinants of Health   Financial Resource Strain: Not on file  Food Insecurity: Not on file  Transportation Needs: Not on file  Physical Activity: Not on file  Stress: Not on file  Social Connections: Not on file  Intimate Partner Violence: Not on file    No Known Allergies  Current Outpatient Medications  Medication Sig Dispense Refill   ALPRAZolam (XANAX) 0.25 MG tablet Take 1 tablet by mouth as needed.     aspirin EC 81 MG tablet Take 1 tablet (81 mg total) by mouth daily. 90 tablet 3   carvedilol (COREG) 6.25 MG tablet Take 1 tablet (6.25 mg total) by mouth 2 (two) times daily with a meal. 180 tablet 3   Cholecalciferol (VITAMIN D) 2000 UNITS tablet Take 2,000 Units by mouth daily.     COVID-19 mRNA bivalent vaccine, Pfizer, (PFIZER COVID-19 VAC BIVALENT) injection Inject into the muscle. 0.3 mL 0   COVID-19  mRNA Vac-TriS, Pfizer, (PFIZER-BIONT COVID-19 VAC-TRIS) SUSP injection Inject into the muscle. 0.3 mL 0   diphenhydramine-acetaminophen (TYLENOL PM) 25-500 MG TABS tablet Take 1 tablet by mouth at bedtime as needed.     FARXIGA 5 MG TABS tablet Take 5 mg by mouth daily.     icosapent Ethyl (VASCEPA) 1 g capsule Take 1 capsule (1 g total) by mouth 2 (two) times daily. 90 capsule 2   MYRBETRIQ 25 MG TB24 tablet Take 25 mg by mouth daily.     NOVOFINE PLUS 32G X 4 MM MISC UP TO FOUR TIMES DAILY AS DIRECTED  5   NOVOLOG FLEXPEN 100 UNIT/ML FlexPen SMARTSIG:12-20 Unit(s) SUB-Q 3 Times Daily     Olmesartan-amLODIPine-HCTZ 40-5-25 MG TABS TAKE 1 TABLET BY MOUTH EVERY DAY 90 tablet 3   ONETOUCH VERIO test strip USE TO TEST BLOOD SUGAR 4 TIMES DAILY  12   OZEMPIC, 0.25 OR 0.5  MG/DOSE, 2 MG/3ML SOPN Inject 0.5 mg into the skin once a week.     rosuvastatin (CRESTOR) 10 MG tablet TAKE 1 TABLET BY MOUTH EVERY DAY 90 tablet 3   TRESIBA FLEXTOUCH 100 UNIT/ML SOPN FlexTouch Pen Inject 26 Units into the skin every evening.  6   No current facility-administered medications for this visit.    PHYSICAL EXAM There were no vitals filed for this visit.  Well-appearing woman in no acute distress Regular rate and rhythm Unlabored breathing No palpable pedal pulses bilaterally No ulcers about the foot, toes, or interdigital spaces bilaterally  PERTINENT LABORATORY AND RADIOLOGIC DATA  Most recent CBC    Latest Ref Rng & Units 07/08/2022    4:32 PM 08/01/2019    4:41 AM 07/31/2019    1:15 PM  CBC  WBC 3.4 - 10.8 x10E3/uL 7.1     Hemoglobin 11.1 - 15.9 g/dL 27.2  9.4  53.6   Hematocrit 34.0 - 46.6 % 37.0  28.3  32.8   Platelets 150 - 450 x10E3/uL 214        Most recent CMP    Latest Ref Rng & Units 07/08/2022    4:32 PM 09/02/2021    8:46 AM 11/25/2019    1:44 PM  CMP  Glucose 70 - 99 mg/dL 644   034   BUN 8 - 27 mg/dL 25   25   Creatinine 7.42 - 1.00 mg/dL 5.95   6.38   Sodium 756 - 144 mmol/L  145   137   Potassium 3.5 - 5.2 mmol/L 3.9   4.3   Chloride 96 - 106 mmol/L 104   100   CO2 20 - 29 mmol/L 27   25   Calcium 8.7 - 10.3 mg/dL 9.2   9.2   Total Protein 6.0 - 8.5 g/dL 6.9  7.0    Total Bilirubin 0.0 - 1.2 mg/dL 0.8  0.6    Alkaline Phos 44 - 121 IU/L 69  64    AST 0 - 40 IU/L 22  21    ALT 0 - 32 IU/L 29  23      Renal function CrCl cannot be calculated (Patient's most recent lab result is older than the maximum 21 days allowed.).  Hgb A1c MFr Bld (%)  Date Value  07/29/2019 7.3 (H)    LDL Chol Calc (NIH)  Date Value Ref Range Status  09/02/2021 34 0 - 99 mg/dL Final     Vascular Imaging: ABI pre/post exercise:  Right:  Resting ankle brachial index:  0.90   Post exercise ABI: 0.48   Left:  Resting ankle brachial index: 1.11   Post exercise ABI: 0.99  Andrea Sianez N. Lenell Antu, MD [facility] Phone Number: [phone] 02/27/2023 2:40 PM  Total time spent on preparing this encounter including chart review, data review, collecting history, examining the patient, coordinating care for this new patient, 60 minutes.  Portions of this report may have been transcribed using voice recognition software.  Every effort has been made to ensure accuracy; however, inadvertent computerized transcription errors may still be present.

## 2023-02-28 ENCOUNTER — Ambulatory Visit (HOSPITAL_COMMUNITY)
Admission: RE | Admit: 2023-02-28 | Discharge: 2023-02-28 | Disposition: A | Discharge status: Home / Self Care | Payer: Medicare PPO | Source: Ambulatory Visit | Attending: Vascular Surgery | Admitting: Vascular Surgery

## 2023-02-28 ENCOUNTER — Ambulatory Visit: Payer: Medicare PPO | Admitting: Vascular Surgery

## 2023-02-28 ENCOUNTER — Encounter: Payer: Self-pay | Admitting: Vascular Surgery

## 2023-02-28 VITALS — BP 132/71 | HR 57 | Temp 98.3°F | Resp 20 | Ht 59.0 in | Wt 165.0 lb

## 2023-02-28 DIAGNOSIS — I739 Peripheral vascular disease, unspecified: Secondary | ICD-10-CM

## 2023-02-28 LAB — VAS US ABI WITH/WO TBI
Left ABI: 1.06
Right ABI: 0.82

## 2023-03-21 DIAGNOSIS — Z794 Long term (current) use of insulin: Secondary | ICD-10-CM | POA: Diagnosis not present

## 2023-03-21 DIAGNOSIS — I10 Essential (primary) hypertension: Secondary | ICD-10-CM | POA: Diagnosis not present

## 2023-03-21 DIAGNOSIS — Z1331 Encounter for screening for depression: Secondary | ICD-10-CM | POA: Diagnosis not present

## 2023-03-21 DIAGNOSIS — E7800 Pure hypercholesterolemia, unspecified: Secondary | ICD-10-CM | POA: Diagnosis not present

## 2023-03-21 DIAGNOSIS — E1122 Type 2 diabetes mellitus with diabetic chronic kidney disease: Secondary | ICD-10-CM | POA: Diagnosis not present

## 2023-03-21 DIAGNOSIS — N1832 Chronic kidney disease, stage 3b: Secondary | ICD-10-CM | POA: Diagnosis not present

## 2023-03-21 DIAGNOSIS — E1151 Type 2 diabetes mellitus with diabetic peripheral angiopathy without gangrene: Secondary | ICD-10-CM | POA: Diagnosis not present

## 2023-03-21 DIAGNOSIS — Z0000 Encounter for general adult medical examination without abnormal findings: Secondary | ICD-10-CM | POA: Diagnosis not present

## 2023-03-21 DIAGNOSIS — I25810 Atherosclerosis of coronary artery bypass graft(s) without angina pectoris: Secondary | ICD-10-CM | POA: Diagnosis not present

## 2023-05-01 DIAGNOSIS — L84 Corns and callosities: Secondary | ICD-10-CM | POA: Diagnosis not present

## 2023-05-01 DIAGNOSIS — M79675 Pain in left toe(s): Secondary | ICD-10-CM | POA: Diagnosis not present

## 2023-05-01 DIAGNOSIS — M2041 Other hammer toe(s) (acquired), right foot: Secondary | ICD-10-CM | POA: Diagnosis not present

## 2023-05-01 DIAGNOSIS — B351 Tinea unguium: Secondary | ICD-10-CM | POA: Diagnosis not present

## 2023-05-01 DIAGNOSIS — E1151 Type 2 diabetes mellitus with diabetic peripheral angiopathy without gangrene: Secondary | ICD-10-CM | POA: Diagnosis not present

## 2023-05-26 ENCOUNTER — Telehealth: Payer: Self-pay | Admitting: Cardiology

## 2023-05-26 DIAGNOSIS — I5032 Chronic diastolic (congestive) heart failure: Secondary | ICD-10-CM | POA: Diagnosis not present

## 2023-05-26 DIAGNOSIS — J069 Acute upper respiratory infection, unspecified: Secondary | ICD-10-CM | POA: Diagnosis not present

## 2023-05-26 MED ORDER — ICOSAPENT ETHYL 1 G PO CAPS: 1.0000 g | ORAL_CAPSULE | Freq: Two times a day (BID) | ORAL | 0 refills | Status: DC

## 2023-05-26 NOTE — Telephone Encounter (Signed)
Medication has been refilled and sent to patient's preferred pharmacy

## 2023-05-26 NOTE — Telephone Encounter (Signed)
*  STAT* If patient is at the pharmacy, call can be transferred to refill team.   1. Which medications need to be refilled? (please list name of each medication and dose if known)   icosapent Ethyl (VASCEPA) 1 g capsule   2. Would you like to learn more about the convenience, safety, & potential cost savings by using the [facility]?   3. Are you open to using the [facility] (Type [facility]. ).  4. Which pharmacy/location (including street and city if local pharmacy) is medication to be sent to?  [id number] - [place], [place] - [address]OSS [address]   5. Do they need a 30 day or 90 day supply?   90 day  Patient stated she is completely out of this medication.  Patient has appointment on 12/9.

## 2023-06-27 ENCOUNTER — Other Ambulatory Visit: Payer: Self-pay

## 2023-06-27 MED ORDER — OLMESARTAN-AMLODIPINE-HCTZ 40-5-25 MG PO TABS: ORAL_TABLET | ORAL | 0 refills | Status: DC

## 2023-07-03 ENCOUNTER — Other Ambulatory Visit: Payer: Self-pay

## 2023-07-03 MED ORDER — ROSUVASTATIN CALCIUM 10 MG PO TABS: 10.0000 mg | ORAL_TABLET | Freq: Every day | ORAL | 0 refills | Status: DC

## 2023-07-17 ENCOUNTER — Encounter: Payer: Self-pay | Admitting: Cardiology

## 2023-07-17 ENCOUNTER — Ambulatory Visit: Payer: Medicare PPO | Attending: Cardiology | Admitting: Cardiology

## 2023-07-17 VITALS — BP 92/60 | Ht 59.0 in | Wt 162.2 lb

## 2023-07-17 DIAGNOSIS — Z794 Long term (current) use of insulin: Secondary | ICD-10-CM

## 2023-07-17 DIAGNOSIS — I10 Essential (primary) hypertension: Secondary | ICD-10-CM

## 2023-07-17 DIAGNOSIS — E119 Type 2 diabetes mellitus without complications: Secondary | ICD-10-CM | POA: Diagnosis not present

## 2023-07-17 DIAGNOSIS — I2510 Atherosclerotic heart disease of native coronary artery without angina pectoris: Secondary | ICD-10-CM | POA: Diagnosis not present

## 2023-07-17 NOTE — Patient Instructions (Addendum)
Medication Instructions:  Your physician recommends that you continue on your current medications as directed. Please refer to the Current Medication list given to you today.  *If you need a refill on your cardiac medications before your next appointment, please call your pharmacy*   Lab Work: HgbA1c If you have labs (blood work) drawn today and your tests are completely normal, you will receive your results only by: MyChart Message (if you have MyChart) OR A paper copy in the mail If you have any lab test that is abnormal or we need to change your treatment, we will call you to review the results.  Follow-Up: At [facility], you and your health needs are our priority.  As part of our continuing mission to provide you with exceptional heart care, we have created designated Provider Care Teams.  These Care Teams include your primary Cardiologist (physician) and Advanced Practice Providers (APPs -  Physician Assistants and Nurse Practitioners) who all work together to provide you with the care you need, when you need it.  Your next appointment:   1 year(s)  Provider:   Thomasene Ripple, DO  Other Instructions You are invited to attend: [facility] event on February Friday 7th 2025 at [facility] ([address] [place], [place], [place] [address]) from 8am-12pm. Feel free to invite other women to attend!  See you there!     Diabetes Mellitus and Nutrition, Adult When you have diabetes, or diabetes mellitus, it is very important to have healthy eating habits because your blood sugar (glucose) levels are greatly affected by what you eat and drink. Eating healthy foods in the right amounts, at about the same times every day, can help you: Manage your blood glucose. Lower your risk of heart disease. Improve your blood pressure. Reach or maintain a healthy weight. What can affect my meal plan? Every person with diabetes is different, and each person has different needs for a meal  plan. Your health care provider may recommend that you work with a dietitian to make a meal plan that is best for you. Your meal plan may vary depending on factors such as: The calories you need. The medicines you take. Your weight. Your blood glucose, blood pressure, and cholesterol levels. Your activity level. Other health conditions you have, such as heart or kidney disease. How do carbohydrates affect me? Carbohydrates, also called carbs, affect your blood glucose level more than any other type of food. Eating carbs raises the amount of glucose in your blood. It is important to know how many carbs you can safely have in each meal. This is different for every person. Your dietitian can help you calculate how many carbs you should have at each meal and for each snack. How does alcohol affect me? Alcohol can cause a decrease in blood glucose (hypoglycemia), especially if you use insulin or take certain diabetes medicines by mouth. Hypoglycemia can be a life-threatening condition. Symptoms of hypoglycemia, such as sleepiness, dizziness, and confusion, are similar to symptoms of having too much alcohol. Do not drink alcohol if: Your health care provider tells you not to drink. You are pregnant, may be pregnant, or are planning to become pregnant. If you drink alcohol: Limit how much you have to: 0-1 drink a day for women. 0-2 drinks a day for men. Know how much alcohol is in your drink. In the [place] one drink equals one 12 oz bottle of beer (355 mL), one 5 oz glass of wine (148 mL), or  one 1 oz glass of hard liquor (44 mL). Keep yourself hydrated with water, diet soda, or unsweetened iced tea. Keep in mind that regular soda, juice, and other mixers may contain a lot of sugar and must be counted as carbs. What are tips for following this plan?  Reading food labels Start by checking the serving size on the Nutrition Facts label of packaged foods and drinks. The number of calories and the amount  of carbs, fats, and other nutrients listed on the label are based on one serving of the item. Many items contain more than one serving per package. Check the total grams (g) of carbs in one serving. Check the number of grams of saturated fats and trans fats in one serving. Choose foods that have a low amount or none of these fats. Check the number of milligrams (mg) of salt (sodium) in one serving. Most people should limit total sodium intake to less than 2,300 mg per day. Always check the nutrition information of foods labeled as "low-fat" or "nonfat." These foods may be higher in added sugar or refined carbs and should be avoided. Talk to your dietitian to identify your daily goals for nutrients listed on the label. Shopping Avoid buying canned, pre-made, or processed foods. These foods tend to be high in fat, sodium, and added sugar. Shop around the outside edge of the grocery store. This is where you will most often find fresh fruits and vegetables, bulk grains, fresh meats, and fresh dairy products. Cooking Use low-heat cooking methods, such as baking, instead of high-heat cooking methods, such as deep frying. Cook using healthy oils, such as olive, canola, or sunflower oil. Avoid cooking with butter, cream, or high-fat meats. Meal planning Eat meals and snacks regularly, preferably at the same times every day. Avoid going long periods of time without eating. Eat foods that are high in fiber, such as fresh fruits, vegetables, beans, and whole grains. Eat 4-6 oz (112-168 g) of lean protein each day, such as lean meat, chicken, fish, eggs, or tofu. One ounce (oz) (28 g) of lean protein is equal to: 1 oz (28 g) of meat, chicken, or fish. 1 egg.  cup (62 g) of tofu. Eat some foods each day that contain healthy fats, such as avocado, nuts, seeds, and fish. What foods should I eat? Fruits Berries. Apples. Oranges. Peaches. Apricots. Plums. Grapes. Mangoes. Papayas. Pomegranates. Kiwi.  Cherries. Vegetables Leafy greens, including lettuce, spinach, kale, chard, collard greens, mustard greens, and cabbage. Beets. Cauliflower. Broccoli. Carrots. Green beans. Tomatoes. Peppers. Onions. Cucumbers. Brussels sprouts. Grains Whole grains, such as whole-wheat or whole-grain bread, crackers, tortillas, cereal, and pasta. Unsweetened oatmeal. Quinoa. Brown or wild rice. Meats and other proteins Seafood. Poultry without skin. Lean cuts of poultry and beef. Tofu. Nuts. Seeds. Dairy Low-fat or fat-free dairy products such as milk, yogurt, and cheese. The items listed above may not be a complete list of foods and beverages you can eat and drink. Contact a dietitian for more information. What foods should I avoid? Fruits Fruits canned with syrup. Vegetables Canned vegetables. Frozen vegetables with butter or cream sauce. Grains Refined white flour and flour products such as bread, pasta, snack foods, and cereals. Avoid all processed foods. Meats and other proteins Fatty cuts of meat. Poultry with skin. Breaded or fried meats. Processed meat. Avoid saturated fats. Dairy Full-fat yogurt, cheese, or milk. Beverages Sweetened drinks, such as soda or iced tea. The items listed above may not be a complete list of foods  and beverages you should avoid. Contact a dietitian for more information. Questions to ask a health care provider Do I need to meet with a certified diabetes care and education specialist? Do I need to meet with a dietitian? What number can I call if I have questions? When are the best times to check my blood glucose? Where to find more information: American Diabetes Association: diabetes.org Academy of Nutrition and Dietetics: eatright.[employer] of Diabetes and Digestive and Kidney Diseases: StageSync.si Association of Diabetes Care & Education Specialists: diabeteseducator.org Summary It is important to have healthy eating habits because your blood sugar  (glucose) levels are greatly affected by what you eat and drink. It is important to use alcohol carefully. A healthy meal plan will help you manage your blood glucose and lower your risk of heart disease. Your health care provider may recommend that you work with a dietitian to make a meal plan that is best for you. This information is not intended to replace advice given to you by your health care provider. Make sure you discuss any questions you have with your health care provider. Document Revised: 02/26/2020 Document Reviewed: 02/26/2020 Elsevier Patient Education  2024 [employer].

## 2023-07-17 NOTE — Progress Notes (Signed)
Cardiology Office Note:    Date:  07/17/2023   ID:  Andrea [name], DOB Apr 23, 1946, MRN [id number]  PCP:  Renford Dills, MD  Cardiologist:  Thomasene Ripple, DO  Electrophysiologist:  None   Referring MD: Renford Dills, MD   " I am ok"   History of Present Illness:    Andrea [name] is a 77 y.o. female with a hx of CAD s/p CABG 2018 following NSTEMI, DM type II, HTN, HLD, PAD with intermittent claudication, CVA (microvascular stroke 2013.   Previously followed with Dr. Katrinka Blazing, did see one of our APP's last year.  She is here today to establish care.  Only problem today is the fact that she is having trouble sleeping, waking up every hour and a half to two hours. The patient has tried various natural remedies, including warm milk and tart cherry juice, with some success.  The patient is active, doing personal training once a week for 30 minutes. The patient does not do much walking but is often on her feet. The patient has been advised to avoid standing for long periods without moving to promote circulation.  Past Medical History:  Diagnosis Date   Anemia 04/30/2017   CHF (congestive heart failure) (HCC)    Diabetes mellitus    Hypertension    Hypokalemia 04/2017   Macular degeneration disease    loss of central vision on left and decreased peripheral vision.    Stroke (HCC) 12/2011   no residuals noted    Past Surgical History:  Procedure Laterality Date   CHOLECYSTECTOMY     CORONARY ARTERY BYPASS GRAFT N/A 04/18/2017   Procedure: CORONARY ARTERY BYPASS GRAFTING (CABG) times four using left internal mammary artery and right leg saphenous vein;  Surgeon: Kerin Perna, MD;  Location: [facility] OR;  Service: Open Heart Surgery;  Laterality: N/A;   EYE SURGERY     right   LEFT HEART CATH AND CORONARY ANGIOGRAPHY N/A 04/17/2017   Procedure: LEFT HEART CATH AND CORONARY ANGIOGRAPHY;  Surgeon: Marykay Lex, MD;  Location: [facility] INVASIVE CV LAB;  Service: Cardiovascular;  Laterality:  N/A;   PUBOVAGINAL SLING N/A 07/31/2019   Procedure: CYSTOSCOPY MID URETHRAL  SLING;  Surgeon: Crist Fat, MD;  Location: [facility] ORS;  Service: Urology;  Laterality: N/A;   REFRACTIVE SURGERY Left    ROBOTIC ASSISTED LAPAROSCOPIC SACROCOLPOPEXY N/A 07/31/2019   Procedure: XI ROBOTIC ASSISTED LAPAROSCOPIC SACROCOLPOPEXY WIHT SUPRACERVICAL HYSTERECTOMY;  Surgeon: Crist Fat, MD;  Location: [facility] ORS;  Service: Urology;  Laterality: N/A;   ROTATOR CUFF REPAIR     left   TEE WITHOUT CARDIOVERSION N/A 04/18/2017   Procedure: TRANSESOPHAGEAL ECHOCARDIOGRAM (TEE);  Surgeon: Donata Clay, Theron Arista, MD;  Location: [facility] OR;  Service: Open Heart Surgery;  Laterality: N/A;    Current Medications: Current Meds  Medication Sig   ALPRAZolam (XANAX) 0.25 MG tablet Take 1 tablet by mouth as needed.   aspirin EC 81 MG tablet Take 1 tablet (81 mg total) by mouth daily.   carvedilol (COREG) 6.25 MG tablet Take 1 tablet (6.25 mg total) by mouth 2 (two) times daily with a meal.   Cholecalciferol (VITAMIN D) 2000 UNITS tablet Take 2,000 Units by mouth daily.   diphenhydramine-acetaminophen (TYLENOL PM) 25-500 MG TABS tablet Take 1 tablet by mouth at bedtime as needed.   FARXIGA 5 MG TABS tablet Take 5 mg by mouth daily.   icosapent Ethyl (VASCEPA) 1 g capsule Take 1 capsule (1 g total) by mouth 2 (  two) times daily.   MYRBETRIQ 25 MG TB24 tablet Take 25 mg by mouth daily.   NOVOFINE PLUS 32G X 4 MM MISC UP TO FOUR TIMES DAILY AS DIRECTED   NOVOLOG FLEXPEN 100 UNIT/ML FlexPen SMARTSIG:12-20 Unit(s) SUB-Q 3 Times Daily   Olmesartan-amLODIPine-HCTZ 40-5-25 MG TABS Please call [phone] to schedule an appointment for future refills. Thank you.   ONETOUCH VERIO test strip USE TO TEST BLOOD SUGAR 4 TIMES DAILY   OZEMPIC, 0.25 OR 0.5 MG/DOSE, 2 MG/3ML SOPN Inject 0.5 mg into the skin once a week.   rosuvastatin (CRESTOR) 10 MG tablet Take 1 tablet (10 mg total) by mouth daily.   TRESIBA FLEXTOUCH 100 UNIT/ML SOPN  FlexTouch Pen Inject 26 Units into the skin every evening.     Allergies:   Patient has no known allergies.   Social History   Socioeconomic History   Marital status: Married    Spouse name: Not on file   Number of children: 3   Years of education: 16   Highest education level: Not on file  Occupational History   Occupation: Professor    Associate Professor: [employer]  Tobacco Use   Smoking status: Former    Current packs/day: 0.00    Average packs/day: 0.5 packs/day for 20.0 years (10.0 ttl pk-yrs)    Types: Cigarettes    Start date: 01/02/1977    Quit date: 01/02/1997    Years since quitting: 26.5   Smokeless tobacco: Never  Vaping Use   Vaping status: Never Used  Substance and Sexual Activity   Alcohol use: No   Drug use: No   Sexual activity: Not on file  Other Topics Concern   Not on file  Social History Narrative   Lives with husband and 3 children in a 2 story home.  Retired professor at [employer].  Education: college.    Social Determinants of Health   Financial Resource Strain: Not on file  Food Insecurity: Not on file  Transportation Needs: Not on file  Physical Activity: Not on file  Stress: Not on file  Social Connections: Not on file     Family History: The patient's family history includes Healthy in her brother; Heart attack in her brother and father; Hypertension in her brother, father, and mother; Stroke in her mother.  ROS:   Review of Systems  Constitution: Negative for decreased appetite, fever and weight gain.  HENT: Negative for congestion, ear discharge, hoarse voice and sore throat.   Eyes: Negative for discharge, redness, vision loss in right eye and visual halos.  Cardiovascular: Negative for chest pain, dyspnea on exertion, leg swelling, orthopnea and palpitations.  Respiratory: Negative for cough, hemoptysis, shortness of breath and snoring.   Endocrine: Negative for heat intolerance and polyphagia.  Hematologic/Lymphatic: Negative for bleeding  problem. Does not bruise/bleed easily.  Skin: Negative for flushing, nail changes, rash and suspicious lesions.  Musculoskeletal: Negative for arthritis, joint pain, muscle cramps, myalgias, neck pain and stiffness.  Gastrointestinal: Negative for abdominal pain, bowel incontinence, diarrhea and excessive appetite.  Genitourinary: Negative for decreased libido, genital sores and incomplete emptying.  Neurological: Negative for brief paralysis, focal weakness, headaches and loss of balance.  Psychiatric/Behavioral: Negative for altered mental status, depression and suicidal ideas.  Allergic/Immunologic: Negative for HIV exposure and persistent infections.    EKGs/Labs/Other Studies Reviewed:    The following studies were reviewed today:   EKG:  The ekg ordered today demonstrates   Recent Labs: No results found for requested labs within  last 365 days.  Recent Lipid Panel    Component Value Date/Time   CHOL 110 09/02/2021 0846   TRIG 92 09/02/2021 0846   HDL 58 09/02/2021 0846   CHOLHDL 1.9 09/02/2021 0846   CHOLHDL 3.3 04/17/2017 0014   VLDL 41 (H) 04/17/2017 0014   LDLCALC 34 09/02/2021 0846    Physical Exam:    VS:  BP 92/60 (BP Location: Right Arm, Patient Position: Sitting, Cuff Size: Normal)   Ht 4\' 11"  (1.499 m)   Wt 162 lb 3.2 oz (73.6 kg)   SpO2 97%   BMI 32.76 kg/m     Wt Readings from Last 3 Encounters:  07/17/23 162 lb 3.2 oz (73.6 kg)  02/28/23 165 lb (74.8 kg)  07/08/22 165 lb (74.8 kg)     GEN: Well nourished, well developed in no acute distress HEENT: Normal NECK: No JVD; No carotid bruits LYMPHATICS: No lymphadenopathy CARDIAC: S1S2 noted,RRR, no murmurs, rubs, gallops RESPIRATORY:  Clear to auscultation without rales, wheezing or rhonchi  ABDOMEN: Soft, non-tender, non-distended, +bowel sounds, no guarding. EXTREMITIES: No edema, No cyanosis, no clubbing MUSCULOSKELETAL:  No deformity  SKIN: Warm and dry NEUROLOGIC:  Alert and oriented x 3,  non-focal PSYCHIATRIC:  Normal affect, good insight  ASSESSMENT:    1. Hypertension, unspecified type   2. Diabetes mellitus, type II, insulin dependent (HCC)   3. Coronary artery disease involving native coronary artery of native heart without angina pectoris    PLAN:     Coronary Artery Disease History of angina and myocardial infarction in 2018. Currently asymptomatic with no recurrent chest pain. On aspirin, carvedilol, and Crestor. LDL is well controlled at 24. -Continue current medications. -Continue annual visits, with availability for contact as needed.  Type 2 Diabetes Mellitus Hemoglobin A1c was 7.7 in August 2024, indicating suboptimal glycemic control. Patient acknowledges dietary challenges. -Order Hemoglobin A1c today. -Encourage continued efforts towards dietary modifications for better glycemic control.  Hypertension  Blood pressure is acceptable, continue with current antihypertensive regimen.  General Health Maintenance -Invite to system Women's Heart event in February for cardiovascular disease education.  The patient is in agreement with the above plan. The patient left the office in stable condition.  The patient will follow up in   Medication Adjustments/Labs and Tests Ordered: Current medicines are reviewed at length with the patient today.  Concerns regarding medicines are outlined above.  Orders Placed This Encounter  Procedures   Hemoglobin A1c   EKG 12-Lead   No orders of the defined types were placed in this encounter.   Patient Instructions  Medication Instructions:  Your physician recommends that you continue on your current medications as directed. Please refer to the Current Medication list given to you today.  *If you need a refill on your cardiac medications before your next appointment, please call your pharmacy*   Lab Work: HgbA1c If you have labs (blood work) drawn today and your tests are completely normal, you will receive your  results only by: MyChart Message (if you have MyChart) OR A paper copy in the mail If you have any lab test that is abnormal or we need to change your treatment, we will call you to review the results.  Follow-Up: At [facility], you and your health needs are our priority.  As part of our continuing mission to provide you with exceptional heart care, we have created designated Provider Care Teams.  These Care Teams include your primary Cardiologist (physician) and Advanced Practice Providers (APPs -  Physician Assistants and Nurse Practitioners) who all work together to provide you with the care you need, when you need it.  Your next appointment:   1 year(s)  Provider:   Thomasene Ripple, DO  Other Instructions You are invited to attend: Women's [facility] Community event on February Friday 7th 2025 at [facility] ([address] [place], [place], [place] [address]) from 8am-12pm. Feel free to invite other women to attend!  See you there!     Diabetes Mellitus and Nutrition, Adult When you have diabetes, or diabetes mellitus, it is very important to have healthy eating habits because your blood sugar (glucose) levels are greatly affected by what you eat and drink. Eating healthy foods in the right amounts, at about the same times every day, can help you: Manage your blood glucose. Lower your risk of heart disease. Improve your blood pressure. Reach or maintain a healthy weight. What can affect my meal plan? Every person with diabetes is different, and each person has different needs for a meal plan. Your health care provider may recommend that you work with a dietitian to make a meal plan that is best for you. Your meal plan may vary depending on factors such as: The calories you need. The medicines you take. Your weight. Your blood glucose, blood pressure, and cholesterol levels. Your activity level. Other health conditions you have, such as heart or kidney disease. How do carbohydrates  affect me? Carbohydrates, also called carbs, affect your blood glucose level more than any other type of food. Eating carbs raises the amount of glucose in your blood. It is important to know how many carbs you can safely have in each meal. This is different for every person. Your dietitian can help you calculate how many carbs you should have at each meal and for each snack. How does alcohol affect me? Alcohol can cause a decrease in blood glucose (hypoglycemia), especially if you use insulin or take certain diabetes medicines by mouth. Hypoglycemia can be a life-threatening condition. Symptoms of hypoglycemia, such as sleepiness, dizziness, and confusion, are similar to symptoms of having too much alcohol. Do not drink alcohol if: Your health care provider tells you not to drink. You are pregnant, may be pregnant, or are planning to become pregnant. If you drink alcohol: Limit how much you have to: 0-1 drink a day for women. 0-2 drinks a day for men. Know how much alcohol is in your drink. In the [place] one drink equals one 12 oz bottle of beer (355 mL), one 5 oz glass of wine (148 mL), or one 1 oz glass of hard liquor (44 mL). Keep yourself hydrated with water, diet soda, or unsweetened iced tea. Keep in mind that regular soda, juice, and other mixers may contain a lot of sugar and must be counted as carbs. What are tips for following this plan?  Reading food labels Start by checking the serving size on the Nutrition Facts label of packaged foods and drinks. The number of calories and the amount of carbs, fats, and other nutrients listed on the label are based on one serving of the item. Many items contain more than one serving per package. Check the total grams (g) of carbs in one serving. Check the number of grams of saturated fats and trans fats in one serving. Choose foods that have a low amount or none of these fats. Check the number of milligrams (mg) of salt (sodium) in one serving.  Most people should limit total sodium  intake to less than 2,300 mg per day. Always check the nutrition information of foods labeled as "low-fat" or "nonfat." These foods may be higher in added sugar or refined carbs and should be avoided. Talk to your dietitian to identify your daily goals for nutrients listed on the label. Shopping Avoid buying canned, pre-made, or processed foods. These foods tend to be high in fat, sodium, and added sugar. Shop around the outside edge of the grocery store. This is where you will most often find fresh fruits and vegetables, bulk grains, fresh meats, and fresh dairy products. Cooking Use low-heat cooking methods, such as baking, instead of high-heat cooking methods, such as deep frying. Cook using healthy oils, such as olive, canola, or sunflower oil. Avoid cooking with butter, cream, or high-fat meats. Meal planning Eat meals and snacks regularly, preferably at the same times every day. Avoid going long periods of time without eating. Eat foods that are high in fiber, such as fresh fruits, vegetables, beans, and whole grains. Eat 4-6 oz (112-168 g) of lean protein each day, such as lean meat, chicken, fish, eggs, or tofu. One ounce (oz) (28 g) of lean protein is equal to: 1 oz (28 g) of meat, chicken, or fish. 1 egg.  cup (62 g) of tofu. Eat some foods each day that contain healthy fats, such as avocado, nuts, seeds, and fish. What foods should I eat? Fruits Berries. Apples. Oranges. Peaches. Apricots. Plums. Grapes. Mangoes. Papayas. Pomegranates. Kiwi. Cherries. Vegetables Leafy greens, including lettuce, spinach, kale, chard, collard greens, mustard greens, and cabbage. Beets. Cauliflower. Broccoli. Carrots. Green beans. Tomatoes. Peppers. Onions. Cucumbers. Brussels sprouts. Grains Whole grains, such as whole-wheat or whole-grain bread, crackers, tortillas, cereal, and pasta. Unsweetened oatmeal. Quinoa. Brown or wild rice. Meats and other  proteins Seafood. Poultry without skin. Lean cuts of poultry and beef. Tofu. Nuts. Seeds. Dairy Low-fat or fat-free dairy products such as milk, yogurt, and cheese. The items listed above may not be a complete list of foods and beverages you can eat and drink. Contact a dietitian for more information. What foods should I avoid? Fruits Fruits canned with syrup. Vegetables Canned vegetables. Frozen vegetables with butter or cream sauce. Grains Refined white flour and flour products such as bread, pasta, snack foods, and cereals. Avoid all processed foods. Meats and other proteins Fatty cuts of meat. Poultry with skin. Breaded or fried meats. Processed meat. Avoid saturated fats. Dairy Full-fat yogurt, cheese, or milk. Beverages Sweetened drinks, such as soda or iced tea. The items listed above may not be a complete list of foods and beverages you should avoid. Contact a dietitian for more information. Questions to ask a health care provider Do I need to meet with a certified diabetes care and education specialist? Do I need to meet with a dietitian? What number can I call if I have questions? When are the best times to check my blood glucose? Where to find more information: American Diabetes Association: diabetes.org Academy of Nutrition and Dietetics: eatright.[employer] of Diabetes and Digestive and Kidney Diseases: StageSync.si Association of Diabetes Care & Education Specialists: diabeteseducator.org Summary It is important to have healthy eating habits because your blood sugar (glucose) levels are greatly affected by what you eat and drink. It is important to use alcohol carefully. A healthy meal plan will help you manage your blood glucose and lower your risk of heart disease. Your health care provider may recommend that you work with a dietitian to make a meal plan that  is best for you. This information is not intended to replace advice given to you by your health  care provider. Make sure you discuss any questions you have with your health care provider. Document Revised: 02/26/2020 Document Reviewed: 02/26/2020 Elsevier Patient Education  2024 Elsevier Inc.    Adopting a Healthy Lifestyle.  Know what a healthy weight is for you (roughly BMI <25) and aim to maintain this   Aim for 7+ servings of fruits and vegetables daily   65-80+ fluid ounces of water or unsweet tea for healthy kidneys   Limit to max 1 drink of alcohol per day; avoid smoking/tobacco   Limit animal fats in diet for cholesterol and heart health - choose grass fed whenever available   Avoid highly processed foods, and foods high in saturated/trans fats   Aim for low stress - take time to unwind and care for your mental health   Aim for 150 min of moderate intensity exercise weekly for heart health, and weights twice weekly for bone health   Aim for 7-9 hours of sleep daily   When it comes to diets, agreement about the perfect plan isnt easy to find, even among the experts. Experts at the [facility] [employer] developed an idea known as the Healthy Eating Plate. Just imagine a plate divided into logical, healthy portions.   The emphasis is on diet quality:   Load up on vegetables and fruits - one-half of your plate: Aim for color and variety, and remember that potatoes dont count.   Go for whole grains - one-quarter of your plate: Whole wheat, barley, wheat berries, quinoa, oats, brown rice, and foods made with them. If you want pasta, go with whole wheat pasta.   Protein power - one-quarter of your plate: Fish, chicken, beans, and nuts are all healthy, versatile protein sources. Limit red meat.   The diet, however, does go beyond the plate, offering a few other suggestions.   Use healthy plant oils, such as olive, canola, soy, corn, sunflower and peanut. Check the labels, and avoid partially hydrogenated oil, which have unhealthy trans fats.   If youre  thirsty, drink water. Coffee and tea are good in moderation, but skip sugary drinks and limit milk and dairy products to one or two daily servings.   The type of carbohydrate in the diet is more important than the amount. Some sources of carbohydrates, such as vegetables, fruits, whole grains, and beans-are healthier than others.   Finally, stay active  Signed, Thomasene Ripple, DO  07/17/2023 2:56 PM    [facility]

## 2023-07-18 LAB — HEMOGLOBIN A1C
Est. average glucose Bld gHb Est-mCnc: 169 mg/dL
Hgb A1c MFr Bld: 7.5 % — ABNORMAL HIGH (ref 4.8–5.6)

## 2023-08-26 ENCOUNTER — Other Ambulatory Visit: Payer: Self-pay | Admitting: Cardiology

## 2023-08-28 NOTE — Telephone Encounter (Signed)
*  STAT* If patient is at the pharmacy, call can be transferred to refill team.   1. Which medications need to be refilled? (please list name of each medication and dose if known) Vascepa   2. Would you like to learn more about the convenience, safety, & potential cost savings by using the [facility]?      3. Are you open to using the [facility] (Type [facility]. .   4. Which pharmacy/location (including street and city if local pharmacy) is medication to be sent to? [facility] [address], [place], [place]    5. Do they need a 30 day or 90 day supply? 90 days and refills- please call today, out of medicine

## 2023-09-07 ENCOUNTER — Telehealth: Payer: Self-pay | Admitting: Cardiology

## 2023-09-07 NOTE — Telephone Encounter (Signed)
Patient stated she received a co-pay for lab work done on 12/9 and wants a call back regarding this charge as she has never had to pay this before.

## 2023-09-08 NOTE — Telephone Encounter (Signed)
Talked to patient regarding receiving a copay bill from [facility] for labs done 07/17/23. She is upset that she wasn't told one would be billed and the [facility] employee did not let her know they were not a [facility] employee.  The patient wants Dr. Servando Salina to be aware.

## 2023-10-03 DIAGNOSIS — I25810 Atherosclerosis of coronary artery bypass graft(s) without angina pectoris: Secondary | ICD-10-CM | POA: Diagnosis not present

## 2023-10-03 DIAGNOSIS — E7800 Pure hypercholesterolemia, unspecified: Secondary | ICD-10-CM | POA: Diagnosis not present

## 2023-10-03 DIAGNOSIS — N1832 Chronic kidney disease, stage 3b: Secondary | ICD-10-CM | POA: Diagnosis not present

## 2023-10-03 DIAGNOSIS — I739 Peripheral vascular disease, unspecified: Secondary | ICD-10-CM | POA: Diagnosis not present

## 2023-10-03 DIAGNOSIS — E1122 Type 2 diabetes mellitus with diabetic chronic kidney disease: Secondary | ICD-10-CM | POA: Diagnosis not present

## 2023-10-03 DIAGNOSIS — Z794 Long term (current) use of insulin: Secondary | ICD-10-CM | POA: Diagnosis not present

## 2023-10-03 DIAGNOSIS — E1151 Type 2 diabetes mellitus with diabetic peripheral angiopathy without gangrene: Secondary | ICD-10-CM | POA: Diagnosis not present

## 2023-10-03 DIAGNOSIS — I10 Essential (primary) hypertension: Secondary | ICD-10-CM | POA: Diagnosis not present

## 2023-10-04 DIAGNOSIS — H2522 Age-related cataract, morgagnian type, left eye: Secondary | ICD-10-CM | POA: Diagnosis not present

## 2023-10-04 DIAGNOSIS — H524 Presbyopia: Secondary | ICD-10-CM | POA: Diagnosis not present

## 2023-10-04 DIAGNOSIS — H02831 Dermatochalasis of right upper eyelid: Secondary | ICD-10-CM | POA: Diagnosis not present

## 2023-10-04 DIAGNOSIS — H02834 Dermatochalasis of left upper eyelid: Secondary | ICD-10-CM | POA: Diagnosis not present

## 2023-10-06 ENCOUNTER — Other Ambulatory Visit: Payer: Self-pay | Admitting: Cardiology

## 2023-10-29 ENCOUNTER — Other Ambulatory Visit: Payer: Self-pay | Admitting: Cardiovascular Disease

## 2023-10-30 ENCOUNTER — Other Ambulatory Visit: Payer: Self-pay | Admitting: Cardiology

## 2023-12-15 DIAGNOSIS — H2522 Age-related cataract, morgagnian type, left eye: Secondary | ICD-10-CM | POA: Diagnosis not present

## 2023-12-15 DIAGNOSIS — Z961 Presence of intraocular lens: Secondary | ICD-10-CM | POA: Diagnosis not present

## 2023-12-15 DIAGNOSIS — H25812 Combined forms of age-related cataract, left eye: Secondary | ICD-10-CM | POA: Diagnosis not present

## 2023-12-15 DIAGNOSIS — H02831 Dermatochalasis of right upper eyelid: Secondary | ICD-10-CM | POA: Diagnosis not present

## 2024-01-09 DIAGNOSIS — H2512 Age-related nuclear cataract, left eye: Secondary | ICD-10-CM | POA: Diagnosis not present

## 2024-01-09 DIAGNOSIS — H268 Other specified cataract: Secondary | ICD-10-CM | POA: Diagnosis not present

## 2024-01-09 DIAGNOSIS — H25812 Combined forms of age-related cataract, left eye: Secondary | ICD-10-CM | POA: Diagnosis not present

## 2024-01-09 DIAGNOSIS — Z538 Procedure and treatment not carried out for other reasons: Secondary | ICD-10-CM | POA: Diagnosis not present

## 2024-01-09 DIAGNOSIS — H2189 Other specified disorders of iris and ciliary body: Secondary | ICD-10-CM | POA: Diagnosis not present

## 2024-01-11 DIAGNOSIS — H59022 Cataract (lens) fragments in eye following cataract surgery, left eye: Secondary | ICD-10-CM | POA: Diagnosis not present

## 2024-01-11 DIAGNOSIS — H35033 Hypertensive retinopathy, bilateral: Secondary | ICD-10-CM | POA: Diagnosis not present

## 2024-01-11 DIAGNOSIS — E113291 Type 2 diabetes mellitus with mild nonproliferative diabetic retinopathy without macular edema, right eye: Secondary | ICD-10-CM | POA: Diagnosis not present

## 2024-01-11 DIAGNOSIS — H2702 Aphakia, left eye: Secondary | ICD-10-CM | POA: Diagnosis not present

## 2024-01-11 DIAGNOSIS — H43812 Vitreous degeneration, left eye: Secondary | ICD-10-CM | POA: Diagnosis not present

## 2024-01-17 DIAGNOSIS — H2702 Aphakia, left eye: Secondary | ICD-10-CM | POA: Diagnosis not present

## 2024-01-17 DIAGNOSIS — H59022 Cataract (lens) fragments in eye following cataract surgery, left eye: Secondary | ICD-10-CM | POA: Diagnosis not present

## 2024-01-19 ENCOUNTER — Telehealth: Payer: Self-pay | Admitting: Cardiology

## 2024-01-19 MED ORDER — CARVEDILOL 6.25 MG PO TABS: 6.2500 mg | ORAL_TABLET | Freq: Two times a day (BID) | ORAL | 1 refills | Status: DC

## 2024-01-19 NOTE — Telephone Encounter (Signed)
 Pt's medication was sent to pt's pharmacy as requested. Confirmation received.

## 2024-01-19 NOTE — Telephone Encounter (Signed)
*  STAT* If patient is at the pharmacy, call can be transferred to refill team.   1. Which medications need to be refilled? (please list name of each medication and dose if known) Carvedilol    2. Would you like to learn more about the convenience, safety, & potential cost savings by using the [facility]?    3. Are you open to using the [facility] (Type [facility].   4. Which pharmacy/location (including street and city if local pharmacy) is medication to be sent to? [facility] [place]   5. Do they need a 30 day or 90 day supply?

## 2024-02-15 DIAGNOSIS — Z9889 Other specified postprocedural states: Secondary | ICD-10-CM | POA: Diagnosis not present

## 2024-02-15 DIAGNOSIS — E113293 Type 2 diabetes mellitus with mild nonproliferative diabetic retinopathy without macular edema, bilateral: Secondary | ICD-10-CM | POA: Diagnosis not present

## 2024-02-15 DIAGNOSIS — H348322 Tributary (branch) retinal vein occlusion, left eye, stable: Secondary | ICD-10-CM | POA: Diagnosis not present

## 2024-02-15 DIAGNOSIS — H59022 Cataract (lens) fragments in eye following cataract surgery, left eye: Secondary | ICD-10-CM | POA: Diagnosis not present

## 2024-02-15 DIAGNOSIS — H40052 Ocular hypertension, left eye: Secondary | ICD-10-CM | POA: Diagnosis not present

## 2024-02-15 DIAGNOSIS — H2702 Aphakia, left eye: Secondary | ICD-10-CM | POA: Diagnosis not present

## 2024-02-20 ENCOUNTER — Ambulatory Visit: Admitting: Podiatry

## 2024-02-20 ENCOUNTER — Encounter: Payer: Self-pay | Admitting: Podiatry

## 2024-02-20 DIAGNOSIS — B353 Tinea pedis: Secondary | ICD-10-CM | POA: Diagnosis not present

## 2024-02-20 MED ORDER — KETOCONAZOLE 2 % EX CREA: 1.0000 | TOPICAL_CREAM | Freq: Every day | CUTANEOUS | 2 refills | Status: DC

## 2024-02-22 DIAGNOSIS — R7989 Other specified abnormal findings of blood chemistry: Secondary | ICD-10-CM | POA: Diagnosis not present

## 2024-02-22 DIAGNOSIS — I10 Essential (primary) hypertension: Secondary | ICD-10-CM | POA: Diagnosis not present

## 2024-02-22 DIAGNOSIS — N1832 Chronic kidney disease, stage 3b: Secondary | ICD-10-CM | POA: Diagnosis not present

## 2024-02-22 DIAGNOSIS — Z794 Long term (current) use of insulin: Secondary | ICD-10-CM | POA: Diagnosis not present

## 2024-02-22 DIAGNOSIS — E1151 Type 2 diabetes mellitus with diabetic peripheral angiopathy without gangrene: Secondary | ICD-10-CM | POA: Diagnosis not present

## 2024-02-22 NOTE — Progress Notes (Signed)
  Subjective:  Patient ID: IVI GRIFFITH, female    DOB: Aug 03, 1946,  MRN: [id number]  Chief Complaint  Patient presents with   Nail Problem    I want him to look at my toenails about any possible fungus that may be there.  I may have Athlete's Feet.    78 y.o. female presents with the above complaint. History confirmed with patient.   Objective:  Physical Exam: warm, good capillary refill, no trophic changes or ulcerative lesions, normal DP and PT pulses, normal sensory exam, and tinea pedis.  Some onychomycosis of the nails Assessment:   1. Tinea pedis of both feet      Plan:  Patient was evaluated and treated and all questions answered.   Discussed the etiology and treatment options for tinea pedis.  Discussed topical and oral treatment.  Recommended topical treatment with 2% ketoconazole  cream.  This was sent to the patient's pharmacy.  Also discussed appropriate foot hygiene, use of antifungal spray such as Tinactin in shoes, as well as cleaning her foot surfaces such as showers and bathroom floors with bleach.  Discussed topical and oral therapy for onychomycosis.  We discussed the effectiveness of each.  We both agreed on monitoring for now and can consider further therapy if it worsens but topical therapy likely not to be effective  Return if symptoms worsen or fail to improve.

## 2024-03-06 DIAGNOSIS — M545 Low back pain, unspecified: Secondary | ICD-10-CM | POA: Diagnosis not present

## 2024-03-06 DIAGNOSIS — R7989 Other specified abnormal findings of blood chemistry: Secondary | ICD-10-CM | POA: Diagnosis not present

## 2024-03-06 DIAGNOSIS — M79643 Pain in unspecified hand: Secondary | ICD-10-CM | POA: Diagnosis not present

## 2024-03-06 DIAGNOSIS — R070 Pain in throat: Secondary | ICD-10-CM | POA: Diagnosis not present

## 2024-03-26 DIAGNOSIS — I739 Peripheral vascular disease, unspecified: Secondary | ICD-10-CM | POA: Diagnosis not present

## 2024-03-26 DIAGNOSIS — Z23 Encounter for immunization: Secondary | ICD-10-CM | POA: Diagnosis not present

## 2024-03-26 DIAGNOSIS — E1122 Type 2 diabetes mellitus with diabetic chronic kidney disease: Secondary | ICD-10-CM | POA: Diagnosis not present

## 2024-03-26 DIAGNOSIS — E1151 Type 2 diabetes mellitus with diabetic peripheral angiopathy without gangrene: Secondary | ICD-10-CM | POA: Diagnosis not present

## 2024-03-26 DIAGNOSIS — Z1331 Encounter for screening for depression: Secondary | ICD-10-CM | POA: Diagnosis not present

## 2024-03-26 DIAGNOSIS — I25810 Atherosclerosis of coronary artery bypass graft(s) without angina pectoris: Secondary | ICD-10-CM | POA: Diagnosis not present

## 2024-03-26 DIAGNOSIS — M79669 Pain in unspecified lower leg: Secondary | ICD-10-CM | POA: Diagnosis not present

## 2024-03-26 DIAGNOSIS — Z0000 Encounter for general adult medical examination without abnormal findings: Secondary | ICD-10-CM | POA: Diagnosis not present

## 2024-03-26 DIAGNOSIS — Z794 Long term (current) use of insulin: Secondary | ICD-10-CM | POA: Diagnosis not present

## 2024-03-26 DIAGNOSIS — I10 Essential (primary) hypertension: Secondary | ICD-10-CM | POA: Diagnosis not present

## 2024-03-26 DIAGNOSIS — E7800 Pure hypercholesterolemia, unspecified: Secondary | ICD-10-CM | POA: Diagnosis not present

## 2024-03-26 DIAGNOSIS — N1832 Chronic kidney disease, stage 3b: Secondary | ICD-10-CM | POA: Diagnosis not present

## 2024-03-28 DIAGNOSIS — M79669 Pain in unspecified lower leg: Secondary | ICD-10-CM | POA: Diagnosis not present

## 2024-04-10 DIAGNOSIS — Z961 Presence of intraocular lens: Secondary | ICD-10-CM | POA: Diagnosis not present

## 2024-04-10 DIAGNOSIS — H348322 Tributary (branch) retinal vein occlusion, left eye, stable: Secondary | ICD-10-CM | POA: Diagnosis not present

## 2024-04-10 DIAGNOSIS — E113293 Type 2 diabetes mellitus with mild nonproliferative diabetic retinopathy without macular edema, bilateral: Secondary | ICD-10-CM | POA: Diagnosis not present

## 2024-04-10 DIAGNOSIS — H40052 Ocular hypertension, left eye: Secondary | ICD-10-CM | POA: Diagnosis not present

## 2024-04-10 DIAGNOSIS — Z9889 Other specified postprocedural states: Secondary | ICD-10-CM | POA: Diagnosis not present

## 2024-04-10 DIAGNOSIS — H35372 Puckering of macula, left eye: Secondary | ICD-10-CM | POA: Diagnosis not present

## 2024-04-24 DIAGNOSIS — H34832 Tributary (branch) retinal vein occlusion, left eye, with macular edema: Secondary | ICD-10-CM | POA: Diagnosis not present

## 2024-04-25 DIAGNOSIS — R29898 Other symptoms and signs involving the musculoskeletal system: Secondary | ICD-10-CM | POA: Diagnosis not present

## 2024-04-25 DIAGNOSIS — M25552 Pain in left hip: Secondary | ICD-10-CM | POA: Diagnosis not present

## 2024-04-28 DIAGNOSIS — M79669 Pain in unspecified lower leg: Secondary | ICD-10-CM | POA: Diagnosis not present

## 2024-05-02 DIAGNOSIS — R29898 Other symptoms and signs involving the musculoskeletal system: Secondary | ICD-10-CM | POA: Diagnosis not present

## 2024-05-10 DIAGNOSIS — S61209A Unspecified open wound of unspecified finger without damage to nail, initial encounter: Secondary | ICD-10-CM | POA: Diagnosis not present

## 2024-05-29 DIAGNOSIS — H34832 Tributary (branch) retinal vein occlusion, left eye, with macular edema: Secondary | ICD-10-CM | POA: Diagnosis not present

## 2024-05-31 DIAGNOSIS — I739 Peripheral vascular disease, unspecified: Secondary | ICD-10-CM | POA: Diagnosis not present

## 2024-05-31 DIAGNOSIS — M7989 Other specified soft tissue disorders: Secondary | ICD-10-CM | POA: Diagnosis not present

## 2024-05-31 DIAGNOSIS — R252 Cramp and spasm: Secondary | ICD-10-CM | POA: Diagnosis not present

## 2024-07-09 ENCOUNTER — Encounter: Payer: Self-pay | Admitting: Cardiology

## 2024-07-09 NOTE — Telephone Encounter (Signed)
 Patient is following up regarding this matter. She is referring to Rosuvastatin  10 MG once daily. She says she takes it as prescribed. She has been experiencing muscle and joint pain/aches. She says she stopped taking it by accident about 10 days ago and noticed that she felt pretty good, so she remained off it. She would like a call back to discuss further. Please advise.

## 2024-07-10 NOTE — Telephone Encounter (Signed)
 Patient returned call and states she is not able to come to appt on 07/12/24. She is concerned about the potential of bad weather that day.  She also requests she and her husband Andrea [name], D.O.B. 07/23/1941) are scheduled to see Dr. Sheena on the same day. It is easier for them to make only one trip.  No available appts seen, will forward to Dr. Ginette nurse to assist with scheduling appts.  Patient also states she was not able to tolerate rosuvastatin . She had joint pain/aches and went off of statin for 10 days and noted her symptoms resolved. She has not restarted and refuses to take again. Rosuvastatin  removed from medication list and added to allergy list as intolerance. Will also forward to Dr. Sheena to review.

## 2024-07-12 ENCOUNTER — Ambulatory Visit: Attending: Cardiology | Admitting: Cardiology

## 2024-07-12 ENCOUNTER — Encounter: Payer: Self-pay | Admitting: Cardiology

## 2024-07-12 VITALS — BP 142/60 | HR 56 | Ht 59.0 in | Wt 168.4 lb

## 2024-07-12 DIAGNOSIS — I2510 Atherosclerotic heart disease of native coronary artery without angina pectoris: Secondary | ICD-10-CM

## 2024-07-12 DIAGNOSIS — Z794 Long term (current) use of insulin: Secondary | ICD-10-CM

## 2024-07-12 DIAGNOSIS — E119 Type 2 diabetes mellitus without complications: Secondary | ICD-10-CM | POA: Diagnosis not present

## 2024-07-12 DIAGNOSIS — I10 Essential (primary) hypertension: Secondary | ICD-10-CM

## 2024-07-12 DIAGNOSIS — E782 Mixed hyperlipidemia: Secondary | ICD-10-CM | POA: Diagnosis not present

## 2024-07-12 LAB — HEMOGLOBIN A1C
Est. average glucose Bld gHb Est-mCnc: 189 mg/dL
Hgb A1c MFr Bld: 8.2 % — ABNORMAL HIGH (ref 4.8–5.6)

## 2024-07-12 LAB — LIPID PANEL
Chol/HDL Ratio: 2.6 ratio (ref 0.0–4.4)
Cholesterol, Total: 159 mg/dL (ref 100–199)
HDL: 62 mg/dL (ref >39)
LDL Chol Calc (NIH): 68 mg/dL (ref 0–99)
Triglycerides: 177 mg/dL — ABNORMAL HIGH (ref 0–149)
VLDL Cholesterol Cal: 29 mg/dL (ref 5–40)

## 2024-07-12 NOTE — Progress Notes (Signed)
 Cardiology Office Note:    Date:  07/12/2024   ID:  Andrea [name], DOB 1946-02-26, MRN [id number]  PCP:  Rexanne Ingle, MD  Cardiologist:  Dub Huntsman, DO  Electrophysiologist:  None   Referring MD: Rexanne Ingle, MD    I am ok  History of Present Illness:    Andrea [name] is a 78 y.o. female with a hx of CAD s/p CABG 2018 following NSTEMI, DM type II, HTN, HLD, PAD with intermittent claudication, CVA (microvascular stroke 2013.   Since her last visit she has been doing well. She denies chest pain or shortness of breath.   She reports that really taking some of her medications: Ozempic, crestor  stopped recently due to joint pain.   She was a bit upset due to our phone systems not letting her through and has a hard time getting to the appointment due to weather.   Past Medical History:  Diagnosis Date   Anemia 04/30/2017   CHF (congestive heart failure) (HCC)    Diabetes mellitus    Hypertension    Hypokalemia 04/2017   Macular degeneration disease    loss of central vision on left and decreased peripheral vision.    Stroke (HCC) 12/2011   no residuals noted    Past Surgical History:  Procedure Laterality Date   CHOLECYSTECTOMY     CORONARY ARTERY BYPASS GRAFT N/A 04/18/2017   Procedure: CORONARY ARTERY BYPASS GRAFTING (CABG) times four using left internal mammary artery and right leg saphenous vein;  Surgeon: Fleeta Hanford Coy, MD;  Location: [facility] OR;  Service: Open Heart Surgery;  Laterality: N/A;   EYE SURGERY     right   LEFT HEART CATH AND CORONARY ANGIOGRAPHY N/A 04/17/2017   Procedure: LEFT HEART CATH AND CORONARY ANGIOGRAPHY;  Surgeon: Anner Alm ORN, MD;  Location: [facility] INVASIVE CV LAB;  Service: Cardiovascular;  Laterality: N/A;   PUBOVAGINAL SLING N/A 07/31/2019   Procedure: CYSTOSCOPY MID URETHRAL  SLING;  Surgeon: Cam Morene ORN, MD;  Location: [facility] ORS;  Service: Urology;  Laterality: N/A;   REFRACTIVE SURGERY Left    ROBOTIC ASSISTED LAPAROSCOPIC  SACROCOLPOPEXY N/A 07/31/2019   Procedure: XI ROBOTIC ASSISTED LAPAROSCOPIC SACROCOLPOPEXY WIHT SUPRACERVICAL HYSTERECTOMY;  Surgeon: Cam Morene ORN, MD;  Location: [facility] ORS;  Service: Urology;  Laterality: N/A;   ROTATOR CUFF REPAIR     left   TEE WITHOUT CARDIOVERSION N/A 04/18/2017   Procedure: TRANSESOPHAGEAL ECHOCARDIOGRAM (TEE);  Surgeon: Fleeta Hanford, Coy, MD;  Location: [facility] OR;  Service: Open Heart Surgery;  Laterality: N/A;    Current Medications: Current Meds  Medication Sig   ALPRAZolam  (XANAX ) 0.25 MG tablet Take 1 tablet by mouth as needed.   aspirin  EC 81 MG tablet Take 1 tablet (81 mg total) by mouth daily.   carvedilol  (COREG ) 6.25 MG tablet Take 1 tablet (6.25 mg total) by mouth 2 (two) times daily with a meal.   Cholecalciferol  (VITAMIN D ) 2000 UNITS tablet Take 2,000 Units by mouth daily.   FARXIGA 5 MG TABS tablet Take 5 mg by mouth daily.   icosapent  Ethyl (VASCEPA ) 1 g capsule TAKE 1 CAPSULE BY MOUTH 2 TIMES DAILY.   MYRBETRIQ  25 MG TB24 tablet Take 25 mg by mouth daily.   NOVOFINE PLUS 32G X 4 MM MISC UP TO FOUR TIMES DAILY AS DIRECTED   NOVOLOG  FLEXPEN 100 UNIT/ML FlexPen SMARTSIG:12-20 Unit(s) SUB-Q 3 Times Daily   Olmesartan -amLODIPine -HCTZ 40-5-25 MG TABS DISPENSE AS WRITTEN   ONETOUCH VERIO test strip USE TO  TEST BLOOD SUGAR 4 TIMES DAILY   OZEMPIC, 0.25 OR 0.5 MG/DOSE, 2 MG/3ML SOPN Inject 0.5 mg into the skin once a week.   TRESIBA FLEXTOUCH 100 UNIT/ML SOPN FlexTouch Pen Inject 26 Units into the skin every evening.     Allergies:   Rosuvastatin    Social History   Socioeconomic History   Marital status: Married    Spouse name: Not on file   Number of children: 3   Years of education: 16   Highest education level: Not on file  Occupational History   Occupation: Professor    Associate Professor: [employer]  Tobacco Use   Smoking status: Former    Current packs/day: 0.00    Average packs/day: 0.5 packs/day for 20.0 years (10.0 ttl pk-yrs)    Types:  Cigarettes    Start date: 01/02/1977    Quit date: 01/02/1997    Years since quitting: 27.5   Smokeless tobacco: Never  Vaping Use   Vaping status: Never Used  Substance and Sexual Activity   Alcohol use: No   Drug use: No   Sexual activity: Not on file  Other Topics Concern   Not on file  Social History Narrative   Lives with husband and 3 children in a 2 story home.  Retired professor at [employer].  Education: college.    Social Drivers of Corporate Investment Banker Strain: Not on file  Food Insecurity: Not on file  Transportation Needs: Not on file  Physical Activity: Not on file  Stress: Not on file  Social Connections: Not on file     Family History: The patient's family history includes Healthy in her brother; Heart attack in her brother and father; Hypertension in her brother, father, and mother; Stroke in her mother.  ROS:   Review of Systems  Constitution: Negative for decreased appetite, fever and weight gain.  HENT: Negative for congestion, ear discharge, hoarse voice and sore throat.   Eyes: Negative for discharge, redness, vision loss in right eye and visual halos.  Cardiovascular: Negative for chest pain, dyspnea on exertion, leg swelling, orthopnea and palpitations.  Respiratory: Negative for cough, hemoptysis, shortness of breath and snoring.   Endocrine: Negative for heat intolerance and polyphagia.  Hematologic/Lymphatic: Negative for bleeding problem. Does not bruise/bleed easily.  Skin: Negative for flushing, nail changes, rash and suspicious lesions.  Musculoskeletal: Negative for arthritis, joint pain, muscle cramps, myalgias, neck pain and stiffness.  Gastrointestinal: Negative for abdominal pain, bowel incontinence, diarrhea and excessive appetite.  Genitourinary: Negative for decreased libido, genital sores and incomplete emptying.  Neurological: Negative for brief paralysis, focal weakness, headaches and loss of balance.  Psychiatric/Behavioral: Negative  for altered mental status, depression and suicidal ideas.  Allergic/Immunologic: Negative for HIV exposure and persistent infections.    EKGs/Labs/Other Studies Reviewed:    The following studies were reviewed today:   EKG:  The ekg ordered today demonstrates   Recent Labs: No results found for requested labs within last 365 days.  Recent Lipid Panel    Component Value Date/Time   CHOL 110 09/02/2021 0846   TRIG 92 09/02/2021 0846   HDL 58 09/02/2021 0846   CHOLHDL 1.9 09/02/2021 0846   CHOLHDL 3.3 04/17/2017 0014   VLDL 41 (H) 04/17/2017 0014   LDLCALC 34 09/02/2021 0846    Physical Exam:    VS:  BP (!) 142/60   Pulse (!) 56   Ht 4' 11 (1.499 m)   Wt 168 lb 6.4 oz (76.4 kg)  SpO2 98%   BMI 34.01 kg/m     Wt Readings from Last 3 Encounters:  07/12/24 168 lb 6.4 oz (76.4 kg)  07/17/23 162 lb 3.2 oz (73.6 kg)  02/28/23 165 lb (74.8 kg)     GEN: Well nourished, well developed in no acute distress HEENT: Normal NECK: No JVD; No carotid bruits LYMPHATICS: No lymphadenopathy CARDIAC: S1S2 noted,RRR, no murmurs, rubs, gallops RESPIRATORY:  Clear to auscultation without rales, wheezing or rhonchi  ABDOMEN: Soft, non-tender, non-distended, +bowel sounds, no guarding. EXTREMITIES: No edema, No cyanosis, no clubbing MUSCULOSKELETAL:  No deformity  SKIN: Warm and dry NEUROLOGIC:  Alert and oriented x 3, non-focal PSYCHIATRIC:  Normal affect, good insight  ASSESSMENT:    1. Coronary artery disease involving native coronary artery of native heart without angina pectoris   2. Hypertension, unspecified type   3. Diabetes mellitus, type II, insulin  dependent (HCC)   4. Mixed hyperlipidemia    PLAN:    Coronary Artery Disease - no anginal symptoms. Off crestor  due to joint pain but prefers to get follow up labs prior to other medications: Lipitor  , she is also agreeable for PCSK9 inhibitors,   Type 2 Diabetes Mellitus - repeat hba1c has been off some  medications  Hypertension  -  Blood pressure is acceptable, continue with current antihypertensive regimen.    The patient is in agreement with the above plan. The patient left the office in stable condition.  The patient will follow up in   Medication Adjustments/Labs and Tests Ordered: Current medicines are reviewed at length with the patient today.  Concerns regarding medicines are outlined above.  Orders Placed This Encounter  Procedures   Lipid panel   Hemoglobin A1c   EKG 12-Lead   No orders of the defined types were placed in this encounter.   Patient Instructions  Medication Instructions:  Your physician recommends that you continue on your current medications as directed. Please refer to the Current Medication list given to you today.  *If you need a refill on your cardiac medications before your next appointment, please call your pharmacy*  Lab Work: Lipids, HgbA1c If you have labs (blood work) drawn today and your tests are completely normal, you will receive your results only by: MyChart Message (if you have MyChart) OR A paper copy in the mail If you have any lab test that is abnormal or we need to change your treatment, we will call you to review the results.  Follow-Up: At [facility], you and your health needs are our priority.  As part of our continuing mission to provide you with exceptional heart care, our providers are all part of one team.  This team includes your primary Cardiologist (physician) and Advanced Practice Providers or APPs (Physician Assistants and Nurse Practitioners) who all work together to provide you with the care you need, when you need it.  Your next appointment:   1 year(s)  Provider:   Dayden Viverette, DO    Adopting a Healthy Lifestyle.  Know what a healthy weight is for you (roughly BMI <25) and aim to maintain this   Aim for 7+ servings of fruits and vegetables daily   65-80+ fluid ounces of water  or unsweet tea for  healthy kidneys   Limit to max 1 drink of alcohol per day; avoid smoking/tobacco   Limit animal fats in diet for cholesterol and heart health - choose grass fed whenever available   Avoid highly processed foods, and foods high in saturated/trans  fats   Aim for low stress - take time to unwind and care for your mental health   Aim for 150 min of moderate intensity exercise weekly for heart health, and weights twice weekly for bone health   Aim for 7-9 hours of sleep daily   When it comes to diets, agreement about the perfect plan isnt easy to find, even among the experts. Experts at the [facility] [employer] developed an idea known as the Healthy Eating Plate. Just imagine a plate divided into logical, healthy portions.   The emphasis is on diet quality:   Load up on vegetables and fruits - one-half of your plate: Aim for color and variety, and remember that potatoes dont count.   Go for whole grains - one-quarter of your plate: Whole wheat, barley, wheat berries, quinoa, oats, brown rice, and foods made with them. If you want pasta, go with whole wheat pasta.   Protein power - one-quarter of your plate: Fish, chicken, beans, and nuts are all healthy, versatile protein sources. Limit red meat.   The diet, however, does go beyond the plate, offering a few other suggestions.   Use healthy plant oils, such as olive, canola, soy, corn, sunflower and peanut. Check the labels, and avoid partially hydrogenated oil, which have unhealthy trans fats.   If youre thirsty, drink water . Coffee and tea are good in moderation, but skip sugary drinks and limit milk and dairy products to one or two daily servings.   The type of carbohydrate in the diet is more important than the amount. Some sources of carbohydrates, such as vegetables, fruits, whole grains, and beans-are healthier than others.   Finally, stay active  Signed, Keina Mutch, DO  07/12/2024 2:02 PM    [facility]

## 2024-07-12 NOTE — Patient Instructions (Signed)
 Medication Instructions:  Your physician recommends that you continue on your current medications as directed. Please refer to the Current Medication list given to you today.  *If you need a refill on your cardiac medications before your next appointment, please call your pharmacy*  Lab Work: Lipids, HgbA1c If you have labs (blood work) drawn today and your tests are completely normal, you will receive your results only by: MyChart Message (if you have MyChart) OR A paper copy in the mail If you have any lab test that is abnormal or we need to change your treatment, we will call you to review the results.  Follow-Up: At [facility], you and your health needs are our priority.  As part of our continuing mission to provide you with exceptional heart care, our providers are all part of one team.  This team includes your primary Cardiologist (physician) and Advanced Practice Providers or APPs (Physician Assistants and Nurse Practitioners) who all work together to provide you with the care you need, when you need it.  Your next appointment:   1 year(s)  Provider:   Kardie Tobb, DO

## 2024-07-16 ENCOUNTER — Other Ambulatory Visit: Payer: Self-pay | Admitting: Cardiology

## 2024-07-16 ENCOUNTER — Telehealth: Payer: Self-pay | Admitting: Cardiology

## 2024-07-16 NOTE — Telephone Encounter (Signed)
 Patient states she had stopped taking Crestor  d/t it causing leg pain. This was discussed at office visit last week with Dr. Sheena.  Patient would like to know what recommendations Dr. Sheena has based on recent blood work.

## 2024-07-16 NOTE — Telephone Encounter (Signed)
 Pt seen 12/5 by Dr. Sheena

## 2024-07-16 NOTE — Telephone Encounter (Signed)
 Pt c/o medication issue:  1. Name of Medication: Crestor    2. How are you currently taking this medication (dosage and times per day)? Unknown   3. Are you having a reaction (difficulty breathing--STAT)? N/a   4. What is your medication issue? Pt states she was taken off of medication and would like to know if she will be receiving another medication in place of. Please advise

## 2024-07-19 NOTE — Telephone Encounter (Signed)
 Patient is calling back for update. Please advise

## 2024-07-22 NOTE — Telephone Encounter (Signed)
 Called patient back about message. Patient would like to discuss with Dr. Sheena or the pharmacist about taking a shot (Repatha). Patient wanted to get started on the medication before the end of the year, because of insurance. Will reach out to Dr. Sheena and our pharmacy team.

## 2024-07-22 NOTE — Telephone Encounter (Signed)
 Pt returned call to f/u please advise

## 2024-07-22 NOTE — Telephone Encounter (Signed)
 Patient is calling for update, states she hasn't had any medication since 11/24. Please advise

## 2024-07-22 NOTE — Telephone Encounter (Signed)
 Left message for patient to call back

## 2024-07-24 ENCOUNTER — Ambulatory Visit: Payer: Self-pay | Admitting: Cardiology

## 2024-07-25 NOTE — Progress Notes (Signed)
 Referral placed.

## 2024-08-01 ENCOUNTER — Other Ambulatory Visit: Payer: Self-pay | Admitting: Cardiology

## 2024-09-11 ENCOUNTER — Ambulatory Visit: Admitting: Pharmacist Clinician (PhC)/ Clinical Pharmacy Specialist
